# Patient Record
Sex: Female | Born: 1970 | Race: White | Hispanic: No | Marital: Married | Smoking: Former smoker
Health system: Southern US, Community
[De-identification: ages and names within clinical notes are randomized; demographics above are authoritative.]

## PROBLEM LIST (undated history)

## (undated) DIAGNOSIS — K219 Gastro-esophageal reflux disease without esophagitis: Secondary | ICD-10-CM

## (undated) DIAGNOSIS — I10 Essential (primary) hypertension: Secondary | ICD-10-CM

## (undated) DIAGNOSIS — M1990 Unspecified osteoarthritis, unspecified site: Secondary | ICD-10-CM

## (undated) DIAGNOSIS — G4700 Insomnia, unspecified: Secondary | ICD-10-CM

## (undated) DIAGNOSIS — I609 Nontraumatic subarachnoid hemorrhage, unspecified: Secondary | ICD-10-CM

## (undated) DIAGNOSIS — E785 Hyperlipidemia, unspecified: Secondary | ICD-10-CM

## (undated) DIAGNOSIS — G4730 Sleep apnea, unspecified: Secondary | ICD-10-CM

## (undated) DIAGNOSIS — N189 Chronic kidney disease, unspecified: Secondary | ICD-10-CM

## (undated) DIAGNOSIS — Z9889 Other specified postprocedural states: Secondary | ICD-10-CM

## (undated) DIAGNOSIS — F32A Depression, unspecified: Secondary | ICD-10-CM

## (undated) DIAGNOSIS — F329 Major depressive disorder, single episode, unspecified: Secondary | ICD-10-CM

## (undated) DIAGNOSIS — E538 Deficiency of other specified B group vitamins: Secondary | ICD-10-CM

## (undated) DIAGNOSIS — R600 Localized edema: Secondary | ICD-10-CM

## (undated) DIAGNOSIS — C801 Malignant (primary) neoplasm, unspecified: Secondary | ICD-10-CM

## (undated) DIAGNOSIS — F419 Anxiety disorder, unspecified: Secondary | ICD-10-CM

## (undated) DIAGNOSIS — Z973 Presence of spectacles and contact lenses: Secondary | ICD-10-CM

## (undated) DIAGNOSIS — R112 Nausea with vomiting, unspecified: Secondary | ICD-10-CM

## (undated) DIAGNOSIS — L989 Disorder of the skin and subcutaneous tissue, unspecified: Secondary | ICD-10-CM

## (undated) DIAGNOSIS — R51 Headache: Secondary | ICD-10-CM

## (undated) HISTORY — DX: Malignant (primary) neoplasm, unspecified: C80.1

## (undated) HISTORY — DX: Sleep apnea, unspecified: G47.30

---

## 1995-06-17 ENCOUNTER — Encounter: Payer: Self-pay | Admitting: Internal Medicine

## 1998-09-12 ENCOUNTER — Other Ambulatory Visit: Admission: RE | Admit: 1998-09-12 | Discharge: 1998-09-12 | Payer: Self-pay | Admitting: Obstetrics and Gynecology

## 1998-12-21 ENCOUNTER — Encounter: Admission: RE | Admit: 1998-12-21 | Discharge: 1999-03-21 | Payer: Self-pay | Admitting: Gastroenterology

## 1999-09-27 ENCOUNTER — Other Ambulatory Visit: Admission: RE | Admit: 1999-09-27 | Discharge: 1999-09-27 | Payer: Self-pay | Admitting: Obstetrics and Gynecology

## 2000-10-16 ENCOUNTER — Other Ambulatory Visit: Admission: RE | Admit: 2000-10-16 | Discharge: 2000-10-16 | Payer: Self-pay | Admitting: Obstetrics and Gynecology

## 2001-01-21 ENCOUNTER — Encounter: Payer: Self-pay | Admitting: Internal Medicine

## 2002-02-22 ENCOUNTER — Other Ambulatory Visit: Admission: RE | Admit: 2002-02-22 | Discharge: 2002-02-22 | Payer: Self-pay | Admitting: Internal Medicine

## 2002-09-02 HISTORY — PX: LAPAROTOMY: SHX154

## 2003-08-09 ENCOUNTER — Other Ambulatory Visit: Admission: RE | Admit: 2003-08-09 | Discharge: 2003-08-09 | Payer: Self-pay | Admitting: Internal Medicine

## 2004-10-02 ENCOUNTER — Other Ambulatory Visit: Admission: RE | Admit: 2004-10-02 | Discharge: 2004-10-02 | Payer: Self-pay | Admitting: Internal Medicine

## 2004-10-02 ENCOUNTER — Ambulatory Visit: Payer: Self-pay | Admitting: Internal Medicine

## 2005-07-29 ENCOUNTER — Ambulatory Visit: Payer: Self-pay | Admitting: Internal Medicine

## 2005-08-05 ENCOUNTER — Ambulatory Visit: Payer: Self-pay | Admitting: Internal Medicine

## 2005-11-11 ENCOUNTER — Ambulatory Visit: Payer: Self-pay | Admitting: Internal Medicine

## 2005-11-11 ENCOUNTER — Other Ambulatory Visit: Admission: RE | Admit: 2005-11-11 | Discharge: 2005-11-11 | Payer: Self-pay | Admitting: Internal Medicine

## 2005-11-11 ENCOUNTER — Encounter: Payer: Self-pay | Admitting: Internal Medicine

## 2006-04-21 ENCOUNTER — Ambulatory Visit: Payer: Self-pay | Admitting: Family Medicine

## 2007-01-21 ENCOUNTER — Ambulatory Visit: Payer: Self-pay | Admitting: Internal Medicine

## 2007-01-21 LAB — CONVERTED CEMR LAB
ALT: 26 units/L (ref 0–40)
AST: 28 units/L (ref 0–37)
Albumin: 3.7 g/dL (ref 3.5–5.2)
Alkaline Phosphatase: 70 units/L (ref 39–117)
BUN: 9 mg/dL (ref 6–23)
Basophils Absolute: 0 10*3/uL (ref 0.0–0.1)
Basophils Relative: 0.1 % (ref 0.0–1.0)
Bilirubin, Direct: 0.1 mg/dL (ref 0.0–0.3)
CO2: 31 meq/L (ref 19–32)
Calcium: 9.8 mg/dL (ref 8.4–10.5)
Chloride: 105 meq/L (ref 96–112)
Cholesterol: 271 mg/dL (ref 0–200)
Creatinine, Ser: 0.9 mg/dL (ref 0.4–1.2)
Direct LDL: 175.2 mg/dL
Eosinophils Absolute: 0.2 10*3/uL (ref 0.0–0.6)
Eosinophils Relative: 2.1 % (ref 0.0–5.0)
GFR calc Af Amer: 93 mL/min
GFR calc non Af Amer: 77 mL/min
Glucose, Bld: 95 mg/dL (ref 70–99)
HCT: 36.2 % (ref 36.0–46.0)
HDL: 58 mg/dL (ref >39.0)
Hemoglobin: 12.5 g/dL (ref 12.0–15.0)
Lymphocytes Relative: 26.8 % (ref 12.0–46.0)
MCHC: 34.5 g/dL (ref 30.0–36.0)
MCV: 89.7 fL (ref 78.0–100.0)
Monocytes Absolute: 0.6 10*3/uL (ref 0.2–0.7)
Monocytes Relative: 6.8 % (ref 3.0–11.0)
Neutro Abs: 5.3 10*3/uL (ref 1.4–7.7)
Neutrophils Relative %: 64.2 % (ref 43.0–77.0)
Platelets: 366 10*3/uL (ref 150–400)
Potassium: 3.9 meq/L (ref 3.5–5.1)
RBC: 4.04 M/uL (ref 3.87–5.11)
RDW: 11.2 % — ABNORMAL LOW (ref 11.5–14.6)
Sodium: 142 meq/L (ref 135–145)
TSH: 1.33 microintl units/mL (ref 0.35–5.50)
Total Bilirubin: 0.8 mg/dL (ref 0.3–1.2)
Total CHOL/HDL Ratio: 4.7
Total Protein: 7.1 g/dL (ref 6.0–8.3)
Triglycerides: 133 mg/dL (ref 0–149)
VLDL: 27 mg/dL (ref 0–40)
WBC: 8.4 10*3/uL (ref 4.5–10.5)

## 2007-02-03 ENCOUNTER — Ambulatory Visit: Payer: Self-pay | Admitting: Internal Medicine

## 2007-02-06 DIAGNOSIS — E785 Hyperlipidemia, unspecified: Secondary | ICD-10-CM | POA: Insufficient documentation

## 2007-02-06 DIAGNOSIS — I10 Essential (primary) hypertension: Secondary | ICD-10-CM | POA: Insufficient documentation

## 2007-04-01 ENCOUNTER — Ambulatory Visit: Payer: Self-pay | Admitting: Internal Medicine

## 2007-04-01 DIAGNOSIS — F32A Depression, unspecified: Secondary | ICD-10-CM | POA: Insufficient documentation

## 2007-04-01 DIAGNOSIS — F329 Major depressive disorder, single episode, unspecified: Secondary | ICD-10-CM | POA: Insufficient documentation

## 2007-04-21 ENCOUNTER — Telehealth (INDEPENDENT_AMBULATORY_CARE_PROVIDER_SITE_OTHER): Payer: Self-pay | Admitting: *Deleted

## 2007-04-24 ENCOUNTER — Ambulatory Visit: Payer: Self-pay | Admitting: Internal Medicine

## 2007-04-24 LAB — CONVERTED CEMR LAB
Bilirubin Urine: NEGATIVE
Blood in Urine, dipstick: NEGATIVE
Glucose, Urine, Semiquant: NEGATIVE
Ketones, urine, test strip: NEGATIVE
Nitrite: NEGATIVE
Protein, U semiquant: NEGATIVE
Specific Gravity, Urine: 1.01
Urobilinogen, UA: 0.2
WBC Urine, dipstick: NEGATIVE
pH: 7.5

## 2007-04-29 ENCOUNTER — Telehealth: Payer: Self-pay | Admitting: Internal Medicine

## 2007-05-06 ENCOUNTER — Ambulatory Visit: Payer: Self-pay | Admitting: Internal Medicine

## 2007-05-06 LAB — CONVERTED CEMR LAB
ALT: 28 units/L (ref 0–35)
AST: 24 units/L (ref 0–37)
Albumin: 3.4 g/dL — ABNORMAL LOW (ref 3.5–5.2)
Alkaline Phosphatase: 128 units/L — ABNORMAL HIGH (ref 39–117)
Bilirubin, Direct: 0.1 mg/dL (ref 0.0–0.3)
Cholesterol: 256 mg/dL (ref 0–200)
Direct LDL: 155.1 mg/dL
HDL: 57.6 mg/dL (ref >39.0)
Total Bilirubin: 0.5 mg/dL (ref 0.3–1.2)
Total CHOL/HDL Ratio: 4.4
Total Protein: 7.4 g/dL (ref 6.0–8.3)
Triglycerides: 306 mg/dL (ref 0–149)
VLDL: 61 mg/dL — ABNORMAL HIGH (ref 0–40)

## 2007-05-13 ENCOUNTER — Ambulatory Visit: Payer: Self-pay | Admitting: Internal Medicine

## 2007-06-11 ENCOUNTER — Ambulatory Visit: Payer: Self-pay | Admitting: Internal Medicine

## 2007-06-30 ENCOUNTER — Telehealth: Payer: Self-pay | Admitting: Internal Medicine

## 2007-07-01 ENCOUNTER — Telehealth: Payer: Self-pay | Admitting: Internal Medicine

## 2007-08-06 ENCOUNTER — Ambulatory Visit: Payer: Self-pay | Admitting: Internal Medicine

## 2007-08-06 LAB — CONVERTED CEMR LAB
ALT: 20 units/L (ref 0–35)
AST: 25 units/L (ref 0–37)
Albumin: 3.4 g/dL — ABNORMAL LOW (ref 3.5–5.2)
Alkaline Phosphatase: 94 units/L (ref 39–117)
Bilirubin, Direct: 0.1 mg/dL (ref 0.0–0.3)
Cholesterol: 240 mg/dL (ref 0–200)
Direct LDL: 156.1 mg/dL
HDL: 60.1 mg/dL (ref >39.0)
Total Bilirubin: 0.5 mg/dL (ref 0.3–1.2)
Total CHOL/HDL Ratio: 4
Total Protein: 6.9 g/dL (ref 6.0–8.3)
Triglycerides: 184 mg/dL — ABNORMAL HIGH (ref 0–149)
VLDL: 37 mg/dL (ref 0–40)

## 2007-08-18 ENCOUNTER — Ambulatory Visit: Payer: Self-pay | Admitting: Internal Medicine

## 2007-09-01 ENCOUNTER — Ambulatory Visit: Payer: Self-pay | Admitting: Internal Medicine

## 2007-09-03 HISTORY — PX: DILATION AND CURETTAGE OF UTERUS: SHX78

## 2007-10-19 ENCOUNTER — Ambulatory Visit: Payer: Self-pay | Admitting: Internal Medicine

## 2007-10-19 LAB — CONVERTED CEMR LAB
ALT: 35 units/L (ref 0–35)
AST: 28 units/L (ref 0–37)
Albumin: 4 g/dL (ref 3.5–5.2)
Alkaline Phosphatase: 77 units/L (ref 39–117)
Bilirubin, Direct: 0.1 mg/dL (ref 0.0–0.3)
Cholesterol: 175 mg/dL (ref 0–200)
HDL: 50.4 mg/dL (ref >39.0)
LDL Cholesterol: 102 mg/dL — ABNORMAL HIGH (ref 0–99)
Total Bilirubin: 0.6 mg/dL (ref 0.3–1.2)
Total CHOL/HDL Ratio: 3.5
Total Protein: 7.2 g/dL (ref 6.0–8.3)
Triglycerides: 111 mg/dL (ref 0–149)
VLDL: 22 mg/dL (ref 0–40)

## 2007-11-06 ENCOUNTER — Ambulatory Visit: Payer: Self-pay | Admitting: Internal Medicine

## 2008-03-07 ENCOUNTER — Ambulatory Visit: Payer: Self-pay | Admitting: Internal Medicine

## 2008-03-07 LAB — CONVERTED CEMR LAB
ALT: 53 units/L — ABNORMAL HIGH (ref 0–35)
AST: 39 units/L — ABNORMAL HIGH (ref 0–37)
Albumin: 3.8 g/dL (ref 3.5–5.2)
Alkaline Phosphatase: 99 units/L (ref 39–117)
BUN: 8 mg/dL (ref 6–23)
Basophils Absolute: 0 10*3/uL (ref 0.0–0.1)
Basophils Relative: 0.2 % (ref 0.0–1.0)
Bilirubin Urine: NEGATIVE
Bilirubin, Direct: 0.1 mg/dL (ref 0.0–0.3)
CO2: 29 meq/L (ref 19–32)
Calcium: 9.4 mg/dL (ref 8.4–10.5)
Chloride: 104 meq/L (ref 96–112)
Cholesterol: 147 mg/dL (ref 0–200)
Creatinine, Ser: 0.9 mg/dL (ref 0.4–1.2)
Eosinophils Absolute: 0.1 10*3/uL (ref 0.0–0.7)
Eosinophils Relative: 1.7 % (ref 0.0–5.0)
GFR calc Af Amer: 92 mL/min
GFR calc non Af Amer: 76 mL/min
Glucose, Bld: 97 mg/dL (ref 70–99)
Glucose, Urine, Semiquant: NEGATIVE
HCT: 37.9 % (ref 36.0–46.0)
HDL: 47.7 mg/dL (ref >39.0)
Hemoglobin: 13 g/dL (ref 12.0–15.0)
LDL Cholesterol: 81 mg/dL (ref 0–99)
Lymphocytes Relative: 21.6 % (ref 12.0–46.0)
MCHC: 34.4 g/dL (ref 30.0–36.0)
MCV: 89 fL (ref 78.0–100.0)
Monocytes Absolute: 0.4 10*3/uL (ref 0.1–1.0)
Monocytes Relative: 5.1 % (ref 3.0–12.0)
Neutro Abs: 5.2 10*3/uL (ref 1.4–7.7)
Neutrophils Relative %: 71.4 % (ref 43.0–77.0)
Nitrite: NEGATIVE
Platelets: 339 10*3/uL (ref 150–400)
Potassium: 4.1 meq/L (ref 3.5–5.1)
RBC: 4.26 M/uL (ref 3.87–5.11)
RDW: 12.5 % (ref 11.5–14.6)
Sodium: 141 meq/L (ref 135–145)
Specific Gravity, Urine: 1.025
TSH: 2.83 microintl units/mL (ref 0.35–5.50)
Total Bilirubin: 0.6 mg/dL (ref 0.3–1.2)
Total CHOL/HDL Ratio: 3.1
Total Protein: 6.9 g/dL (ref 6.0–8.3)
Triglycerides: 90 mg/dL (ref 0–149)
Urobilinogen, UA: 0.2
VLDL: 18 mg/dL (ref 0–40)
WBC Urine, dipstick: NEGATIVE
WBC: 7.3 10*3/uL (ref 4.5–10.5)
pH: 6

## 2008-03-14 ENCOUNTER — Encounter: Payer: Self-pay | Admitting: Internal Medicine

## 2008-03-14 ENCOUNTER — Other Ambulatory Visit: Admission: RE | Admit: 2008-03-14 | Discharge: 2008-03-14 | Payer: Self-pay | Admitting: Internal Medicine

## 2008-03-14 ENCOUNTER — Ambulatory Visit: Payer: Self-pay | Admitting: Internal Medicine

## 2008-04-13 ENCOUNTER — Telehealth: Payer: Self-pay | Admitting: Internal Medicine

## 2008-04-19 ENCOUNTER — Ambulatory Visit: Payer: Self-pay | Admitting: Internal Medicine

## 2008-04-22 ENCOUNTER — Ambulatory Visit (HOSPITAL_COMMUNITY): Admission: RE | Admit: 2008-04-22 | Discharge: 2008-04-22 | Payer: Self-pay | Admitting: Obstetrics and Gynecology

## 2008-04-22 ENCOUNTER — Encounter (INDEPENDENT_AMBULATORY_CARE_PROVIDER_SITE_OTHER): Payer: Self-pay | Admitting: Obstetrics and Gynecology

## 2008-04-22 ENCOUNTER — Encounter: Payer: Self-pay | Admitting: Internal Medicine

## 2008-05-03 ENCOUNTER — Telehealth: Payer: Self-pay | Admitting: Internal Medicine

## 2008-06-09 ENCOUNTER — Ambulatory Visit: Payer: Self-pay | Admitting: Internal Medicine

## 2008-09-20 ENCOUNTER — Ambulatory Visit: Payer: Self-pay | Admitting: Internal Medicine

## 2008-09-20 LAB — CONVERTED CEMR LAB
ALT: 29 units/L (ref 0–35)
AST: 29 units/L (ref 0–37)
Albumin: 3.7 g/dL (ref 3.5–5.2)
Alkaline Phosphatase: 92 units/L (ref 39–117)
Bilirubin, Direct: 0.1 mg/dL (ref 0.0–0.3)
Cholesterol: 207 mg/dL (ref 0–200)
Direct LDL: 128.6 mg/dL
HDL: 55.6 mg/dL (ref >39.0)
Total Bilirubin: 0.9 mg/dL (ref 0.3–1.2)
Total CHOL/HDL Ratio: 3.7
Total Protein: 7 g/dL (ref 6.0–8.3)
Triglycerides: 122 mg/dL (ref 0–149)
VLDL: 24 mg/dL (ref 0–40)

## 2008-09-28 ENCOUNTER — Ambulatory Visit: Payer: Self-pay | Admitting: Internal Medicine

## 2009-01-11 ENCOUNTER — Ambulatory Visit: Payer: Self-pay | Admitting: Family Medicine

## 2009-02-28 ENCOUNTER — Telehealth: Payer: Self-pay | Admitting: Internal Medicine

## 2009-04-17 ENCOUNTER — Ambulatory Visit: Payer: Self-pay | Admitting: Internal Medicine

## 2009-04-17 LAB — CONVERTED CEMR LAB
ALT: 25 units/L (ref 0–35)
AST: 34 units/L (ref 0–37)
Albumin: 3.6 g/dL (ref 3.5–5.2)
Alkaline Phosphatase: 82 units/L (ref 39–117)
BUN: 11 mg/dL (ref 6–23)
Basophils Absolute: 0 10*3/uL (ref 0.0–0.1)
Basophils Relative: 0.1 % (ref 0.0–3.0)
Bilirubin Urine: NEGATIVE
Bilirubin, Direct: 0.1 mg/dL (ref 0.0–0.3)
Blood in Urine, dipstick: NEGATIVE
CO2: 28 meq/L (ref 19–32)
Calcium: 9.6 mg/dL (ref 8.4–10.5)
Chloride: 106 meq/L (ref 96–112)
Cholesterol: 198 mg/dL (ref 0–200)
Creatinine, Ser: 0.9 mg/dL (ref 0.4–1.2)
Eosinophils Absolute: 0.1 10*3/uL (ref 0.0–0.7)
Eosinophils Relative: 0.8 % (ref 0.0–5.0)
GFR calc non Af Amer: 75.44 mL/min (ref >60)
Glucose, Bld: 109 mg/dL — ABNORMAL HIGH (ref 70–99)
Glucose, Urine, Semiquant: NEGATIVE
HCT: 39.1 % (ref 36.0–46.0)
HDL: 58.7 mg/dL (ref >39.00)
Hemoglobin: 13.3 g/dL (ref 12.0–15.0)
Ketones, urine, test strip: NEGATIVE
LDL Cholesterol: 116 mg/dL — ABNORMAL HIGH (ref 0–99)
Lymphocytes Relative: 21.8 % (ref 12.0–46.0)
Lymphs Abs: 1.8 10*3/uL (ref 0.7–4.0)
MCHC: 33.9 g/dL (ref 30.0–36.0)
MCV: 93.7 fL (ref 78.0–100.0)
Monocytes Absolute: 0.4 10*3/uL (ref 0.1–1.0)
Monocytes Relative: 5.2 % (ref 3.0–12.0)
Neutro Abs: 6.1 10*3/uL (ref 1.4–7.7)
Neutrophils Relative %: 72.1 % (ref 43.0–77.0)
Nitrite: NEGATIVE
Platelets: 292 10*3/uL (ref 150.0–400.0)
Potassium: 5.2 meq/L — ABNORMAL HIGH (ref 3.5–5.1)
Protein, U semiquant: NEGATIVE
RBC: 4.17 M/uL (ref 3.87–5.11)
RDW: 11.1 % — ABNORMAL LOW (ref 11.5–14.6)
Sodium: 140 meq/L (ref 135–145)
Specific Gravity, Urine: 1.015
TSH: 2.41 microintl units/mL (ref 0.35–5.50)
Total Bilirubin: 0.7 mg/dL (ref 0.3–1.2)
Total CHOL/HDL Ratio: 3
Total Protein: 7.2 g/dL (ref 6.0–8.3)
Triglycerides: 119 mg/dL (ref 0.0–149.0)
Urobilinogen, UA: 0.2
VLDL: 23.8 mg/dL (ref 0.0–40.0)
WBC Urine, dipstick: NEGATIVE
WBC: 8.4 10*3/uL (ref 4.5–10.5)
pH: 6.5

## 2009-04-21 ENCOUNTER — Ambulatory Visit: Payer: Self-pay | Admitting: Internal Medicine

## 2009-08-07 ENCOUNTER — Encounter (INDEPENDENT_AMBULATORY_CARE_PROVIDER_SITE_OTHER): Payer: Self-pay | Admitting: *Deleted

## 2009-08-07 ENCOUNTER — Encounter: Payer: Self-pay | Admitting: Internal Medicine

## 2009-08-24 ENCOUNTER — Encounter: Payer: Self-pay | Admitting: Internal Medicine

## 2010-01-16 ENCOUNTER — Ambulatory Visit: Payer: Self-pay | Admitting: Internal Medicine

## 2010-02-13 ENCOUNTER — Ambulatory Visit: Payer: Self-pay | Admitting: Internal Medicine

## 2010-03-02 ENCOUNTER — Telehealth: Payer: Self-pay | Admitting: Internal Medicine

## 2010-04-10 ENCOUNTER — Encounter: Payer: Self-pay | Admitting: Internal Medicine

## 2010-04-18 ENCOUNTER — Ambulatory Visit: Payer: Self-pay | Admitting: Internal Medicine

## 2010-04-18 LAB — CONVERTED CEMR LAB
ALT: 21 units/L (ref 0–35)
AST: 23 units/L (ref 0–37)
Albumin: 3.8 g/dL (ref 3.5–5.2)
Alkaline Phosphatase: 95 units/L (ref 39–117)
BUN: 14 mg/dL (ref 6–23)
Basophils Absolute: 0 10*3/uL (ref 0.0–0.1)
Basophils Relative: 0.4 % (ref 0.0–3.0)
Bilirubin Urine: NEGATIVE
Bilirubin, Direct: 0.1 mg/dL (ref 0.0–0.3)
CO2: 26 meq/L (ref 19–32)
Calcium: 9.2 mg/dL (ref 8.4–10.5)
Chloride: 103 meq/L (ref 96–112)
Cholesterol: 187 mg/dL (ref 0–200)
Creatinine, Ser: 0.9 mg/dL (ref 0.4–1.2)
Eosinophils Absolute: 0.1 10*3/uL (ref 0.0–0.7)
Eosinophils Relative: 0.8 % (ref 0.0–5.0)
GFR calc non Af Amer: 80.13 mL/min (ref >60)
Glucose, Bld: 88 mg/dL (ref 70–99)
Glucose, Urine, Semiquant: NEGATIVE
HCT: 37 % (ref 36.0–46.0)
HDL: 57.7 mg/dL (ref >39.00)
Hemoglobin: 12.9 g/dL (ref 12.0–15.0)
Ketones, urine, test strip: NEGATIVE
LDL Cholesterol: 102 mg/dL — ABNORMAL HIGH (ref 0–99)
Lymphocytes Relative: 20 % (ref 12.0–46.0)
Lymphs Abs: 2 10*3/uL (ref 0.7–4.0)
MCHC: 34.7 g/dL (ref 30.0–36.0)
MCV: 91.3 fL (ref 78.0–100.0)
Monocytes Absolute: 0.4 10*3/uL (ref 0.1–1.0)
Monocytes Relative: 3.7 % (ref 3.0–12.0)
Neutro Abs: 7.5 10*3/uL (ref 1.4–7.7)
Neutrophils Relative %: 75.1 % (ref 43.0–77.0)
Nitrite: NEGATIVE
Platelets: 325 10*3/uL (ref 150.0–400.0)
Potassium: 3.7 meq/L (ref 3.5–5.1)
Protein, U semiquant: NEGATIVE
RBC: 4.06 M/uL (ref 3.87–5.11)
RDW: 12.6 % (ref 11.5–14.6)
Sodium: 140 meq/L (ref 135–145)
Specific Gravity, Urine: 1.02
TSH: 2.57 microintl units/mL (ref 0.35–5.50)
Total Bilirubin: 0.5 mg/dL (ref 0.3–1.2)
Total CHOL/HDL Ratio: 3
Total Protein: 6.8 g/dL (ref 6.0–8.3)
Triglycerides: 136 mg/dL (ref 0.0–149.0)
Urobilinogen, UA: 0.2
VLDL: 27.2 mg/dL (ref 0.0–40.0)
WBC: 10 10*3/uL (ref 4.5–10.5)
pH: 6

## 2010-04-25 ENCOUNTER — Ambulatory Visit: Payer: Self-pay | Admitting: Internal Medicine

## 2010-10-04 NOTE — Assessment & Plan Note (Signed)
Summary: CPX/NO PAP/NJR   Vital Signs:  Patient profile:   40 year old female Menstrual status:  regular LMP:     04/25/2010 Height:      65.5 inches Weight:      214 pounds BMI:     35.20 Pulse rate:   60 / minute Pulse rhythm:   regular Resp:     12 per minute BP sitting:   118 / 80  (left arm) Cuff size:   regular  Vitals Entered By: Gladis Riffle, RN (April 25, 2010 10:13 AM) CC: cpx, no pap--has gyn, labs done Is Patient Diabetic? No LMP (date): 04/25/2010     Menstrual Status regular Enter LMP: 04/25/2010   CC:  cpx, no pap--has gyn, and labs done.  History of Present Illness: CPX  Preventive Screening-Counseling & Management  Alcohol-Tobacco     Smoking Status: never  Current Medications (verified): 1)  Sertraline Hcl 100 Mg Tabs (Sertraline Hcl) .... Take 1 Tablet By Mouth Once A Day 2)  Amlodipine Besy-Benazepril Hcl 5-10 Mg Caps (Amlodipine Besy-Benazepril Hcl) .... One By Mouth Daily 3)  Lipitor 80 Mg Tabs (Atorvastatin Calcium) .... Take 1 Tab By Mouth Daily or As Directed 4)  Apri 0.15-30 Mg-Mcg Tabs (Desogestrel-Ethinyl Estradiol) .... Take 1 Tablet By Mouth Once A Day 5)  Alprazolam 0.25 Mg Tabs (Alprazolam) .... Every Other Day Tab As Needed Anxiety  Allergies (verified): No Known Drug Allergies  Social History: Occupation: Single--engaged Regular exercise-yes  Physical Exam  General:  alert and well-developed.   Head:  normocephalic and atraumatic.   Eyes:  pupils equal and pupils round.   Ears:  R ear normal and L ear normal.   Neck:  supple no adenopathy Chest Wall:  No deformities, masses, or tenderness noted. Lungs:  Normal respiratory effort, chest expands symmetrically. Lungs are clear to auscultation, no crackles or wheezes. Heart:  normal rate and regular rhythm.   Abdomen:  Bowel sounds positive,abdomen soft and non-tender without masses, organomegaly or hernias noted. Msk:  No deformity or scoliosis noted of thoracic or lumbar  spine.   Pulses:  R and L carotid,radial,femoral,dorsalis pedis and posterior tibial pulses are full and equal bilaterally Extremities:  No clubbing, cyanosis, edema, or deformity noted  Neurologic:  alert & oriented X3, cranial nerves II-XII intact, and gait normal.   Skin:  turgor normal and color normal.   Cervical Nodes:  no anterior cervical adenopathy and no posterior cervical adenopathy.   Psych:  normally interactive and good eye contact.     Impression & Recommendations:  Problem # 1:  PREVENTIVE HEALTH CARE (ICD-V70.0) health maint UTD advised weight loss  Problem # 2:  DEPRESSION (ICD-311) she admits to being more anxious increas zoloft side effects discussed Her updated medication list for this problem includes:    Sertraline Hcl 100 Mg Tabs (Sertraline hcl) .Marland Kitchen... 1 and 1/2 by mouth once daily    Alprazolam 0.25 Mg Tabs (Alprazolam) .Marland Kitchen... Take one tablet by mouth every other day as needed anxiety  Complete Medication List: 1)  Sertraline Hcl 100 Mg Tabs (Sertraline hcl) .Marland Kitchen.. 1 and 1/2 by mouth once daily 2)  Amlodipine Besy-benazepril Hcl 5-10 Mg Caps (Amlodipine besy-benazepril hcl) .... One by mouth daily 3)  Lipitor 80 Mg Tabs (Atorvastatin calcium) .... Take 1 tab by mouth daily or as directed 4)  Apri 0.15-30 Mg-mcg Tabs (Desogestrel-ethinyl estradiol) .... Take 1 tablet by mouth once a day 5)  Alprazolam 0.25 Mg Tabs (Alprazolam) .... Take one tablet  by mouth every other day as needed anxiety Prescriptions: ALPRAZOLAM 0.25 MG TABS (ALPRAZOLAM) Take one tablet by mouth every other day as needed anxiety  #30 x 1   Entered and Authorized by:   Birdie Sons MD   Signed by:   Birdie Sons MD on 04/25/2010   Method used:   Print then Give to Patient   RxID:   [id number] SERTRALINE HCL 100 MG TABS (SERTRALINE HCL) 1 and 1/2 by mouth once daily  #135 x 3   Entered and Authorized by:   Birdie Sons MD   Signed by:   Birdie Sons MD on 04/25/2010   Method used:    [facility] to        [facility]  [address] [phone][facility] (retail[facility]       [address] [place], [place]  [address]       Ph: [phone] or [phone]       Fax: [phone]   RxID:   [id number]

## 2010-10-04 NOTE — Assessment & Plan Note (Signed)
Summary: Yeast Infection questions Intestinal Infection questions/nn   Vital Signs:  Patient Profile:   40 Years Old Female Height:     65.75 inches Weight:      205 pounds Temp:     98.4 degrees F Pulse rate:   56 / minute Resp:     14 per minute  Vitals Entered By: Gladis Riffle, RN (April 19, 2008 10:29 AM)                 Chief Complaint:  c/o increased brownish vaginal discharge with cramping left lower abdomen since 8/10--was using metrogel with no relief so stopped--LMP 3 weeks ago normal and has been having clots at night since--also BP 130/90 at home so increased atenolol to one qd .  History of Present Illness: 10 days ago developed vaginal discharge with scant amount of blood---used over the counter monistat meds---3 days--no relief---called and Rx for metrogel called in-- no relief. In addition had intercourse one day last week---mild discomfort followed by bleeding/clots---bleeding has persisted minimally at night. Has some Left sided lower quadrant discomfort. ---started BCPs about 3 weeks ago---no known side effects except for above  Past Medical History: GERD Hyperlipidemia Hypertension Depression  Past Surgical History: open laparotomy  Social History: Occupation: Single Regular exercise-yes  Family History: Family History of CAD Female 1st degree relative--mother Family History Diabetes 1st degree relative father, sister, mother Family History High cholesterol Family History Hypertension Father: alive, HTN, DMII, hyperlipidemia Mother: alive--HTN, MI, CAD, hyperlipidemia, ?anemia, lupus Siblings:0 brothers, 1 sister--DMII, hyperlipidemia   no other complaints in a complete ROS     Updated Prior Medication List: SERTRALINE HCL 100 MG TABS (SERTRALINE HCL) Take 1 tablet by mouth once a day ATENOLOL 25 MG  TABS (ATENOLOL) 1 by mouth once daily LIPITOR 80 MG TABS (ATORVASTATIN CALCIUM) Take 1 tab by mouth daily or as directed RECLIPSEN 0.15-30 MG-MCG  TABS  (DESOGESTREL-ETHINYL ESTRADIOL) Take 1 tablet by mouth once a day  Current Allergies (reviewed today): * NO KNOWN DRUG ALLERGIES GROUP      Physical Exam  General:     Well-developed,well-nourished,in no acute distress; alert,appropriate and cooperative throughout examination Abdomen:     Bowel sounds positive,abdomen soft and non-tender without masses, organomegaly or hernias noted. Genitalia:     clear vaginal discharge. normal introitus, no external lesions, and mucosa pink and moist.   the cervical os demonstrates a large irregular shaped mass. Measuring approximately 4 x 2 cm.    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 She will call for any increased symptoms. Discontinue birth control pills. Will refer to gynecology. Orders: Gynecologic Referral (Gyn)   Complete Medication List: 1)  Sertraline Hcl 100 Mg Tabs (Sertraline hcl) .... Take 1 tablet by mouth once a day 2)  Atenolol 25 Mg Tabs (Atenolol) .Marland Kitchen.. 1 by mouth once daily 3)  Lipitor 80 Mg Tabs (Atorvastatin calcium) .... Take 1 tab by mouth daily or as directed 4)  Reclipsen 0.15-30 Mg-mcg Tabs (Desogestrel-ethinyl estradiol) .... Take 1 tablet by mouth once a day    ]  Appended Document: Yeast Infection questions Intestinal Infection questions/nn

## 2010-10-04 NOTE — Progress Notes (Signed)
Summary: Vaginal discharge, OV?  Phone Note Call from Patient Call back at [phone] (cell)   Caller: Patient triage VM Call For: Swords/[name] Summary of Call: Pt saw Dr Cato Mulligan on 7/13, pap showed atypical cells, OV scheduled in 3 mo for repeat pap.  Pt C/O sx of heavy discharge.  Pt has tried OTC yeast infection meds with no relief.   LMTCB with additional information re: discharge  OV for evaluation?  Initial call taken by: Sid Falcon LPN,  April 13, 2008 10:32 AM  Follow-up for Phone Call        if pap did not show yeast would order metrogel vaginal cream two times a day for 7 d. Follow-up by: Stacie Glaze MD,  April 13, 2008 11:21 AM  Additional Follow-up for Phone Call Additional follow up Details #1::        No yeast present in pap Rx sent electronically, pt informed. Additional Follow-up by: Sid Falcon LPN,  April 13, 2008 11:41 AM    New/Updated Medications: METROGEL-VAGINAL 0.75 %  GEL (METRONIDAZOLE) use vaginal cream two times a day X 7 days   Prescriptions: METROGEL-VAGINAL 0.75 %  GEL (METRONIDAZOLE) use vaginal cream two times a day X 7 days  #70g x 0   Entered by:   Sid Falcon LPN   Authorized by:   Stacie Glaze MD   Signed by:   Sid Falcon LPN on 41/32/4401   Method used:   [facility] to ...       [facility]  [address] [id number]       [address]       [place], [place]  [address]       Ph: [phone] or [phone]       Fax: [phone]   RxID:   [id number]

## 2010-10-04 NOTE — Progress Notes (Signed)
Summary: change of BCP  Phone Note From Pharmacy   Call For: Meric Joye  Summary of Call: request to refill apri Initial call taken by: Gladis Riffle, RN,  February 28, 2009 9:10 AM  Follow-up for Phone Call        Our list states reclipsen for birth control.  She is due cpx, pap in July.  OK to refill apri one month in palce of reclipsen? Follow-up by: Gladis Riffle, RN,  February 28, 2009 9:11 AM  Additional Follow-up for Phone Call Additional follow up Details #1::        ok per dr Aloysius Heinle Additional Follow-up by: Gladis Riffle, RN,  February 28, 2009 11:32 AM

## 2010-10-04 NOTE — Progress Notes (Signed)
Summary: call back to confirm dose.   Phone Note From Pharmacy Call back at [phone]   [facility]  [address] [phone]* Call For: [name]  Summary of Call: simvustatin 40 mg  patient says she should be taking 2 a day and the sig says 1 a day  please clarify Initial call taken by: Roselle Locus,  June 30, 2007 8:15 AM  Follow-up for Phone Call        Dr. Cato Mulligan:  The medication list says Simvastatin 40 mg once daily.  Correct? Follow-up by: Rudy Jew, RN,  June 30, 2007 8:25 AM  Additional Follow-up for Phone Call Additional follow up Details #1::        2 by mouth once daily  Additional Follow-up by: Birdie Sons MD,  June 30, 2007 2:03 PM    Additional Follow-up for Phone Call Additional follow up Details #2::    Left message on pharmacy voice mail that pt is to take Simvastin 40 mg. 2 x daily v/o Dr. Cato Mulligan. Also left a message on pt's voice mail to call us and confirm this dose. Follow-up by: Lynann Beaver CMA,  June 30, 2007 4:16 PM

## 2010-10-04 NOTE — Assessment & Plan Note (Signed)
Summary: cpx and pap/nta   Vital Signs:  Patient Profile:   40 Years Old Female Height:     65.75 inches Weight:      207 pounds Pulse rate:   60 / minute BP sitting:   116 / 88  (left arm)  Vitals Entered By: Gladis Riffle, RN (March 14, 2008 8:54 AM)                 Chief Complaint:  cpx with pap and labs done--c/o irregular menses since off BCP in Jan--discuss right knee.  History of Present Illness: cpx     Prior Medication List:  SERTRALINE HCL 100 MG TABS (SERTRALINE HCL) Take 1 tablet by mouth once a day ATENOLOL 25 MG  TABS (ATENOLOL) 1/2 by mouth once daily LIPITOR 80 MG TABS (ATORVASTATIN CALCIUM) Take 1 tab by mouth daily or as directed   Current Allergies (reviewed today): * NO KNOWN DRUG ALLERGIES GROUP  Past Medical History:    Reviewed history from 04/01/2007 and no changes required:       GERD       Hyperlipidemia       Hypertension       Depression  Past Surgical History:    Reviewed history from 02/06/2007 and no changes required:       open laparotomy   Family History:    Reviewed history and no changes required:       Family History of CAD Female 1st degree relative--mother       Family History Diabetes 1st degree relative father, sister, mother       Family History High cholesterol       Family History Hypertension       Father: alive, HTN, DMII, hyperlipidemia       Mother: alive--HTN, MI, CAD, hyperlipidemia, ?anemia, lupus       Siblings:0 brothers, 1 sister--DMII, hyperlipidemia   Social History:    Reviewed history from 04/24/2007 and no changes required:       Occupation:       Single       Regular exercise-yes    Review of Systems       no other complaints in a complete ROS      Impression & Recommendations:  Problem # 1:  PREVENTIVE HEALTH CARE (ICD-V70.0) she has had irregular periods since stopping BCPs approx 6 months ago  Problem # 2:  HYPERTENSION (ICD-401.9) well controlled Her updated medication list for this  problem includes:    Atenolol 25 Mg Tabs (Atenolol) .Marland Kitchen... 1/2 by mouth once daily  BP today: 116/88 Prior BP: 138/84 (11/06/2007)  Prior 10 Yr Risk Heart Disease: Not enough information (09/01/2007)  Labs Reviewed: Creat: 0.9 (03/07/2008) Chol: 147 (03/07/2008)   HDL: 47.7 (03/07/2008)   LDL: 81 (03/07/2008)   TG: 90 (03/07/2008)   Problem # 3:  HYPERLIPIDEMIA (ICD-272.4) well controlled Her updated medication list for this problem includes:    Lipitor 80 Mg Tabs (Atorvastatin calcium) .Marland Kitchen... Take 1 tab by mouth daily or as directed--she take 1/2 daily  Labs Reviewed: Chol: 147 (03/07/2008)   HDL: 47.7 (03/07/2008)   LDL: 81 (03/07/2008)   TG: 90 (03/07/2008) SGOT: 39 (03/07/2008)   SGPT: 53 (03/07/2008)  Prior 10 Yr Risk Heart Disease: Not enough information (09/01/2007)  Her updated medication list for this problem includes:    Lipitor 80 Mg Tabs (Atorvastatin calcium) .Marland Kitchen... Take 1 tab by mouth daily or as directed   Problem # 4:  GERD (ICD-530.81)  currently doing well on no meds  Problem # 5:  KNEE PAIN, RIGHT (ICD-719.46)  Orders: T-Knee Comp Right 4 Views ([id number])   Complete Medication List: 1)  Sertraline Hcl 100 Mg Tabs (Sertraline hcl) .... Take 1 tablet by mouth once a day 2)  Atenolol 25 Mg Tabs (Atenolol) .... 1/2 by mouth once daily 3)  Lipitor 80 Mg Tabs (Atorvastatin calcium) .... Take 1 tab by mouth daily or as directed 4)  Reclipsen 0.15-30 Mg-mcg Tabs (Desogestrel-ethinyl estradiol) .... Take 1 tablet by mouth once a day   Patient Instructions: 1)  Please schedule a follow-up appointment in 6 months. 2)  lipids 272.4 3)  liver 995.2   Prescriptions: RECLIPSEN 0.15-30 MG-MCG  TABS (DESOGESTREL-ETHINYL ESTRADIOL) Take 1 tablet by mouth once a day  #3 packs x 3   Entered and Authorized by:   Birdie Sons MD   Signed by:   Birdie Sons MD on 03/14/2008   Method used:   Electronically [facility] to ...       [facility]  [address] [id number]       [address]       [place], [place]  [address]       Ph: [phone] or [phone]       Fax: [phone]   RxID:   [id number]  ]Physical Exam General Appearance: well developed, well nourished, no acute distress Eyes: conjunctiva and lids normal, PERRL, EOMI, Ears, Nose, Mouth, Throat: TM clear, nares clear, oral exam WNL Neck: supple, no lymphadenopathy, no thyromegaly, no JVD Respiratory: clear to auscultation and percussion, respiratory effort normal Cardiovascular: regular rate and rhythm, S1-S2, no murmur, rub or gallop, no bruits, peripheral pulses normal and symmetric, no cyanosis, clubbing, edema or varicosities Chest: no scars, masses, tenderness; no asymmetry, skin changes, nipple discharge   Gastrointestinal: soft, non-tender; no hepatosplenomegaly, masses; active bowel sounds all quadrants, no masses, tenderness, hemorrhoids  Genitourinary: no vaginal discharge, lesions; no masses or tenderness, pap done Lymphatic: no cervical, axillary or inguinal adenopathy Musculoskeletal: gait normal, muscle tone and strength WNL, no joint swelling, effusions, discoloration, crepitus  Skin: clear, good turgor, color WNL, no rashes, lesions, or ulcerations Neurologic: normal mental status, normal reflexes, normal strength, sensation, and motion Psychiatric: alert; oriented to person, place and time Other Exam:

## 2010-10-04 NOTE — Assessment & Plan Note (Signed)
Summary: STUFFY, WEAK, FEVER/CJR   Vital Signs:  Patient profile:   40 year old female Temp:     98.5 degrees F oral BP sitting:   130 / 90  (left arm) Cuff size:   regular  Vitals Entered By: Sid Falcon LPN (Jan 11, 2009 1:57 PM) CC: Congestion, feeling lousy, weak X 3 days, ? allergies or sinus infection Is Patient Diabetic? No   History of Present Illness: Patient seen as a work in with three-day history of nasal congestion, fatigue, mild sore throat, mostly nonproductive cough, and mild body aches. She denies any nausea or vomiting. No diarrhea. Patient is nonsmoker. She has hyperlipidemia and hypertension and is treated for these. She has taken NyQuil which has helped her sleep.  Allergies: 1)  * No Known Drug Allergies Group [id number] reviewed for relevance  Review of Systems  The patient denies fever, decreased hearing, hoarseness, prolonged cough, and headaches.    Physical Exam  General:  Well-developed,well-nourished,in no acute distress; alert,appropriate and cooperative throughout examination Head:  Normocephalic and atraumatic without obvious abnormalities. No apparent alopecia or balding. Ears:  External ear exam shows no significant lesions or deformities.  Otoscopic examination reveals clear canals, tympanic membranes are intact bilaterally without bulging, retraction, inflammation or discharge. Hearing is grossly normal bilaterally. Nose:  External nasal examination shows no deformity or inflammation. Nasal mucosa are pink and moist without lesions or exudates. Mouth:  no erythema or exudate noted Neck:  supple no adenopathy Lungs:  clear to auscultation Heart:  regular rhythm and rate   Impression & Recommendations:  Problem # 1:  VIRAL URI (ICD-465.9) Patient will try over-the-counter medication such as Mucinex or Mucinex D. and push plenty of fluids. Tylenol or Advil for symptom relief. Followup p.r.n.  Complete Medication List: 1)  Sertraline Hcl  100 Mg Tabs (Sertraline hcl) .... Take 1 tablet by mouth once a day 2)  Atenolol 25 Mg Tabs (Atenolol) .Marland Kitchen.. 1 by mouth once daily 3)  Lipitor 80 Mg Tabs (Atorvastatin calcium) .... Take 1 tab by mouth daily or as directed 4)  Reclipsen 0.15-30 Mg-mcg Tabs (Desogestrel-ethinyl estradiol) .... Take 1 tablet by mouth once a day

## 2010-10-04 NOTE — Progress Notes (Signed)
Summary: BP questions  Phone Note Call from Patient   Caller: Patient Call For: Birdie Sons MD Summary of Call: [facility] BPs are running 140s/90's, and thinks she needs med adjustment. [phone] Taking Amlodipine 5 mg. daily Initial call taken by: Lynann Beaver CMA,  March 02, 2010 10:01 AM  Follow-up for Phone Call        change to lotrel 10/5 Follow-up by: Stacie Glaze MD,  March 02, 2010 2:10 PM    New/Updated Medications: AMLODIPINE BESY-BENAZEPRIL HCL 5-10 MG CAPS (AMLODIPINE BESY-BENAZEPRIL HCL) one by mouth daily Prescriptions: AMLODIPINE BESY-BENAZEPRIL HCL 5-10 MG CAPS (AMLODIPINE BESY-BENAZEPRIL HCL) one by mouth daily  #30 x 2   Entered by:   Stacie Glaze MD   Authorized by:   Birdie Sons MD   Signed by:   Stacie Glaze MD on 03/02/2010   Method used:   Electronically to        [facility]  [phone]* (retail)       [address]       [place], [place]  [address]       Ph: [phone] or [phone]       Fax: [phone]   RxID:   [id number]  Left message on pt's voice mail.

## 2010-10-04 NOTE — Assessment & Plan Note (Signed)
Summary: 1 month rov/njr   Vital Signs:  Patient profile:   40 year old female Weight:      215 pounds BMI:     35.09 Pulse rate:   74 / minute Pulse rhythm:   regular Resp:     12 per minute BP sitting:   132 / 88  (left arm) Cuff size:   regular  Vitals Entered By: Gladis Riffle, RN (February 13, 2010 11:50 AM) CC: 1 month rov--BP 126/78-140/89 at home Is Patient Diabetic? No Comments forgets night time dose labetalol at times   CC:  1 month rov--BP 126/78-140/89 at home.  History of Present Illness: forgets night time labetolol BPs are good if she remembers meds (110s/60s)  BPs as high as 140/90  no side effects on labetolol  All other systems reviewed and were negative   Preventive Screening-Counseling & Management  Alcohol-Tobacco     Smoking Status: never  Allergies (verified): No Known Drug Allergies  Past History:  Past Medical History: Last updated: 04/01/2007 GERD Hyperlipidemia Hypertension Depression  Past Surgical History: Last updated: 02/06/2007 open laparotomy  Family History: Last updated: 03/14/2008 Family History of CAD Female 1st degree relative--mother Family History Diabetes 1st degree relative father, sister, mother Family History High cholesterol Family History Hypertension Father: alive, HTN, DMII, hyperlipidemia Mother: alive--HTN, MI, CAD, hyperlipidemia, ?anemia, lupus Siblings:0 brothers, 1 sister--DMII, hyperlipidemia   Social History: Last updated: 04/24/2007 Occupation: Single Regular exercise-yes  Risk Factors: Exercise: yes (04/24/2007)  Risk Factors: Smoking Status: never (02/13/2010)  Physical Exam  General:  alert and well-developed.   Neck:  supple no adenopathy Chest Wall:  No deformities, masses, or tenderness noted. Lungs:  Normal respiratory effort, chest expands symmetrically. Lungs are clear to auscultation, no crackles or wheezes. Heart:  Normal rate and regular rhythm. S1 and S2 normal without  gallop, murmur, click, rub or other extra sounds.   Impression & Recommendations:  Problem # 1:  HYPERTENSION (ICD-401.9)  change meds side effects discussed Her updated medication list for this problem includes:    Amlodipine Besylate 5 Mg Tabs (Amlodipine besylate) .Marland Kitchen... 1 by mouth every day  BP today: 132/88 Prior BP: 136/82 (01/16/2010)  Prior 10 Yr Risk Heart Disease: Not enough information (09/01/2007)  Labs Reviewed: K+: 5.2 (04/17/2009) Creat: : 0.9 (04/17/2009)   Chol: 198 (04/17/2009)   HDL: 58.70 (04/17/2009)   LDL: 116 (04/17/2009)   TG: 119.0 (04/17/2009)  Complete Medication List: 1)  Sertraline Hcl 100 Mg Tabs (Sertraline hcl) .... Take 1 tablet by mouth once a day 2)  Amlodipine Besylate 5 Mg Tabs (Amlodipine besylate) .Marland Kitchen.. 1 by mouth every day 3)  Lipitor 80 Mg Tabs (Atorvastatin calcium) .... Take 1 tab by mouth daily or as directed 4)  Apri 0.15-30 Mg-mcg Tabs (Desogestrel-ethinyl estradiol) .... Take 1 tablet by mouth once a day --needs ov for physical and pap in july after 7/13 5)  Alprazolam 0.25 Mg Tabs (Alprazolam) .... Every other day tab as needed anxiety  Patient Instructions: 1)  Please schedule a follow-up appointment in 1 month. see me 4-6 weeks CPX Prescriptions: AMLODIPINE BESYLATE 5 MG  TABS (AMLODIPINE BESYLATE) 1 by mouth every day  #90 x 3   Entered and Authorized by:   Birdie Sons MD   Signed by:   Birdie Sons MD on 02/13/2010   Method used:   [facility]        [facility]  [address] [phone][facility] ([facility]ail)       [address]  [place], [place]  [address]       Ph: [phone] or [phone]       Fax: [phone]   RxID:   [id number]

## 2010-10-04 NOTE — Assessment & Plan Note (Signed)
Summary: fu on medication/njr   Vital Signs:  Patient Profile:   40 Years Old Female Weight:      205 pounds Temp:     98.2 degrees F oral Pulse rate:   50 / minute Pulse rhythm:   regular Resp:     12 per minute BP sitting:   120 / 88  Pt. in pain?   no  Vitals Entered By: Lynann Beaver CMA (April 01, 2007 10:43 AM)               Medications Added EFFEXOR 37.5 MG  TABS (VENLAFAXINE HCL) as directed than 2 by mouth once daily YAZ 3-0.02 MG  TABS (DROSPIRENONE-ETHINYL ESTRADIOL) one by mouth daily ATENOLOL 25 MG  TABS (ATENOLOL) one by mouth daily SIMVASTATIN 20 MG  TABS (SIMVASTATIN) one by mouth daily        Chief Complaint:  to discuss Zoloft dosage and/or changing it.  History of Present Illness: She has a history of depression---she and counselor discussed the possibility of changing or increasing meds    Past Medical History:    GERD    Hyperlipidemia    Hypertension    Depression        Impression & Recommendations:  Problem # 1:  DEPRESSION (ICD-311)  Her updated medication list for this problem includes:    Effexor 37.5 Mg Tabs (Venlafaxine hcl) .Marland Kitchen... As directed than 2 by mouth once daily  see me 6 weeks call for any side effects total time 15 min   Complete Medication List: 1)  Effexor 37.5 Mg Tabs (Venlafaxine hcl) .... As directed than 2 by mouth once daily 2)  Yaz 3-0.02 Mg Tabs (Drospirenone-ethinyl estradiol) .... One by mouth daily 3)  Atenolol 25 Mg Tabs (Atenolol) .... One by mouth daily 4)  Simvastatin 20 Mg Tabs (Simvastatin) .... One by mouth daily   Patient Instructions: 1)  see me 6 weeks 2)  venlafaxine (effexor xr) 37.5 mg by mouth ad for one week than 2 by mouth once daily    Prescriptions: EFFEXOR 37.5 MG  TABS (VENLAFAXINE HCL) as directed than 2 by mouth once daily  #60 x 11   Entered and Authorized by:   Birdie Sons MD   Signed by:   Birdie Sons MD on 04/01/2007   Method used:   Electronically sent to ...    [facility]       [phone]       [place], [place]  [address]       Ph: [phone]       Fax: [phone]   RxID:   [id number]

## 2010-10-04 NOTE — Progress Notes (Signed)
Summary: LMTCB/vaginal itching  Medications Added DIFLUCAN 150 MG TABS (FLUCONAZOLE) once daily       Phone Note Call from Patient Call back at [phone]   Caller: Patient Call For: Dee Paden Summary of Call: PT IS ON  ANTIBIOTICS HAS YEAST INFECTION NEED A CALL IN FOR  RX PLS CALL THX.  Initial call taken by: Shan Levans,  April 29, 2007 11:07 AM  Follow-up for Phone Call        [facility] with name of pharmacy and additional sx information. ..................................................................Marland KitchenSid Falcon LPN  April 29, 2007 12:03 PM   Additional Follow-up for Phone Call Additional follow up Details #1::        [facility] [address] [facility] [place]  sx are itiching, odor, discharge, "always happens when I'm on an antibiotic" may leave a msg on cell phone [phone] Additional Follow-up by: Sid Falcon LPN,  April 29, 2007 1:19 PM    Additional Follow-up for Phone Call Additional follow up Details #2::    rx called in Follow-up by: Birdie Sons MD,  April 29, 2007 4:24 PM  New/Updated Medications: DIFLUCAN 150 MG TABS (FLUCONAZOLE) once daily   Prescriptions: DIFLUCAN 150 MG TABS (FLUCONAZOLE) once daily  #1 x 0   Entered by:   Birdie Sons MD   Authorized by:   Alita Chyle Triage Nurse   Signed by:   Birdie Sons MD on 04/29/2007   Method used:   Electronically [facility] to ...       [facility] [phone] [address]       [address]       [place], [place]  [address]       Ph: [phone] or [phone]       Fax: [phone]   RxID:   [id number]     Appended Document: LMTCB/vaginal itching     Phone Note Call from Patient   Summary of Call: Dr Cato Mulligan ordered Diflucan Initial call taken by: Sid Falcon LPN,  April 29, 2007 4:43 PM  Follow-up for Phone Call        Diflucan 150 mg once daily, #1, 0 [facility] [place]  Pt informed. Follow-up by: Sid Falcon LPN,  April 29, 2007 4:48 PM

## 2010-10-04 NOTE — Progress Notes (Signed)
Summary: refill  Medications Added EFFEXOR 37.5 MG  TABS (VENLAFAXINE HCL) as directed than 2 by mouth once daily EFFEXOR XR 75 MG CP24 (VENLAFAXINE HCL) once daily YAZ 3-0.02 MG  TABS (DROSPIRENONE-ETHINYL ESTRADIOL) one by mouth daily ATENOLOL 25 MG  TABS (ATENOLOL) one by mouth daily ATENOLOL 25 MG  TABS (ATENOLOL) one by mouth daily SIMVASTATIN 40 MG  TABS (SIMVASTATIN) one bid SIMVASTATIN 20 MG  TABS (SIMVASTATIN) one by mouth daily SIMVASTATIN 40 MG  TABS (SIMVASTATIN) once daily CIPRO 500 MG TABS (CIPROFLOXACIN HCL) two times a day CIPRO 500 MG TABS (CIPROFLOXACIN HCL) two times a day METRONIDAZOLE 250 MG TABS (METRONIDAZOLE) three times a day METRONIDAZOLE 250 MG TABS (METRONIDAZOLE) three times a day DIFLUCAN 150 MG TABS (FLUCONAZOLE) once daily DIFLUCAN 150 MG TABS (FLUCONAZOLE) once daily       Phone Note Call from Patient Call back at [phone]   Caller: patinet Call For: Arlee Santosuosso Summary of Call: simvastatin  dr Reichen Hutzler told her to take 2 instead of 1  got refill last time for the amount for 1 per day  [facility] Dowse [facility] out  please call in refill and call her to let her know so she can pick it up  Initial call taken by: Roselle Locus,  July 01, 2007 1:02 PM    New/Updated Medications: SIMVASTATIN 40 MG  TABS (SIMVASTATIN) one bid   Prescriptions: SIMVASTATIN 40 MG  TABS (SIMVASTATIN) one bid  #180 x 3   Entered by:   Lynann Beaver CMA   Authorized by:   Birdie Sons MD   Signed by:   Lynann Beaver CMA on 07/01/2007   Method used:   [facility] ...       [facility]  [id number]       [address]       [place], [place]  [address]       Ph: [phone] or [phone]       Fax: [phone]   RxID:   [id number] SIMVASTATIN 40 MG  TABS (SIMVASTATIN) one bid  #180 x 3   Entered by:   Lynann Beaver CMA   Authorized by:   Birdie Sons MD   Signed by:   Lynann Beaver CMA on 07/01/2007   Method used:   Print then Give  to Patient   RxID:   [id number]

## 2010-10-04 NOTE — Letter (Signed)
Summary: Breast Imaging Follow-up Information/[facility]  Breast Imaging Follow-up Information/[facility]   Imported By: Maryln Gottron 04/12/2010 15:17:52  _____________________________________________________________________  External Attachment:    Type:   Image     Comment:   External Document

## 2010-10-04 NOTE — Assessment & Plan Note (Signed)
Summary: 3 MONTH ROA/JLS  Medications Added EFFEXOR XR 75 MG CP24 (VENLAFAXINE HCL) once daily SIMVASTATIN 40 MG  TABS (SIMVASTATIN) once daily        Vital Signs:  Patient Profile:   40 Years Old Female Weight:      205 pounds Temp:     98.6 degrees F oral Pulse (ortho):   56 / minute Pulse rhythm:   regular Resp:     16 per minute BP sitting:   104 / 74  Vitals Entered By: Lynann Beaver CMA (May 13, 2007 10:13 AM)                 Chief Complaint:  rov.  History of Present Illness:  Follow-Up Visit:hypertension, hyperlipidemia, depression      This is a 40 year old woman who presents for Follow-up visit.  The patient denies chest pain, palpitations, dizziness, syncope, low blood sugar symptoms, high blood sugar symptoms, edema, SOB, DOE, PND, and orthopnea.  Since the last visit the patient notes no new problems or concerns.  The patient reports taking meds as prescribed.  When questioned about possible medication side effects, the patient notes none.      Past Medical History:    Reviewed history from 04/01/2007 and no changes required:       GERD       Hyperlipidemia       Hypertension       Depression  Past Surgical History:    Reviewed history from 02/06/2007 and no changes required:       open laparotomy   Social History:    Reviewed history from 04/24/2007 and no changes required:       Occupation:       Single       Regular exercise-yes    Review of Systems       no other complaints   Physical Exam  General:     Well-developed,well-nourished,in no acute distress; alert,appropriate and cooperative throughout examination Head:     normocephalic.   Ears:     no external deformities.   Nose:     no external deformity.   Neck:     No deformities, masses, or tenderness noted. Chest Wall:     No deformities, masses, or tenderness noted. Lungs:     Normal respiratory effort, chest expands symmetrically. Lungs are clear to auscultation,  no crackles or wheezes. Heart:     Normal rate and regular rhythm. S1 and S2 normal without gallop, murmur, click, rub or other extra sounds.    Impression & Recommendations:  Problem # 1:  HYPERLIPIDEMIA (ICD-272.4)  Her updated medication list for this problem includes:    Simvastatin 40 Mg Tabs (Simvastatin) ..... Once daily  Labs Reviewed: Chol: 256 (05/06/2007)   HDL: 57.6 (05/06/2007)   LDL: DEL (05/06/2007)   TG: 306 (05/06/2007) SGOT: 24 (05/06/2007)   SGPT: 28 (05/06/2007)   Problem # 2:  HYPERTENSION (ICD-401.9) stop atenolol---Bp has been great The following medications were removed from the medication list:    Atenolol 25 Mg Tabs (Atenolol) ..... One by mouth daily  BP today: 104/74 Prior BP: 102/60 (04/24/2007)  Labs Reviewed: Creat: 0.9 (01/21/2007) Chol: 256 (05/06/2007)   HDL: 57.6 (05/06/2007)   LDL: DEL (05/06/2007)   TG: 306 (05/06/2007)   Problem # 3:  ABDOMINAL PAIN, ACUTE (ICD-789.00) resolved  Problem # 4:  DEPRESSION (ICD-311) doing well on effexor Her updated medication list for this problem includes:    Effexor  Xr 75 Mg Cp24 (Venlafaxine hcl) ..... Once daily   Complete Medication List: 1)  Effexor Xr 75 Mg Cp24 (Venlafaxine hcl) .... Once daily 2)  Yaz 3-0.02 Mg Tabs (Drospirenone-ethinyl estradiol) .... One by mouth daily 3)  Simvastatin 40 Mg Tabs (Simvastatin) .... Once daily   Patient Instructions: 1)  Please schedule a follow-up appointment in 3 months. 2)  Hepatic Panel prior to visit, ICD-9: 3)  Lipid Panel prior to visit, ICD-9:    Prescriptions: EFFEXOR XR 75 MG CP24 (VENLAFAXINE HCL) once daily  #90 x 3   Entered and Authorized by:   Birdie Sons MD   Signed by:   Birdie Sons MD on 05/14/2007   Method used:   Historical   RxID:   [id number] SIMVASTATIN 40 MG  TABS (SIMVASTATIN) once daily  #100 x 3   Entered and Authorized by:   Birdie Sons MD   Signed by:   Birdie Sons MD on 05/13/2007   Method used:    [facility] ...       [id number] [address]       [address]       [place], [place]  [address]       Ph: [phone] or [phone]       Fax: [phone]   RxID:   [id number]  ]

## 2010-10-04 NOTE — Assessment & Plan Note (Signed)
Summary: HTN CONCERNS // RS   Vital Signs:  Patient profile:   40 year old female Weight:      218 pounds Temp:     98.5 degrees F oral Pulse rate:   62 / minute Pulse rhythm:   regular Resp:     12 per minute BP sitting:   136 / 82  (left arm) Cuff size:   regular  Vitals Entered By: Gladis Riffle, RN (Jan 16, 2010 11:22 AM) CC: HTN clinic at work 140/100 to 132/86; Nurse practitioner increased atenolol to 25mg  bid on 01/08/10--now feels tired so wants to discuss Is Patient Diabetic? No Comments panic attacks, nurse pract xanax 0.25mg   three times a day as needed  on 01/08/10   CC:  HTN clinic at work 140/100 to 132/86; Nurse practitioner increased atenolol to 25mg  bid on 01/08/10--now feels tired so wants to discuss.  History of Present Illness: pt occasionally takes BP last week took bp because of headache: 140/100 NP at work increased atenolol to two times a day  She admits to occasional panic attacks---NP gave alprazolam---some relief cu  Preventive Screening-Counseling & Management  Alcohol-Tobacco     Smoking Status: never reviewed meds---she has only taken a few alprazolam---has two prescriptions currently  Current Problems (verified): 1)  Preventive Health Care  (ICD-V70.0) 2)  Family History Diabetes 1st Degree Relative  (ICD-V18.0) 3)  Family History of Cad Female 1st Degree Relative <50  (ICD-V17.3) 4)  Depression  (ICD-311) 5)  Hypertension  (ICD-401.9) 6)  Hyperlipidemia  (ICD-272.4)  Current Medications (verified): 1)  Sertraline Hcl 100 Mg Tabs (Sertraline Hcl) .... Take 1 Tablet By Mouth Once A Day 2)  Atenolol 25 Mg  Tabs (Atenolol) .Marland Kitchen.. 1 By Mouth Twice Daily 3)  Lipitor 80 Mg Tabs (Atorvastatin Calcium) .... Take 1 Tab By Mouth Daily or As Directed 4)  Apri 0.15-30 Mg-Mcg Tabs (Desogestrel-Ethinyl Estradiol) .... Take 1 Tablet By Mouth Once A Day --Needs Ov For Physical and Pap in July After 7/13 5)  Alprazolam 0.25 Mg Tabs (Alprazolam) .... Take 1 Tablet By  Mouth Three Times A Day As Needed 6)  Alprazolam 0.25 Mg Tabs (Alprazolam) .... Take 1 Tablet By Mouth Two Times A Day  Allergies: 1)  * No Known Drug Allergies Group  Past History:  Past Medical History: Last updated: 04/01/2007 GERD Hyperlipidemia Hypertension Depression  Past Surgical History: Last updated: 02/06/2007 open laparotomy  Family History: Last updated: 03/14/2008 Family History of CAD Female 1st degree relative--mother Family History Diabetes 1st degree relative father, sister, mother Family History High cholesterol Family History Hypertension Father: alive, HTN, DMII, hyperlipidemia Mother: alive--HTN, MI, CAD, hyperlipidemia, ?anemia, lupus Siblings:0 brothers, 1 sister--DMII, hyperlipidemia   Social History: Last updated: 04/24/2007 Occupation: Single Regular exercise-yes  Risk Factors: Exercise: yes (04/24/2007)  Risk Factors: Smoking Status: never (01/16/2010)  Physical Exam  General:  alert and well-developed.   Head:  normocephalic and atraumatic.   Eyes:  pupils equal and pupils round.   Ears:  R ear normal and L ear normal.   Neck:  supple no adenopathy Chest Wall:  No deformities, masses, or tenderness noted. Lungs:  Normal respiratory effort, chest expands symmetrically. Lungs are clear to auscultation, no crackles or wheezes. Abdomen:  soft and non-tender.  overweight Msk:  No deformity or scoliosis noted of thoracic or lumbar spine.   Pulses:  R radial normal and L radial normal.   Neurologic:  alert & oriented X3 and gait normal.   Skin:  turgor normal and color normal.     Impression & Recommendations:  Problem # 1:  HYPERTENSION (ICD-401.9) fair control continue current medications  Her updated medication list for this problem includes:    Labetalol Hcl 200 Mg Tabs (Labetalol hcl) .Marland Kitchen... 1 tab twice daily  BP today: 136/82 Prior BP: 128/88 (04/21/2009)  Prior 10 Yr Risk Heart Disease: Not enough information  (09/01/2007)  Labs Reviewed: K+: 5.2 (04/17/2009) Creat: : 0.9 (04/17/2009)   Chol: 198 (04/17/2009)   HDL: 58.70 (04/17/2009)   LDL: 116 (04/17/2009)   TG: 119.0 (04/17/2009)  Problem # 2:  HYPERLIPIDEMIA (ICD-272.4) controlled continue current medications  Her updated medication list for this problem includes:    Lipitor 80 Mg Tabs (Atorvastatin calcium) .Marland Kitchen... Take 1 tab by mouth daily or as directed  Labs Reviewed: SGOT: 34 (04/17/2009)   SGPT: 25 (04/17/2009)  Prior 10 Yr Risk Heart Disease: Not enough information (09/01/2007)   HDL:58.70 (04/17/2009), 55.6 (09/20/2008)  LDL:116 (04/17/2009), DEL (09/20/2008)  Chol:198 (04/17/2009), 207 (09/20/2008)  Trig:119.0 (04/17/2009), 122 (09/20/2008)  Complete Medication List: 1)  Sertraline Hcl 100 Mg Tabs (Sertraline hcl) .... Take 1 tablet by mouth once a day 2)  Labetalol Hcl 200 Mg Tabs (Labetalol hcl) .Marland Kitchen.. 1 tab twice daily 3)  Lipitor 80 Mg Tabs (Atorvastatin calcium) .... Take 1 tab by mouth daily or as directed 4)  Apri 0.15-30 Mg-mcg Tabs (Desogestrel-ethinyl estradiol) .... Take 1 tablet by mouth once a day --needs ov for physical and pap in july after 7/13 5)  Alprazolam 0.25 Mg Tabs (Alprazolam) .... Every other day tab as needed anxiety  Patient Instructions: 1)  Please schedule a follow-up appointment in 1 month. Prescriptions: LABETALOL HCL 200 MG TABS (LABETALOL HCL) 1 tab twice daily  #180 x 3   Entered and Authorized by:   Birdie Sons MD   Signed by:   Birdie Sons MD on 01/16/2010   Method used:   [facility]  [address] [phone]       [address]       [place], [place]  [address]       Ph: [phone] or [phone]       Fax: [phone]   RxID:   [id number]

## 2010-10-04 NOTE — Assessment & Plan Note (Signed)
Summary: 6 month rov/njr/RESCH WITH PATIENT/JLS   Vital Signs:  Patient Profile:   40 Years Old Female Height:     65.75 inches Weight:      2087 pounds Temp:     98.3 degrees F Pulse rate:   64 / minute BP sitting:   124 / 88  (left arm)  Vitals Entered By: Gladis Riffle, RN (September 28, 2008 8:34 AM)                 Chief Complaint:  6 month rov and labs done.  History of Present Illness: URI sxs for 2-3 wqeeks---still has nonproductive cough  Mood---doing well on meds, some family stressors  htn---tolerating meds  lipids---doing well on lipitor  Past Medical History: GERD Hyperlipidemia Hypertension Depression  Past Surgical History: open laparotomy Social History: Occupation: Single Regular exercise-yes  Family History: Family History of CAD Female 1st degree relative--mother Family History Diabetes 1st degree relative father, sister, mother Family History High cholesterol Family History Hypertension Father: alive, HTN, DMII, hyperlipidemia Mother: alive--HTN, MI, CAD, hyperlipidemia, ?anemia, lupus Siblings:0 brothers, 1 sister--DMII, hyperlipidemia   no other complaints in a complete ROS     Updated Prior Medication List: SERTRALINE HCL 100 MG TABS (SERTRALINE HCL) Take 1 tablet by mouth once a day ATENOLOL 25 MG  TABS (ATENOLOL) 1 by mouth once daily LIPITOR 80 MG TABS (ATORVASTATIN CALCIUM) Take 1 tab by mouth daily or as directed RECLIPSEN 0.15-30 MG-MCG  TABS (DESOGESTREL-ETHINYL ESTRADIOL) Take 1 tablet by mouth once a day  Current Allergies (reviewed today): * NO KNOWN DRUG ALLERGIES GROUP      Physical Exam  General:     Well-developed,well-nourished,in no acute distress; alert,appropriate and cooperative throughout examination Head:     normocephalic and atraumatic.   Eyes:     pupils equal and pupils round.   Ears:     R ear normal and L ear normal.   Nose:     nasal mucosla erythema Neck:     No deformities, masses, or  tenderness noted. Chest Wall:     No deformities, masses, or tenderness noted. Lungs:     Normal respiratory effort, chest expands symmetrically. Lungs are clear to auscultation, no crackles or wheezes. Abdomen:     Bowel sounds positive,abdomen soft and non-tender without masses, organomegaly or hernias noted. Msk:     No deformity or scoliosis noted of thoracic or lumbar spine.   Pulses:     R radial normal and L radial normal.   Neurologic:     cranial nerves II-XII intact and gait normal.      Impression & Recommendations:  Problem # 1:  HYPERLIPIDEMIA (ICD-272.4) controlled pregnancy risks discussed Her updated medication list for this problem includes:    Lipitor 80 Mg Tabs (Atorvastatin calcium) .Marland Kitchen... Take 1 tab by mouth daily or as directed  Labs Reviewed: Chol: 207 (09/20/2008)   HDL: 55.6 (09/20/2008)   LDL: 128.6 (09/20/2008)   TG: 122 (09/20/2008) SGOT: 29 (09/20/2008)   SGPT: 29 (09/20/2008)  Prior 10 Yr Risk Heart Disease: Not enough information (09/01/2007)   Problem # 2:  HYPERTENSION (ICD-401.9) adequate control Her updated medication list for this problem includes:    Atenolol 25 Mg Tabs (Atenolol) .Marland Kitchen... 1 by mouth once daily  BP today: 124/88 Prior BP: 136/92 (06/09/2008)  Prior 10 Yr Risk Heart Disease: Not enough information (09/01/2007)  Labs Reviewed: Creat: 0.9 (03/07/2008) Chol: 207 (09/20/2008)   HDL: 55.6 (09/20/2008)   LDL: 128.6 (09/20/2008)  TG: 122 (09/20/2008)   Problem # 3:  DEPRESSION (ICD-311) doing well on meds Her updated medication list for this problem includes:    Sertraline Hcl 100 Mg Tabs (Sertraline hcl) .Marland Kitchen... Take 1 tablet by mouth once a day  uri---allfen dm two times a day side effects discussed samples given  Complete Medication List: 1)  Sertraline Hcl 100 Mg Tabs (Sertraline hcl) .... Take 1 tablet by mouth once a day 2)  Atenolol 25 Mg Tabs (Atenolol) .Marland Kitchen.. 1 by mouth once daily 3)  Lipitor 80 Mg Tabs  (Atorvastatin calcium) .... Take 1 tab by mouth daily or as directed 4)  Reclipsen 0.15-30 Mg-mcg Tabs (Desogestrel-ethinyl estradiol) .... Take 1 tablet by mouth once a day   Patient Instructions: 1)  Please schedule a follow-up appointment in 6 months. 2)  cpx

## 2010-10-04 NOTE — Progress Notes (Signed)
Summary: Atenolol question with BP concerns  Phone Note Call from Patient   Caller: Patient Call For: Alano Blasco Summary of Call: BP have been running 136/88, 134/98, feeling fine.  Pt was on Atenelol 25 mg one daily up until 12/08 when it was dropped to 1/2 daily.  Because her BP have been fluxuating, wondering if she should go back to one daily.  Pt does need a new Rx [facility] with new sig as her Rx has expired Initial call taken by: Sid Falcon LPN,  May 03, 2008 11:06 AM  Follow-up for Phone Call        go back to one daily Follow-up by: Birdie Sons MD,  May 03, 2008 5:16 PM  Additional Follow-up for Phone Call Additional follow up Details #1::        LMTCB Additional Follow-up by: Lynann Beaver CMA,  May 04, 2008 10:39 AM      Prescriptions: ATENOLOL 25 MG  TABS (ATENOLOL) 1 by mouth once daily  #30 x 6   Entered by:   Lynann Beaver CMA   Authorized by:   Birdie Sons MD   Signed by:   Lynann Beaver CMA on 05/04/2008   Method used:   [facility]        [facility]  [address] [phone]       [address] [place], [place]  [address]       Ph: [phone] or [phone]       Fax: [phone]   RxID:   [id number]   Appended Document: Atenolol question with BP concerns LMTCB  Appended Document: Atenolol question with BP concerns Called back.  Patient aware.

## 2010-10-04 NOTE — Assessment & Plan Note (Signed)
Summary: intestinal problems/njr 11.30a  Medications Added CIPRO 500 MG TABS (CIPROFLOXACIN HCL) two times a day METRONIDAZOLE 250 MG TABS (METRONIDAZOLE) three times a day        Vital Signs:  Patient Profile:   40 Years Old Female Weight:      206 pounds Temp:     98.4 degrees F oral Pulse rate:   82 / minute Pulse rhythm:   regular Resp:     12 per minute BP sitting:   102 / 60  Vitals Entered By: Lynann Beaver CMA (April 24, 2007 11:40 AM)               Chief Complaint:  lower abdominal pain .  History of Present Illness: She complains of bilateral lower quadrant discomfort for 6 days. in addition she has difficulty starting to urinate. no dysuria but she has a lot of pressure. In addition her last menstrual cycle was prolonged and painful ---2+ weeks (ended approx 5-6 days ago.    Past Medical History:    Reviewed history from 04/01/2007 and no changes required:       GERD       Hyperlipidemia       Hypertension       Depression  Past Surgical History:    Reviewed history from 02/06/2007 and no changes required:       open laparotomy   Social History:    Occupation:    Single    Regular exercise-yes   Risk Factors:  Exercise:  yes    Physical Exam  General:     Well-developed,well-nourished,in no acute distress; alert,appropriate and cooperative throughout examination Head:     Normocephalic and atraumatic without obvious abnormalities. No apparent alopecia or balding. Eyes:     vision grossly intact, pupils equal, and pupils round.   Ears:     R ear normal, L ear normal, and no external deformities.   Nose:     no external deformity and no external erythema.   Mouth:     good dentition.   Neck:     No deformities, masses, or tenderness noted. Chest Wall:     No deformities, masses, or tenderness noted. Lungs:     Normal respiratory effort, chest expands symmetrically. Lungs are clear to auscultation, no crackles or wheezes. Heart:    Normal rate and regular rhythm. S1 and S2 normal without gallop, murmur, click, rub or other extra sounds. Abdomen:     active bowel sounds, sof,nondistended, no masses. she is tender in bilateral lower quadrants.  No rebound or guarding tenderness. Msk:     No deformity or scoliosis noted of thoracic or lumbar spine.   Extremities:     No clubbing, cyanosis, edema, or deformity noted     Impression & Recommendations:  Problem # 1:  ABDOMINAL PAIN, ACUTE (ICD-789.00) unclear etiology. urinalysis is unremarkable.  I think she needs treatment for diverticulitis.  Will treat with Flagyl and Cipro.  All side effects discussed.  Method of taking medications as discussed.  She will call me for any side effects occur to call me if her symptoms do not resolve.  She will call me if her symptoms increase.  Complete Medication List: 1)  Effexor 37.5 Mg Tabs (Venlafaxine hcl) .... As directed than 2 by mouth once daily 2)  Yaz 3-0.02 Mg Tabs (Drospirenone-ethinyl estradiol) .... One by mouth daily 3)  Atenolol 25 Mg Tabs (Atenolol) .... One by mouth daily 4)  Simvastatin 20 Mg Tabs (  Simvastatin) .... One by mouth daily 5)  Cipro 500 Mg Tabs (Ciprofloxacin hcl) .... Two times a day 6)  Metronidazole 250 Mg Tabs (Metronidazole) .... Three times a day  Other Orders: UA Dipstick w/o Micro (81002)     Prescriptions: METRONIDAZOLE 250 MG TABS (METRONIDAZOLE) three times a day  #21 x 0   Entered and Authorized by:   Birdie Sons MD   Signed by:   Birdie Sons MD on 04/24/2007   Method used:   Electron[facility] to ...       [id number] [address]       [address]       [place], [place]  [address]       Ph: [phone] or [phone]       Fax: [phone]   RxID:   [id number] CIPRO 500 MG TABS (CIPROFLOXACIN HCL) two times a day  #14 x 0   Entered and Authorized by:   Birdie Sons MD   Signed by:   Birdie Sons MD on 04/24/2007   Method used:   Electronically sent  to ...       [id number] [address]       [address]       [place], [place]  [address]       Ph: [phone] or [phone]       Fax: [phone]   RxID:   [id number]   Laboratory Results   Urine Tests   Date/Time Reported: April 24, 2007 12:03 PM   Routine Urinalysis   Color: yellow Appearance: Clear Glucose: negative   (Normal Range: Negative) Bilirubin: negative   (Normal Range: Negative) Ketone: negative   (Normal Range: Negative) Spec. Gravity: 1.010   (Normal Range: 1.003-1.035) Blood: negative   (Normal Range: Negative) pH: 7.5   (Normal Range: 5.0-8.0) Protein: negative   (Normal Range: Negative) Urobilinogen: 0.2   (Normal Range: 0-1) Nitrite: negative   (Normal Range: Negative) Leukocyte Esterace: negative   (Normal Range: Negative)    Comments: ..................................................................Marland KitchenWynona Canes, CMA  April 24, 2007 12:03 PM

## 2010-10-04 NOTE — Assessment & Plan Note (Signed)
Summary: st/cough/chest congestion/njr   Vital Signs:  Patient Profile:   40 Years Old Female Weight:      213 pounds Temp:     98 degrees F BP sitting:   112 / 80  (left arm)  Vitals Entered By: Raechel Ache, RN (September 01, 2007 1:24 PM)                 Chief Complaint:  C/o sore throat, head congestion, cough, and achy x 3 days.Marland Kitchen  History of Present Illness: Started with ST and myalgias,  congestion and coughing ( hacking) Now with low grade fevers and chills   Hypertension History:      She denies headache, chest pain, palpitations, dyspnea with exertion, orthopnea, PND, peripheral edema, visual symptoms, neurologic problems, syncope, and side effects from treatment.  Further comments include: atenolol.        Positive major cardiovascular risk factors include hyperlipidemia and hypertension.  Negative major cardiovascular risk factors include female age less than 81 years old and non-tobacco-user status.     Current Allergies (reviewed today): * NO KNOWN DRUG ALLERGIES GROUP Updated/Current Medications (including changes made in today's visit):  YAZ 3-0.02 MG  TABS (DROSPIRENONE-ETHINYL ESTRADIOL) one by mouth daily SERTRALINE HCL 100 MG TABS (SERTRALINE HCL) Take 1 tablet by mouth once a day ATENOLOL 25 MG  TABS (ATENOLOL) 1/2 by mouth once daily LIPITOR 40 MG  TABS (ATORVASTATIN CALCIUM) 1 once daily ZITHROMAX Z-PAK 250 MG  TABS (AZITHROMYCIN) as directed ATUSS DS 30-4-30 MG/5ML  SUSP (PSEUDOEPHED HCL-CPM-DM HBR TAN) two tsp by mouth every 12 hours   Past Medical History:    Reviewed history from 04/01/2007 and no changes required:       GERD       Hyperlipidemia       Hypertension       Depression  Past Surgical History:    Reviewed history from 02/06/2007 and no changes required:       open laparotomy   Family History:    Reviewed history and no changes required:  Social History:    Reviewed history from 04/24/2007 and no changes required:  Occupation:       Single       Regular exercise-yes     Physical Exam  General:     Well-developed,well-nourished,in no acute distress; alert,appropriate and cooperative throughout examination Head:     normocephalic.   Eyes:     pupils equal and pupils round.   Ears:     R ear normal and L ear normal.   Nose:     nasal dischargemucosal pallor.   Mouth:     pharyngeal erythema and posterior lymphoid hypertrophy.   Neck:     No deformities, masses, or tenderness noted. Lungs:     Normal respiratory effort, chest expands symmetrically. Lungs are clear to auscultation, no crackles or wheezes.    Impression & Recommendations:  Problem # 1:  ACUTE PHARYNGITIS (ICD-462)  Her updated medication list for this problem includes:    Zithromax Z-pak 250 Mg Tabs (Azithromycin) .Marland Kitchen... As directed Instructed to complete antibiotics and call if not improved in 48 hours.   Complete Medication List: 1)  Yaz 3-0.02 Mg Tabs (Drospirenone-ethinyl estradiol) .... One by mouth daily 2)  Sertraline Hcl 100 Mg Tabs (Sertraline hcl) .... Take 1 tablet by mouth once a day 3)  Atenolol 25 Mg Tabs (Atenolol) .... 1/2 by mouth once daily 4)  Lipitor 40 Mg Tabs (Atorvastatin calcium) .Marland KitchenMarland KitchenMarland Kitchen 1  once daily 5)  Zithromax Z-pak 250 Mg Tabs (Azithromycin) .... As directed 6)  Atuss Ds 30-4-30 Mg/41ml Susp (Pseudoephed hcl-cpm-dm hbr tan) .... Two tsp by mouth every 12 hours  Hypertension Assessment/Plan:      The patient's hypertensive risk group is category B: At least one risk factor (excluding diabetes) with no target organ damage.  Today's blood pressure is 112/80.  Her blood pressure goal is < 140/90.     Prescriptions: ATUSS DS 30-4-30 MG/5ML  SUSP (PSEUDOEPHED HCL-CPM-DM HBR TAN) two tsp by mouth every 12 hours  #6oz x 0   Entered and Authorized by:   Stacie Glaze MD   Signed by:   Stacie Glaze MD on 09/01/2007   Method used:   Elect[facility] to ...       [id number]       [address]       [place], [place]  [address]       Ph: [phone] or [phone]       Fax: [phone]   RxID:   [id number] ZITHROMAX Z-PAK 250 MG  TABS (AZITHROMYCIN) as directed  #1 x 0   Entered and Authorized by:   Stacie Glaze MD   Signed by:   Stacie Glaze MD on 09/01/2007   Method used:   Electronically sent to ...       [id number]       [address]       [place], [place]  [address]       Ph: [phone] or [phone]       Fax: [phone]   RxID:   [id number]  ]  Appended Document: Orders Update    Clinical Lists Changes  Orders: Added new Service order of Est. Patient Level III ([address]) - Signed

## 2010-10-04 NOTE — Assessment & Plan Note (Signed)
Summary: 2 MONTH ROA/JLS reschedule/mhf rsc per pt/njr   Vital Signs:  Patient Profile:   40 Years Old Female Pulse rate:   78 / minute Resp:     16 per minute BP sitting:   138 / 84                 Chief Complaint:  f/u lipids.  History of Present Illness:  MENORRHAGIA (ICD-626.2)---periods are now normal off of BCPs DEPRESSION (ICD-311)-doing well on meds HYPERTENSION (ICD-401.9)---no sxs on meds HYPERLIPIDEMIA (ICD-272.4)--tolerating lipitor----previously on zocor  Past Medical History: GERD Hyperlipidemia Hypertension Depression  Social History: Occupation: Single Regular exercise-yes  Current Meds:  SERTRALINE HCL 100 MG TABS (SERTRALINE HCL) Take 1 tablet by mouth once a day ATENOLOL 25 MG  TABS (ATENOLOL) 1/2 by mouth once daily LIPITOR 80 MG TABS (ATORVASTATIN CALCIUM) Take 1 tab by mouth daily or as directed (1/2-daily)       Current Allergies: * NO KNOWN DRUG ALLERGIES GROUP     Review of Systems       no other complaints in a complete ROS      Impression & Recommendations:  Problem # 1:  HYPERTENSION (ICD-401.9) Bp today more elevated---continue meds.  Her updated medication list for this problem includes:    Atenolol 25 Mg Tabs (Atenolol) .Marland Kitchen... 1/2 by mouth once daily  Prior BP: 112/80 (09/01/2007)  Prior 10 Yr Risk Heart Disease: Not enough information (09/01/2007)  Labs Reviewed: Creat: 0.9 (01/21/2007) Chol: 175 (10/19/2007)   HDL: 50.4 (10/19/2007)   LDL: 102 (10/19/2007)   TG: 111 (10/19/2007)  BP today: 138/84 Prior BP: 112/80 (09/01/2007)  Prior 10 Yr Risk Heart Disease: Not enough information (09/01/2007)  Labs Reviewed: Creat: 0.9 (01/21/2007) Chol: 175 (10/19/2007)   HDL: 50.4 (10/19/2007)   LDL: 102 (10/19/2007)   TG: 111 (10/19/2007)   Problem # 2:  HYPERLIPIDEMIA (ICD-272.4)  Her updated medication list for this problem includes:    Lipitor 80 Mg Tabs (Atorvastatin calcium) .Marland Kitchen... Take 1 tab by mouth daily or  as directed  Labs Reviewed: Chol: 175 (10/19/2007)   HDL: 50.4 (10/19/2007)   LDL: 102 (10/19/2007)   TG: 111 (10/19/2007) SGOT: 28 (10/19/2007)   SGPT: 35 (10/19/2007)  Prior 10 Yr Risk Heart Disease: Not enough information (09/01/2007)   Complete Medication List: 1)  Sertraline Hcl 100 Mg Tabs (Sertraline hcl) .... Take 1 tablet by mouth once a day 2)  Atenolol 25 Mg Tabs (Atenolol) .... 1/2 by mouth once daily 3)  Lipitor 80 Mg Tabs (Atorvastatin calcium) .... Take 1 tab by mouth daily or as directed   Patient Instructions: 1)  Please schedule a follow-up appointment in 4 months. CPX    Prescriptions: LIPITOR 80 MG TABS (ATORVASTATIN CALCIUM) Take 1 tab by mouth daily or as directed  #90 x 3   Entered and Authorized by:   Birdie Sons MD   Signed by:   Birdie Sons MD on 11/06/2007   Method used:   Electronically sent to ...       [id number]       [address]       [place], [place]  [address]       Ph: [phone] or [phone]       Fax: [phone]   RxID:   [id number]  ]

## 2010-10-04 NOTE — Assessment & Plan Note (Signed)
Summary: cpx/no pap/njr rsc with pt from bump/mhf   Vital Signs:  Patient profile:   40 year old female Height:      65.75 inches Weight:      216 pounds BMI:     35.26 Temp:     98.4 degrees F oral Pulse rate:   70 / minute Pulse rhythm:   regular BP sitting:   128 / 88  (left arm) Cuff size:   regular  Vitals Entered By: Army Fossa CMA (April 21, 2009 9:02 AM) CC: cpx, no pap   CC:  cpx and no pap.  History of Present Illness: cpx  Current Problems (verified): 1)  Preventive Health Care  (ICD-V70.0) 2)  Family History Diabetes 1st Degree Relative  (ICD-V18.0) 3)  Family History of Cad Female 1st Degree Relative <50  (ICD-V17.3) 4)  Depression  (ICD-311) 5)  Hypertension  (ICD-401.9) 6)  Hyperlipidemia  (ICD-272.4)  Current Medications (verified): 1)  Sertraline Hcl 100 Mg Tabs (Sertraline Hcl) .... Take 1 Tablet By Mouth Once A Day 2)  Atenolol 25 Mg  Tabs (Atenolol) .Marland Kitchen.. 1 By Mouth Once Daily 3)  Lipitor 80 Mg Tabs (Atorvastatin Calcium) .... Take 1 Tab By Mouth Daily or As Directed 4)  Apri 0.15-30 Mg-Mcg Tabs (Desogestrel-Ethinyl Estradiol) .... Take 1 Tablet By Mouth Once A Day --Needs Ov For Physical and Pap in July After 7/13  Allergies (verified): 1)  * No Known Drug Allergies Group  Past History:  Past Medical History: Last updated: 04/01/2007 GERD Hyperlipidemia Hypertension Depression  Past Surgical History: Last updated: 02/06/2007 open laparotomy  Family History: Last updated: 03/14/2008 Family History of CAD Female 1st degree relative--mother Family History Diabetes 1st degree relative father, sister, mother Family History High cholesterol Family History Hypertension Father: alive, HTN, DMII, hyperlipidemia Mother: alive--HTN, MI, CAD, hyperlipidemia, ?anemia, lupus Siblings:0 brothers, 1 sister--DMII, hyperlipidemia   Social History: Last updated: 04/24/2007 Occupation: Single Regular exercise-yes  Risk Factors: Exercise: yes  (04/24/2007)  Risk Factors: Smoking Status: never (02/06/2007)  Review of Systems       All other systems reviewed and were negative   Physical Exam  General:  Well-developed,well-nourished,in no acute distress; alert,appropriate and cooperative throughout examination Head:  atraumatic and no abnormalities observed.   Eyes:  pupils equal and pupils round.   Ears:  R ear normal and L ear normal.   Neck:  supple no adenopathy Chest Wall:  No deformities, masses, or tenderness noted. Lungs:  Normal respiratory effort, chest expands symmetrically. Lungs are clear to auscultation, no crackles or wheezes. Heart:  Normal rate and regular rhythm. S1 and S2 normal without gallop, murmur, click, rub or other extra sounds. Abdomen:  Bowel sounds positive,abdomen soft and non-tender without masses, organomegaly or hernias noted.  overweight Genitalia:  done at [facility] Msk:  No deformity or scoliosis noted of thoracic or lumbar spine.   Pulses:  R radial normal and L radial normal.   Neurologic:  cranial nerves II-XII intact and gait normal.   Skin:  Intact without suspicious lesions or rashes Cervical Nodes:  No lymphadenopathy noted Psych:  Cognition and judgment appear intact. Alert and cooperative with normal attention span and concentration. No apparent delusions, illusions, hallucinations   Impression & Recommendations:  Problem # 1:  PREVENTIVE HEALTH CARE (ICD-V70.0) health maint UTD  She understands need for weight loss diet, exercise discussed---she understands  Problem # 2:  HYPERTENSION (ICD-401.9)  Her updated medication list for this problem includes:    Atenolol 25 Mg  Tabs (Atenolol) .Marland Kitchen... 1 by mouth once daily  BP today: 128/88 Prior BP: 130/90 (01/11/2009)  Prior 10 Yr Risk Heart Disease: Not enough information (09/01/2007)  Labs Reviewed: K+: 5.2 (04/17/2009) Creat: : 0.9 (04/17/2009)   Chol: 198 (04/17/2009)   HDL: 58.70 (04/17/2009)   LDL: 116 (04/17/2009)   TG:  119.0 (04/17/2009)  Problem # 3:  HYPERLIPIDEMIA (ICD-272.4) she will continue meds pregnancy in context of meds discussed she has no plans for preganncy in the next year Her updated medication list for this problem includes:    Lipitor 80 Mg Tabs (Atorvastatin calcium) .Marland Kitchen... Take 1 tab by mouth daily or as directed  Labs Reviewed: SGOT: 34 (04/17/2009)   SGPT: 25 (04/17/2009)  Prior 10 Yr Risk Heart Disease: Not enough information (09/01/2007)   HDL:58.70 (04/17/2009), 55.6 (09/20/2008)  LDL:116 (04/17/2009), DEL (09/20/2008)  Chol:198 (04/17/2009), 207 (09/20/2008)  Trig:119.0 (04/17/2009), 122 (09/20/2008)  Complete Medication List: 1)  Sertraline Hcl 100 Mg Tabs (Sertraline hcl) .... Take 1 tablet by mouth once a day 2)  Atenolol 25 Mg Tabs (Atenolol) .Marland Kitchen.. 1 by mouth once daily 3)  Lipitor 80 Mg Tabs (Atorvastatin calcium) .... Take 1 tab by mouth daily or as directed 4)  Apri 0.15-30 Mg-mcg Tabs (Desogestrel-ethinyl estradiol) .... Take 1 tablet by mouth once a day --needs ov for physical and pap in july after 7/13 Prescriptions: LIPITOR 80 MG TABS (ATORVASTATIN CALCIUM) Take 1 tab by mouth daily or as directed  #90 x 3   Entered and Authorized by:   Birdie Sons MD   Signed by:   Birdie Sons MD on 04/21/2009   Method used:   [facility] to        [phone][facility] ([facility]ail[facility]       [address] [place], [place]  [address]       Ph: [phone] or [phone]       Fax: [phone]   RxID:   [id number] ATENOLOL 25 MG  TABS (ATENOLOL) 1 by mouth once daily  #90 x 3   Entered and Authorized by:   Birdie Sons MD   Signed by:   Birdie Sons MD on 04/21/2009   Method used:   [facility] to        [facility]  [address] [phone][facility] ([facility]ail)       [address] [place], [place]  [address]       Ph: [phone] or [phone]       Fax: [phone]   RxID:   [id number] SERTRALINE HCL 100 MG TABS (SERTRALINE HCL) Take 1 tablet by mouth  once a day  #90 x 3   Entered and Authorized by:   Birdie Sons MD   Signed by:   Birdie Sons MD on 04/21/2009   Method used:   [facility]        [phone][facility]       [address] [place], [place]  [address]       Ph: [phone] or [phone]       Fax: [phone]   RxID:   [id number]

## 2010-10-04 NOTE — Assessment & Plan Note (Signed)
Summary: congestion in chest and coughing/mhf   Vital Signs:  Patient Profile:   40 Years Old Female Height:     65.75 inches Temp:     98.3 degrees F Pulse rate:   56 / minute BP sitting:   136 / 92  (left arm)  Vitals Entered By: Gladis Riffle, RN (June 09, 2008 10:49 AM)                 Chief Complaint:  c/o chest congestion, cough, sinus congestion x 2 weeks, and URI symptoms.  History of Present Illness:  URI Symptoms      This is a 40 year old woman who presents with URI symptoms.  The patient reports nasal congestion, purulent nasal discharge, and dry cough, but denies clear nasal discharge, sore throat, productive cough, earache, and sick contacts.  Associated symptoms include fever.  The patient denies stiff neck, dyspnea, and wheezing.  The patient also reports headache.  The patient denies itchy watery eyes, itchy throat, sneezing, seasonal symptoms, response to antihistamine, muscle aches, and severe fatigue.  Risk factors for Strep sinusitis include unilateral facial pain.  The patient denies the following risk factors for Strep sinusitis: unilateral nasal discharge, poor response to decongestant, double sickening, tooth pain, Strep exposure, tender adenopathy, and absence of cough.   Past Medical History: GERD Hyperlipidemia Hypertension Depression  Past Surgical History: open laparotomy Social History: Occupation: Single Regular exercise-yes  Family History: Family History of CAD Female 1st degree relative--mother Family History Diabetes 1st degree relative father, sister, mother Family History High cholesterol Family History Hypertension Father: alive, HTN, DMII, hyperlipidemia Mother: alive--HTN, MI, CAD, hyperlipidemia, ?anemia, lupus Siblings:0 brothers, 1 sister--DMII, hyperlipidemia   no other complaints in a complete ROS     Prior Medication List:  SERTRALINE HCL 100 MG TABS (SERTRALINE HCL) Take 1 tablet by mouth once a day ATENOLOL 25 MG  TABS  (ATENOLOL) 1 by mouth once daily LIPITOR 80 MG TABS (ATORVASTATIN CALCIUM) Take 1 tab by mouth daily or as directed RECLIPSEN 0.15-30 MG-MCG  TABS (DESOGESTREL-ETHINYL ESTRADIOL) Take 1 tablet by mouth once a day   Current Allergies (reviewed today): * NO KNOWN DRUG ALLERGIES GROUP      Physical Exam  General:     Well-developed,well-nourished,in no acute distress; alert,appropriate and cooperative throughout examination Head:     normocephalic and atraumatic.   Eyes:     pupils equal and pupils round.   Nose:     nasal mucosla erythema Neck:     No deformities, masses, or tenderness noted. Lungs:     Normal respiratory effort, chest expands symmetrically. Lungs are clear to auscultation, no crackles or wheezes. Heart:     Normal rate and regular rhythm. S1 and S2 normal without gallop, murmur, click, rub or other extra sounds.    Impression & Recommendations:  Problem # 1:  SINUSITIS- ACUTE-NOS (ICD-461.9) needs treatment call if sxs persist side effects discussed Her updated medication list for this problem includes:    Doxycycline Hyclate 100 Mg Caps (Doxycycline hyclate) .Marland Kitchen... Take 1 tab twice a day   Complete Medication List: 1)  Sertraline Hcl 100 Mg Tabs (Sertraline hcl) .... Take 1 tablet by mouth once a day 2)  Atenolol 25 Mg Tabs (Atenolol) .Marland Kitchen.. 1 by mouth once daily 3)  Lipitor 80 Mg Tabs (Atorvastatin calcium) .... Take 1 tab by mouth daily or as directed 4)  Reclipsen 0.15-30 Mg-mcg Tabs (Desogestrel-ethinyl estradiol) .... Take 1 tablet by mouth once a  day 5)  Doxycycline Hyclate 100 Mg Caps (Doxycycline hyclate) .... Take 1 tab twice a day 6)  Diflucan 150 Mg Tabs (Fluconazole) .... Once daily    Prescriptions: DIFLUCAN 150 MG TABS (FLUCONAZOLE) once daily  #1 x 0   Entered and Authorized by:   Birdie Sons MD   Signed by:   Birdie Sons MD on 06/09/2008   Method used:   Electronically to        [facility]  [phone]* (retail)        [address]       [place], [place]  [address]       Ph: [phone] or [phone]       Fax: [phone]   RxID:   [id number] DOXYCYCLINE HYCLATE 100 MG CAPS (DOXYCYCLINE HYCLATE) Take 1 tab twice a day  #20 x 0   Entered and Authorized by:   Birdie Sons MD   Signed by:   Birdie Sons MD on 06/09/2008   Method used:   Electronically to        [facility]  [phone]* (retail)       [address]       [place], [place]  [address]       Ph: [phone] or [phone]       Fax: [phone]   RxID:   [id number]  ]

## 2010-10-04 NOTE — Procedures (Signed)
Summary: Gastroenterology EGD  Gastroenterology EGD   Imported By: Christie Nottingham 11/11/2007 09:36:32  _____________________________________________________________________  External Attachment:    Type:   Image     Comment:   External Document

## 2010-10-04 NOTE — Progress Notes (Signed)
Summary: enema for effexor constipation? unidentified line - LMTCB   Phone Note Call from Patient Call back at [facility] Phone [phone]   Caller: Patient Call For: SWORDS Summary of Call: SIDE EFFECT FROM EFFEXOR: CONSTIPATION AND ABD. PAIN CAN SHE DO A ENEMA OR WHAT SHOULD SHE DO? [facility] Initial call taken by: Barnie Mort,  April 21, 2007 11:12 AM  Follow-up for Phone Call        Abd cramping since Sat.  Yesterday took OTC [employer] laxative.  Has increased veg & fruit 1 day & will + increased fluids & exercise.  Takes a daily fiber pill.  But for abd cramping and pressure, thinks an enema would help this time.  Hemorrhoids are also flared.  Will use Prep H.Discussed all.  Enema? Follow-up by: Rudy Jew, RN,  April 21, 2007 11:28 AM  Additional Follow-up for Phone Call Additional follow up Details #1::        ok to use enema Additional Follow-up by: Birdie Sons MD,  April 21, 2007 3:18 PM    Additional Follow-up for Phone Call Additional follow up Details #2::    Advice called to patient - left message- okay.  Line not identified. Follow-up by: Rudy Jew, RN,  April 21, 2007 4:07 PM  Additional Follow-up for Phone Call Additional follow up Details #3:: Details for Additional Follow-up Action Taken: PATIENT RETURNING CALL FROM NURSE   *****PLEASE CALL [phone]******  CALLED PATIENT AT 4:55P.M. TO MAKE SURE SHE RCVD MESS. AND TOLD HER IF SHE DOES NOT HAVE ANY RELIEF TO CALL EARLY THURS. MORNING/NTA  Additional Follow-up by: Barnie Mort,  April 22, 2007 9:00 AM

## 2010-10-04 NOTE — Assessment & Plan Note (Signed)
Summary: 3 MNTH ROA-SMM/PT RESCD/CCM  Medications Added SIMVASTATIN 40 MG  TABS (SIMVASTATIN) two once daily ATENOLOL-CHLORTHALIDONE 50-25 MG TABS (ATENOLOL-CHLORTHALIDONE) 1/2 once daily SERTRALINE HCL 100 MG TABS (SERTRALINE HCL) Take 1 tablet by mouth once a day ATENOLOL 25 MG  TABS (ATENOLOL)  ATENOLOL 25 MG  TABS (ATENOLOL) 1/2 by mouth once daily LIPITOR 40 MG  TABS (ATORVASTATIN CALCIUM)       Allergies Added: * NO KNOWN DRUG ALLERGIES GROUP  Vital Signs:  Patient Profile:   40 Years Old Female Weight:      206 pounds Temp:     97.9 degrees F Pulse rate:   62 / minute BP sitting:   106 / 70  (left arm)  Vitals Entered By: Gladis Riffle, RN (August 18, 2007 10:38 AM)                 Chief Complaint:  3 month rov--states zoloft is working--put self back on BP med due to systolic running in 90's--needs refill.  History of Present Illness: Pt. here for follow up of recent changes in medications. 1. lipids  simvastatin dose was doubled to 80mg  at last visit.  LDL still 150's  no muscle pains or other side effects. 2. HTN patient had consistent home diastolic pressures over 90 so started back on 1/2 of her previous atenolol dose. 3. Patient switched from effexor back to zoloft 100 qday due to weight gain on effexor.  No mood, sleep, appetite, concentration, or other depression symptoms. 4. Patient would like to change OCPs.  Several months ago, she changed from orthonova to yaz due to cramping.  Since then, she has been having menorrhagia with periods lasting 2 weeks out of the month.  Current Allergies: * NO KNOWN DRUG ALLERGIES GROUP  Past Medical History:    Reviewed history from 04/01/2007 and no changes required:       GERD       Hyperlipidemia       Hypertension       Depression  Past Surgical History:    Reviewed history from 02/06/2007 and no changes required:       open laparotomy   Social History:    Reviewed history from 04/24/2007 and no changes  required:       Occupation:       Single       Regular exercise-yes    Review of Systems       no other complaints in a complete ROS    Physical Exam  General:     alert, well-developed, and well-nourished.   Head:     normocephalic and atraumatic.   Neck:     No deformities, masses, or tenderness noted. Lungs:     Normal respiratory effort, chest expands symmetrically. Lungs are clear to auscultation, no crackles or wheezes. Heart:     Normal rate and regular rhythm. S1 and S2 normal without gallop, murmur, click, rub or other extra sounds. Pulses:     R radial normal and L radial normal.   Extremities:     No clubbing, cyanosis, edema, or deformity noted with normal full range of motion of all joints.      Impression & Recommendations:  Problem # 1:  DEPRESSION (ICD-311) Patient is doing well on her sertraline 100qday.  Will continue.  The following medications were removed from the medication list:    Effexor Xr 75 Mg Cp24 (Venlafaxine hcl) ..... Once daily  Her updated medication list for this problem  includes:    Sertraline Hcl 100 Mg Tabs (Sertraline hcl) .Marland Kitchen... Take 1 tablet by mouth once a day   Problem # 2:  HYPERTENSION (ICD-401.9) Patient's BP was excellent today on 1/2 of a 25mg  atenolol qday.  Will continue this.  Her updated medication list for this problem includes:    Atenolol 25 Mg Tabs (Atenolol)  The following medications were removed from the medication list:    Atenolol-chlorthalidone 50-25 Mg Tabs (Atenolol-chlorthalidone) .Marland Kitchen... 1/2 once daily  Her updated medication list for this problem includes:    Atenolol 25 Mg Tabs (Atenolol) .Marland Kitchen... 1/2 by mouth once daily   Problem # 3:  HYPERLIPIDEMIA (ICD-272.4) Because patient's lipids failed to improve on maximal dose of simvastatin, and her insurrance now covers lipitor we will start her back on lipitor 40 and see her in 2 months for labs.  The following medications were removed from the  medication list:    Simvastatin 40 Mg Tabs (Simvastatin) .Marland Kitchen..Marland Kitchen Two once daily  Her updated medication list for this problem includes:    Lipitor 40 Mg Tabs (Atorvastatin calcium)   Problem # 4:  MENORRHAGIA (ICD-626.2) Because the patient has not been off OCPs in 14 years and is not currently sexually active, we will take her off her OCPs for a cycle and see her back to reassess her symptoms.  Her updated medication list for this problem includes:    Yaz 3-0.02 Mg Tabs (Drospirenone-ethinyl estradiol) ..... One by mouth daily   Complete Medication List: 1)  Yaz 3-0.02 Mg Tabs (Drospirenone-ethinyl estradiol) .... One by mouth daily 2)  Sertraline Hcl 100 Mg Tabs (Sertraline hcl) .... Take 1 tablet by mouth once a day 3)  Atenolol 25 Mg Tabs (Atenolol) .... 1/2 by mouth once daily 4)  Lipitor 40 Mg Tabs (Atorvastatin calcium)   Patient Instructions: 1)  Please schedule a follow-up appointment in 2 months. 2)  Hepatic Panel prior to visit, ICD-9: 3)  Lipid Panel prior to visit, ICD-9: 272.4    Prescriptions: ATENOLOL 25 MG  TABS (ATENOLOL) 1/2 by mouth once daily  #30 x 3   Entered by:   Gladis Riffle, RN   Authorized by:   Birdie Sons MD   Signed by:   Gladis Riffle, RN on 08/18/2007   Method used:   Electron[facility] sent to ...       [id number]       [address]       [place], [place]  [address]       Ph: [phone] or [phone]       Fax: [phone]   RxID:   [id number] SERTRALINE HCL 100 MG TABS (SERTRALINE HCL) Take 1 tablet by mouth once a day  #30 x 3   Entered by:   Glyn Ade MD   Authorized by:   Birdie Sons MD   Signed by:   Glyn Ade MD on 08/18/2007   Method used:   Electronically sent to ...       [id number]       [address]       [place], [place]  [address]       Ph: [phone] or [phone]       Fax: [phone]   RxID:   [id number] ATENOLOL 25 MG  TABS (ATENOLOL)    #30 x 3   Entered by:  Glyn Ade MD   Authorized by:   Birdie Sons MD   Signed by:   Glyn Ade MD on 08/18/2007   Method used:   Electronically sent to ...       [id number]       [address]       [place], [place]  [address]       Ph: [phone] or [phone]       Fax: [phone]   RxID:   [id number]  ]

## 2010-10-25 ENCOUNTER — Other Ambulatory Visit: Payer: Self-pay | Admitting: Internal Medicine

## 2010-12-12 ENCOUNTER — Ambulatory Visit (INDEPENDENT_AMBULATORY_CARE_PROVIDER_SITE_OTHER): Payer: BLUE CROSS/BLUE SHIELD | Admitting: Family Medicine

## 2010-12-12 ENCOUNTER — Encounter: Payer: Self-pay | Admitting: Family Medicine

## 2010-12-12 VITALS — BP 120/90 | Temp 98.9°F | Ht 65.25 in | Wt 224.0 lb

## 2010-12-12 DIAGNOSIS — B349 Viral infection, unspecified: Secondary | ICD-10-CM

## 2010-12-12 DIAGNOSIS — B9789 Other viral agents as the cause of diseases classified elsewhere: Secondary | ICD-10-CM

## 2010-12-12 MED ORDER — HYDROCODONE-HOMATROPINE 5-1.5 MG/5ML PO SYRP: 5.0000 mL | ORAL_SOLUTION | Freq: Four times a day (QID) | ORAL | Status: DC | PRN

## 2010-12-12 NOTE — Progress Notes (Signed)
  Subjective:    Patient ID: [name], female    DOB: 11-May-1971, 40 y.o.   MRN: [id number]  HPI Patient seen with viral type illness. Started last Friday. This past Monday went to her employers work clinic and nasal swab reportedly positive for influenza. Still some body aches and malaise. Cough occasionally productive of yellow sputum. No documented fever past couple days. No chills. Denies any nausea, vomiting, or diarrhea. She was not started on Tamiflu as she was over 48 hours into illness when seen.   Review of Systems  Constitutional: Positive for fatigue. Negative for fever and chills.  HENT: Negative for sore throat.   Respiratory: Positive for cough. Negative for shortness of breath and wheezing.   Cardiovascular: Negative for chest pain, palpitations and leg swelling.  Gastrointestinal: Negative for vomiting, abdominal pain and diarrhea.  Skin: Negative for rash.       Objective:   Physical Exam  Constitutional: She appears well-developed and well-nourished.  HENT:  Head: Normocephalic.  Right Ear: External ear normal.  Left Ear: External ear normal.  Nose: Nose normal.  Mouth/Throat: Oropharynx is clear and moist. No oropharyngeal exudate.  Neck: No thyromegaly present.  Cardiovascular: Normal rate, regular rhythm and normal heart sounds.   No murmur heard. Pulmonary/Chest: Effort normal and breath sounds normal. No respiratory distress. She has no wheezes. She has no rales.  Lymphadenopathy:    She has no cervical adenopathy.          Assessment & Plan:  Viral syndrome. Reportedly tested positive for influenza. No signs or symptoms of bacterial illness at this time. Hycodan for cough suppression. Follow up promptly for any fever worsening symptoms

## 2010-12-12 NOTE — Patient Instructions (Signed)
Viral Syndrome   You or your child has Viral Syndrome. It is the most common infection causing “colds” and infections in the nose, throat, sinuses, and breathing tubes. Sometimes the infection causes nausea, vomiting, or diarrhea. The germ that causes the infection is a virus. No antibiotic or other medicine will kill it. There are medicines that you can take to make you or your child more comfortable.   HOME CARE INSTRUCTIONS   Rest in bed until you start to feel better.   If you have diarrhea or vomiting, eat small amounts of crackers and toast. Soup is helpful.   For children, DO NOT give aspirin or medicine that contains aspirin.   Only take over-the-counter or prescription medicines for pain, discomfort, or fever as directed by your caregiver.   SEEK MEDICAL CARE IF:   You or your child has not improved within one week.   You or your child has pain that is not at least partially relieved by over-the-counter medicine.   Thick, colored mucus or blood is coughed up.   Discharge from the nose becomes thick yellow or green.   Diarrhea or vomiting gets worse.   There is any major change in your or your child’s condition.   You or your child develops a skin rash, stiff neck, severe headache, or are unable to hold down food or fluid.   You or your child has an oral temperature above 101, not controlled by medicine.   Your baby is older than 3 months with a rectal temperature of 102º F (38.9º C) or higher.   Your baby is 3 months old or younger with a rectal temperature of 100.4º F (38º C) or higher.   Document Released: 08/04/2006 Document Re-Released: 02/06/2010   ExitCare® Patient Information ©2011 [employer].

## 2011-01-15 NOTE — Assessment & Plan Note (Signed)
[facility]                         GASTROENTEROLOGY OFFICE NOTE   NAME:Stillson, JAYLEI FUERTE                 MRN:          [id number]  DATE:06/11/2007                            DOB:          01-01-1971    Ms. Shepherd is a 40 year old white female whom we saw in the past for  aspirin-induced gastropathy, anal fissure in November 2002.  She has a  history of irritable bowel syndrome, and most recently has had irregular  bowel habits.  She saw Dr. Cato Mulligan about 2 months ago with left lower  quadrant abdominal pain, and was treated for diverticulitis with Flagyl  and Cipro.  Her symptoms have mostly subsided, but she has some residual  left-sided tenderness.  She also is complaining of rectal soreness and  hemorrhoids.  We have treated her for anal fissure in 2002, and again in  2004.  She had constipation, for which she took laxatives, most recently  MiraLax.   MEDICATIONS:  1. Zoloft 100 mg daily.  2. Atenolol 25 mg p.o. daily.  3. Simvastatin 50 mg p.o. daily.   PHYSICAL EXAMINATION:  Blood pressure 110/72.  Pulse 72.  Weight 210  pounds.  She was alert, oriented, and in no distress.  LUNGS:  Clear to auscultation.  COR:  Normal S1 and S2.  NECK:  Supple.  Thyroid not enlarged.  ABDOMEN:  Soft, obese, mildly tender in left lower and left middle  quadrants.  No palpable mass.  No fullness.  No rebound.  Her right  lower and right upper quadrants were unremarkable.  RECTAL AND ANOSCOPIC EXAM:  Reveals large external hemorrhoidal tags  with firm papilloma-like protrusions from the rectum.  Nontender.  No  evidence of bleeding.  Anal canal itself appears normal.  There were  small, less than 1st grade, internal hemorrhoids circumferentially.  These do not show any stigmata of bleeding.  Stool is Hemoccult  negative.   IMPRESSION:  88. A 40 year old white female with irritable bowel syndrome, varying      bowel habits with predominant  constipation.  2. History of anal fissure, now symptomatic hemorrhoids, mostly      external.  3. Left lower quadrant abdominal discomfort, most likely related to      irritable bowel syndrome, although I cannot absolutely rule out      possible diverticulitis.  She is too young to have significant      diverticular disease.   PLAN:  1. High-fiber diet.  2. Benefiber 1 pack day, which will give her an extra 3.5 gm of fiber      a day.  3. Anusol HC suppository nightly.  4. Analpram cream 2.5% to use twice a day p.r.n. rectal irritation.      If her symptoms continue, I would consider colonoscopy.  Also,      would consider using antispasmodics.     Hedwig Morton. Juanda Chance, MD  Electronically Signed    DMB/MedQ  DD: 06/11/2007  DT: 06/11/2007  Job #: [id number]   cc:   Valetta Mole. Swords, MD

## 2011-01-15 NOTE — Op Note (Signed)
[name], [name]          ACCOUNT NO.:  [id number]   MEDICAL RECORD NO.:  [id number]          PATIENT TYPE:  AMB   LOCATION:  SDC                           FACILITY:  [facility]   PHYSICIAN:  Sherron Monday, MD        DATE OF BIRTH:  [birth year]   DATE OF PROCEDURE:  04/22/2008  DATE OF DISCHARGE:                               OPERATIVE REPORT   PREOPERATIVE DIAGNOSIS:  Prolapsed uterine fibroid.   POSTOPERATIVE DIAGNOSIS:  Prolapsed uterine fibroid.   PROCEDURES:  Resection of fibroid, dilatation and curettage.   SURGEON:  Sherron Monday, MD   ANESTHESIA:  MAC with 10 mL 0.25% marcaine for paracervical block.   FINDINGS:  A 2 cm x 3.5 cm prolapsed fibroid, cervix dilated to  approximately 3 cm, normal mid position uterus.   SPECIMEN:  Fibroid to pathology.   ESTIMATED BLOOD LOSS:  Minimal.   IV FLUIDS:  1200 mL.   URINE OUTPUT:  I&O cath at the start of the procedure.   COMPLICATIONS:  None.   DISPOSITION:  Stable to PACU.   DESCRIPTION OF PROCEDURE:  After informed consent was reviewed with the  patient including the risks, benefits, and alternatives of the surgical  procedure, she was transported in stable condition to the OR, placed on  the table in supine position.  MAC anesthesia was induced and found to  be adequate.  She was then prepped and draped in the normal sterile  fashion and placed in the [place] stirrups.  Her bladder was sterilely  drained.  Using an open-sided speculum.  The fibroid was inspected,  grasped with a ring forceps and resected.  A curettage of the uterus was  then performed revealing gritty texture in all 4 quadrants and then  curettaged necrotic tissue and dark blood were  noted.  Instruments were removed from the vagina.  Bleeding was found to  be within normal limits.  She was awakened in stable condition and  transferred to the PACU.  Sponge, lap, and needle counts were correct x2  at the end of the procedure.      Sherron Monday, MD  Electronically Signed     JB/MEDQ  D:  04/22/2008  T:  04/23/2008  Job:  [id number]

## 2011-01-15 NOTE — H&P (Signed)
[name], [name]          ACCOUNT NO.:  [id number]   MEDICAL RECORD NO.:  [id number]          PATIENT TYPE:  AMB   LOCATION:  [facility]                           [facility]   PHYSICIAN:  Sherron Monday, MD        DATE OF BIRTH:  [birth year]   DATE OF ADMISSION:  DATE OF DISCHARGE:                              HISTORY & PHYSICAL   PROCEDURE PLANNED:  1. Resection of prolapsed uterine fibroid.  2. Possible hysteroscopy.  3. Dilitation and curretage   HISTORY OF PRESENT ILLNESS:  This is a 40 year old female with intense  cramping several days ago as well as increased bleeding and passing  clots and discomfort with intercourse and inability to wear a tampon,  who recently saw her general doctor and he sent her to a gynecologist  because on exam, she had a cervical cyst.  On exam, she has what  appears to be a prolapsed uterine fibroid.  A biopsy was taken of this  and it was consistent with a leiomyoma.  It was benign smooth muscle  with some surface denervation and acute inflammation.  Malignant  features were not identified.   PAST MEDICAL HISTORY:  Significant for hypertension and  hypercholesterolemia as well as depression.   PAST SURGICAL HISTORY:  Significant for a laparoscope and laparotomy for  an ovarian cystectomy.   PAST OB/GYN HISTORY:  She is a G0 with no history of any abnormal Pap  smears.  No sexually transmitted diseases.  She is sexually active with  a single partner, has regular menstrual cycles with Yaz, and has some  cramping.  Aside from this incident, no pain or problems with  intercourse.  She has had intermenstrual bleeding only when missed her  pills.  No postcoital bleeding.   MEDICATIONS:  Include Tenoretic, lovastatin, Zoloft, and contraceptive  pills.   ALLERGIES:  No known drug allergies.   SOCIAL HISTORY:  Negative for tobacco or drug use.  She is an occasional  alcohol user, is single, and works as an Airline pilot.   FAMILY HISTORY:   Significant for mother with bipolar disorder, coronary  artery disease, lupus, fibromyalgia, hypercholesterolemia, and diabetes  as well as rheumatoid arthritis.  Father with hypercholesterolemia,  diabetes, and hypertension.  Paternal grandfather with colon cancer.  Maternal grandfather with cirrhosis secondary to alcohol.  Paternal  grandmother with skin cancer.  Maternal grandmother with heart disease.   PHYSICAL EXAMINATION:  GENERAL:  She is afebrile.  VITAL SIGNS:  Stable with no apparent distress.  CARDIOVASCULAR:  Regular rate and rhythm.  LUNGS:  Clear to auscultation bilaterally.  ABDOMEN:  Soft, nontender, and nondistended with good bowel sounds.  EXTREMITIES:  Symmetric and nontender.  HEENT:  Mucous membranes are moist.  NECK:  No lymphadenopathy.  Thyroid gland within normal limits.  BACK:  No costovertebral angle tenderness.  PELVIC:  Normal external female genitalia.  Normal Bartholin, urethra,  and Skene glands.  Cervix with a 2 x 4-cm mass with irregularity  protruding through.  It is firm and consistent with a fibroid, biopsy  was taken and was proven to be a fibroid.  Otherwise, no cervical or  vaginal lesions.  Uterus is thought to be of upper limits of normal size  and mass is thought to be in her uterus as well.  Adnexa is difficult to  palpate given the patient's discomfort.   ASSESSMENT AND PLAN:  A 40 year old Caucasian female with prolapsed  uterine fibroid for a resection, D&C and possible hysteroscopy on April 22, 2008.  We discussed with the patient risks, benefits, and  alternatives of the surgical procedure.  She voices understanding to all  this and wishes to proceed.       Sherron Monday, MD  Electronically Signed     JB/MEDQ  D:  04/21/2008  T:  04/22/2008  Job:  [id number]

## 2011-01-17 ENCOUNTER — Other Ambulatory Visit: Payer: Self-pay | Admitting: Internal Medicine

## 2011-01-18 ENCOUNTER — Other Ambulatory Visit: Payer: Self-pay | Admitting: Internal Medicine

## 2011-01-21 ENCOUNTER — Telehealth: Payer: Self-pay | Admitting: *Deleted

## 2011-01-21 NOTE — Telephone Encounter (Signed)
refilled 

## 2011-05-09 ENCOUNTER — Encounter (HOSPITAL_COMMUNITY)
Admission: RE | Admit: 2011-05-09 | Discharge: 2011-05-09 | Disposition: A | Discharge status: Home / Self Care | Payer: BC Managed Care – PPO | Source: Ambulatory Visit | Attending: Obstetrics and Gynecology | Admitting: Obstetrics and Gynecology

## 2011-05-09 ENCOUNTER — Encounter (HOSPITAL_COMMUNITY): Payer: Self-pay

## 2011-05-09 HISTORY — DX: Depression, unspecified: F32.A

## 2011-05-09 HISTORY — DX: Other specified postprocedural states: Z98.890

## 2011-05-09 HISTORY — DX: Essential (primary) hypertension: I10

## 2011-05-09 HISTORY — DX: Other specified postprocedural states: R11.2

## 2011-05-09 HISTORY — DX: Major depressive disorder, single episode, unspecified: F32.9

## 2011-05-09 HISTORY — DX: Anxiety disorder, unspecified: F41.9

## 2011-05-09 LAB — CBC
HCT: 38.8 % (ref 36.0–46.0)
Hemoglobin: 13 g/dL (ref 12.0–15.0)
MCH: 29.8 pg (ref 26.0–34.0)
MCHC: 33.5 g/dL (ref 30.0–36.0)
MCV: 89 fL (ref 78.0–100.0)
Platelets: 352 10*3/uL (ref 150–400)
RBC: 4.36 MIL/uL (ref 3.87–5.11)
RDW: 12.6 % (ref 11.5–15.5)
WBC: 10.6 10*3/uL — ABNORMAL HIGH (ref 4.0–10.5)

## 2011-05-09 LAB — SURGICAL PCR SCREEN
MRSA, PCR: NEGATIVE
Staphylococcus aureus: NEGATIVE

## 2011-05-09 NOTE — H&P (Signed)
NAMEKRISTINIA, Dougherty NO.:  [id number]  MEDICAL RECORD NO.:  [id number]  LOCATION:  [facility]                           FACILITY:  [facility]  PHYSICIAN:  Duke Salvia. Marcelle Overlie, M.D.DATE OF BIRTH:  12-02-1970  DATE OF ADMISSION:  05/09/2011 DATE OF DISCHARGE:                             HISTORY & PHYSICAL   DATE OF SCHEDULED SURGERY:  May 16, 2011.  CHIEF COMPLAINT:  Menorrhagia, anemia with symptomatic leiomyoma.  HISTORY OF PRESENT ILLNESS:  A 40 year old married G0, P0, currently on Sprintec for contraception and was seen by her nurse practitioner, Julio Sicks, in July 2012, complaining of menorrhagia and anemia.  Hemoglobin at that time was 10.8.  Ultrasound was ordered that showed leiomyoma. She was started on iron at that point.  FSG was performed to clarify the number, location of the fibroids, that was done on April 18, 2011, showing multiple fibroids, the largest 5 cm several of that were pedunculated and two that were intracavitary 2.70 and 2.1 cm.  After discussion of the option, she presents now for myomectomy.  This procedure including risks related to bleeding, infection, transfusion, adjacent organ injury, the need for delivery by cesarean section along with her recovery time all discussed.  PAST MEDICAL HISTORY:  None.  ALLERGIES:  None.  CURRENT MEDICATIONS:  Trazodone, Xanax p.r.n., amlodipine benazepril, Sprintec, Lipitor, and Zoloft.  PAST SURGERIES:  She has had a prior Pfannenstiel incision for right ovarian cystectomy.  FAMILY HISTORY:  Significant for migraine, heart disease, asthma, IBS, anemia, UTI, osteoporosis, arthritis, diabetes, hypertension, and colon cancer.  SOCIAL HISTORY:  Denies drug or tobacco use on to two drinks per week. She is married.  Her medical doctor is Dr. Birdie Sons.  PHYSICAL EXAMINATION:  VITAL SIGNS:  Temperature 98.2, blood pressure 128/88. HEENT: Unremarkable. NECK:  Supple without masses. LUNGS:   Clear. CARDIOVASCULAR:  Regular rate and rhythm without murmurs, rubs, or gallops. BREASTS:  Without masses. ABDOMEN:  Soft, flat, nontender. PELVIS:  Normal external genitalia.  High vaginal swab was clear. Uterus 8-week size, mid position.  Adnexa negative. EXTREMITIES:  Unremarkable. NEUROLOGIC:  Unremarkable.  IMPRESSION:  Symptomatic leiomyoma with anemia.  PLAN:  Myomectomy procedure and risks reviewed as above.     Princessa Lesmeister M. Marcelle Overlie, M.D.     RMH/MEDQ  D:  05/09/2011  T:  05/09/2011  Job:  [id number]

## 2011-05-09 NOTE — Patient Instructions (Addendum)
20 [name]  05/09/2011   Your procedure is scheduled on:  05/16/11  Enter through the Main Entrance of [facility] at 6 AM.  Pick up the phone at the desk and dial [phone].   Call this number if you have problems the morning of surgery: [phone]   Remember:   Do not eat food:After Midnight.  Do not drink clear liquids: After Midnight.  Take these medicines the morning of surgery with A SIP OF WATER: Blood pressure medication, Zoloft, Lipitor, BCP   AS EARLY AS POSSIBLE WITH THE LEAST AMOUNT OF WATER.   Do not wear jewelry, make-up or nail polish.  Do not wear lotions, powders, or perfumes. You may wear deodorant.  Do not shave 48 hours prior to surgery.  Do not bring valuables to the hospital.  Contacts, dentures or bridgework may not be worn into surgery.  Leave suitcase in the car. After surgery it may be brought to your room.  For patients admitted to the hospital, checkout time is 11:00 AM the day of discharge.   Patients discharged the day of surgery will not be allowed to drive home.  Name and phone number of your driver: NA  Special Instructions: CHG Shower Use Special Wash: 1/2 bottle night before surgery and 1/2 bottle morning of surgery.   Please read over the following fact sheets that you were given: Care and Recovery After Surgery

## 2011-05-09 NOTE — H&P (Signed)
Lorrayne Ismael Glahn  DICTATION # [id number] CSN# [id number]   Meriel Pica, MD 05/09/2011 2:49 PM

## 2011-05-16 ENCOUNTER — Inpatient Hospital Stay (HOSPITAL_COMMUNITY): Payer: BC Managed Care – PPO | Admitting: Anesthesiology

## 2011-05-16 ENCOUNTER — Encounter (HOSPITAL_COMMUNITY): Payer: Self-pay | Admitting: Anesthesiology

## 2011-05-16 ENCOUNTER — Encounter (HOSPITAL_COMMUNITY)
Admission: RE | Disposition: A | Discharge status: Home / Self Care | Payer: Self-pay | Source: Ambulatory Visit | Attending: Obstetrics and Gynecology

## 2011-05-16 ENCOUNTER — Inpatient Hospital Stay (HOSPITAL_COMMUNITY)
Admission: RE | Admit: 2011-05-16 | Discharge: 2011-05-18 | DRG: 359 | Disposition: A | Discharge status: Home / Self Care | Payer: BC Managed Care – PPO | Source: Ambulatory Visit | Attending: Obstetrics and Gynecology | Admitting: Obstetrics and Gynecology

## 2011-05-16 ENCOUNTER — Other Ambulatory Visit: Payer: Self-pay | Admitting: Obstetrics and Gynecology

## 2011-05-16 DIAGNOSIS — Z01812 Encounter for preprocedural laboratory examination: Secondary | ICD-10-CM

## 2011-05-16 DIAGNOSIS — D250 Submucous leiomyoma of uterus: Secondary | ICD-10-CM | POA: Diagnosis present

## 2011-05-16 DIAGNOSIS — Z01818 Encounter for other preprocedural examination: Secondary | ICD-10-CM

## 2011-05-16 DIAGNOSIS — D252 Subserosal leiomyoma of uterus: Principal | ICD-10-CM | POA: Diagnosis present

## 2011-05-16 DIAGNOSIS — D649 Anemia, unspecified: Secondary | ICD-10-CM | POA: Diagnosis present

## 2011-05-16 HISTORY — PX: MYOMECTOMY: SHX85

## 2011-05-16 LAB — PREGNANCY, URINE: Preg Test, Ur: NEGATIVE

## 2011-05-16 SURGERY — MYOMECTOMY, ABDOMINAL APPROACH
Anesthesia: General

## 2011-05-16 MED ORDER — METOCLOPRAMIDE HCL 5 MG/ML IJ SOLN: 10.0000 mg | Freq: Once | INTRAMUSCULAR | Status: DC | PRN

## 2011-05-16 MED ORDER — MIDAZOLAM HCL 2 MG/2ML IJ SOLN
INTRAMUSCULAR | Status: AC
  Filled 2011-05-16: qty 2

## 2011-05-16 MED ORDER — HYDROMORPHONE HCL 1 MG/ML IJ SOLN
INTRAMUSCULAR | Status: AC
  Administered 2011-05-16: 0.5 mg via INTRAVENOUS
  Filled 2011-05-16: qty 1

## 2011-05-16 MED ORDER — ROCURONIUM BROMIDE 50 MG/5ML IV SOLN
INTRAVENOUS | Status: AC
  Filled 2011-05-16: qty 1

## 2011-05-16 MED ORDER — FENTANYL CITRATE 0.05 MG/ML IJ SOLN
INTRAMUSCULAR | Status: AC
  Filled 2011-05-16: qty 5

## 2011-05-16 MED ORDER — MORPHINE SULFATE (PF) 1 MG/ML IV SOLN
INTRAVENOUS | Status: DC
  Administered 2011-05-16: 7.5 mL via INTRAVENOUS
  Administered 2011-05-16: 25 mg via INTRAVENOUS
  Administered 2011-05-16: 15.92 mg via INTRAVENOUS
  Administered 2011-05-16: 17 mL via INTRAVENOUS
  Administered 2011-05-16: 25 mg via INTRAVENOUS
  Administered 2011-05-17: 8 mg via INTRAVENOUS
  Administered 2011-05-17: 9 mg via INTRAVENOUS
  Filled 2011-05-16 (×2): qty 25

## 2011-05-16 MED ORDER — KETOROLAC TROMETHAMINE 30 MG/ML IJ SOLN: 30.0000 mg | Freq: Once | INTRAMUSCULAR | Status: DC

## 2011-05-16 MED ORDER — VASOPRESSIN 20 UNIT/ML IJ SOLN
INTRAVENOUS | Status: DC | PRN
  Administered 2011-05-16: 08:00:00 via INTRAMUSCULAR

## 2011-05-16 MED ORDER — LIDOCAINE HCL (CARDIAC) 20 MG/ML IV SOLN
INTRAVENOUS | Status: AC
  Filled 2011-05-16: qty 5

## 2011-05-16 MED ORDER — SERTRALINE HCL 100 MG PO TABS
100.0000 mg | ORAL_TABLET | Freq: Every day | ORAL | Status: DC
  Administered 2011-05-17 – 2011-05-18 (×2): 100 mg via ORAL
  Filled 2011-05-16 (×3): qty 1

## 2011-05-16 MED ORDER — NORGESTIMATE-ETH ESTRADIOL 0.25-35 MG-MCG PO TABS
1.0000 | ORAL_TABLET | Freq: Every day | ORAL | Status: DC
  Administered 2011-05-17 – 2011-05-18 (×2): 1 via ORAL

## 2011-05-16 MED ORDER — GLYCOPYRROLATE 0.2 MG/ML IJ SOLN
INTRAMUSCULAR | Status: DC | PRN
  Administered 2011-05-16: .6 mg via INTRAVENOUS
  Administered 2011-05-16: 0.2 mg via INTRAVENOUS

## 2011-05-16 MED ORDER — AMLODIPINE BESY-BENAZEPRIL HCL 5-10 MG PO CAPS: 1.0000 | ORAL_CAPSULE | Freq: Every day | ORAL | Status: DC

## 2011-05-16 MED ORDER — MIDAZOLAM HCL 5 MG/5ML IJ SOLN
INTRAMUSCULAR | Status: DC | PRN
  Administered 2011-05-16 (×2): 1 mg via INTRAVENOUS

## 2011-05-16 MED ORDER — ROCURONIUM BROMIDE 100 MG/10ML IV SOLN
INTRAVENOUS | Status: DC | PRN
  Administered 2011-05-16: 50 mg via INTRAVENOUS

## 2011-05-16 MED ORDER — SCOPOLAMINE 1 MG/3DAYS TD PT72
MEDICATED_PATCH | TRANSDERMAL | Status: AC
  Administered 2011-05-16: 1.5 mg
  Filled 2011-05-16: qty 1

## 2011-05-16 MED ORDER — ONDANSETRON HCL 4 MG/2ML IJ SOLN: 4.0000 mg | Freq: Four times a day (QID) | INTRAMUSCULAR | Status: DC | PRN

## 2011-05-16 MED ORDER — ONDANSETRON HCL 4 MG/2ML IJ SOLN
INTRAMUSCULAR | Status: AC
  Filled 2011-05-16: qty 2

## 2011-05-16 MED ORDER — CEFAZOLIN SODIUM 1-5 GM-% IV SOLN
1.0000 g | INTRAVENOUS | Status: AC
  Administered 2011-05-16: 1 g via INTRAVENOUS

## 2011-05-16 MED ORDER — BUPIVACAINE HCL (PF) 0.5 % IJ SOLN
INTRAMUSCULAR | Status: DC | PRN
  Administered 2011-05-16: 270 mL

## 2011-05-16 MED ORDER — LIDOCAINE HCL 1 % IJ SOLN
INTRAMUSCULAR | Status: DC | PRN
  Administered 2011-05-16: 10 mL

## 2011-05-16 MED ORDER — FENTANYL CITRATE 0.05 MG/ML IJ SOLN
INTRAMUSCULAR | Status: AC
  Filled 2011-05-16: qty 2

## 2011-05-16 MED ORDER — DEXAMETHASONE SODIUM PHOSPHATE 10 MG/ML IJ SOLN
INTRAMUSCULAR | Status: AC
  Filled 2011-05-16: qty 1

## 2011-05-16 MED ORDER — OXYCODONE-ACETAMINOPHEN 5-325 MG PO TABS
1.0000 | ORAL_TABLET | ORAL | Status: DC | PRN
  Administered 2011-05-17 (×3): 2 via ORAL
  Administered 2011-05-18: 1 via ORAL
  Administered 2011-05-18: 2 via ORAL
  Filled 2011-05-16 (×4): qty 2
  Filled 2011-05-16: qty 1

## 2011-05-16 MED ORDER — MEPERIDINE HCL 25 MG/ML IJ SOLN: 6.2500 mg | INTRAMUSCULAR | Status: DC | PRN

## 2011-05-16 MED ORDER — AMLODIPINE BESYLATE 5 MG PO TABS
5.0000 mg | ORAL_TABLET | Freq: Every day | ORAL | Status: DC
  Administered 2011-05-17 – 2011-05-18 (×2): 5 mg via ORAL
  Filled 2011-05-16 (×3): qty 1

## 2011-05-16 MED ORDER — KETOROLAC TROMETHAMINE 30 MG/ML IJ SOLN
30.0000 mg | Freq: Four times a day (QID) | INTRAMUSCULAR | Status: DC
  Administered 2011-05-16 – 2011-05-17 (×3): 30 mg via INTRAVENOUS
  Filled 2011-05-16 (×3): qty 1

## 2011-05-16 MED ORDER — NALOXONE HCL 0.4 MG/ML IJ SOLN: 0.4000 mg | INTRAMUSCULAR | Status: DC | PRN

## 2011-05-16 MED ORDER — CEFAZOLIN SODIUM 1-5 GM-% IV SOLN
INTRAVENOUS | Status: AC
  Filled 2011-05-16: qty 50

## 2011-05-16 MED ORDER — SIMETHICONE 80 MG PO CHEW: 80.0000 mg | CHEWABLE_TABLET | Freq: Four times a day (QID) | ORAL | Status: DC | PRN

## 2011-05-16 MED ORDER — PROPOFOL 10 MG/ML IV EMUL
INTRAVENOUS | Status: AC
  Filled 2011-05-16: qty 20

## 2011-05-16 MED ORDER — PROPOFOL 10 MG/ML IV EMUL
INTRAVENOUS | Status: DC | PRN
  Administered 2011-05-16: 150 mg via INTRAVENOUS
  Administered 2011-05-16: 30 mg via INTRAVENOUS

## 2011-05-16 MED ORDER — DEXTROSE IN LACTATED RINGERS 5 % IV SOLN
INTRAVENOUS | Status: DC
  Administered 2011-05-16 – 2011-05-17 (×2): via INTRAVENOUS

## 2011-05-16 MED ORDER — SCOPOLAMINE 1 MG/3DAYS TD PT72: 1.0000 | MEDICATED_PATCH | Freq: Once | TRANSDERMAL | Status: DC

## 2011-05-16 MED ORDER — LIDOCAINE HCL (CARDIAC) 20 MG/ML IV SOLN
INTRAVENOUS | Status: DC | PRN
  Administered 2011-05-16: 80 mg via INTRAVENOUS

## 2011-05-16 MED ORDER — BUTORPHANOL TARTRATE 2 MG/ML IJ SOLN: 1.0000 mg | INTRAMUSCULAR | Status: DC | PRN

## 2011-05-16 MED ORDER — DEXAMETHASONE SODIUM PHOSPHATE 4 MG/ML IJ SOLN
INTRAMUSCULAR | Status: DC | PRN
  Administered 2011-05-16: 8 mg via INTRAVENOUS

## 2011-05-16 MED ORDER — SODIUM CHLORIDE 0.9 % IJ SOLN: 9.0000 mL | INTRAMUSCULAR | Status: DC | PRN

## 2011-05-16 MED ORDER — LACTATED RINGERS IV SOLN
INTRAVENOUS | Status: DC
  Administered 2011-05-16 (×4): via INTRAVENOUS

## 2011-05-16 MED ORDER — BENAZEPRIL HCL 10 MG PO TABS
10.0000 mg | ORAL_TABLET | Freq: Every day | ORAL | Status: DC
  Administered 2011-05-17 – 2011-05-18 (×2): 10 mg via ORAL
  Filled 2011-05-16 (×3): qty 1

## 2011-05-16 MED ORDER — BISACODYL 10 MG RE SUPP: 10.0000 mg | Freq: Every day | RECTAL | Status: DC | PRN

## 2011-05-16 MED ORDER — IBUPROFEN 800 MG PO TABS
800.0000 mg | ORAL_TABLET | Freq: Three times a day (TID) | ORAL | Status: DC | PRN
  Administered 2011-05-18: 800 mg via ORAL
  Filled 2011-05-16: qty 1

## 2011-05-16 MED ORDER — ZOLPIDEM TARTRATE 5 MG PO TABS: 5.0000 mg | ORAL_TABLET | Freq: Every evening | ORAL | Status: DC | PRN

## 2011-05-16 MED ORDER — ONDANSETRON HCL 4 MG/2ML IJ SOLN
INTRAMUSCULAR | Status: DC | PRN
  Administered 2011-05-16: 4 mg via INTRAVENOUS

## 2011-05-16 MED ORDER — INFLUENZA VIRUS VACC SPLIT PF IM SUSP
0.5000 mL | Freq: Once | INTRAMUSCULAR | Status: AC
  Administered 2011-05-18: 0.5 mL via INTRAMUSCULAR
  Filled 2011-05-16: qty 0.5

## 2011-05-16 MED ORDER — DEXTROSE IN LACTATED RINGERS 5 % IV SOLN: INTRAVENOUS | Status: DC

## 2011-05-16 MED ORDER — MENTHOL 3 MG MT LOZG
1.0000 | LOZENGE | OROMUCOSAL | Status: DC | PRN
  Filled 2011-05-16: qty 9

## 2011-05-16 MED ORDER — INFLUENZA VAC TYP A&B SURF ANT IM INJ
0.5000 mL | INJECTION | Freq: Once | INTRAMUSCULAR | Status: DC
  Filled 2011-05-16: qty 0.5

## 2011-05-16 MED ORDER — NEOSTIGMINE METHYLSULFATE 1 MG/ML IJ SOLN
INTRAMUSCULAR | Status: DC | PRN
  Administered 2011-05-16: 1.5 mg via INTRAMUSCULAR

## 2011-05-16 MED ORDER — FENTANYL CITRATE 0.05 MG/ML IJ SOLN
INTRAMUSCULAR | Status: DC | PRN
  Administered 2011-05-16: 50 ug via INTRAVENOUS
  Administered 2011-05-16: 25 ug via INTRAVENOUS
  Administered 2011-05-16: 100 ug via INTRAVENOUS
  Administered 2011-05-16: 50 ug via INTRAVENOUS
  Administered 2011-05-16: 25 ug via INTRAVENOUS
  Administered 2011-05-16: 100 ug via INTRAVENOUS

## 2011-05-16 MED ORDER — KETOROLAC TROMETHAMINE 30 MG/ML IJ SOLN
INTRAMUSCULAR | Status: DC | PRN
  Administered 2011-05-16: 30 mg via INTRAVENOUS

## 2011-05-16 MED ORDER — DIPHENHYDRAMINE HCL 12.5 MG/5ML PO ELIX
12.5000 mg | ORAL_SOLUTION | Freq: Four times a day (QID) | ORAL | Status: DC | PRN
  Administered 2011-05-17: 12.5 mg via ORAL
  Filled 2011-05-16: qty 5

## 2011-05-16 MED ORDER — KETOROLAC TROMETHAMINE 30 MG/ML IJ SOLN: 30.0000 mg | Freq: Four times a day (QID) | INTRAMUSCULAR | Status: DC

## 2011-05-16 MED ORDER — HYDROMORPHONE HCL 1 MG/ML IJ SOLN
0.2500 mg | INTRAMUSCULAR | Status: DC | PRN
  Administered 2011-05-16 (×4): 0.5 mg via INTRAVENOUS

## 2011-05-16 MED ORDER — DIPHENHYDRAMINE HCL 50 MG/ML IJ SOLN
12.5000 mg | Freq: Four times a day (QID) | INTRAMUSCULAR | Status: DC | PRN
  Administered 2011-05-17: 12.5 mg via INTRAVENOUS
  Filled 2011-05-16: qty 1

## 2011-05-16 SURGICAL SUPPLY — 46 items
BARRIER ADHS 3X4 INTERCEED (GAUZE/BANDAGES/DRESSINGS) ×1 IMPLANT
BRR ADH 4X3 ABS CNTRL BYND (GAUZE/BANDAGES/DRESSINGS) ×1
CANISTER SUCTION 2500CC (MISCELLANEOUS) ×2 IMPLANT
CATH FOLEY 2WAY  3CC  8FR (CATHETERS)
CATH FOLEY 2WAY 3CC 8FR (CATHETERS) IMPLANT
CLOTH BEACON ORANGE TIMEOUT ST (SAFETY) ×2 IMPLANT
CONT PATH 16OZ SNAP LID 3702 (MISCELLANEOUS) ×2 IMPLANT
DECANTER SPIKE VIAL GLASS SM (MISCELLANEOUS) ×2 IMPLANT
DRESSING TELFA 8X3 (GAUZE/BANDAGES/DRESSINGS) ×1 IMPLANT
DRSG PAD ABDOMINAL 8X10 ST (GAUZE/BANDAGES/DRESSINGS) ×2 IMPLANT
DRSG TEGADERM 2.38X2.75 (GAUZE/BANDAGES/DRESSINGS) IMPLANT
FILTER STRAW FLUID ASPIR (MISCELLANEOUS) IMPLANT
GAUZE SPONGE 4X4 12PLY STRL LF (GAUZE/BANDAGES/DRESSINGS) ×4 IMPLANT
GAUZE SPONGE 4X4 16PLY XRAY LF (GAUZE/BANDAGES/DRESSINGS) ×4 IMPLANT
GLOVE BIO SURGEON STRL SZ7 (GLOVE) ×4 IMPLANT
GOWN PREVENTION PLUS LG XLONG (DISPOSABLE) ×6 IMPLANT
IV STOPCOCK 4 WAY 40  W/Y SET (IV SOLUTION)
IV STOPCOCK 4 WAY 40 W/Y SET (IV SOLUTION) IMPLANT
NDL HYPO 25X1 1.5 SAFETY (NEEDLE) ×1 IMPLANT
NEEDLE HYPO 25X1 1.5 SAFETY (NEEDLE) ×2 IMPLANT
PACK ABDOMINAL GYN (CUSTOM PROCEDURE TRAY) ×2 IMPLANT
PAD OB MATERNITY 4.3X12.25 (PERSONAL CARE ITEMS) ×2 IMPLANT
PAIN PUMP ON-Q 270MLX4ML 5IN (PAIN MANAGEMENT) ×1 IMPLANT
RETRACTOR WND ALEXIS 25 LRG (MISCELLANEOUS) IMPLANT
RINGERS IRRIG 1000ML POUR BTL (IV SOLUTION) ×2 IMPLANT
RTRCTR WOUND ALEXIS 25CM LRG (MISCELLANEOUS) ×2
SPONGE GAUZE 4X4 12PLY (GAUZE/BANDAGES/DRESSINGS) ×1 IMPLANT
STRIP CLOSURE SKIN 1/4X3 (GAUZE/BANDAGES/DRESSINGS) ×2 IMPLANT
SUT MNCRL AB 4-0 PS2 18 (SUTURE) ×3 IMPLANT
SUT MNCRL+ AB 3-0 CT1 36 (SUTURE) IMPLANT
SUT MON AB 2-0 CT1 36 (SUTURE) ×6 IMPLANT
SUT MON AB 3-0 SH 27 (SUTURE)
SUT MON AB 3-0 SH27 (SUTURE) IMPLANT
SUT MON AB 4-0 PS1 27 (SUTURE) ×6 IMPLANT
SUT MONOCRYL AB 3-0 CT1 36IN (SUTURE)
SUT PDS AB 0 CT1 27 (SUTURE) ×4 IMPLANT
SUT PDS AB 0 CTX 36 PDP370T (SUTURE) ×4 IMPLANT
SUT VIC AB 2-0 CT1 27 (SUTURE) ×4
SUT VIC AB 2-0 CT1 TAPERPNT 27 (SUTURE) ×2 IMPLANT
SYR 3ML 22GX1 1/2 SAFETY (SYRINGE) IMPLANT
SYR 50ML LL SCALE MARK (SYRINGE) IMPLANT
SYR 5ML LL (SYRINGE) ×2 IMPLANT
SYR CONTROL 10ML LL (SYRINGE) ×2 IMPLANT
TOWEL OR 17X24 6PK STRL BLUE (TOWEL DISPOSABLE) ×4 IMPLANT
TRAY FOLEY CATH 14FR (SET/KITS/TRAYS/PACK) ×2 IMPLANT
WATER STERILE IRR 1000ML POUR (IV SOLUTION) ×2 IMPLANT

## 2011-05-16 NOTE — Op Note (Signed)
Preoperative diagnosis: Symptomatic leiomyoma with anemia  Postoperative diagnosis: Same  Procedure: Abdominal myomectomy  Surgeon: Marcelle Overlie  Assistant:iTomblin  EBL: 150 cc  Specimens removed: Multiple leiomyoma to pathology  Complications:none  Procedure and findings:  Patient taken to the operating room after an adequate level of general check anesthesia was obtained with the patient in supine position, the abdomen was prepped and draped in usual manner for sterile abdominal procedures. The vagina was prepped separately and Foley catheter position. Transverse Pfannenstiel incision was made tibial scar carried down to the subcutaneous tissue, fascia, rectus muscle divided in the midline peritoneum entered superiorly without incident and extended in a vertical fashion. She did have some omental adhesions into the left part of the incisions part which were lysed in an avascular plane but these extended well above the operative site and the remainder of the omentum was not freed higher up. Elecsys retractor was then used bowel packed superiorly out of the field findings as follows  Exteriorly there were 3-4 serosal fibroids the largest was 4-5 cm several that were in the 2 cm range. Using dilute Pitressin solution these areas were injected at the base and excised the procedures with 2-0 Vicryl and 4-0 Monocryl on the serosal layers with excellent hemostasis. The 2 submucosal fibroids were then improved perched anteriorly by palpation incision was made in the endometrial cavity was actually entered at that point and to submucous fibroids one 2 cm the other 2-1/2-3 cm were removed the endometrium was then closed in a separate layer with 2-0 Vicryl suture, 2-0 Vicryl interrupted sutures on the myometrium and a 4-0 Monocryl interrupted sutures on the serosal with good hemostasis.   Of note the right tube and ovary were completely normal also note the left ovary was densely adherent to the left pelvic  sidewall and the fimbriated end of that left tube was absent. She has had prior ovarian cystectomy presumably on the left judging from the scarring that was noted. The appendix was normal no other abnormalities were noted pelvis was then irrigated no further fibroids remained the suture lines were noted to be hemostatic Interceed was placed across the suture lines. Prior to closure sponge denies precast reported as correct x2 the peritoneum closed the running 2-0 Vicryl suture. 2-0 Vicryl interrupted sutures used to approximate the rectus muscles in the midline a 0 PDS was then used to close the fascia from laterally to midline on either side  Prior to closure the fascia six-inch needle introducer was then used to positioning On-Q catheter the subfascial space a septal wall was then used to introduce a subcutaneous On-Q catheter. The remainder the fascia was closed subcutaneous tissue was irrigated and noted be hemostatic 3-0 Vicryl running stitch to reapproximate the subcutaneous dead space was hemostatic oral Monocryl subcuticular suture was placed along with sterile pressure dressing. The On-Q catheters were then loaded with 4-5 cc 1% Xylocaine plain at each site clear urine noted in the case she went to recovery room in good condition.   Dictated with [employer], not proofread Arben Packman M. Milana Obey.D.

## 2011-05-16 NOTE — Progress Notes (Signed)
Alert + conversant , Inc C/D, adeq UOP>>clear.

## 2011-05-16 NOTE — Transfer of Care (Signed)
Immediate Anesthesia Transfer of Care Note  Patient: [name]  Procedure(s) Performed:  MYOMECTOMY - abdominal  Patient Location: PACU  Anesthesia Type: General  Level of Consciousness: awake and alert   Airway & Oxygen Therapy: Patient Spontanous Breathing and Patient connected to nasal cannula oxygen  Post-op Assessment: Report given to PACU RN and Post -op Vital signs reviewed and stable  Post vital signs: Reviewed and stable  Complications: No apparent anesthesia complications

## 2011-05-16 NOTE — Anesthesia Preprocedure Evaluation (Addendum)
Anesthesia Evaluation  Name, MR# and DOB Patient awake  General Assessment Comment  Reviewed: Allergy & Precautions, H&P , Patient's Chart, lab work & pertinent test results  History of Anesthesia Complications (+) PONV  Airway Mallampati: III TM Distance: >3 FB Neck ROM: full    Dental No notable dental hx. (+) Teeth Intact   Pulmonary  clear to auscultation  pulmonary exam normalPulmonary Exam Normal breath sounds clear to auscultation none    Cardiovascular regular Normal    Neuro/Psych Negative Neurological ROS  Negative Psych ROS  GI/Hepatic/Renal negative GI ROS  negative Liver ROS  negative Renal ROS        Endo/Other  Negative Endocrine ROS (+)      Abdominal   Musculoskeletal negative musculoskeletal ROS (+)   Hematology negative hematology ROS (+)   Peds  Reproductive/Obstetrics negative OB ROS    Anesthesia Other Findings            Anesthesia Physical Anesthesia Plan  ASA: II  Anesthesia Plan: General   Post-op Pain Management:    Induction:   Airway Management Planned:   Additional Equipment:   Intra-op Plan:   Post-operative Plan:   Informed Consent: I have reviewed the patients History and Physical, chart, labs and discussed the procedure including the risks, benefits and alternatives for the proposed anesthesia with the patient or authorized representative who has indicated his/her understanding and acceptance.     Plan Discussed with: Anesthesiologist  Anesthesia Plan Comments:         Anesthesia Quick Evaluation

## 2011-05-16 NOTE — Anesthesia Postprocedure Evaluation (Signed)
Anesthesia Post Note  Patient: [name]  Procedure(s) Performed:  MYOMECTOMY - abdominal  Anesthesia type: GA  Patient location: PACU  Post pain: Pain level controlled  Post assessment: Post-op Vital signs reviewed  Last Vitals:  Filed Vitals:   05/16/11 1000  BP: 131/70  Pulse: 95  Temp: 98 F (36.7 C)  Resp: 16    Post vital signs: Reviewed  Level of consciousness: sedated  Complications: No apparent anesthesia complications

## 2011-05-16 NOTE — Anesthesia Procedure Notes (Signed)
Procedure Name: Intubation Performed by: Carlyle Lipa Pre-anesthesia Checklist: Patient identified, Emergency Drugs available, Suction available, Patient being monitored and Timeout performed Patient Re-evaluated:Patient Re-evaluated prior to inductionOxygen Delivery Method: Circle System Utilized Preoxygenation: Pre-oxygenation with 100% oxygen Intubation Type: IV induction Ventilation: Mask ventilation without difficulty Laryngoscope Size: Miller Tube size: 7.0 mm Number of attempts: 1 Airway Equipment and Method: stylet Placement Confirmation: positive ETCO2 and breath sounds checked- equal and bilateral Secured at: 21 cm Tube secured with: Tape Dental Injury: Teeth and Oropharynx as per pre-operative assessment

## 2011-05-17 LAB — CBC
HCT: 32.2 % — ABNORMAL LOW (ref 36.0–46.0)
Hemoglobin: 10.5 g/dL — ABNORMAL LOW (ref 12.0–15.0)
MCH: 29.4 pg (ref 26.0–34.0)
MCHC: 32.6 g/dL (ref 30.0–36.0)
MCV: 90.2 fL (ref 78.0–100.0)
Platelets: 295 10*3/uL (ref 150–400)
RBC: 3.57 MIL/uL — ABNORMAL LOW (ref 3.87–5.11)
RDW: 12.8 % (ref 11.5–15.5)
WBC: 13.2 10*3/uL — ABNORMAL HIGH (ref 4.0–10.5)

## 2011-05-17 NOTE — Progress Notes (Signed)
1 Day Post-Op Procedure(s): MYOMECTOMY  Subjective: Patient reports tolerating PO.    Objective: I have reviewed patient's vital signs, intake and output, medications and labs.  iNC c/d, ABD SOFT + bs  hGB 10.5       Assessment: s/p Procedure(s): MYOMECTOMY: stable  Plan: Advance diet Encourage ambulation  LOS: 1 day    [name] M 05/17/2011, 9:11 AM     hGB 10.5

## 2011-05-17 NOTE — Progress Notes (Signed)
UR Chart review completed.  

## 2011-05-18 MED ORDER — OXYCODONE-ACETAMINOPHEN 5-325 MG PO TABS: 1.0000 | ORAL_TABLET | ORAL | Status: AC | PRN

## 2011-05-18 MED ORDER — IBUPROFEN 800 MG PO TABS: 800.0000 mg | ORAL_TABLET | Freq: Three times a day (TID) | ORAL | Status: AC | PRN

## 2011-05-18 NOTE — Progress Notes (Signed)
2 Days Post-Op Procedure(s): MYOMECTOMY  Subjective: Patient reports tolerating PO.    Objective: I have reviewed patient's vital signs, medications and labs.  Inc CD, ON Q cath removed intact X 2  Assessment: s/p Procedure(s): MYOMECTOMY: stable  Plan: Discharge home  LOS: 2 days    Kaleah Hagemeister M 05/18/2011, 8:49 AM

## 2011-05-18 NOTE — Discharge Summary (Signed)
Physician Discharge Summary  Patient ID: [name] MRN: [id number] DOB/AGE: April 11, 1971 39 y.o.  Admit date: 05/16/2011 Discharge date: 05/18/2011  Admission Diagnoses:  Discharge Diagnoses:  Active Problems:  * No active hospital problems. *    Discharged Condition: good  Hospital Course: EL with mult myomectomy.  POD 1, diet aedvanced, afeb.  On POD 2, afeb., inc CD, tol PO and ready for DC  Consults: none  Significant Diagnostic Studies: labs:    Treatments: surgery: myomectomy  Discharge Exam: Blood pressure 139/86, pulse 73, temperature 98.5 F (36.9 C), temperature source Oral, resp. rate 18, height 5' 5.51" (1.664 m), weight 100.245 kg (221 lb), SpO2 95.00%.   Disposition: Final discharge disposition not confirmed   Current Discharge Medication List    START taking these medications   Details  ibuprofen (ADVIL,MOTRIN) 800 MG tablet Take 1 tablet (800 mg total) by mouth every 8 (eight) hours as needed for pain (mild pain). Qty: 30 tablet, Refills: 2    oxyCODONE-acetaminophen (PERCOCET) 5-325 MG per tablet Take 1-2 tablets by mouth every 3 (three) hours as needed (moderate to severe pain (when tolerating fluids)). Qty: 30 tablet, Refills: 0      CONTINUE these medications which have NOT CHANGED   Details  ALPRAZolam (XANAX) 0.25 MG tablet Take 0.25 mg by mouth. Take one tab every other day as needed for anxiety     amLODipine-benazepril (LOTREL) 5-10 MG per capsule TAKE ONE CAPSULE BY MOUTH EVERY DAY Qty: 30 capsule, Refills: 4    atorvastatin (LIPITOR) 80 MG tablet Take 40 mg by mouth daily. Take a half-tablet daily    norgestimate-ethinyl estradiol (SPRINTEC 28) 0.25-35 MG-MCG tablet Take 1 tablet by mouth daily.        STOP taking these medications     sertraline (ZOLOFT) 100 MG tablet      traZODone (DESYREL) 50 MG tablet      desogestrel-ethinyl estradiol (APRI) 0.15-30 MG-MCG per tablet        Follow-up Information    Follow up  with Meriel Pica. Call in 6 days.   Contact information:   [address] [place] [address] [phone]          Signed: Meriel Pica 05/18/2011, 8:53 AM

## 2011-05-18 NOTE — Progress Notes (Signed)
D/c instructions discussed with pt., pt states understanding of same, no home equipment needed, wheelchair to car with staff without incident, d/c'd home with family.

## 2011-05-20 ENCOUNTER — Encounter (HOSPITAL_COMMUNITY): Payer: Self-pay | Admitting: Obstetrics and Gynecology

## 2011-06-03 ENCOUNTER — Other Ambulatory Visit: Payer: Self-pay | Admitting: *Deleted

## 2011-06-03 MED ORDER — SERTRALINE HCL 100 MG PO TABS: 100.0000 mg | ORAL_TABLET | Freq: Every day | ORAL | Status: DC

## 2011-06-28 ENCOUNTER — Other Ambulatory Visit: Payer: Self-pay | Admitting: Internal Medicine

## 2011-07-18 ENCOUNTER — Other Ambulatory Visit: Payer: BC Managed Care – PPO

## 2011-07-24 ENCOUNTER — Other Ambulatory Visit: Payer: Self-pay | Admitting: *Deleted

## 2011-07-26 ENCOUNTER — Encounter: Payer: BC Managed Care – PPO | Admitting: Internal Medicine

## 2011-08-12 ENCOUNTER — Other Ambulatory Visit: Payer: Self-pay | Admitting: *Deleted

## 2011-09-09 ENCOUNTER — Other Ambulatory Visit: Payer: BC Managed Care – PPO

## 2011-09-10 ENCOUNTER — Other Ambulatory Visit (INDEPENDENT_AMBULATORY_CARE_PROVIDER_SITE_OTHER): Payer: BC Managed Care – PPO

## 2011-09-10 DIAGNOSIS — Z0000 Encounter for general adult medical examination without abnormal findings: Secondary | ICD-10-CM

## 2011-09-10 LAB — CBC WITH DIFFERENTIAL/PLATELET
Basophils Absolute: 0 10*3/uL (ref 0.0–0.1)
Basophils Relative: 0.3 % (ref 0.0–3.0)
Eosinophils Absolute: 0.1 10*3/uL (ref 0.0–0.7)
Eosinophils Relative: 1.5 % (ref 0.0–5.0)
HCT: 41.3 % (ref 36.0–46.0)
Hemoglobin: 14 g/dL (ref 12.0–15.0)
Lymphocytes Relative: 21.3 % (ref 12.0–46.0)
Lymphs Abs: 1.8 10*3/uL (ref 0.7–4.0)
MCHC: 33.9 g/dL (ref 30.0–36.0)
MCV: 90.7 fl (ref 78.0–100.0)
Monocytes Absolute: 0.5 10*3/uL (ref 0.1–1.0)
Monocytes Relative: 5.4 % (ref 3.0–12.0)
Neutro Abs: 6 10*3/uL (ref 1.4–7.7)
Neutrophils Relative %: 71.5 % (ref 43.0–77.0)
Platelets: 348 10*3/uL (ref 150.0–400.0)
RBC: 4.55 Mil/uL (ref 3.87–5.11)
RDW: 12.7 % (ref 11.5–14.6)
WBC: 8.4 10*3/uL (ref 4.5–10.5)

## 2011-09-10 LAB — HEPATIC FUNCTION PANEL
ALT: 24 U/L (ref 0–35)
AST: 27 U/L (ref 0–37)
Albumin: 4 g/dL (ref 3.5–5.2)
Alkaline Phosphatase: 118 U/L — ABNORMAL HIGH (ref 39–117)
Bilirubin, Direct: 0 mg/dL (ref 0.0–0.3)
Total Bilirubin: 0.6 mg/dL (ref 0.3–1.2)
Total Protein: 7.7 g/dL (ref 6.0–8.3)

## 2011-09-10 LAB — BASIC METABOLIC PANEL
BUN: 14 mg/dL (ref 6–23)
CO2: 29 mEq/L (ref 19–32)
Calcium: 9.2 mg/dL (ref 8.4–10.5)
Chloride: 101 mEq/L (ref 96–112)
Creatinine, Ser: 0.9 mg/dL (ref 0.4–1.2)
GFR: 72.58 mL/min (ref >60.00)
Glucose, Bld: 100 mg/dL — ABNORMAL HIGH (ref 70–99)
Potassium: 3.8 mEq/L (ref 3.5–5.1)
Sodium: 138 mEq/L (ref 135–145)

## 2011-09-10 LAB — POCT URINALYSIS DIPSTICK
Bilirubin, UA: NEGATIVE
Glucose, UA: NEGATIVE
Ketones, UA: NEGATIVE
Leukocytes, UA: NEGATIVE
Nitrite, UA: NEGATIVE
Protein, UA: NEGATIVE
Spec Grav, UA: <=1.005
Urobilinogen, UA: 0.2
pH, UA: 6

## 2011-09-10 LAB — TSH: TSH: 1.98 u[IU]/mL (ref 0.35–5.50)

## 2011-09-10 LAB — LDL CHOLESTEROL, DIRECT: Direct LDL: 123.7 mg/dL

## 2011-09-10 LAB — LIPID PANEL
Cholesterol: 227 mg/dL — ABNORMAL HIGH (ref 0–200)
HDL: 72.4 mg/dL (ref >39.00)
Total CHOL/HDL Ratio: 3
Triglycerides: 143 mg/dL (ref 0.0–149.0)
VLDL: 28.6 mg/dL (ref 0.0–40.0)

## 2011-09-16 ENCOUNTER — Encounter: Payer: Self-pay | Admitting: Internal Medicine

## 2011-09-16 ENCOUNTER — Ambulatory Visit (INDEPENDENT_AMBULATORY_CARE_PROVIDER_SITE_OTHER): Payer: BC Managed Care – PPO | Admitting: Internal Medicine

## 2011-09-16 VITALS — BP 126/92 | HR 80 | Temp 98.4°F | Ht 65.5 in | Wt 240.0 lb

## 2011-09-16 DIAGNOSIS — Z0000 Encounter for general adult medical examination without abnormal findings: Secondary | ICD-10-CM

## 2011-09-16 MED ORDER — SERTRALINE HCL 100 MG PO TABS: 100.0000 mg | ORAL_TABLET | Freq: Every day | ORAL | Status: DC

## 2011-09-16 MED ORDER — AMLODIPINE BESY-BENAZEPRIL HCL 5-10 MG PO CAPS: 1.0000 | ORAL_CAPSULE | Freq: Every day | ORAL | Status: DC

## 2011-09-16 MED ORDER — ATORVASTATIN CALCIUM 80 MG PO TABS: 40.0000 mg | ORAL_TABLET | Freq: Every day | ORAL | Status: DC

## 2011-09-16 NOTE — Progress Notes (Signed)
cpx  Past Medical History  Diagnosis Date  . PONV (postoperative nausea and vomiting)   . Hypertension   . Anxiety   . Depression     recent death of mother    History   Social History  . Marital Status: Single    Spouse Name: N/A    Number of Children: N/A  . Years of Education: N/A   Occupational History  . Not on file.   Social History Main Topics  . Smoking status: Never Smoker   . Smokeless tobacco: Never Used  . Alcohol Use: Yes  . Drug Use: No  . Sexually Active: Not on file   Other Topics Concern  . Not on file   Social History Narrative  . No narrative on file    Past Surgical History  Procedure Date  . Myomectomy 2009  . Laparotomy   . Myomectomy 05/16/2011    Procedure: MYOMECTOMY;  Surgeon: Meriel Pica;  Location: [facility];  Service: Gynecology;  Laterality: N/A;  abdominal    Family History  Problem Relation Age of Onset  . Heart disease Mother 1    cad  . Lupus Mother   . COPD Mother   . Diabetes Father     No Known Allergies  Current Outpatient Prescriptions on File Prior to Visit  Medication Sig Dispense Refill  . amLODipine-benazepril (LOTREL) 5-10 MG per capsule TAKE ONE CAPSULE BY MOUTH EVERY DAY  30 capsule  0  . atorvastatin (LIPITOR) 80 MG tablet Take 40 mg by mouth daily. Take a half-tablet daily      . norgestimate-ethinyl estradiol (SPRINTEC 28) 0.25-35 MG-MCG tablet Take 1 tablet by mouth daily.        . sertraline (ZOLOFT) 100 MG tablet Take 1 tablet (100 mg total) by mouth daily.  30 tablet  0     patient denies chest pain, shortness of breath, orthopnea. Denies lower extremity edema, abdominal pain, change in appetite, change in bowel movements. Patient denies rashes, musculoskeletal complaints. No other specific complaints in a complete review of systems.   BP 126/92  Pulse 80  Temp(Src) 98.4 F (36.9 C) (Oral)  Ht 5' 5.5" (1.664 m)  Wt 240 lb (108.863 kg)  BMI 39.33 kg/m2  Well-developed well-nourished female  in no acute distress. HEENT exam atraumatic, normocephalic, extraocular muscles are intact. Neck is supple. No jugular venous distention no thyromegaly. Chest clear to auscultation without increased work of breathing. Cardiac exam S1 and S2 are regular. Abdominal exam active bowel sounds, soft, nontender. Extremities no edema. Neurologic exam she is alert without any motor sensory deficits. Gait is normal.  A/P: Well visit: health Maintenance UTD

## 2012-03-22 ENCOUNTER — Other Ambulatory Visit: Payer: Self-pay | Admitting: Internal Medicine

## 2012-03-26 ENCOUNTER — Emergency Department (HOSPITAL_COMMUNITY)
Admission: EM | Admit: 2012-03-26 | Discharge: 2012-03-26 | Disposition: A | Discharge status: Home / Self Care | Payer: BC Managed Care – PPO | Attending: Emergency Medicine | Admitting: Emergency Medicine

## 2012-03-26 ENCOUNTER — Emergency Department (HOSPITAL_COMMUNITY): Payer: BC Managed Care – PPO

## 2012-03-26 ENCOUNTER — Encounter (HOSPITAL_COMMUNITY): Payer: Self-pay | Admitting: *Deleted

## 2012-03-26 DIAGNOSIS — R079 Chest pain, unspecified: Secondary | ICD-10-CM | POA: Insufficient documentation

## 2012-03-26 DIAGNOSIS — F419 Anxiety disorder, unspecified: Secondary | ICD-10-CM

## 2012-03-26 DIAGNOSIS — I10 Essential (primary) hypertension: Secondary | ICD-10-CM | POA: Insufficient documentation

## 2012-03-26 DIAGNOSIS — Z833 Family history of diabetes mellitus: Secondary | ICD-10-CM | POA: Insufficient documentation

## 2012-03-26 DIAGNOSIS — Z8249 Family history of ischemic heart disease and other diseases of the circulatory system: Secondary | ICD-10-CM | POA: Insufficient documentation

## 2012-03-26 DIAGNOSIS — Z836 Family history of other diseases of the respiratory system: Secondary | ICD-10-CM | POA: Insufficient documentation

## 2012-03-26 DIAGNOSIS — F411 Generalized anxiety disorder: Secondary | ICD-10-CM | POA: Insufficient documentation

## 2012-03-26 LAB — URINALYSIS, ROUTINE W REFLEX MICROSCOPIC
Bilirubin Urine: NEGATIVE
Glucose, UA: NEGATIVE mg/dL
Ketones, ur: NEGATIVE mg/dL
Leukocytes, UA: NEGATIVE
Nitrite: NEGATIVE
Protein, ur: NEGATIVE mg/dL
Specific Gravity, Urine: 1.007 (ref 1.005–1.030)
Urobilinogen, UA: 0.2 mg/dL (ref 0.0–1.0)
pH: 7 (ref 5.0–8.0)

## 2012-03-26 LAB — CBC WITH DIFFERENTIAL/PLATELET
Basophils Absolute: 0 K/uL (ref 0.0–0.1)
Basophils Relative: 0 % (ref 0–1)
Eosinophils Absolute: 0.1 K/uL (ref 0.0–0.7)
Eosinophils Relative: 1 % (ref 0–5)
HCT: 41.1 % (ref 36.0–46.0)
Hemoglobin: 14.2 g/dL (ref 12.0–15.0)
Lymphocytes Relative: 23 % (ref 12–46)
Lymphs Abs: 2.3 K/uL (ref 0.7–4.0)
MCH: 30.7 pg (ref 26.0–34.0)
MCHC: 34.5 g/dL (ref 30.0–36.0)
MCV: 89 fL (ref 78.0–100.0)
Monocytes Absolute: 0.4 K/uL (ref 0.1–1.0)
Monocytes Relative: 4 % (ref 3–12)
Neutro Abs: 7.2 K/uL (ref 1.7–7.7)
Neutrophils Relative %: 71 % (ref 43–77)
Platelets: 343 K/uL (ref 150–400)
RBC: 4.62 MIL/uL (ref 3.87–5.11)
RDW: 12 % (ref 11.5–15.5)
WBC: 10.1 K/uL (ref 4.0–10.5)

## 2012-03-26 LAB — COMPREHENSIVE METABOLIC PANEL
ALT: 38 U/L — ABNORMAL HIGH (ref 0–35)
AST: 33 U/L (ref 0–37)
Albumin: 3.9 g/dL (ref 3.5–5.2)
Alkaline Phosphatase: 115 U/L (ref 39–117)
BUN: 10 mg/dL (ref 6–23)
CO2: 27 mEq/L (ref 19–32)
Calcium: 9.5 mg/dL (ref 8.4–10.5)
Chloride: 100 mEq/L (ref 96–112)
Creatinine, Ser: 0.82 mg/dL (ref 0.50–1.10)
GFR calc Af Amer: >90 mL/min (ref >90)
GFR calc non Af Amer: 90 mL/min — ABNORMAL LOW (ref >90)
Glucose, Bld: 101 mg/dL — ABNORMAL HIGH (ref 70–99)
Potassium: 3.5 mEq/L (ref 3.5–5.1)
Sodium: 137 mEq/L (ref 135–145)
Total Bilirubin: 0.3 mg/dL (ref 0.3–1.2)
Total Protein: 7.9 g/dL (ref 6.0–8.3)

## 2012-03-26 LAB — URINE MICROSCOPIC-ADD ON

## 2012-03-26 LAB — PREGNANCY, URINE: Preg Test, Ur: NEGATIVE

## 2012-03-26 LAB — D-DIMER, QUANTITATIVE: D-Dimer, Quant: 0.33 ug{FEU}/mL (ref 0.00–0.48)

## 2012-03-26 LAB — TROPONIN I: Troponin I: <0.3 ng/mL (ref <0.30)

## 2012-03-26 MED ORDER — LORAZEPAM 0.5 MG PO TABS: 0.5000 mg | ORAL_TABLET | Freq: Four times a day (QID) | ORAL | Status: DC | PRN

## 2012-03-26 MED ORDER — LORAZEPAM 1 MG PO TABS
1.0000 mg | ORAL_TABLET | Freq: Once | ORAL | Status: AC
  Administered 2012-03-26: 1 mg via ORAL
  Filled 2012-03-26: qty 1

## 2012-03-26 NOTE — ED Notes (Signed)
Patient transported to X-ray 

## 2012-03-26 NOTE — ED Notes (Signed)
40/f brought to er by husband for c/o intermittent chest pain radiating to left arm that began earlier today while pt was at work. Pt ambulatory to ER bed A4, balanced and steady gait and NAD. Pt has minimal chest pain at this time, rates pain 2 out of 10. Pt's husband at bedside. Pt on cardiac monitor and spo2 monitor. Awaiting new orders. Will continue to monitor pt.

## 2012-03-26 NOTE — ED Provider Notes (Signed)
History     CSN: [id number]  Arrival date & time 03/26/12  1550   First MD Initiated Contact with Patient 03/26/12 2011      Chief Complaint  Patient presents with  . Chest Pain    (Consider location/radiation/quality/duration/timing/severity/associated sxs/prior treatment) Patient is a 41 y.o. female presenting with chest pain. The history is provided by the patient.  Chest Pain The chest pain began more than 2 weeks ago. Chest pain occurs intermittently. The chest pain is unchanged. The pain is associated with stress. The quality of the pain is described as pressure-like. The pain radiates to the left jaw, left shoulder and left arm. Chest pain is worsened by stress. Primary symptoms include shortness of breath and palpitations. Pertinent negatives for primary symptoms include no fever, no cough, no wheezing, no abdominal pain, no nausea, no vomiting and no dizziness.  The palpitations also occurred with shortness of breath. The palpitations did not occur with dizziness.   Pertinent negatives for associated symptoms include no claudication, no diaphoresis, no near-syncope, no numbness, no orthopnea and no weakness.   Pt states pain intermittent with SOB for several weeks. PT admits she is under a lot of stress. Tingling to left hand associated with pain. Pt states she feels very anxious, and thinks that that is why she has pain but not sure because she has never had similar symptoms before. Pt did traven back and forth from [place] several times in last few months. Denies swelling or pain in her legs. She is on birth control. Not a smoker.   Past Medical History  Diagnosis Date  . PONV (postoperative nausea and vomiting)   . Hypertension   . Anxiety   . Depression     recent death of mother    Past Surgical History  Procedure Date  . Myomectomy 2009  . Laparotomy   . Myomectomy 05/16/2011    Procedure: MYOMECTOMY;  Surgeon: Meriel Pica;  Location: [facility];  Service:  Gynecology;  Laterality: N/A;  abdominal    Family History  Problem Relation Age of Onset  . Heart disease Mother 4    cad  . Lupus Mother   . COPD Mother   . Diabetes Father     History  Substance Use Topics  . Smoking status: Never Smoker   . Smokeless tobacco: Never Used  . Alcohol Use: Yes    OB History    Grav Para Term Preterm Abortions TAB SAB Ect Mult Living                  Review of Systems  Constitutional: Negative for fever and diaphoresis.  HENT: Negative for neck pain and neck stiffness.   Respiratory: Positive for chest tightness and shortness of breath. Negative for cough and wheezing.   Cardiovascular: Positive for chest pain and palpitations. Negative for orthopnea, claudication, leg swelling and near-syncope.  Gastrointestinal: Negative for nausea, vomiting and abdominal pain.  Genitourinary: Negative.   Musculoskeletal: Negative.   Skin: Negative.   Neurological: Negative for dizziness, weakness, numbness and headaches.    Allergies  Review of patient's allergies indicates no known allergies.  Home Medications   Current Outpatient Rx  Name Route Sig Dispense Refill  . AMLODIPINE BESY-BENAZEPRIL HCL 5-10 MG PO CAPS Oral Take 1 capsule by mouth daily.    . SERTRALINE HCL 100 MG PO TABS Oral Take 100 mg by mouth daily.    . ATORVASTATIN CALCIUM 80 MG PO TABS Oral Take 0.5 tablets (40  mg total) by mouth daily. 30 tablet 5  . NORGESTIMATE-ETH ESTRADIOL 0.25-35 MG-MCG PO TABS Oral Take 1 tablet by mouth daily.      . TRAZODONE HCL 100 MG PO TABS Oral Take 1 tablet by mouth daily.      BP 179/108  Pulse 63  Temp 98.2 F (36.8 C) (Oral)  Resp 18  SpO2 97%  LMP 03/26/2012  Physical Exam  Nursing note and vitals reviewed. Constitutional: She is oriented to person, place, and time. She appears well-developed and well-nourished. No distress.  HENT:  Head: Normocephalic.  Eyes: Conjunctivae are normal.  Neck: Neck supple.  Cardiovascular:  Normal rate, regular rhythm and normal heart sounds.   Pulmonary/Chest: Effort normal and breath sounds normal. No respiratory distress. She has no wheezes. She has no rales.  Abdominal: Soft. Bowel sounds are normal. She exhibits no distension. There is no tenderness. There is no rebound.  Musculoskeletal: Normal range of motion. She exhibits no edema and no tenderness.  Neurological: She is alert and oriented to person, place, and time.  Skin: Skin is warm and dry.  Psychiatric:       Appears anxious    ED Course  Procedures (including critical care time)  Pt is low risk for ACS, TIMI 0, pt is however at risk for a PE, given recent travel and OCPs. Pt's BS show normal oxygen sat, she is not tachypnec or tachycardic. BP is elevated. Will get labs, d dimer, CXR. She appears very anxious, will give ativan.    Date: 03/26/2012  Rate: 80  Rhythm: normal sinus rhythm  QRS Axis: normal  Intervals: normal  ST/T Wave abnormalities: normal  Conduction Disutrbances:Incomplete RBBB  Narrative Interpretation:   Old EKG Reviewed: none available   Results for orders placed during the hospital encounter of 03/26/12  URINALYSIS, ROUTINE W REFLEX MICROSCOPIC      Component Value Range   Color, Urine YELLOW  YELLOW   APPearance CLEAR  CLEAR   Specific Gravity, Urine 1.007  1.005 - 1.030   pH 7.0  5.0 - 8.0   Glucose, UA NEGATIVE  NEGATIVE mg/dL   Hgb urine dipstick MODERATE (*) NEGATIVE   Bilirubin Urine NEGATIVE  NEGATIVE   Ketones, ur NEGATIVE  NEGATIVE mg/dL   Protein, ur NEGATIVE  NEGATIVE mg/dL   Urobilinogen, UA 0.2  0.0 - 1.0 mg/dL   Nitrite NEGATIVE  NEGATIVE   Leukocytes, UA NEGATIVE  NEGATIVE  PREGNANCY, URINE      Component Value Range   Preg Test, Ur NEGATIVE  NEGATIVE  CBC WITH DIFFERENTIAL      Component Value Range   WBC 10.1  4.0 - 10.5 K/uL   RBC 4.62  3.87 - 5.11 MIL/uL   Hemoglobin 14.2  12.0 - 15.0 g/dL   HCT 16.1  09.6 - 04.5 %   MCV 89.0  78.0 - 100.0 fL   MCH  30.7  26.0 - 34.0 pg   MCHC 34.5  30.0 - 36.0 g/dL   RDW 40.9  81.1 - 91.4 %   Platelets 343  150 - 400 K/uL   Neutrophils Relative 71  43 - 77 %   Neutro Abs 7.2  1.7 - 7.7 K/uL   Lymphocytes Relative 23  12 - 46 %   Lymphs Abs 2.3  0.7 - 4.0 K/uL   Monocytes Relative 4  3 - 12 %   Monocytes Absolute 0.4  0.1 - 1.0 K/uL   Eosinophils Relative 1  0 - 5 %  Eosinophils Absolute 0.1  0.0 - 0.7 K/uL   Basophils Relative 0  0 - 1 %   Basophils Absolute 0.0  0.0 - 0.1 K/uL  COMPREHENSIVE METABOLIC PANEL      Component Value Range   Sodium 137  135 - 145 mEq/L   Potassium 3.5  3.5 - 5.1 mEq/L   Chloride 100  96 - 112 mEq/L   CO2 27  19 - 32 mEq/L   Glucose, Bld 101 (*) 70 - 99 mg/dL   BUN 10  6 - 23 mg/dL   Creatinine, Ser 4.09  0.50 - 1.10 mg/dL   Calcium 9.5  8.4 - 81.1 mg/dL   Total Protein 7.9  6.0 - 8.3 g/dL   Albumin 3.9  3.5 - 5.2 g/dL   AST 33  0 - 37 U/L   ALT 38 (*) 0 - 35 U/L   Alkaline Phosphatase 115  39 - 117 U/L   Total Bilirubin 0.3  0.3 - 1.2 mg/dL   GFR calc non Af Amer 90 (*) >90 mL/min   GFR calc Af Amer >90  >90 mL/min  TROPONIN I      Component Value Range   Troponin I <0.30  <0.30 ng/mL  URINE MICROSCOPIC-ADD ON      Component Value Range   Squamous Epithelial / LPF RARE  RARE   RBC / HPF 3-6  <3 RBC/hpf  D-DIMER, QUANTITATIVE      Component Value Range   D-Dimer, Quant 0.33  0.00 - 0.48 ug/mL-FEU   Dg Chest 2 View  03/26/2012  *RADIOLOGY REPORT*  Clinical Data: Left-sided arm and neck numbness.  Chest tightness.  CHEST - 2 VIEW  Comparison: None.  Findings: The heart size is normal.  The lungs are clear.  The visualized soft tissues and bony thorax are unremarkable.  IMPRESSION: Negative chest.  Original Report Authenticated By: Jamesetta Orleans. MATTERN, M.D.   Labs unremarkable. Negative D dimer, doubt PE. TIMI 0, with unremarkable ECG and negative troponin, doubt ACS. PT is hypertensive, very anxious. Given ativan, PO, which helped her symptoms. Pt  instructed to follow up with her PCP to recheck BP and for further evaluation of her chest pain.   Filed Vitals:   03/26/12 2200  BP: 142/99  Pulse: 71  Temp:   Resp: 17    1. Chest pain   2. Anxiety       MDM  Pt with CP, SOB. CXR negative, d dimer negative, TIMI 0, low risk for acs. Pt very anxious and attributes her symptoms to stress. Will d/c home with PCP follow up.         Lottie Mussel, [place] 03/26/12 2218

## 2012-03-26 NOTE — ED Provider Notes (Signed)
Medical screening examination/treatment/procedure(s) were performed by non-physician practitioner and as supervising physician I was immediately available for consultation/collaboration.   [name]. Oletta Lamas, MD 03/26/12 2226

## 2012-03-26 NOTE — ED Notes (Signed)
The pt has had lt upper chest pain with lt arm radaition and tingling  For 3-4 hours through the day.  She has some anxiety a;so

## 2012-03-30 ENCOUNTER — Encounter: Payer: Self-pay | Admitting: Family

## 2012-03-30 ENCOUNTER — Other Ambulatory Visit: Payer: Self-pay | Admitting: Family

## 2012-03-30 ENCOUNTER — Ambulatory Visit (INDEPENDENT_AMBULATORY_CARE_PROVIDER_SITE_OTHER): Payer: BC Managed Care – PPO | Admitting: Family

## 2012-03-30 VITALS — BP 138/90 | HR 104 | Temp 98.9°F | Wt 235.0 lb

## 2012-03-30 DIAGNOSIS — F329 Major depressive disorder, single episode, unspecified: Secondary | ICD-10-CM

## 2012-03-30 DIAGNOSIS — F419 Anxiety disorder, unspecified: Secondary | ICD-10-CM

## 2012-03-30 DIAGNOSIS — F411 Generalized anxiety disorder: Secondary | ICD-10-CM

## 2012-03-30 DIAGNOSIS — F32A Depression, unspecified: Secondary | ICD-10-CM

## 2012-03-30 MED ORDER — ESCITALOPRAM OXALATE 10 MG PO TABS: 10.0000 mg | ORAL_TABLET | Freq: Every day | ORAL | Status: DC

## 2012-03-30 MED ORDER — LORAZEPAM 0.5 MG PO TABS: 0.5000 mg | ORAL_TABLET | Freq: Two times a day (BID) | ORAL | Status: AC | PRN

## 2012-03-30 NOTE — Patient Instructions (Addendum)

## 2012-03-30 NOTE — Progress Notes (Signed)
Subjective:    Patient ID: [name], female    DOB: July 08, 1971, 41 y.o.   MRN: [id number]  HPI 41 year old white female, nonsmoker, patient of Dr. Cato Mulligan is in today as a hospital followup. She was seen in the emergency department with chest pain and numbness down her left arm. She had cardiac enzymes drawn that were negative. After cardiac workup, it was determined that her chest pain was related to anxiety. Patient reports an increase in stress at work load in her job. She currently taking Zoloft 100 mg for which she's been taken for over 10 years. She has panic attacks nearly daily. Denies any feelings of helplessness, hopelessness, thoughts of death or dying.   Review of Systems  Constitutional: Negative.   Respiratory: Negative.   Cardiovascular: Negative.   Gastrointestinal: Negative.   Neurological: Negative.   Psychiatric/Behavioral: Negative for suicidal ideas, disturbed wake/sleep cycle and self-injury. The patient is nervous/anxious.    Past Medical History  Diagnosis Date  . PONV (postoperative nausea and vomiting)   . Hypertension   . Anxiety   . Depression     recent death of mother    History   Social History  . Marital Status: Single    Spouse Name: N/A    Number of Children: N/A  . Years of Education: N/A   Occupational History  . Not on file.   Social History Main Topics  . Smoking status: Never Smoker   . Smokeless tobacco: Never Used  . Alcohol Use: Yes  . Drug Use: No  . Sexually Active: Not on file   Other Topics Concern  . Not on file   Social History Narrative  . No narrative on file    Past Surgical History  Procedure Date  . Myomectomy 2009  . Laparotomy   . Myomectomy 05/16/2011    Procedure: MYOMECTOMY;  Surgeon: Meriel Pica;  Location: [facility];  Service: Gynecology;  Laterality: N/A;  abdominal    Family History  Problem Relation Age of Onset  . Heart disease Mother 77    cad  . Lupus Mother   . COPD Mother     . Diabetes Father     No Known Allergies  Current Outpatient Prescriptions on File Prior to Visit  Medication Sig Dispense Refill  . amLODipine-benazepril (LOTREL) 5-10 MG per capsule Take 1 capsule by mouth daily.      Marland Kitchen atorvastatin (LIPITOR) 80 MG tablet Take 40 mg by mouth daily.      . norgestimate-ethinyl estradiol (ORTHO-CYCLEN,SPRINTEC,PREVIFEM) 0.25-35 MG-MCG tablet Take 1 tablet by mouth daily.      . traZODone (DESYREL) 100 MG tablet Take 1 tablet by mouth at bedtime as needed. For sleep      . escitalopram (LEXAPRO) 10 MG tablet Take 1 tablet (10 mg total) by mouth daily.  30 tablet  3    BP 138/90  Pulse 104  Temp 98.9 F (37.2 C) (Oral)  Wt 235 lb (106.595 kg)  SpO2 95%  LMP 07/25/2013chart    Objective:   Physical Exam  Constitutional: She is oriented to person, place, and time. She appears well-developed and well-nourished.  Neck: Normal range of motion. Neck supple.  Cardiovascular: Normal rate and normal heart sounds.   Pulmonary/Chest: Effort normal and breath sounds normal.  Abdominal: Soft. Bowel sounds are normal.  Neurological: She is alert and oriented to person, place, and time.  Skin: Skin is warm and dry.  Psychiatric: She has a normal mood and  affect.          Assessment & Plan:  Assessment: Anxiety, depression  Plan: Continue Ativan. DC Zoloft and start Lexapro 10 mg once daily. Exercise to increase serotonin levels. Gynecology office if symptoms worsen or persist. Recheck in 2-3 weeks with her primary care provider or myself and sooner when necessary.

## 2012-04-24 ENCOUNTER — Ambulatory Visit: Payer: BC Managed Care – PPO | Admitting: Internal Medicine

## 2012-05-27 ENCOUNTER — Other Ambulatory Visit: Payer: Self-pay | Admitting: Family

## 2012-05-28 NOTE — Telephone Encounter (Signed)
Padonda prescribed this

## 2012-05-29 NOTE — Telephone Encounter (Signed)
Ok to refill 

## 2012-06-01 NOTE — Telephone Encounter (Signed)
rx called in

## 2012-06-02 ENCOUNTER — Ambulatory Visit: Payer: BC Managed Care – PPO | Admitting: Internal Medicine

## 2012-07-01 ENCOUNTER — Ambulatory Visit (INDEPENDENT_AMBULATORY_CARE_PROVIDER_SITE_OTHER): Payer: BC Managed Care – PPO | Admitting: Internal Medicine

## 2012-07-01 ENCOUNTER — Encounter: Payer: Self-pay | Admitting: Internal Medicine

## 2012-07-01 VITALS — BP 136/102 | HR 76 | Temp 98.7°F | Wt 236.0 lb

## 2012-07-01 DIAGNOSIS — I10 Essential (primary) hypertension: Secondary | ICD-10-CM

## 2012-07-01 DIAGNOSIS — F329 Major depressive disorder, single episode, unspecified: Secondary | ICD-10-CM

## 2012-07-01 DIAGNOSIS — E785 Hyperlipidemia, unspecified: Secondary | ICD-10-CM

## 2012-07-01 LAB — LIPID PANEL
Cholesterol: 206 mg/dL — ABNORMAL HIGH (ref 0–200)
HDL: 68.9 mg/dL (ref >39.00)
Total CHOL/HDL Ratio: 3
Triglycerides: 185 mg/dL — ABNORMAL HIGH (ref 0.0–149.0)
VLDL: 37 mg/dL (ref 0.0–40.0)

## 2012-07-01 LAB — HEPATIC FUNCTION PANEL
ALT: 23 U/L (ref 0–35)
AST: 21 U/L (ref 0–37)
Albumin: 3.7 g/dL (ref 3.5–5.2)
Alkaline Phosphatase: 96 U/L (ref 39–117)
Bilirubin, Direct: 0 mg/dL (ref 0.0–0.3)
Total Bilirubin: 0.4 mg/dL (ref 0.3–1.2)
Total Protein: 7.7 g/dL (ref 6.0–8.3)

## 2012-07-01 LAB — LDL CHOLESTEROL, DIRECT: Direct LDL: 114.3 mg/dL

## 2012-07-01 NOTE — Assessment & Plan Note (Signed)
Refer to psych 

## 2012-07-01 NOTE — Assessment & Plan Note (Signed)
She will monitor at home Goal bp <140/90

## 2012-07-01 NOTE — Assessment & Plan Note (Signed)
Check labs today.

## 2012-07-01 NOTE — Progress Notes (Signed)
Patient ID: [name], female   DOB: 09/21/1970, 41 y.o.   MRN: [id number]  lipids-- tolerating meds  htn-- controlled at home   Anxiety-- she admits to increase anxiety - requests referral to psych  Past Medical History  Diagnosis Date  . PONV (postoperative nausea and vomiting)   . Hypertension   . Anxiety   . Depression     recent death of mother    History   Social History  . Marital Status: Single    Spouse Name: N/A    Number of Children: N/A  . Years of Education: N/A   Occupational History  . Not on file.   Social History Main Topics  . Smoking status: Never Smoker   . Smokeless tobacco: Never Used  . Alcohol Use: Yes  . Drug Use: No  . Sexually Active: Not on file   Other Topics Concern  . Not on file   Social History Narrative  . No narrative on file    Past Surgical History  Procedure Date  . Myomectomy 2009  . Laparotomy   . Myomectomy 05/16/2011    Procedure: MYOMECTOMY;  Surgeon: Meriel Pica;  Location: [facility];  Service: Gynecology;  Laterality: N/A;  abdominal    Family History  Problem Relation Age of Onset  . Heart disease Mother 6    cad  . Lupus Mother   . COPD Mother   . Diabetes Father     No Known Allergies  Current Outpatient Prescriptions on File Prior to Visit  Medication Sig Dispense Refill  . amLODipine-benazepril (LOTREL) 5-10 MG per capsule Take 1 capsule by mouth daily.      Marland Kitchen atorvastatin (LIPITOR) 80 MG tablet Take 40 mg by mouth daily.      Marland Kitchen LORazepam (ATIVAN) 0.5 MG tablet TAKE 1 TABLET BY MOUTH TWICE A DAY AS NEEDED  60 tablet  0  . norgestimate-ethinyl estradiol (ORTHO-CYCLEN,SPRINTEC,PREVIFEM) 0.25-35 MG-MCG tablet Take 1 tablet by mouth daily.      . traZODone (DESYREL) 100 MG tablet Take 1 tablet by mouth at bedtime as needed. For sleep         patient denies chest pain, shortness of breath, orthopnea. Denies lower extremity edema, abdominal pain, change in appetite, change in bowel  movements. Patient denies rashes, musculoskeletal complaints. No other specific complaints in a complete review of systems.   BP 136/102  Pulse 76  Temp 98.7 F (37.1 C) (Oral)  Wt 236 lb (107.049 kg)  Well-developed well-nourished female in no acute distress. HEENT exam atraumatic, normocephalic, extraocular muscles are intact. Neck is supple. No jugular venous distention no thyromegaly. Chest clear to auscultation without increased work of breathing. Cardiac exam S1 and S2 are regular. Abdominal exam active bowel sounds, soft, nontender.

## 2012-07-07 ENCOUNTER — Ambulatory Visit (INDEPENDENT_AMBULATORY_CARE_PROVIDER_SITE_OTHER): Payer: BC Managed Care – PPO | Admitting: Licensed Clinical Social Worker

## 2012-07-07 DIAGNOSIS — F39 Unspecified mood [affective] disorder: Secondary | ICD-10-CM

## 2012-07-15 ENCOUNTER — Ambulatory Visit (INDEPENDENT_AMBULATORY_CARE_PROVIDER_SITE_OTHER): Payer: BC Managed Care – PPO | Admitting: Licensed Clinical Social Worker

## 2012-07-15 DIAGNOSIS — F39 Unspecified mood [affective] disorder: Secondary | ICD-10-CM

## 2012-07-15 DIAGNOSIS — F341 Dysthymic disorder: Secondary | ICD-10-CM

## 2012-07-21 ENCOUNTER — Ambulatory Visit: Payer: BC Managed Care – PPO | Admitting: Licensed Clinical Social Worker

## 2012-08-04 ENCOUNTER — Ambulatory Visit (INDEPENDENT_AMBULATORY_CARE_PROVIDER_SITE_OTHER): Payer: BC Managed Care – PPO | Admitting: Licensed Clinical Social Worker

## 2012-08-04 DIAGNOSIS — F39 Unspecified mood [affective] disorder: Secondary | ICD-10-CM

## 2012-09-02 DIAGNOSIS — I609 Nontraumatic subarachnoid hemorrhage, unspecified: Secondary | ICD-10-CM

## 2012-09-02 HISTORY — DX: Nontraumatic subarachnoid hemorrhage, unspecified: I60.9

## 2012-09-10 ENCOUNTER — Telehealth: Payer: Self-pay | Admitting: Internal Medicine

## 2012-09-10 MED ORDER — SERTRALINE HCL 100 MG PO TABS: 150.0000 mg | ORAL_TABLET | Freq: Every day | ORAL | Status: DC

## 2012-09-10 NOTE — Telephone Encounter (Signed)
Pt was here in October. She states at that time Dr. Cato Mulligan increased her Zoloft to 150mg  a day, 1 & 1/2 tab daily. He rx was for 30 pills, but she'll need 45 pills for 30 days. Requesting we send that in a new rx to [facility]. Please advise.

## 2012-09-10 NOTE — Telephone Encounter (Signed)
Documented in chart that she takes Zoloft 150mg  1.5 tablets daily.  Rx sent in electronically to pharmacy

## 2012-09-24 ENCOUNTER — Other Ambulatory Visit: Payer: Self-pay | Admitting: Internal Medicine

## 2012-10-08 ENCOUNTER — Other Ambulatory Visit: Payer: Self-pay | Admitting: Family

## 2012-10-09 ENCOUNTER — Other Ambulatory Visit: Payer: Self-pay | Admitting: *Deleted

## 2012-10-09 MED ORDER — ATORVASTATIN CALCIUM 80 MG PO TABS: 40.0000 mg | ORAL_TABLET | Freq: Every day | ORAL | Status: DC

## 2012-10-23 ENCOUNTER — Encounter: Payer: Self-pay | Admitting: Family Medicine

## 2012-10-23 ENCOUNTER — Ambulatory Visit (INDEPENDENT_AMBULATORY_CARE_PROVIDER_SITE_OTHER): Payer: BC Managed Care – PPO | Admitting: Family Medicine

## 2012-10-23 VITALS — BP 138/86 | HR 83 | Temp 98.8°F | Wt 245.0 lb

## 2012-10-23 DIAGNOSIS — J329 Chronic sinusitis, unspecified: Secondary | ICD-10-CM

## 2012-10-23 MED ORDER — BENZONATATE 100 MG PO CAPS: 100.0000 mg | ORAL_CAPSULE | Freq: Two times a day (BID) | ORAL | Status: DC | PRN

## 2012-10-23 MED ORDER — AMOXICILLIN 875 MG PO TABS: 875.0000 mg | ORAL_TABLET | Freq: Two times a day (BID) | ORAL | Status: DC

## 2012-10-23 NOTE — Progress Notes (Signed)
Chief Complaint  Patient presents with  . Cough    congestion, low grade fever, chills, body aches     HPI:  Cold, Cough and Congestion: -started: about 1 week ago, not getting better -symptoms:nasal congestion, sore throat, cough, sinus pain and max tooth pain, low grade temp -denies:fever, SOB, NVD -has tried: delsum -sick contacts: husband was sick -Hx of: sinus infections No allergies to abx, not pregnant   ROS: See pertinent positives and negatives per HPI.  Past Medical History  Diagnosis Date  . PONV (postoperative nausea and vomiting)   . Hypertension   . Anxiety   . Depression     recent death of mother    Family History  Problem Relation Age of Onset  . Heart disease Mother 75    cad  . Lupus Mother   . COPD Mother   . Diabetes Father     History   Social History  . Marital Status: Single    Spouse Name: N/A    Number of Children: N/A  . Years of Education: N/A   Social History Main Topics  . Smoking status: Never Smoker   . Smokeless tobacco: Never Used  . Alcohol Use: Yes  . Drug Use: No  . Sexually Active: None   Other Topics Concern  . None   Social History Narrative  . None    Current outpatient prescriptions:amLODipine-benazepril (LOTREL) 5-10 MG per capsule, Take 1 capsule by mouth daily., Disp: , Rfl: ;  amLODipine-benazepril (LOTREL) 5-10 MG per capsule, TAKE 1 CAPSULE BY MOUTH DAILY., Disp: 30 capsule, Rfl: 5;  atorvastatin (LIPITOR) 80 MG tablet, Take 0.5 tablets (40 mg total) by mouth daily., Disp: 30 tablet, Rfl: 5 LORazepam (ATIVAN) 0.5 MG tablet, TAKE 1 TABLET BY MOUTH TWICE A DAY AS NEEDED, Disp: 60 tablet, Rfl: 1;  norgestimate-ethinyl estradiol (ORTHO-CYCLEN,SPRINTEC,PREVIFEM) 0.25-35 MG-MCG tablet, Take 1 tablet by mouth daily., Disp: , Rfl: ;  sertraline (ZOLOFT) 100 MG tablet, Take 1.5 tablets (150 mg total) by mouth daily., Disp: 45 tablet, Rfl: 5 traZODone (DESYREL) 100 MG tablet, Take 1 tablet by mouth at bedtime as  needed. For sleep, Disp: , Rfl: ;  amoxicillin (AMOXIL) 875 MG tablet, Take 1 tablet (875 mg total) by mouth 2 (two) times daily., Disp: 20 tablet, Rfl: 0;  benzonatate (TESSALON) 100 MG capsule, Take 1 capsule (100 mg total) by mouth 2 (two) times daily as needed for cough., Disp: 20 capsule, Rfl: 0  EXAM:  Filed Vitals:   10/23/12 1415  BP: 138/86  Pulse: 83  Temp: 98.8 F (37.1 C)    Body mass index is 40.14 kg/(m^2).  GENERAL: vitals reviewed and listed above, alert, oriented, appears well hydrated and in no acute distress  HEENT: atraumatic, conjunttiva clear, no obvious abnormalities on inspection of external nose and ears, normal appearance of ear canals and TMs, white thick nasal congestionR> L, mild post oropharyngeal erythema with PND, no tonsillar edema or exudate, R max sinus TTP   NECK: no obvious masses on inspection  LUNGS: clear to auscultation bilaterally, no wheezes, rales or rhonchi, good air movement  CV: HRRR, no peripheral edema  MS: moves all extremities without noticeable abnormality  PSYCH: pleasant and cooperative, no obvious depression or anxiety  ASSESSMENT AND PLAN:  Discussed the following assessment and plan:  Sinusitis - Plan: amoxicillin (AMOXIL) 875 MG tablet, benzonatate (TESSALON) 100 MG capsule  -Patient advised to return or notify a doctor immediately if symptoms worsen or persist or new concerns arise.  Patient Instructions  -As we discussed, we have prescribed a new medication for you at this appointment. We discussed the common and serious potential adverse effects of this medication and you can review these and more with the pharmacist when you pick up your medication.  Please follow the instructions for use carefully and notify us immediately if you have any problems taking this medication.  INSTRUCTIONS FOR UPPER RESPIRATORY INFECTION:  -plenty of rest and fluids  -nasal saline wash 2-3 times daily (use prepackaged nasal saline  or bottled/distilled water if making your own)    -can use sinex nasal spray for drainage and nasal congestion - but do NOT use longer then 3-4 days  -can use tylenol or ibuprofen as directed for aches and sorethroat  -in the winter time, using a humidifier at night is helpful (please follow cleaning instructions)  -if you are taking a cough medication - use only as directed, may also try a teaspoon of honey to coat the throat and throat lozenges  -for sore throat, salt water gargles can help  -follow up if you have fevers, facial pain, tooth pain, difficulty breathing or are worsening or not getting better in 5-7 days       Guy Seese R.

## 2012-10-23 NOTE — Patient Instructions (Signed)
-  As we discussed, we have prescribed a new medication for you at this appointment. We discussed the common and serious potential adverse effects of this medication and you can review these and more with the pharmacist when you pick up your medication.  Please follow the instructions for use carefully and notify us immediately if you have any problems taking this medication.  INSTRUCTIONS FOR UPPER RESPIRATORY INFECTION:  -plenty of rest and fluids  -nasal saline wash 2-3 times daily (use prepackaged nasal saline or bottled/distilled water if making your own)    -can use sinex nasal spray for drainage and nasal congestion - but do NOT use longer then 3-4 days  -can use tylenol or ibuprofen as directed for aches and sorethroat  -in the winter time, using a humidifier at night is helpful (please follow cleaning instructions)  -if you are taking a cough medication - use only as directed, may also try a teaspoon of honey to coat the throat and throat lozenges  -for sore throat, salt water gargles can help  -follow up if you have fevers, facial pain, tooth pain, difficulty breathing or are worsening or not getting better in 5-7 days

## 2013-01-31 HISTORY — PX: BRAIN SURGERY: SHX531

## 2013-02-15 ENCOUNTER — Ambulatory Visit (INDEPENDENT_AMBULATORY_CARE_PROVIDER_SITE_OTHER): Payer: BC Managed Care – PPO | Admitting: Family Medicine

## 2013-02-15 ENCOUNTER — Encounter: Payer: Self-pay | Admitting: Family Medicine

## 2013-02-15 VITALS — BP 120/90 | Temp 98.2°F | Wt 243.0 lb

## 2013-02-15 DIAGNOSIS — J310 Chronic rhinitis: Secondary | ICD-10-CM

## 2013-02-15 DIAGNOSIS — J329 Chronic sinusitis, unspecified: Secondary | ICD-10-CM

## 2013-02-15 MED ORDER — AMOXICILLIN 875 MG PO TABS: 875.0000 mg | ORAL_TABLET | Freq: Two times a day (BID) | ORAL | Status: DC

## 2013-02-15 MED ORDER — FLUTICASONE PROPIONATE 50 MCG/ACT NA SUSP: 2.0000 | Freq: Every day | NASAL | Status: DC

## 2013-02-15 NOTE — Patient Instructions (Signed)
-  afrin for the next 3-4 days, then STOP  -flonase 2 sprays each nostril daily   -use antibiotic if not improving in next 5-7 days or if worsening  -follow up as needed

## 2013-02-15 NOTE — Progress Notes (Signed)
Chief Complaint  Patient presents with  . Headache  . Sinusitis    HPI:  Acute visit for:  1)Sinus congestion: -started about 1 week ago -symptoms: nasal congestion, yellow mucus, drainage, L ear pain, sinus pain and pressure on R maxillary and frontal sinus pain, sneezing -denies: fevers, chills, NVD, vision changes, tooth pain -sick contacts: yes, work Acupuncturist -has tried flonase (few days), sinus rinse, Claritin daily -hx of seasonal allergies, hx of sinus infection one year ago  ROS: See pertinent positives and negatives per HPI.  Past Medical History  Diagnosis Date  . PONV (postoperative nausea and vomiting)   . Hypertension   . Anxiety   . Depression     recent death of mother    Family History  Problem Relation Age of Onset  . Heart disease Mother 27    cad  . Lupus Mother   . COPD Mother   . Diabetes Father     History   Social History  . Marital Status: Single    Spouse Name: N/A    Number of Children: N/A  . Years of Education: N/A   Social History Main Topics  . Smoking status: Never Smoker   . Smokeless tobacco: Never Used  . Alcohol Use: Yes  . Drug Use: No  . Sexually Active: None   Other Topics Concern  . None   Social History Narrative  . None    Current outpatient prescriptions:amLODipine-benazepril (LOTREL) 5-10 MG per capsule, Take 1 capsule by mouth daily., Disp: , Rfl: ;  amLODipine-benazepril (LOTREL) 5-10 MG per capsule, TAKE 1 CAPSULE BY MOUTH DAILY., Disp: 30 capsule, Rfl: 5;  atorvastatin (LIPITOR) 80 MG tablet, Take 0.5 tablets (40 mg total) by mouth daily., Disp: 30 tablet, Rfl: 5 LORazepam (ATIVAN) 0.5 MG tablet, TAKE 1 TABLET BY MOUTH TWICE A DAY AS NEEDED, Disp: 60 tablet, Rfl: 1;  norgestimate-ethinyl estradiol (ORTHO-CYCLEN,SPRINTEC,PREVIFEM) 0.25-35 MG-MCG tablet, Take 1 tablet by mouth daily., Disp: , Rfl: ;  sertraline (ZOLOFT) 100 MG tablet, Take 1.5 tablets (150 mg total) by mouth daily., Disp: 45 tablet, Rfl:  5 amoxicillin (AMOXIL) 875 MG tablet, Take 1 tablet (875 mg total) by mouth 2 (two) times daily., Disp: 20 tablet, Rfl: 0;  amoxicillin (AMOXIL) 875 MG tablet, Take 1 tablet (875 mg total) by mouth 2 (two) times daily., Disp: 20 tablet, Rfl: 0;  benzonatate (TESSALON) 100 MG capsule, Take 1 capsule (100 mg total) by mouth 2 (two) times daily as needed for cough., Disp: 20 capsule, Rfl: 0 fluticasone (FLONASE) 50 MCG/ACT nasal spray, Place 2 sprays into the nose daily., Disp: 16 g, Rfl: 1;  traZODone (DESYREL) 100 MG tablet, Take 1 tablet by mouth at bedtime as needed. For sleep, Disp: , Rfl:   EXAM:  Filed Vitals:   02/15/13 1107  BP: 120/90  Temp: 98.2 F (36.8 C)    Body mass index is 39.81 kg/(m^2).  GENERAL: vitals reviewed and listed above, alert, oriented, appears well hydrated and in no acute distress  HEENT: atraumatic, PERRLA, conjunttiva clear, no obvious abnormalities on inspection of external nose and ears, normal appearance of ear canals and TMs, clear nasal congestion with pale boggy turbinates, mild post oropharyngeal erythema with PND, no tonsillar edema or exudate, no sinus TTP  NEURO: CNII-XII grossly intact  NECK: no obvious masses on inspection  LUNGS: clear to auscultation bilaterally, no wheezes, rales or rhonchi, good air movement  CV: HRRR, no peripheral edema  MS: moves all extremities without noticeable abnormality  PSYCH: pleasant and cooperative, no obvious depression or anxiety  ASSESSMENT AND PLAN:  Discussed the following assessment and plan:  Rhinosinusitis - Plan: amoxicillin (AMOXIL) 875 MG tablet, fluticasone (FLONASE) 50 MCG/ACT nasal spray  -likely more allergic or viral per hx and exam, will tx per below, abx to hav eon hand if continued sinus pain or not improving -Recommendations per orders an instructions, risks and use of medications and return precautions discussed. -Patient advised to return or notify a doctor immediately if symptoms  worsen or persist or new concerns arise.  Patient Instructions  -afrin for the next 3-4 days, then STOP  -flonase 2 sprays each nostril daily   -use antibiotic if not improving in next 5-7 days or if worsening  -follow up as needed     Maddux First R.

## 2013-02-17 ENCOUNTER — Inpatient Hospital Stay (HOSPITAL_BASED_OUTPATIENT_CLINIC_OR_DEPARTMENT_OTHER)
Admission: EM | Admit: 2013-02-17 | Discharge: 2013-03-03 | DRG: 810 | Disposition: A | Discharge status: Home / Self Care | Payer: BC Managed Care – PPO | Attending: Neurosurgery | Admitting: Neurosurgery

## 2013-02-17 ENCOUNTER — Encounter (HOSPITAL_BASED_OUTPATIENT_CLINIC_OR_DEPARTMENT_OTHER): Payer: Self-pay | Admitting: *Deleted

## 2013-02-17 ENCOUNTER — Emergency Department (HOSPITAL_BASED_OUTPATIENT_CLINIC_OR_DEPARTMENT_OTHER): Payer: BC Managed Care – PPO

## 2013-02-17 DIAGNOSIS — Z8249 Family history of ischemic heart disease and other diseases of the circulatory system: Secondary | ICD-10-CM

## 2013-02-17 DIAGNOSIS — G919 Hydrocephalus, unspecified: Secondary | ICD-10-CM

## 2013-02-17 DIAGNOSIS — F3289 Other specified depressive episodes: Secondary | ICD-10-CM | POA: Diagnosis present

## 2013-02-17 DIAGNOSIS — E785 Hyperlipidemia, unspecified: Secondary | ICD-10-CM | POA: Diagnosis present

## 2013-02-17 DIAGNOSIS — I10 Essential (primary) hypertension: Secondary | ICD-10-CM | POA: Diagnosis present

## 2013-02-17 DIAGNOSIS — I639 Cerebral infarction, unspecified: Secondary | ICD-10-CM

## 2013-02-17 DIAGNOSIS — F329 Major depressive disorder, single episode, unspecified: Secondary | ICD-10-CM | POA: Diagnosis present

## 2013-02-17 DIAGNOSIS — F411 Generalized anxiety disorder: Secondary | ICD-10-CM | POA: Diagnosis present

## 2013-02-17 DIAGNOSIS — I609 Nontraumatic subarachnoid hemorrhage, unspecified: Principal | ICD-10-CM | POA: Diagnosis present

## 2013-02-17 HISTORY — DX: Hyperlipidemia, unspecified: E78.5

## 2013-02-17 LAB — PREGNANCY, URINE: Preg Test, Ur: NEGATIVE

## 2013-02-17 MED ORDER — KETOROLAC TROMETHAMINE 60 MG/2ML IM SOLN
60.0000 mg | Freq: Once | INTRAMUSCULAR | Status: DC
  Filled 2013-02-17: qty 2

## 2013-02-17 MED ORDER — METOCLOPRAMIDE HCL 5 MG/ML IJ SOLN
10.0000 mg | Freq: Once | INTRAMUSCULAR | Status: DC
  Filled 2013-02-17: qty 2

## 2013-02-17 MED ORDER — PROMETHAZINE HCL 25 MG/ML IJ SOLN
12.5000 mg | Freq: Once | INTRAMUSCULAR | Status: DC
  Filled 2013-02-17: qty 1

## 2013-02-17 MED ORDER — DIPHENHYDRAMINE HCL 50 MG/ML IJ SOLN
12.5000 mg | Freq: Once | INTRAMUSCULAR | Status: DC
  Filled 2013-02-17: qty 1

## 2013-02-17 NOTE — ED Notes (Signed)
Pt has had a sinus infection this week and was seen by her PMD. Pt took Afrin this evening and her head began hurting. Pt also vomited.

## 2013-02-18 ENCOUNTER — Inpatient Hospital Stay (HOSPITAL_COMMUNITY): Payer: BC Managed Care – PPO

## 2013-02-18 ENCOUNTER — Encounter (HOSPITAL_COMMUNITY): Payer: Self-pay | Admitting: *Deleted

## 2013-02-18 ENCOUNTER — Encounter (HOSPITAL_COMMUNITY): Payer: Self-pay | Admitting: Radiology

## 2013-02-18 ENCOUNTER — Inpatient Hospital Stay (HOSPITAL_COMMUNITY): Payer: BC Managed Care – PPO | Admitting: *Deleted

## 2013-02-18 ENCOUNTER — Encounter (HOSPITAL_COMMUNITY)
Admission: EM | Disposition: A | Discharge status: Home / Self Care | Payer: Self-pay | Source: Home / Self Care | Attending: Neurosurgery

## 2013-02-18 DIAGNOSIS — I609 Nontraumatic subarachnoid hemorrhage, unspecified: Principal | ICD-10-CM

## 2013-02-18 HISTORY — PX: RADIOLOGY WITH ANESTHESIA: SHX6223

## 2013-02-18 LAB — CBC WITH DIFFERENTIAL/PLATELET
Basophils Absolute: 0 10*3/uL (ref 0.0–0.1)
Basophils Relative: 0 % (ref 0–1)
Eosinophils Absolute: 0 10*3/uL (ref 0.0–0.7)
Eosinophils Relative: 0 % (ref 0–5)
HCT: 40.3 % (ref 36.0–46.0)
Hemoglobin: 13.9 g/dL (ref 12.0–15.0)
Lymphocytes Relative: 6 % — ABNORMAL LOW (ref 12–46)
Lymphs Abs: 1.1 10*3/uL (ref 0.7–4.0)
MCH: 31 pg (ref 26.0–34.0)
MCHC: 34.5 g/dL (ref 30.0–36.0)
MCV: 89.8 fL (ref 78.0–100.0)
Monocytes Absolute: 0.4 10*3/uL (ref 0.1–1.0)
Monocytes Relative: 3 % (ref 3–12)
Neutro Abs: 15.5 10*3/uL — ABNORMAL HIGH (ref 1.7–7.7)
Neutrophils Relative %: 91 % — ABNORMAL HIGH (ref 43–77)
Platelets: 308 10*3/uL (ref 150–400)
RBC: 4.49 MIL/uL (ref 3.87–5.11)
RDW: 11.8 % (ref 11.5–15.5)
WBC: 17 10*3/uL — ABNORMAL HIGH (ref 4.0–10.5)

## 2013-02-18 LAB — MRSA PCR SCREENING: MRSA by PCR: NEGATIVE

## 2013-02-18 LAB — BASIC METABOLIC PANEL
BUN: 9 mg/dL (ref 6–23)
CO2: 24 mEq/L (ref 19–32)
Calcium: 8.9 mg/dL (ref 8.4–10.5)
Chloride: 93 mEq/L — ABNORMAL LOW (ref 96–112)
Creatinine, Ser: 0.8 mg/dL (ref 0.50–1.10)
GFR calc Af Amer: >90 mL/min (ref >90)
GFR calc non Af Amer: >90 mL/min (ref >90)
Glucose, Bld: 191 mg/dL — ABNORMAL HIGH (ref 70–99)
Potassium: 3.4 mEq/L — ABNORMAL LOW (ref 3.5–5.1)
Sodium: 133 mEq/L — ABNORMAL LOW (ref 135–145)

## 2013-02-18 LAB — ABO/RH: ABO/RH(D): O POS

## 2013-02-18 LAB — POCT ACTIVATED CLOTTING TIME: Activated Clotting Time: 176 seconds

## 2013-02-18 LAB — PROTIME-INR
INR: 0.95 (ref 0.00–1.49)
Prothrombin Time: 12.6 seconds (ref 11.6–15.2)

## 2013-02-18 SURGERY — RADIOLOGY WITH ANESTHESIA
Anesthesia: General

## 2013-02-18 MED ORDER — HEPARIN SODIUM (PORCINE) 1000 UNIT/ML IJ SOLN
INTRAMUSCULAR | Status: DC | PRN
  Administered 2013-02-18 (×3): 1000 [IU] via INTRAVENOUS

## 2013-02-18 MED ORDER — FENTANYL CITRATE 0.05 MG/ML IJ SOLN
25.0000 ug | INTRAMUSCULAR | Status: DC | PRN
  Administered 2013-02-19 – 2013-02-20 (×9): 25 ug via INTRAVENOUS
  Filled 2013-02-18 (×10): qty 2

## 2013-02-18 MED ORDER — AMLODIPINE BESYLATE 5 MG PO TABS
5.0000 mg | ORAL_TABLET | Freq: Every day | ORAL | Status: DC
  Administered 2013-02-18 – 2013-03-03 (×14): 5 mg via ORAL
  Filled 2013-02-18 (×14): qty 1

## 2013-02-18 MED ORDER — PROPOFOL 10 MG/ML IV BOLUS
INTRAVENOUS | Status: DC | PRN
  Administered 2013-02-18: 150 mg via INTRAVENOUS

## 2013-02-18 MED ORDER — ONDANSETRON HCL 4 MG/2ML IJ SOLN
4.0000 mg | Freq: Four times a day (QID) | INTRAMUSCULAR | Status: DC | PRN
  Administered 2013-02-18 – 2013-02-19 (×2): 4 mg via INTRAVENOUS
  Filled 2013-02-18 (×3): qty 2

## 2013-02-18 MED ORDER — FENTANYL CITRATE 0.05 MG/ML IJ SOLN
INTRAMUSCULAR | Status: AC
  Filled 2013-02-18: qty 2

## 2013-02-18 MED ORDER — OXYCODONE HCL 5 MG PO TABS
5.0000 mg | ORAL_TABLET | ORAL | Status: DC | PRN
  Administered 2013-02-18: 5 mg via ORAL
  Administered 2013-02-18: 10 mg via ORAL
  Administered 2013-02-18: 5 mg via ORAL
  Administered 2013-02-18: 10 mg via ORAL
  Filled 2013-02-18: qty 1
  Filled 2013-02-18 (×2): qty 2
  Filled 2013-02-18: qty 1

## 2013-02-18 MED ORDER — CEFAZOLIN SODIUM-DEXTROSE 2-3 GM-% IV SOLR
INTRAVENOUS | Status: DC | PRN
  Administered 2013-02-18: 2 g via INTRAVENOUS

## 2013-02-18 MED ORDER — FENTANYL CITRATE 0.05 MG/ML IJ SOLN
INTRAMUSCULAR | Status: AC
  Filled 2013-02-18: qty 4

## 2013-02-18 MED ORDER — DIAZEPAM 5 MG PO TABS
5.0000 mg | ORAL_TABLET | Freq: Four times a day (QID) | ORAL | Status: DC | PRN
  Administered 2013-02-19 – 2013-03-03 (×32): 5 mg via ORAL
  Filled 2013-02-18 (×33): qty 1

## 2013-02-18 MED ORDER — MIDAZOLAM HCL 2 MG/2ML IJ SOLN
INTRAMUSCULAR | Status: DC | PRN
  Administered 2013-02-18: 1 mg via INTRAVENOUS

## 2013-02-18 MED ORDER — NIMODIPINE 60 MG/20ML PO SOLN
60.0000 mg | ORAL | Status: DC
  Filled 2013-02-18 (×14): qty 20

## 2013-02-18 MED ORDER — MORPHINE SULFATE 2 MG/ML IJ SOLN
INTRAMUSCULAR | Status: AC
  Administered 2013-02-18: 2 mg via INTRAVENOUS
  Filled 2013-02-18: qty 1

## 2013-02-18 MED ORDER — ONDANSETRON HCL 4 MG/2ML IJ SOLN
INTRAMUSCULAR | Status: AC
  Administered 2013-02-18: 4 mg via INTRAVENOUS
  Filled 2013-02-18: qty 2

## 2013-02-18 MED ORDER — BENZONATATE 100 MG PO CAPS
100.0000 mg | ORAL_CAPSULE | Freq: Two times a day (BID) | ORAL | Status: DC | PRN
  Filled 2013-02-18: qty 1

## 2013-02-18 MED ORDER — LABETALOL HCL 5 MG/ML IV SOLN
10.0000 mg | INTRAVENOUS | Status: DC | PRN
  Filled 2013-02-18: qty 4

## 2013-02-18 MED ORDER — MORPHINE SULFATE 2 MG/ML IJ SOLN
2.0000 mg | INTRAMUSCULAR | Status: DC | PRN
  Administered 2013-02-18 (×4): 2 mg via INTRAVENOUS
  Filled 2013-02-18 (×4): qty 1

## 2013-02-18 MED ORDER — AMLODIPINE BESY-BENAZEPRIL HCL 5-10 MG PO CAPS
1.0000 | ORAL_CAPSULE | Freq: Every day | ORAL | Status: DC
Start: 2013-02-18 — End: 2013-02-18

## 2013-02-18 MED ORDER — MIDAZOLAM HCL 5 MG/5ML IJ SOLN
INTRAMUSCULAR | Status: DC | PRN
  Administered 2013-02-18: 2 mg via INTRAVENOUS

## 2013-02-18 MED ORDER — ONDANSETRON HCL 4 MG/2ML IJ SOLN: 2.0000 mg | Freq: Four times a day (QID) | INTRAMUSCULAR | Status: DC | PRN

## 2013-02-18 MED ORDER — ONDANSETRON 4 MG PO TBDP: 4.0000 mg | ORAL_TABLET | Freq: Once | ORAL | Status: DC

## 2013-02-18 MED ORDER — ACETAMINOPHEN 500 MG PO TABS
1000.0000 mg | ORAL_TABLET | Freq: Four times a day (QID) | ORAL | Status: DC | PRN
  Filled 2013-02-18: qty 2

## 2013-02-18 MED ORDER — FENTANYL CITRATE 0.05 MG/ML IJ SOLN
100.0000 ug | Freq: Once | INTRAMUSCULAR | Status: AC
  Administered 2013-02-18: 100 ug via INTRAVENOUS
  Filled 2013-02-18: qty 2

## 2013-02-18 MED ORDER — FENTANYL CITRATE 0.05 MG/ML IJ SOLN
INTRAMUSCULAR | Status: DC | PRN
  Administered 2013-02-18: 100 ug via INTRAVENOUS
  Administered 2013-02-18: 200 ug via INTRAVENOUS
  Administered 2013-02-18: 50 ug via INTRAVENOUS

## 2013-02-18 MED ORDER — PANTOPRAZOLE SODIUM 40 MG IV SOLR
40.0000 mg | Freq: Every day | INTRAVENOUS | Status: DC
  Administered 2013-02-18: 40 mg via INTRAVENOUS
  Filled 2013-02-18 (×2): qty 40

## 2013-02-18 MED ORDER — SODIUM CHLORIDE 0.9 % IJ SOLN
1.5000 mg | INTRAVENOUS | Status: AC
  Filled 2013-02-18: qty 0.3

## 2013-02-18 MED ORDER — NORGESTIMATE-ETH ESTRADIOL 0.25-35 MG-MCG PO TABS
1.0000 | ORAL_TABLET | Freq: Every day | ORAL | Status: DC
  Administered 2013-02-18 – 2013-03-03 (×4): 1 via ORAL

## 2013-02-18 MED ORDER — ROCURONIUM BROMIDE 100 MG/10ML IV SOLN
INTRAVENOUS | Status: DC | PRN
  Administered 2013-02-18: 50 mg via INTRAVENOUS

## 2013-02-18 MED ORDER — LIDOCAINE HCL (CARDIAC) 20 MG/ML IV SOLN
INTRAVENOUS | Status: DC | PRN
  Administered 2013-02-18: 20 mg via INTRAVENOUS
  Administered 2013-02-18: 80 mg via INTRAVENOUS

## 2013-02-18 MED ORDER — ACETAMINOPHEN 325 MG PO TABS: 650.0000 mg | ORAL_TABLET | ORAL | Status: DC | PRN

## 2013-02-18 MED ORDER — BENAZEPRIL HCL 10 MG PO TABS
10.0000 mg | ORAL_TABLET | Freq: Every day | ORAL | Status: DC
  Administered 2013-02-18 – 2013-03-03 (×14): 10 mg via ORAL
  Filled 2013-02-18 (×14): qty 1

## 2013-02-18 MED ORDER — NIMODIPINE 30 MG PO CAPS
60.0000 mg | ORAL_CAPSULE | ORAL | Status: DC
  Administered 2013-02-18 – 2013-03-03 (×82): 60 mg via ORAL
  Filled 2013-02-18 (×87): qty 2

## 2013-02-18 MED ORDER — LACTATED RINGERS IV SOLN: INTRAVENOUS | Status: DC | PRN

## 2013-02-18 MED ORDER — IOHEXOL 300 MG/ML  SOLN
150.0000 mL | Freq: Once | INTRAMUSCULAR | Status: AC | PRN
  Administered 2013-02-18: 210 mL via INTRAVENOUS

## 2013-02-18 MED ORDER — ONDANSETRON HCL 4 MG/2ML IJ SOLN: 4.0000 mg | Freq: Once | INTRAMUSCULAR | Status: AC

## 2013-02-18 MED ORDER — SENNOSIDES-DOCUSATE SODIUM 8.6-50 MG PO TABS
1.0000 | ORAL_TABLET | Freq: Two times a day (BID) | ORAL | Status: DC
  Administered 2013-02-18 – 2013-03-03 (×26): 1 via ORAL
  Filled 2013-02-18 (×27): qty 1

## 2013-02-18 MED ORDER — IOHEXOL 350 MG/ML SOLN
50.0000 mL | Freq: Once | INTRAVENOUS | Status: AC | PRN
  Administered 2013-02-18: 50 mL via INTRAVENOUS

## 2013-02-18 MED ORDER — SUCCINYLCHOLINE CHLORIDE 20 MG/ML IJ SOLN
INTRAMUSCULAR | Status: DC | PRN
  Administered 2013-02-18: 100 mg via INTRAVENOUS

## 2013-02-18 MED ORDER — MIDAZOLAM HCL 2 MG/2ML IJ SOLN
INTRAMUSCULAR | Status: AC
Start: 2013-02-18 — End: 2013-02-18
  Filled 2013-02-18: qty 4

## 2013-02-18 MED ORDER — MANNITOL 25 % IV SOLN
50.0000 g | INTRAVENOUS | Status: AC
  Filled 2013-02-18: qty 200

## 2013-02-18 MED ORDER — SERTRALINE HCL 50 MG PO TABS
150.0000 mg | ORAL_TABLET | Freq: Every day | ORAL | Status: DC
  Administered 2013-02-18 – 2013-03-03 (×14): 150 mg via ORAL
  Filled 2013-02-18 (×14): qty 1

## 2013-02-18 MED ORDER — ATORVASTATIN CALCIUM 40 MG PO TABS
40.0000 mg | ORAL_TABLET | Freq: Every day | ORAL | Status: DC
  Administered 2013-02-19 – 2013-03-03 (×13): 40 mg via ORAL
  Filled 2013-02-18 (×14): qty 1

## 2013-02-18 MED ORDER — FENTANYL CITRATE 0.05 MG/ML IJ SOLN
100.0000 ug | Freq: Once | INTRAMUSCULAR | Status: AC
  Administered 2013-02-18: 100 ug via INTRAVENOUS

## 2013-02-18 MED ORDER — ACETAMINOPHEN 650 MG RE SUPP: 650.0000 mg | RECTAL | Status: DC | PRN

## 2013-02-18 MED ORDER — NICARDIPINE HCL IN NACL 20-0.86 MG/200ML-% IV SOLN: 5.0000 mg/h | INTRAVENOUS | Status: DC

## 2013-02-18 MED ORDER — FENTANYL CITRATE 0.05 MG/ML IJ SOLN
INTRAMUSCULAR | Status: DC | PRN
  Administered 2013-02-18: 25 ug via INTRAVENOUS

## 2013-02-18 MED ORDER — SODIUM CHLORIDE 0.9 % IV SOLN
INTRAVENOUS | Status: DC
  Administered 2013-02-18 – 2013-02-27 (×8): via INTRAVENOUS

## 2013-02-18 MED ORDER — ACETAMINOPHEN 650 MG RE SUPP: 650.0000 mg | Freq: Four times a day (QID) | RECTAL | Status: DC | PRN

## 2013-02-18 MED ORDER — CEFAZOLIN SODIUM-DEXTROSE 2-3 GM-% IV SOLR
2.0000 g | Freq: Once | INTRAVENOUS | Status: AC
  Administered 2013-02-18: 2 g via INTRAVENOUS
  Filled 2013-02-18: qty 50

## 2013-02-18 MED ORDER — CHLORHEXIDINE GLUCONATE 0.12 % MT SOLN
OROMUCOSAL | Status: AC
  Filled 2013-02-18: qty 15

## 2013-02-18 MED ORDER — SODIUM CHLORIDE 0.9 % IV SOLN: INTRAVENOUS | Status: DC

## 2013-02-18 NOTE — ED Notes (Signed)
NIH not completed due to pt staying strict NPO per MD.

## 2013-02-18 NOTE — H&P (Signed)
[name] is an 42 y.o. female.   Chief Complaint: Severe headache 4 days ago Treated for "sinusitis" Headache continued and worsened To ER--CT reveals SAH Scheduled now for cerebral arteriogram and possible embolization HPI: Mother had Cerebral aneurysm clipping yrs ago HTN; HLD  Past Medical History  Diagnosis Date  . PONV (postoperative nausea and vomiting)   . Hypertension   . Anxiety   . Depression     recent death of mother  . Hyperlipidemia     Past Surgical History  Procedure Laterality Date  . Myomectomy  2009  . Laparotomy    . Myomectomy  05/16/2011    Procedure: MYOMECTOMY;  Surgeon: Meriel Pica;  Location: [facility];  Service: Gynecology;  Laterality: N/A;  abdominal    Family History  Problem Relation Age of Onset  . Heart disease Mother 42    cad  . Lupus Mother   . COPD Mother   . Diabetes Father    Social History:  reports that she has never smoked. She has never used smokeless tobacco. She reports that  drinks alcohol. She reports that she does not use illicit drugs.  Allergies: No Known Allergies  Medications Prior to Admission  Medication Sig Dispense Refill  . amLODipine-benazepril (LOTREL) 5-10 MG per capsule Take 1 capsule by mouth daily.      Marland Kitchen amLODipine-benazepril (LOTREL) 5-10 MG per capsule TAKE 1 CAPSULE BY MOUTH DAILY.  30 capsule  5  . amoxicillin (AMOXIL) 875 MG tablet Take 1 tablet (875 mg total) by mouth 2 (two) times daily.  20 tablet  0  . amoxicillin (AMOXIL) 875 MG tablet Take 1 tablet (875 mg total) by mouth 2 (two) times daily.  20 tablet  0  . atorvastatin (LIPITOR) 80 MG tablet Take 0.5 tablets (40 mg total) by mouth daily.  30 tablet  5  . benzonatate (TESSALON) 100 MG capsule Take 1 capsule (100 mg total) by mouth 2 (two) times daily as needed for cough.  20 capsule  0  . fluticasone (FLONASE) 50 MCG/ACT nasal spray Place 2 sprays into the nose daily.  16 g  1  . LORazepam (ATIVAN) 0.5 MG tablet TAKE 1 TABLET BY  MOUTH TWICE A DAY AS NEEDED  60 tablet  1  . norgestimate-ethinyl estradiol (ORTHO-CYCLEN,SPRINTEC,PREVIFEM) 0.25-35 MG-MCG tablet Take 1 tablet by mouth daily.      . sertraline (ZOLOFT) 100 MG tablet Take 1.5 tablets (150 mg total) by mouth daily.  45 tablet  5  . traZODone (DESYREL) 100 MG tablet Take 1 tablet by mouth at bedtime as needed. For sleep        Results for orders placed during the hospital encounter of 02/17/13 (from the past 48 hour(s))  PREGNANCY, URINE     Status: None   Collection Time    02/17/13 11:43 PM      Result Value Range   Preg Test, Ur NEGATIVE  NEGATIVE   Comment:            THE SENSITIVITY OF THIS     METHODOLOGY IS >20 mIU/mL.  CBC WITH DIFFERENTIAL     Status: Abnormal   Collection Time    02/18/13 12:26 AM      Result Value Range   WBC 17.0 (*) 4.0 - 10.5 K/uL   RBC 4.49  3.87 - 5.11 MIL/uL   Hemoglobin 13.9  12.0 - 15.0 g/dL   HCT 62.1  30.8 - 65.7 %   MCV 89.8  78.0 - 100.0 fL   MCH 31.0  26.0 - 34.0 pg   MCHC 34.5  30.0 - 36.0 g/dL   RDW 16.1  09.6 - 04.5 %   Platelets 308  150 - 400 K/uL   Neutrophils Relative % 91 (*) 43 - 77 %   Neutro Abs 15.5 (*) 1.7 - 7.7 K/uL   Lymphocytes Relative 6 (*) 12 - 46 %   Lymphs Abs 1.1  0.7 - 4.0 K/uL   Monocytes Relative 3  3 - 12 %   Monocytes Absolute 0.4  0.1 - 1.0 K/uL   Eosinophils Relative 0  0 - 5 %   Eosinophils Absolute 0.0  0.0 - 0.7 K/uL   Basophils Relative 0  0 - 1 %   Basophils Absolute 0.0  0.0 - 0.1 K/uL  BASIC METABOLIC PANEL     Status: Abnormal   Collection Time    02/18/13 12:26 AM      Result Value Range   Sodium 133 (*) 135 - 145 mEq/L   Potassium 3.4 (*) 3.5 - 5.1 mEq/L   Chloride 93 (*) 96 - 112 mEq/L   CO2 24  19 - 32 mEq/L   Glucose, Bld 191 (*) 70 - 99 mg/dL   BUN 9  6 - 23 mg/dL   Creatinine, Ser 4.09  0.50 - 1.10 mg/dL   Calcium 8.9  8.4 - 81.1 mg/dL   GFR calc non Af Amer >90  >90 mL/min   GFR calc Af Amer >90  >90 mL/min   Comment:            The eGFR has been  calculated     using the CKD EPI equation.     This calculation has not been     validated in all clinical     situations.     eGFR's persistently     <90 mL/min signify     possible Chronic Kidney Disease.  PROTIME-INR     Status: None   Collection Time    02/18/13 12:26 AM      Result Value Range   Prothrombin Time 12.6  11.6 - 15.2 seconds   INR 0.95  0.00 - 1.49  MRSA PCR SCREENING     Status: None   Collection Time    02/18/13  2:23 AM      Result Value Range   MRSA by PCR NEGATIVE  NEGATIVE   Comment:            The GeneXpert MRSA Assay (FDA     approved for NASAL specimens     only), is one component of a     comprehensive MRSA colonization     surveillance program. It is not     intended to diagnose MRSA     infection nor to guide or     monitor treatment for     MRSA infections.   Ct Head Wo Contrast  02/18/2013   *RADIOLOGY REPORT*  Clinical Data: Severe headache.  Nausea and vomiting.  Bilateral leg weakness.  CT HEAD WITHOUT CONTRAST  Technique:  Contiguous axial images were obtained from the base of the skull through the vertex without contrast.  Comparison: No priors.  Findings: There is a moderate volume of extra-axial blood overlying the cerebral hemispheres bilaterally.  This appears to be subarachnoid in position, and is adjacent to the medial aspects of the frontal lobes bilaterally, along the undersurface of the frontal lobes bilaterally (left greater than right,  anterior surface of the left temporal lobe, and tracking along the right side of the tentorium cerebelli.  There is also small amount of intra ventricular blood best seen within the cerebral aqueduct and fourth ventricle.  Mild rounding of the right ventricle and temporal horns of the lateral ventricles bilaterally suggests some mild hydrocephalus.  No definite intraparenchymal hemorrhage is identified within the cerebrum or cerebellum.  No definite mass. No definite signs of acute/subacute cerebral  ischemia.  No acute displaced skull fractures are identified.  Visualized paranasal sinuses and mastoids are well pneumatized.  IMPRESSION: Moderate volume of subarachnoid hemorrhage overlying the cerebral hemispheres bilaterally, as well as small volume of intraventricular blood within the cerebral aqueduct and fourth ventricle, with evidence of early obstructive hydrocephalus.  This is presumably related to rupture of an aneurysm, although the epicenter of this bleeding is unclear, it is favored to be in the region of the left ACA/MCA territories.  Consultation with neurosurgery is recommended.  Correlation with a head CTA or MRA may be warranted.  Critical Value/emergent results were called by telephone at the time of interpretation on 02/18/2013 at 12:15 a.m. to Dr. Nicanor Alcon- Rasch, who verbally acknowledged these results.   Original Report Authenticated By: Trudie Reed, M.D.    Review of Systems  Constitutional: Negative for fever.  HENT: Negative for neck pain and tinnitus.   Eyes: Negative for blurred vision.  Respiratory: Negative for cough.   Cardiovascular: Negative for chest pain.  Gastrointestinal: Negative for nausea, vomiting and abdominal pain.  Neurological: Positive for headaches. Negative for dizziness.    Blood pressure 98/84, pulse 79, temperature 98.2 F (36.8 C), temperature source Oral, resp. rate 20, height 5\' 6"  (1.676 m), weight 193 lb 2 oz (87.6 kg), last menstrual period 01/28/2013, SpO2 98.00%. Physical Exam  Constitutional: She is oriented to person, place, and time. She appears well-developed and well-nourished.  Cardiovascular: Normal rate and normal heart sounds.   No murmur heard. Respiratory: Effort normal and breath sounds normal. She has no wheezes.  GI: Soft. Bowel sounds are normal. There is no tenderness.  Musculoskeletal: Normal range of motion.  Neurological: She is alert and oriented to person, place, and time. No cranial nerve deficit.  Skin: Skin  is warm and dry.  Psychiatric: She has a normal mood and affect. Her behavior is normal. Judgment and thought content normal.     Assessment/Plan Headache x days- worsening CT shows SAH Scheduled now for cerebral arteriogram and possible embolization Pt aware of procedure benefits and risks and agreeable to proceed Consent signed and in chart  Jasyn Mey A 02/18/2013, 7:07 AM

## 2013-02-18 NOTE — Procedures (Signed)
S/P 4 vessel cerebral arteriogram. RT CFA approach Findings. 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

## 2013-02-18 NOTE — Progress Notes (Signed)
Patient ID: [name], female   DOB: 1971-07-13, 42 y.o.   MRN: [id number] BP 143/90  Pulse 83  Temp(Src) 98.2 F (36.8 C) (Oral)  Resp 18  Ht 5\' 6"  (1.676 m)  Wt 87.6 kg (193 lb 2 oz)  BMI 31.19 kg/m2  SpO2 100%  LMP 01/28/2013 Alert and oriented x 4, speech is clear and fluent.  She was coiled today without difficulty. Continue fluid hydration.

## 2013-02-18 NOTE — Progress Notes (Signed)
Patient ID: [name], female   DOB: September 23, 1970, 42 y.o.   MRN: [id number]  Post procedure. Extubated. C/O bifrontal 7/10 H/A. Relieved with morphine.. Nausea+. No vomiting. Denies any motor ,sensory speech or visual issues. VS  BP 120s/80s. HR 80s 90s SR  PaO2 > 95 % RA. Neuor.  AA Ox3. Speech and comprehension N. PERLA 3 mm R= L  EOMS full  VF full to confrontation. Facies symmetrical . Tongue midline. Motor Sensory Coordination WNLs. RT Groin soft .Marland Kitchen No hematoma.  Plan . 1.Close neuroobs. 2.Advance to liquid diet when patient upright at 30 degrees,as tolerated. D/W patient and family

## 2013-02-18 NOTE — Preoperative (Signed)
Beta Blockers   Reason not to administer Beta Blockers:Not Applicable 

## 2013-02-18 NOTE — H&P (Signed)
[name] is an 42 y.o. female.   Chief Complaint: headache, emesis HPI: [name] was in Tara usual state of health until this weekend when she had a severe headache. She did see Tara primary care physician who felt this was a sinus infection. She was given antibiotics, and a nasal spray. [name] did not improve and eventually the headache became much worse and led to Tara vomiting today. She went to the [facility] and was found on CT to have a SAH. She was transferred here for further evaluation. 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   Past Medical History  Diagnosis Date  . PONV (postoperative nausea and vomiting)   . Hypertension   . Anxiety   . Depression     recent death of Dougherty  . Hyperlipidemia     Past Surgical History  Procedure Laterality Date  . Myomectomy  2009  . Laparotomy    . Myomectomy  05/16/2011    Procedure: MYOMECTOMY;  Surgeon: Meriel Pica;  Location: [facility];  Service: Gynecology;  Laterality: N/A;  abdominal    Family History  Problem Relation Age of Onset  . Heart disease Dougherty 68    cad  . Lupus Dougherty   . COPD Dougherty   . Diabetes Father    Social History:  reports that she has never smoked. She has never used smokeless tobacco. She reports that  drinks alcohol. She reports that she does not use illicit drugs.  Allergies: No Known Allergies  Medications Prior to Admission  Medication Sig Dispense Refill  . amLODipine-benazepril (LOTREL) 5-10 MG per capsule Take 1 capsule by mouth daily.      Marland Kitchen amLODipine-benazepril (LOTREL) 5-10 MG per capsule TAKE 1 CAPSULE BY MOUTH DAILY.  30 capsule  5  . amoxicillin (AMOXIL) 875 MG tablet Take 1 tablet (875 mg total) by mouth 2 (two) times daily.  20 tablet  0  . amoxicillin (AMOXIL) 875 MG tablet Take 1 tablet (875 mg total) by mouth 2 (two) times daily.  20 tablet  0  . atorvastatin (LIPITOR) 80 MG tablet Take 0.5 tablets (40 mg total) by mouth daily.  30 tablet   5  . benzonatate (TESSALON) 100 MG capsule Take 1 capsule (100 mg total) by mouth 2 (two) times daily as needed for cough.  20 capsule  0  . fluticasone (FLONASE) 50 MCG/ACT nasal spray Place 2 sprays into the nose daily.  16 g  1  . LORazepam (ATIVAN) 0.5 MG tablet TAKE 1 TABLET BY MOUTH TWICE A DAY AS NEEDED  60 tablet  1  . norgestimate-ethinyl estradiol (ORTHO-CYCLEN,SPRINTEC,PREVIFEM) 0.25-35 MG-MCG tablet Take 1 tablet by mouth daily.      . sertraline (ZOLOFT) 100 MG tablet Take 1.5 tablets (150 mg total) by mouth daily.  45 tablet  5  . traZODone (DESYREL) 100 MG tablet Take 1 tablet by mouth at bedtime as needed. For sleep        Results for orders placed during the hospital encounter of 02/17/13 (from the past 48 hour(s))  PREGNANCY, URINE     Status: None   Collection Time    02/17/13 11:43 PM      Result Value Range   Preg Test, Ur NEGATIVE  NEGATIVE   Comment:            THE SENSITIVITY OF THIS     METHODOLOGY IS >20 mIU/mL.  CBC WITH DIFFERENTIAL     Status: Abnormal  Collection Time    02/18/13 12:26 AM      Result Value Range   WBC 17.0 (*) 4.0 - 10.5 K/uL   RBC 4.49  3.87 - 5.11 MIL/uL   Hemoglobin 13.9  12.0 - 15.0 g/dL   HCT 16.1  09.6 - 04.5 %   MCV 89.8  78.0 - 100.0 fL   MCH 31.0  26.0 - 34.0 pg   MCHC 34.5  30.0 - 36.0 g/dL   RDW 40.9  81.1 - 91.4 %   Platelets 308  150 - 400 K/uL   Neutrophils Relative % 91 (*) 43 - 77 %   Neutro Abs 15.5 (*) 1.7 - 7.7 K/uL   Lymphocytes Relative 6 (*) 12 - 46 %   Lymphs Abs 1.1  0.7 - 4.0 K/uL   Monocytes Relative 3  3 - 12 %   Monocytes Absolute 0.4  0.1 - 1.0 K/uL   Eosinophils Relative 0  0 - 5 %   Eosinophils Absolute 0.0  0.0 - 0.7 K/uL   Basophils Relative 0  0 - 1 %   Basophils Absolute 0.0  0.0 - 0.1 K/uL  BASIC METABOLIC PANEL     Status: Abnormal   Collection Time    02/18/13 12:26 AM      Result Value Range   Sodium 133 (*) 135 - 145 mEq/L   Potassium 3.4 (*) 3.5 - 5.1 mEq/L   Chloride 93 (*) 96 -  112 mEq/L   CO2 24  19 - 32 mEq/L   Glucose, Bld 191 (*) 70 - 99 mg/dL   BUN 9  6 - 23 mg/dL   Creatinine, Ser 7.82  0.50 - 1.10 mg/dL   Calcium 8.9  8.4 - 95.6 mg/dL   GFR calc non Af Amer >90  >90 mL/min   GFR calc Af Amer >90  >90 mL/min   Comment:            The eGFR has been calculated     using the CKD EPI equation.     This calculation has not been     validated in all clinical     situations.     eGFR's persistently     <90 mL/min signify     possible Chronic Kidney Disease.  PROTIME-INR     Status: None   Collection Time    02/18/13 12:26 AM      Result Value Range   Prothrombin Time 12.6  11.6 - 15.2 seconds   INR 0.95  0.00 - 1.49   Ct Head Wo Contrast  02/18/2013   *RADIOLOGY REPORT*  Clinical Data: Severe headache.  Nausea and vomiting.  Bilateral leg weakness.  CT HEAD WITHOUT CONTRAST  Technique:  Contiguous axial images were obtained from the base of the skull through the vertex without contrast.  Comparison: No priors.  Findings: There is a moderate volume of extra-axial blood overlying the cerebral hemispheres bilaterally.  This appears to be subarachnoid in position, and is adjacent to the medial aspects of the frontal lobes bilaterally, along the undersurface of the frontal lobes bilaterally (left greater than right, anterior surface of the left temporal lobe, and tracking along the right side of the tentorium cerebelli.  There is also small amount of intra ventricular blood best seen within the cerebral aqueduct and fourth ventricle.  Mild rounding of the right ventricle and temporal horns of the lateral ventricles bilaterally suggests some mild hydrocephalus.  No definite intraparenchymal hemorrhage is identified within  the cerebrum or cerebellum.  No definite mass. No definite signs of acute/subacute cerebral ischemia.  No acute displaced skull fractures are identified.  Visualized paranasal sinuses and mastoids are well pneumatized.  IMPRESSION: Moderate volume of  subarachnoid hemorrhage overlying the cerebral hemispheres bilaterally, as well as small volume of intraventricular blood within the cerebral aqueduct and fourth ventricle, with evidence of early obstructive hydrocephalus.  This is presumably related to rupture of an aneurysm, although the epicenter of this bleeding is unclear, it is favored to be in the region of the left ACA/MCA territories.  Consultation with neurosurgery is recommended.  Correlation with a head CTA or MRA may be warranted.  Critical Value/emergent results were called by telephone at the time of interpretation on 02/18/2013 at 12:15 a.m. to Dr. Nicanor Alcon- Rasch, who verbally acknowledged these results.   Original Report Authenticated By: Trudie Reed, M.D.    Review of Systems  Constitutional: Negative.   HENT: Positive for neck pain.   Eyes: Negative.   Respiratory: Positive for cough.   Cardiovascular: Negative.   Gastrointestinal: Negative.   Genitourinary: Negative.   Skin: Negative.   Neurological: Positive for headaches. Negative for tingling, tremors, sensory change, speech change, focal weakness, seizures and loss of consciousness.  Endo/Heme/Allergies: Negative.   Psychiatric/Behavioral: Negative.     Blood pressure 138/85, pulse 79, temperature 97.8 F (36.6 C), temperature source Oral, resp. rate 22, height 5\' 6"  (1.676 m), weight 87.6 kg (193 lb 2 oz), last menstrual period 01/28/2013, SpO2 98.00%. Physical Exam  Constitutional: She is oriented to person, place, and time. She appears well-developed and well-nourished. No distress.  HENT:  Head: Normocephalic and atraumatic.  Right Ear: External ear normal.  Left Ear: External ear normal.  Nose: Nose normal.  Mouth/Throat: Oropharynx is clear and moist.  Eyes: Conjunctivae and EOM are normal. Pupils are equal, round, and reactive to light.  Cardiovascular: Normal rate, regular rhythm, normal heart sounds and intact distal pulses.   Respiratory: Effort  normal and breath sounds normal.  GI: Soft. Bowel sounds are normal.  Neurological: She is alert and oriented to person, place, and time. She has normal strength and normal reflexes. She is not disoriented. No cranial nerve deficit or sensory deficit. Coordination normal. She displays no Babinski's sign on the right side. She displays no Babinski's sign on the left side.     Assessment/Plan Mrs. Jaimes has a SAH, hunt-hess grade 1. Tara neurologic exam is normal. I will order a CTA and proceed from there. I have explained the situation to Mrs. Pala and answered Tara questions.   Madine Sarr L 02/18/2013, 2:41 AM

## 2013-02-18 NOTE — Progress Notes (Addendum)
VASCULAR LAB PRELIMINARY  PRELIMINARY  PRELIMINARY  PRELIMINARY  Transcranial Doppler  Date POD PCO2 HCT BP  MCA ACA PCA OPHT SIPH VERT Basilar  6-19- 14 SB     Right  Left   55  50   -29  -42   25  30/12   14  13    32  44   --  -20     -34    6-20- 14 SB     Right  Left   78  54   -42  -44   26  -23   13  14    50  52   -20  -38     -35    6-23- 14 SB     Right  Left   58  64   -75  -57   36  33   15  17   41  47   -23  -25     -47    6-25- 14 SB      Right  Left   46  40   -50  -27   17  -16   12  14    37/61  --   -31  -25     -33          Right  Left                                            Right  Left                                            Right  Left                                        MCA = Middle Cerebral Artery      OPHT = Opthalmic Artery     BASILAR = Basilar Artery   ACA = Anterior Cerebral Artery     SIPH = Carotid Siphon PCA = Posterior Cerebral Artery   VERT = Verterbral Artery                   Normal MCA = 62+\-12 ACA = 50+\-12 PCA = 42+\-23         Sem Mccaughey, RVT 02/18/2013, 2:56 PM

## 2013-02-18 NOTE — Progress Notes (Signed)
02/18/13 0230  Pt passed stroke swallow screen. Pt given po meds with sips of water. No diet ordered at this time.   Elisha Headland RN

## 2013-02-18 NOTE — ED Notes (Signed)
Patient transported to CT 

## 2013-02-18 NOTE — Procedures (Signed)
Extubation Procedure Note  Patient Details:   Name: ALLICIA CULLEY DOB: 1971/02/18 MRN: [id number]   Airway Documentation:     Evaluation  O2 sats: stable throughout Complications: No apparent complications Patient did tolerate procedure well. Bilateral Breath Sounds: Clear   Yes Patient tolerated wean, ordered by MD, was positive for a cuff leak. Patient extubated to a 4 Lpm nasal cannula. Patient resting comfortably. RT will continue to monitor.   Ancil Boozer 02/18/2013, 1:42 PM

## 2013-02-18 NOTE — ED Provider Notes (Signed)
History     CSN: [id number]  Arrival date & time 02/17/13  2318   First MD Initiated Contact with Patient 02/17/13 2343      Chief Complaint  Patient presents with  . Headache    (Consider location/radiation/quality/duration/timing/severity/associated sxs/prior treatment) Patient is a 42 y.o. female presenting with headaches. The history is provided by the patient.  Headache Pain location:  Frontal Radiates to: neck. Severity currently:  10/10 (prior to meds) Severity at highest:  10/10 Onset quality:  Gradual Duration:  5 days Timing:  Constant Progression:  Worsening Chronicity:  New Similar to prior headaches: no   Context: bright light   Relieved by:  Nothing Worsened by:  Nothing tried Ineffective treatments:  NSAIDs (flonase and ibuprofen) Associated symptoms: vomiting   Associated symptoms: no fever, no neck stiffness, no numbness and no seizures   Risk factors: lifestyle not sedentary     Past Medical History  Diagnosis Date  . PONV (postoperative nausea and vomiting)   . Hypertension   . Anxiety   . Depression     recent death of mother  . Hyperlipidemia     Past Surgical History  Procedure Laterality Date  . Myomectomy  2009  . Laparotomy    . Myomectomy  05/16/2011    Procedure: MYOMECTOMY;  Surgeon: Meriel Pica;  Location: [facility];  Service: Gynecology;  Laterality: N/A;  abdominal    Family History  Problem Relation Age of Onset  . Heart disease Mother 23    cad  . Lupus Mother   . COPD Mother   . Diabetes Father     History  Substance Use Topics  . Smoking status: Never Smoker   . Smokeless tobacco: Never Used  . Alcohol Use: Yes    OB History   Grav Para Term Preterm Abortions TAB SAB Ect Mult Living                  Review of Systems  Constitutional: Negative for fever.  HENT: Negative for neck stiffness.   Gastrointestinal: Positive for vomiting.  Neurological: Positive for headaches. Negative for seizures, facial  asymmetry, speech difficulty, weakness and numbness.  All other systems reviewed and are negative.    Allergies  Review of patient's allergies indicates no known allergies.  Home Medications   Current Outpatient Rx  Name  Route  Sig  Dispense  Refill  . amLODipine-benazepril (LOTREL) 5-10 MG per capsule   Oral   Take 1 capsule by mouth daily.         Marland Kitchen amLODipine-benazepril (LOTREL) 5-10 MG per capsule      TAKE 1 CAPSULE BY MOUTH DAILY.   30 capsule   5   . amoxicillin (AMOXIL) 875 MG tablet   Oral   Take 1 tablet (875 mg total) by mouth 2 (two) times daily.   20 tablet   0   . amoxicillin (AMOXIL) 875 MG tablet   Oral   Take 1 tablet (875 mg total) by mouth 2 (two) times daily.   20 tablet   0   . atorvastatin (LIPITOR) 80 MG tablet   Oral   Take 0.5 tablets (40 mg total) by mouth daily.   30 tablet   5   . benzonatate (TESSALON) 100 MG capsule   Oral   Take 1 capsule (100 mg total) by mouth 2 (two) times daily as needed for cough.   20 capsule   0   . fluticasone (FLONASE) 50 MCG/ACT nasal  spray   Nasal   Place 2 sprays into the nose daily.   16 g   1   . LORazepam (ATIVAN) 0.5 MG tablet      TAKE 1 TABLET BY MOUTH TWICE A DAY AS NEEDED   60 tablet   1   . norgestimate-ethinyl estradiol (ORTHO-CYCLEN,SPRINTEC,PREVIFEM) 0.25-35 MG-MCG tablet   Oral   Take 1 tablet by mouth daily.         . sertraline (ZOLOFT) 100 MG tablet   Oral   Take 1.5 tablets (150 mg total) by mouth daily.   45 tablet   5   . traZODone (DESYREL) 100 MG tablet   Oral   Take 1 tablet by mouth at bedtime as needed. For sleep           BP 151/102  Pulse 73  Temp(Src) 97.5 F (36.4 C) (Oral)  Resp 18  Ht 5\' 6"  (1.676 m)  Wt 240 lb (108.863 kg)  BMI 38.76 kg/m2  SpO2 100%  LMP 01/28/2013  Physical Exam  Constitutional: She is oriented to person, place, and time. She appears well-developed and well-nourished. No distress.  HENT:  Head: Normocephalic and  atraumatic.  Mouth/Throat: Oropharynx is clear and moist.  Eyes: Conjunctivae and EOM are normal. Pupils are equal, round, and reactive to light.  Neck: Normal range of motion.  Cardiovascular: Normal rate, regular rhythm and intact distal pulses.   Pulmonary/Chest: Effort normal and breath sounds normal. She has no wheezes. She has no rales.  Abdominal: Soft. Bowel sounds are normal. There is no tenderness. There is no rebound and no guarding.  Musculoskeletal: Normal range of motion.  Lymphadenopathy:    She has no cervical adenopathy.  Neurological: She is alert and oriented to person, place, and time. She has normal reflexes. No cranial nerve deficit.  Skin: Skin is warm and dry.  Psychiatric: She has a normal mood and affect.    ED Course  Procedures (including critical care time)  Labs Reviewed  PREGNANCY, URINE   No results found.   No diagnosis found.    MDM  MDM Reviewed: vitals and nursing note Reviewed previous: labs, x-ray and ECG Interpretation: CT scan and labs Total time providing critical care: 30-74 minutes. This excludes time spent performing separately reportable procedures and services. Consults: neurosurgery  glucose elevated at 191 on review of labs WBC elevated at 17, likely due to demarginization     Case d/w Dr. Mikal Plane, admit to 3100, no indication for steroids at this time   Has had 2 doses of 100 mcg of fentanyl-- prior to transfer  Medications  fentaNYL (SUBLIMAZE) injection 100 mcg (not administered)  fentaNYL (SUBLIMAZE) 0.05 MG/ML injection (not administered)  fentaNYL (SUBLIMAZE) injection 100 mcg (100 mcg Intravenous Given 02/18/13 0031)  ondansetron (ZOFRAN) injection 4 mg (4 mg Intravenous Given 02/18/13 0031)    Results for orders placed during the hospital encounter of 02/17/13  PREGNANCY, URINE      Result Value Range   Preg Test, Ur NEGATIVE  NEGATIVE  CBC WITH DIFFERENTIAL      Result Value Range   WBC 17.0 (*) 4.0 -  10.5 K/uL   RBC 4.49  3.87 - 5.11 MIL/uL   Hemoglobin 13.9  12.0 - 15.0 g/dL   HCT 44.0  34.7 - 42.5 %   MCV 89.8  78.0 - 100.0 fL   MCH 31.0  26.0 - 34.0 pg   MCHC 34.5  30.0 - 36.0 g/dL   RDW 11.8  11.5 - 15.5 %   Platelets 308  150 - 400 K/uL   Neutrophils Relative % 91 (*) 43 - 77 %   Neutro Abs 15.5 (*) 1.7 - 7.7 K/uL   Lymphocytes Relative 6 (*) 12 - 46 %   Lymphs Abs 1.1  0.7 - 4.0 K/uL   Monocytes Relative 3  3 - 12 %   Monocytes Absolute 0.4  0.1 - 1.0 K/uL   Eosinophils Relative 0  0 - 5 %   Eosinophils Absolute 0.0  0.0 - 0.7 K/uL   Basophils Relative 0  0 - 1 %   Basophils Absolute 0.0  0.0 - 0.1 K/uL  BASIC METABOLIC PANEL      Result Value Range   Sodium 133 (*) 135 - 145 mEq/L   Potassium 3.4 (*) 3.5 - 5.1 mEq/L   Chloride 93 (*) 96 - 112 mEq/L   CO2 24  19 - 32 mEq/L   Glucose, Bld 191 (*) 70 - 99 mg/dL   BUN 9  6 - 23 mg/dL   Creatinine, Ser 1.47  0.50 - 1.10 mg/dL   Calcium 8.9  8.4 - 82.9 mg/dL   GFR calc non Af Amer >90  >90 mL/min   GFR calc Af Amer >90  >90 mL/min  PROTIME-INR      Result Value Range   Prothrombin Time 12.6  11.6 - 15.2 seconds   INR 0.95  0.00 - 1.49   Ct Head Wo Contrast  02/18/2013   *RADIOLOGY REPORT*  Clinical Data: Severe headache.  Nausea and vomiting.  Bilateral leg weakness.  CT HEAD WITHOUT CONTRAST  Technique:  Contiguous axial images were obtained from the base of the skull through the vertex without contrast.  Comparison: No priors.  Findings: There is a moderate volume of extra-axial blood overlying the cerebral hemispheres bilaterally.  This appears to be subarachnoid in position, and is adjacent to the medial aspects of the frontal lobes bilaterally, along the undersurface of the frontal lobes bilaterally (left greater than right, anterior surface of the left temporal lobe, and tracking along the right side of the tentorium cerebelli.  There is also small amount of intra ventricular blood best seen within the cerebral  aqueduct and fourth ventricle.  Mild rounding of the right ventricle and temporal horns of the lateral ventricles bilaterally suggests some mild hydrocephalus.  No definite intraparenchymal hemorrhage is identified within the cerebrum or cerebellum.  No definite mass. No definite signs of acute/subacute cerebral ischemia.  No acute displaced skull fractures are identified.  Visualized paranasal sinuses and mastoids are well pneumatized.  IMPRESSION: Moderate volume of subarachnoid hemorrhage overlying the cerebral hemispheres bilaterally, as well as small volume of intraventricular blood within the cerebral aqueduct and fourth ventricle, with evidence of early obstructive hydrocephalus.  This is presumably related to rupture of an aneurysm, although the epicenter of this bleeding is unclear, it is favored to be in the region of the left ACA/MCA territories.  Consultation with neurosurgery is recommended.  Correlation with a head CTA or MRA may be warranted.  Critical Value/emergent results were called by telephone at the time of interpretation on 02/18/2013 at 12:15 a.m. to Dr. Nicanor Alcon- Rasch, who verbally acknowledged these results.   Original Report Authenticated By: Trudie Reed, M.D.      CRITICAL CARE Performed by: Jasmine Awe Total critical care time: 60 minutes Critical care time was exclusive of separately billable procedures and treating other patients. Critical care was necessary to treat or prevent  imminent or life-threatening deterioration. Critical care was time spent personally by me on the following activities: development of treatment plan with patient and/or surrogate as well as nursing, discussions with consultants, evaluation of patient's response to treatment, examination of patient, obtaining history from patient or surrogate, ordering and performing treatments and interventions, ordering and review of laboratory studies, ordering and review of radiographic studies, pulse  oximetry and re-evaluation of patient's condition.   Eyoel Throgmorton Smitty Cords, MD 02/18/13 0110

## 2013-02-18 NOTE — Transfer of Care (Signed)
Immediate Anesthesia Transfer of Care Note  Patient: [name]  Procedure(s) Performed: Procedure(s): RADIOLOGY WITH ANESTHESIA (N/A)  Patient Location: NICU  Anesthesia Type:General  Level of Consciousness: sedated and unresponsive  Airway & Oxygen Therapy: Patient remains intubated per anesthesia plan and Patient placed on Ventilator (see vital sign flow sheet for setting)  Post-op Assessment: Post -op Vital signs reviewed and stable  Post vital signs: Reviewed and stable  Complications: No apparent anesthesia complications

## 2013-02-18 NOTE — Anesthesia Preprocedure Evaluation (Addendum)
Anesthesia Evaluation  Patient identified by MRN, date of birth, ID band Patient awake    Reviewed: Allergy & Precautions, H&P , NPO status , Patient's Chart, lab work & pertinent test results  History of Anesthesia Complications (+) PONV and DIFFICULT AIRWAY  Airway Mallampati: II TM Distance: >3 FB Neck ROM: Full    Dental  (+) Teeth Intact and Dental Advisory Given   Pulmonary neg pulmonary ROS,  breath sounds clear to auscultation  Pulmonary exam normal       Cardiovascular hypertension, Pt. on medications + Peripheral Vascular Disease (cerebral aneurysm) Rhythm:Regular Rate:Normal     Neuro/Psych Anxiety SAH with cerebral aneurysm, for coiling today    GI/Hepatic Neg liver ROS, GERD-  Controlled,  Endo/Other  Morbid obesity  Renal/GU negative Renal ROS     Musculoskeletal   Abdominal (+) + obese,   Peds  Hematology   Anesthesia Other Findings   Reproductive/Obstetrics preg test neg 6/18                          Anesthesia Physical Anesthesia Plan  ASA: III  Anesthesia Plan: General   Post-op Pain Management:    Induction: Intravenous  Airway Management Planned: Oral ETT  Additional Equipment: Arterial line  Intra-op Plan:   Post-operative Plan: Post-operative intubation/ventilation  Informed Consent: I have reviewed the patients History and Physical, chart, labs and discussed the procedure including the risks, benefits and alternatives for the proposed anesthesia with the patient or authorized representative who has indicated his/her understanding and acceptance.   Dental advisory given  Plan Discussed with: CRNA and Surgeon  Anesthesia Plan Comments: (Plan routine monitors, A line, GETA with post op ventilation )       Anesthesia Quick Evaluation

## 2013-02-18 NOTE — Progress Notes (Signed)
Pt.'s a-line is not correlating with cuff pressures.  Paged Dr. Corliss Skains to notify him he said OK to go by cuff pressures.  SBP remaining b/t 120-140.  Also asked about po status, he stated only sips with meds until she can sit up to 30 degrees @ 1830. Will continue to monitor.

## 2013-02-19 ENCOUNTER — Encounter (HOSPITAL_COMMUNITY): Payer: Self-pay | Admitting: Interventional Radiology

## 2013-02-19 LAB — CBC WITH DIFFERENTIAL/PLATELET
Basophils Absolute: 0 10*3/uL (ref 0.0–0.1)
Basophils Relative: 0 % (ref 0–1)
Eosinophils Absolute: 0 10*3/uL (ref 0.0–0.7)
Eosinophils Relative: 0 % (ref 0–5)
HCT: 33.1 % — ABNORMAL LOW (ref 36.0–46.0)
Hemoglobin: 10.9 g/dL — ABNORMAL LOW (ref 12.0–15.0)
Lymphocytes Relative: 12 % (ref 12–46)
Lymphs Abs: 1.5 10*3/uL (ref 0.7–4.0)
MCH: 30 pg (ref 26.0–34.0)
MCHC: 32.9 g/dL (ref 30.0–36.0)
MCV: 91.2 fL (ref 78.0–100.0)
Monocytes Absolute: 0.7 10*3/uL (ref 0.1–1.0)
Monocytes Relative: 6 % (ref 3–12)
Neutro Abs: 10.1 10*3/uL — ABNORMAL HIGH (ref 1.7–7.7)
Neutrophils Relative %: 82 % — ABNORMAL HIGH (ref 43–77)
Platelets: 257 10*3/uL (ref 150–400)
RBC: 3.63 MIL/uL — ABNORMAL LOW (ref 3.87–5.11)
RDW: 12.5 % (ref 11.5–15.5)
WBC: 12.3 10*3/uL — ABNORMAL HIGH (ref 4.0–10.5)

## 2013-02-19 LAB — BASIC METABOLIC PANEL
BUN: 12 mg/dL (ref 6–23)
CO2: 22 mEq/L (ref 19–32)
Calcium: 7.7 mg/dL — ABNORMAL LOW (ref 8.4–10.5)
Chloride: 103 mEq/L (ref 96–112)
Creatinine, Ser: 0.9 mg/dL (ref 0.50–1.10)
GFR calc Af Amer: >90 mL/min (ref >90)
GFR calc non Af Amer: 80 mL/min — ABNORMAL LOW (ref >90)
Glucose, Bld: 122 mg/dL — ABNORMAL HIGH (ref 70–99)
Potassium: 3.4 mEq/L — ABNORMAL LOW (ref 3.5–5.1)
Sodium: 135 mEq/L (ref 135–145)

## 2013-02-19 MED ORDER — SODIUM CHLORIDE 0.9 % IV SOLN
INTRAVENOUS | Status: DC
  Administered 2013-02-19 – 2013-02-24 (×11): via INTRAVENOUS

## 2013-02-19 MED ORDER — ZOLPIDEM TARTRATE 5 MG PO TABS
5.0000 mg | ORAL_TABLET | Freq: Once | ORAL | Status: AC
  Administered 2013-02-19: 5 mg via ORAL
  Filled 2013-02-19: qty 1

## 2013-02-19 MED ORDER — PANTOPRAZOLE SODIUM 40 MG PO TBEC
40.0000 mg | DELAYED_RELEASE_TABLET | Freq: Every day | ORAL | Status: DC
  Administered 2013-02-19 – 2013-03-02 (×12): 40 mg via ORAL
  Filled 2013-02-19 (×13): qty 1

## 2013-02-19 NOTE — Progress Notes (Signed)
1 Day Post-Op  Subjective: SAH/ ACOM aneurysm coiling 6/19 Stable resting Minimal headache- front middle head and travels back to neck Minimal nausea at times- no vomit  Objective: Vital signs in last 24 hours: Temp:  [98.1 F (36.7 C)-98.4 F (36.9 C)] 98.2 F (36.8 C) (06/20 0400) Pulse Rate:  [43-83] 48 (06/20 0700) Resp:  [10-23] 19 (06/20 0700) BP: (100-149)/(42-96) 126/78 mmHg (06/20 0700) SpO2:  [93 %-100 %] 96 % (06/20 0700) Arterial Line BP: (143-169)/(67-88) 161/79 mmHg (06/20 0700) FiO2 (%):  [40 %] 40 % (06/19 1309) Last BM Date:  (pta)  Intake/Output from previous day: 06/19 0701 - 06/20 0700 In: 2356.3 [P.O.:150; I.V.:2156.3; IV Piggyback:50] Out: 2275 [Urine:2275] Intake/Output this shift:    PE:  Afeb; vss A/O Appropriate Smile = Tongue midline; puffs cheeks = Good strength and movement all extr Moves all 4s Follows all commands Rt groin NT; no bleeding; no hematoma Rt foot 2+ pulses H/H sl down: 10.9/33 (13.9/40) Bun/Cr stable   Lab Results:   Recent Labs  02/18/13 0026 02/19/13 0500  WBC 17.0* 12.3*  HGB 13.9 10.9*  HCT 40.3 33.1*  PLT 308 257   BMET  Recent Labs  02/18/13 0026 02/19/13 0500  NA 133* 135  K 3.4* 3.4*  CL 93* 103  CO2 24 22  GLUCOSE 191* 122*  BUN 9 12  CREATININE 0.80 0.90  CALCIUM 8.9 7.7*   PT/INR  Recent Labs  02/18/13 0026  LABPROT 12.6  INR 0.95   ABG No results found for this basename: PHART, PCO2, PO2, HCO3,  in the last 72 hours  Studies/Results: Ct Angio Head W/cm &/or Wo Cm  02/18/2013   *RADIOLOGY REPORT*  Clinical Data:  Subarachnoid hemorrhage.  CT ANGIOGRAPHY HEAD  Technique:  Multidetector CT imaging of the head was performed using the standard protocol during bolus administration of intravenous contrast.  Multiplanar CT image reconstructions including MIPs were obtained to evaluate the vascular anatomy.  Contrast: 50mL OMNIPAQUE IOHEXOL 350 MG/ML SOLN  Comparison:  02/18/2013 head CT.   Findings:  5 x 4 x 3 mm aneurysm left aspect of the anterior communicating artery region.  Tiny bulge at the right aspect of the basilar tip.  1 mm aneurysm at this level not excluded.  It is possible this appearance is related to the fetal type origin of the right posterior cerebral artery.  No other aneurysm is noted.  Subarachnoid blood  asymmetric greater on the left and most notable surrounding the left anterior communicating artery region.  Increase in amount of intraventricular blood.  Hydrocephalus similar to the prior exam.   Review of the MIP images confirms the above findings.  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  Tiny bulge at the right aspect of the basilar tip.  1 mm aneurysm at this level not excluded.  It is possible this appearance is related to the fetal type origin of the right posterior cerebral artery.  No other aneurysm is noted.  Subarachnoid blood  asymmetric greater on the left and most notable surrounding the left anterior communicating artery region.  Increase in amount of intraventricular blood.  Hydrocephalus similar to the prior exam.  Preliminary report at the time imaging by Dr. Llana Aliment.  Report made available to Dr. Corliss Skains prior to catheter angiogram.   Original Report Authenticated By: Lacy Duverney, M.D.   Ct Head Wo Contrast  02/18/2013   *RADIOLOGY REPORT*  Clinical Data: Follow-up aneurysm coiling.  Recent subarachnoid hemorrhage.  CT HEAD WITHOUT CONTRAST  Technique:  Contiguous axial images were obtained from the base of the skull through the vertex without contrast.  Comparison: CT 04/07/1913.  CT head 02/18/2013.  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  The coil mass appears to be well located within the aneurysm without extension into the surrounding vasculature.  No new subarachnoid hemorrhage is present.  There is no hydrocephalus.  No  visible acute cerebral infarction.  Slight intraventricular blood.  IMPRESSION: Satisfactory appearance status post anterior communicating artery aneurysm coiling.   Original Report Authenticated By: Davonna Belling, M.D.   Ct Head Wo Contrast  02/18/2013   *RADIOLOGY REPORT*  Clinical Data: Severe headache.  Nausea and vomiting.  Bilateral leg weakness.  CT HEAD WITHOUT CONTRAST  Technique:  Contiguous axial images were obtained from the base of the skull through the vertex without contrast.  Comparison: No priors.  Findings: There is a moderate volume of extra-axial blood overlying the cerebral hemispheres bilaterally.  This appears to be subarachnoid in position, and is adjacent to the medial aspects of the frontal lobes bilaterally, along the undersurface of the frontal lobes bilaterally (left greater than right, anterior surface of the left temporal lobe, and tracking along the right side of the tentorium cerebelli.  There is also small amount of intra ventricular blood best seen within the cerebral aqueduct and fourth ventricle.  Mild rounding of the right ventricle and temporal horns of the lateral ventricles bilaterally suggests some mild hydrocephalus.  No definite intraparenchymal hemorrhage is identified within the cerebrum or cerebellum.  No definite mass. No definite signs of acute/subacute cerebral ischemia.  No acute displaced skull fractures are identified.  Visualized paranasal sinuses and mastoids are well pneumatized.  IMPRESSION: Moderate volume of subarachnoid hemorrhage overlying the cerebral hemispheres bilaterally, as well as small volume of intraventricular blood within the cerebral aqueduct and fourth ventricle, with evidence of early obstructive hydrocephalus.  This is presumably related to rupture of an aneurysm, although the epicenter of this bleeding is unclear, it is favored to be in the region of the left ACA/MCA territories.  Consultation with neurosurgery is recommended.  Correlation  with a head CTA or MRA may be warranted.  Critical Value/emergent results were called by telephone at the time of interpretation on 02/18/2013 at 12:15 a.m. to Dr. Nicanor Alcon- Rasch, who verbally acknowledged these results.   Original Report Authenticated By: Trudie Reed, M.D.    Anti-infectives: Anti-infectives   Start     Dose/Rate Route Frequency Ordered Stop   02/18/13 1100  ceFAZolin (ANCEF) IVPB 2 g/50 mL premix     2 g 100 mL/hr over 30 Minutes Intravenous  Once 02/18/13 1057 02/18/13 1517      Assessment/Plan: s/p Procedure(s): RADIOLOGY WITH ANESTHESIA (N/A)  ACOM aneurysm rupture Coiling 6/19 with Dr Corliss Skains Doing well Sl headache Can advance diet if ok with Dr Franky Macho Dc foley   LOS: 2 days    Dyron Kawano A 02/19/2013

## 2013-02-19 NOTE — Progress Notes (Signed)
Patient ID: [name], female   DOB: [birth year], 42 y.o.   MRN: [id number] Patient doing well with stable headache and minimal nausea.  Awake alert oriented  Strength 5/5 no PD  Cont observation PCD1 Cont hydration.

## 2013-02-19 NOTE — Anesthesia Postprocedure Evaluation (Signed)
  Anesthesia Post-op Note  Patient: [name]  Procedure(s) Performed: Procedure(s): RADIOLOGY WITH ANESTHESIA (N/A)  Patient Location: ICU  Anesthesia Type:General  Level of Consciousness: Patient remains intubated per anesthesia plan  Airway and Oxygen Therapy: Patient remains intubated per anesthesia plan and Patient placed on Ventilator (see vital sign flow sheet for setting)  Post-op Pain: none  Post-op Assessment: Post-op Vital signs reviewed, Patient's Cardiovascular Status Stable, Respiratory Function Stable, Patent Airway, No signs of Nausea or vomiting and Pain level controlled  Post-op Vital Signs: Reviewed and stable  Complications: No apparent anesthesia complications

## 2013-02-19 NOTE — Progress Notes (Signed)
Physical Therapy Evaluation Patient Details Name: [name] MRN: [id number] DOB: 1971-01-19 Today's Date: 02/19/2013 Time: 1135-1203 PT Time Calculation (min): 28 min  PT Assessment / Plan / Recommendation Clinical Impression  PT s/p aneurysm coiling with overall good mobility.  Will benefit from PT to address improvement in activity tolerance and ensure mobility as well as work on balance.  Should progress to home without therapy but at worst case scenario, will need Outpt PT.      PT Assessment  Patient needs continued PT services    Follow Up Recommendations  Outpatient PT;Supervision - Intermittent                Equipment Recommendations  None recommended by PT         Frequency Min 3X/week    Precautions / Restrictions Precautions Precautions: Fall Restrictions Weight Bearing Restrictions: No   Pertinent Vitals/Pain VSS, HA pain of which she got pain meds for, BP after transfer was 165/90 and nursing aware.       Mobility  Bed Mobility Bed Mobility: Rolling Left;Left Sidelying to Sit Rolling Left: 5: Supervision Left Sidelying to Sit: 5: Supervision Details for Bed Mobility Assistance: Incr time but pt able to get to EOB by herself Transfers Transfers: Sit to Stand;Stand to Sit Sit to Stand: 4: Min guard;With upper extremity assist;From bed Stand to Sit: 4: Min guard;With upper extremity assist;To chair/3-in-1;With armrests Details for Transfer Assistance: Pt needed cues for hand placement and for anterior lean.   Ambulation/Gait Ambulation/Gait Assistance: 4: Min assist Ambulation Distance (Feet): 4 Feet Assistive device: Rolling walker Ambulation/Gait Assistance Details: Pt able to ambulate a few feet with RW with fair balance.  No LOB but very slow moving.   Gait Pattern: Step-to pattern;Decreased stride length;Decreased step length - right;Decreased step length - left Gait velocity: decreased Stairs: No Wheelchair Mobility Wheelchair  Mobility: No         PT Diagnosis: Generalized weakness;Acute pain  PT Problem List: Decreased activity tolerance;Decreased balance;Decreased mobility;Decreased knowledge of use of DME;Decreased safety awareness;Decreased knowledge of precautions;Pain PT Treatment Interventions: DME instruction;Gait training;Stair training;Functional mobility training;Therapeutic activities;Therapeutic exercise;Balance training;Patient/family education   PT Goals Acute Rehab PT Goals PT Goal Formulation: With patient Time For Goal Achievement: 02/26/13 Potential to Achieve Goals: Good Pt will go Supine/Side to Sit: Independently PT Goal: Supine/Side to Sit - Progress: Goal set today Pt will go Sit to Stand: Independently PT Goal: Sit to Stand - Progress: Goal set today Pt will Transfer Bed to Chair/Chair to Bed: Independently PT Transfer Goal: Bed to Chair/Chair to Bed - Progress: Goal set today Pt will Ambulate: >150 feet;with modified independence;with least restrictive assistive device PT Goal: Ambulate - Progress: Goal set today Pt will Go Up / Down Stairs: 3-5 stairs;with modified independence;with least restrictive assistive device PT Goal: Up/Down Stairs - Progress: Goal set today Pt will Perform Home Exercise Program: Independently PT Goal: Perform Home Exercise Program - Progress: Goal set today  Visit Information  Last PT Received On: 02/19/13 Assistance Needed: +1    Subjective Data  Subjective: "I just can't stand the light and I need my anxiety medication." Patient Stated Goal: to go home   Prior Functioning  Home Living Lives With: Spouse Available Help at Discharge: Family;Available 24 hours/day (sister can stay with her, husband works) Type of Home: House Home Access: Stairs to enter Secretary/administrator of Steps: 3 Entrance Stairs-Rails: None Home Layout: One level Bathroom Shower/Tub: Naval architect Equipment: None Prior Function Level  of Independence:  Independent Able to Take Stairs?: Yes Driving: Yes Vocation: Full time employment Acupuncturist) Communication Communication: No difficulties    Cognition  Cognition Arousal/Alertness: Awake/alert Behavior During Therapy: WFL for tasks assessed/performed Overall Cognitive Status: Within Functional Limits for tasks assessed    Extremity/Trunk Assessment Right Lower Extremity Assessment RLE ROM/Strength/Tone: [facility] for tasks assessed Left Lower Extremity Assessment LLE ROM/Strength/Tone: WFL for tasks assessed   Balance Static Standing Balance Static Standing - Balance Support: Bilateral upper extremity supported;During functional activity Static Standing - Level of Assistance: 4: Min assist Static Standing - Comment/# of Minutes: 3  End of Session PT - End of Session Equipment Utilized During Treatment: Gait belt Activity Tolerance: Patient limited by fatigue Patient left: in chair;with call bell/phone within reach Nurse Communication: Mobility status       INGOLD,Ocie Stanzione 02/19/2013, 3:30 PM [facility] [phone] (pager)

## 2013-02-20 LAB — URINALYSIS, ROUTINE W REFLEX MICROSCOPIC
Bilirubin Urine: NEGATIVE
Glucose, UA: NEGATIVE mg/dL
Hgb urine dipstick: NEGATIVE
Ketones, ur: NEGATIVE mg/dL
Leukocytes, UA: NEGATIVE
Nitrite: NEGATIVE
Protein, ur: NEGATIVE mg/dL
Specific Gravity, Urine: 1.007 (ref 1.005–1.030)
Urobilinogen, UA: 0.2 mg/dL (ref 0.0–1.0)
pH: 7.5 (ref 5.0–8.0)

## 2013-02-20 MED ORDER — HYDROMORPHONE HCL 2 MG PO TABS: 4.0000 mg | ORAL_TABLET | ORAL | Status: DC | PRN

## 2013-02-20 MED ORDER — BISACODYL 5 MG PO TBEC
5.0000 mg | DELAYED_RELEASE_TABLET | Freq: Every day | ORAL | Status: DC | PRN
  Administered 2013-02-24 (×2): 5 mg via ORAL
  Filled 2013-02-20 (×4): qty 1

## 2013-02-20 MED ORDER — PHENAZOPYRIDINE HCL 100 MG PO TABS
100.0000 mg | ORAL_TABLET | Freq: Three times a day (TID) | ORAL | Status: AC
  Administered 2013-02-20 – 2013-02-22 (×6): 100 mg via ORAL
  Filled 2013-02-20 (×6): qty 1

## 2013-02-20 MED ORDER — ACETAMINOPHEN 10 MG/ML IV SOLN
1000.0000 mg | Freq: Four times a day (QID) | INTRAVENOUS | Status: AC
  Administered 2013-02-20 (×4): 1000 mg via INTRAVENOUS
  Filled 2013-02-20 (×4): qty 100

## 2013-02-20 MED ORDER — DEXAMETHASONE SODIUM PHOSPHATE 4 MG/ML IJ SOLN
4.0000 mg | Freq: Four times a day (QID) | INTRAMUSCULAR | Status: AC
  Administered 2013-02-20 (×4): 4 mg via INTRAVENOUS
  Filled 2013-02-20 (×4): qty 1

## 2013-02-20 MED ORDER — PHENAZOPYRIDINE HCL 100 MG PO TABS: 100.0000 mg | ORAL_TABLET | Freq: Three times a day (TID) | ORAL | Status: DC

## 2013-02-20 MED ORDER — HYDROMORPHONE HCL 2 MG PO TABS
2.0000 mg | ORAL_TABLET | ORAL | Status: DC | PRN
  Administered 2013-02-20 – 2013-02-21 (×8): 2 mg via ORAL
  Administered 2013-02-22 (×3): 4 mg via ORAL
  Administered 2013-02-22: 2 mg via ORAL
  Administered 2013-02-22: 4 mg via ORAL
  Administered 2013-02-23: 2 mg via ORAL
  Administered 2013-02-23: 4 mg via ORAL
  Administered 2013-02-23 (×4): 2 mg via ORAL
  Administered 2013-02-24 (×2): 4 mg via ORAL
  Administered 2013-02-24 (×2): 2 mg via ORAL
  Administered 2013-02-24: 4 mg via ORAL
  Administered 2013-02-24: 2 mg via ORAL
  Administered 2013-02-25 – 2013-02-26 (×7): 4 mg via ORAL
  Administered 2013-02-26: 2 mg via ORAL
  Administered 2013-02-26 – 2013-03-01 (×17): 4 mg via ORAL
  Administered 2013-03-02 (×2): 2 mg via ORAL
  Administered 2013-03-02: 4 mg via ORAL
  Administered 2013-03-02 – 2013-03-03 (×6): 2 mg via ORAL
  Filled 2013-02-20: qty 1
  Filled 2013-02-20: qty 2
  Filled 2013-02-20 (×2): qty 1
  Filled 2013-02-20: qty 2
  Filled 2013-02-20 (×2): qty 1
  Filled 2013-02-20: qty 2
  Filled 2013-02-20 (×2): qty 1
  Filled 2013-02-20: qty 4
  Filled 2013-02-20 (×5): qty 2
  Filled 2013-02-20 (×3): qty 1
  Filled 2013-02-20 (×3): qty 2
  Filled 2013-02-20: qty 1
  Filled 2013-02-20 (×2): qty 2
  Filled 2013-02-20: qty 1
  Filled 2013-02-20: qty 2
  Filled 2013-02-20 (×3): qty 1
  Filled 2013-02-20 (×7): qty 2
  Filled 2013-02-20: qty 1
  Filled 2013-02-20 (×6): qty 2
  Filled 2013-02-20: qty 1
  Filled 2013-02-20 (×2): qty 2
  Filled 2013-02-20 (×4): qty 1
  Filled 2013-02-20: qty 2
  Filled 2013-02-20: qty 1
  Filled 2013-02-20: qty 2
  Filled 2013-02-20: qty 1
  Filled 2013-02-20 (×4): qty 2

## 2013-02-20 MED ORDER — ALUM & MAG HYDROXIDE-SIMETH 200-200-20 MG/5ML PO SUSP
30.0000 mL | ORAL | Status: DC | PRN
  Administered 2013-02-20: 30 mL via ORAL
  Filled 2013-02-20: qty 30

## 2013-02-20 NOTE — Progress Notes (Signed)
Patient ID: [name], female   DOB: 05/09/1971, 42 y.o.   MRN: [id number] Subjective: Patient reports continued  moderate headache that waxes and wanes. Aching in character. Not progressive. No numbness tingling or weakness or nausea and vomiting or visual changes. Medications seem to help.  Objective: Vital signs in last 24 hours: Temp:  [97.9 F (36.6 C)-99.2 F (37.3 C)] 97.9 F (36.6 C) (06/21 0400) Pulse Rate:  [42-58] 55 (06/21 0600) Resp:  [16-23] 22 (06/21 0200) BP: (120-164)/(70-98) 140/91 mmHg (06/21 0600) SpO2:  [92 %-98 %] 95 % (06/21 0600) Arterial Line BP: (120-169)/(79-106) 120/106 mmHg (06/20 1230)  Intake/Output from previous day: 06/20 0701 - 06/21 0700 In: 2483.3 [P.O.:410; I.V.:2073.3] Out: 330 [Urine:330] Intake/Output this shift:    Neurologic: Grossly normal, awake and alert, conversant, no pronator drift, as all extremities equally and has good sensation throughout, no aphasia  Lab Results: Lab Results  Component Value Date   WBC 12.3* 02/19/2013   HGB 10.9* 02/19/2013   HCT 33.1* 02/19/2013   MCV 91.2 02/19/2013   PLT 257 02/19/2013   Lab Results  Component Value Date   INR 0.95 02/18/2013   BMET Lab Results  Component Value Date   NA 135 02/19/2013   K 3.4* 02/19/2013   CL 103 02/19/2013   CO2 22 02/19/2013   GLUCOSE 122* 02/19/2013   BUN 12 02/19/2013   CREATININE 0.90 02/19/2013   CALCIUM 7.7* 02/19/2013    Studies/Results: Ct Head Wo Contrast  02/18/2013   *RADIOLOGY REPORT*  Clinical Data: Follow-up aneurysm coiling.  Recent subarachnoid hemorrhage.  CT HEAD WITHOUT CONTRAST  Technique:  Contiguous axial images were obtained from the base of the skull through the vertex without contrast.  Comparison: CT 04/07/1913.  CT head 02/18/2013.  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  The coil mass appears to be well located within the aneurysm without extension into the  surrounding vasculature.  No new subarachnoid hemorrhage is present.  There is no hydrocephalus.  No visible acute cerebral infarction.  Slight intraventricular blood.  IMPRESSION: Satisfactory appearance status post anterior communicating artery aneurysm coiling.   Original Report Authenticated By: Davonna Belling, M.D.    Assessment/Plan: Doing well other than pain control because of headaches. Neurologically stable. Sodium was good yesterday.   LOS: 3 days    Vamsi Apfel S 02/20/2013, 7:10 AM

## 2013-02-20 NOTE — Progress Notes (Signed)
2 Days Post-Op  Subjective: SAH; ACOM aneurysm rupture Coiling 6/19 Up in chair Ambulating in room to bathroom Still headache; frontal head to back of neck Not sleeping well  Objective: Vital signs in last 24 hours: Temp:  [97.9 F (36.6 C)-99.2 F (37.3 C)] 97.9 F (36.6 C) (06/21 0400) Pulse Rate:  [42-58] 50 (06/21 0822) Resp:  [16-23] 19 (06/21 0822) BP: (120-169)/(70-98) 169/92 mmHg (06/21 0822) SpO2:  [92 %-98 %] 96 % (06/21 0822) Arterial Line BP: (120-169)/(82-106) 120/106 mmHg (06/20 1230) Last BM Date:  (pta)  Intake/Output from previous day: 06/20 0701 - 06/21 0700 In: 2583.3 [P.O.:410; I.V.:2173.3] Out: 330 [Urine:330] Intake/Output this shift: Total I/O In: 100 [I.V.:100] Out: -   PE:  Afeb; vss A/O; appropriate Seems bothered with lack of sleep and headache Moves all 4s Neuro intact  Lab Results:   Recent Labs  02/18/13 0026 02/19/13 0500  WBC 17.0* 12.3*  HGB 13.9 10.9*  HCT 40.3 33.1*  PLT 308 257   BMET  Recent Labs  02/18/13 0026 02/19/13 0500  NA 133* 135  K 3.4* 3.4*  CL 93* 103  CO2 24 22  GLUCOSE 191* 122*  BUN 9 12  CREATININE 0.80 0.90  CALCIUM 8.9 7.7*   PT/INR  Recent Labs  02/18/13 0026  LABPROT 12.6  INR 0.95   ABG No results found for this basename: PHART, PCO2, PO2, HCO3,  in the last 72 hours  Studies/Results: Ct Head Wo Contrast  02/18/2013   *RADIOLOGY REPORT*  Clinical Data: Follow-up aneurysm coiling.  Recent subarachnoid hemorrhage.  CT HEAD WITHOUT CONTRAST  Technique:  Contiguous axial images were obtained from the base of the skull through the vertex without contrast.  Comparison: CT 04/07/1913.  CT head 02/18/2013.  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  The coil mass appears to be well located within the aneurysm without extension into the surrounding vasculature.  No new subarachnoid hemorrhage is present.  There is no  hydrocephalus.  No visible acute cerebral infarction.  Slight intraventricular blood.  IMPRESSION: Satisfactory appearance status post anterior communicating artery aneurysm coiling.   Original Report Authenticated By: Davonna Belling, M.D.    Anti-infectives: Anti-infectives   Start     Dose/Rate Route Frequency Ordered Stop   02/18/13 1100  ceFAZolin (ANCEF) IVPB 2 g/50 mL premix     2 g 100 mL/hr over 30 Minutes Intravenous  Once 02/18/13 1057 02/18/13 1517      Assessment/Plan: s/p Procedure(s): RADIOLOGY WITH ANESTHESIA (N/A)  ACOM aneurysm rupture Coiling 6/19 Headache biggest complaint  And can't sleep Will report to Dr Corliss Skains   LOS: 3 days    Etoy Mcdonnell A 02/20/2013

## 2013-02-21 NOTE — Progress Notes (Signed)
Patient ID: [name], female   DOB: [birth year], 42 y.o.   MRN: [id number] Subjective: Patient reports improvement compared to yesterday. She does feel some anxiety but states she gets this whenever she has her menstrual cycle which she started today. Denies numbness tingling or weakness or visual changes. Some achy mild headache. No nausea and vomiting.  Objective: Vital signs in last 24 hours: Temp:  [97.7 F (36.5 C)-99 F (37.2 C)] 99 F (37.2 C) (06/22 0000) Pulse Rate:  [42-68] 44 (06/22 0600) Resp:  [16-27] 21 (06/22 0600) BP: (103-169)/(58-94) 148/88 mmHg (06/22 0600) SpO2:  [90 %-100 %] 97 % (06/22 0600)  Intake/Output from previous day: 06/21 0701 - 06/22 0700 In: 3390 [P.O.:1090; I.V.:2100; IV Piggyback:200] Out: -  Intake/Output this shift:    Neurologic: Grossly normal, awake and alert and conversant without aphasia, EOMI, no facial asymmetry, no pronator drift, moves all extremities equally, sensation okay Resp unlabored Heart regular rate and rhythm  Lab Results: Lab Results  Component Value Date   WBC 12.3* 02/19/2013   HGB 10.9* 02/19/2013   HCT 33.1* 02/19/2013   MCV 91.2 02/19/2013   PLT 257 02/19/2013   Lab Results  Component Value Date   INR 0.95 02/18/2013   BMET Lab Results  Component Value Date   NA 135 02/19/2013   K 3.4* 02/19/2013   CL 103 02/19/2013   CO2 22 02/19/2013   GLUCOSE 122* 02/19/2013   BUN 12 02/19/2013   CREATININE 0.90 02/19/2013   CALCIUM 7.7* 02/19/2013    Studies/Results: No results found.  Assessment/Plan: Patient stable to slightly improved in her pain control. Neurologic exam normal. Continue current management.   LOS: 4 days    Aleyza Salmi S 02/21/2013, 7:01 AM

## 2013-02-21 NOTE — Progress Notes (Signed)
3 Days Post-Op  Subjective: SAH/ ACOM rupture Coiling 6/19 Better today Slept better last night Still with frontal h/a- to back of neck No N/V  Objective: Vital signs in last 24 hours: Temp:  [97.7 F (36.5 C)-99 F (37.2 C)] 98.4 F (36.9 C) (06/22 0800) Pulse Rate:  [42-68] 68 (06/22 0700) Resp:  [16-27] 23 (06/22 0700) BP: (103-158)/(58-94) 126/77 mmHg (06/22 0800) SpO2:  [90 %-100 %] 98 % (06/22 0700) Last BM Date:  (pta)  Intake/Output from previous day: 06/21 0701 - 06/22 0700 In: 3590 [P.O.:1090; I.V.:2300; IV Piggyback:200] Out: -  Intake/Output this shift:    PE: afeb; vss Neuro intact Resting with washcloth over eyes   Lab Results:   Recent Labs  02/19/13 0500  WBC 12.3*  HGB 10.9*  HCT 33.1*  PLT 257   BMET  Recent Labs  02/19/13 0500  NA 135  K 3.4*  CL 103  CO2 22  GLUCOSE 122*  BUN 12  CREATININE 0.90  CALCIUM 7.7*   PT/INR No results found for this basename: LABPROT, INR,  in the last 72 hours ABG No results found for this basename: PHART, PCO2, PO2, HCO3,  in the last 72 hours  Studies/Results: No results found.  Anti-infectives: Anti-infectives   Start     Dose/Rate Route Frequency Ordered Stop   02/18/13 1100  ceFAZolin (ANCEF) IVPB 2 g/50 mL premix     2 g 100 mL/hr over 30 Minutes Intravenous  Once 02/18/13 1057 02/18/13 1517      Assessment/Plan: s/p Procedure(s): RADIOLOGY WITH ANESTHESIA (N/A)  ACOM coiling 6/19 Will report to [facility]   LOS: 4 days    Cedricka Sackrider A 02/21/2013

## 2013-02-22 LAB — TYPE AND SCREEN
ABO/RH(D): O POS
Antibody Screen: NEGATIVE
Unit division: 0
Unit division: 0

## 2013-02-22 NOTE — Progress Notes (Signed)
Patient ID: [name], female   DOB: 1971/08/28, 42 y.o.   MRN: [id number] BP 112/59  Pulse 55  Temp(Src) 98.8 F (37.1 C) (Oral)  Resp 19  Ht 5\' 6"  (1.676 m)  Wt 87.6 kg (193 lb 2 oz)  BMI 31.19 kg/m2  SpO2 97%  LMP 01/28/2013 Alert and oriented x 4 Speech is clear and fluent.  Moving all extremities well,  Perrl, full eom.  Has A persistent headache. Will check tomorrow. May need an LP if headache does not improve.

## 2013-02-22 NOTE — Progress Notes (Signed)
Physical Therapy Treatment Patient Details Name: [name] MRN: [id number] DOB: April 23, 1971 Today's Date: 02/22/2013 Time: 1478-2956 PT Time Calculation (min): 25 min  PT Assessment / Plan / Recommendation Comments on Treatment Session  Patient progressing with mobility.  States walked unit last evening.  Encouraged at least 2 walks a day and informed PT would continue to work on high level balance.  Feel she will benefit from outpatient PT upon d/c as pt normally very active and hopes to return to normal lifestyle.     Follow Up Recommendations  Outpatient PT;Supervision - Intermittent     Does the patient have the potential to tolerate intense rehabilitation   N/A  Barriers to Discharge  None      Equipment Recommendations  None recommended by PT    Recommendations for Other Services  None  Frequency Min 3X/week   Plan Discharge plan remains appropriate    Precautions / Restrictions Precautions Precautions: Fall   Pertinent Vitals/Pain 4-6/10 headache progressing with mobility    Mobility  Bed Mobility Bed Mobility: Supine to Sit;Sit to Supine Supine to Sit: 6: Modified independent (Device/Increase time) Sit to Supine: 6: Modified independent (Device/Increase time) Transfers Sit to Stand: 5: Supervision;From bed Stand to Sit: 5: Supervision;To bed Details for Transfer Assistance: slowed and stiff due to hip stiffness Ambulation/Gait Ambulation/Gait Assistance: 5: Supervision Ambulation Distance (Feet): 150 Feet Assistive device: None Ambulation/Gait Assistance Details: widened base with shorter step length, loss of balance x 1 with min assist recovery Gait Pattern: Wide base of support;Decreased stride length      PT Goals Acute Rehab PT Goals Pt will go Supine/Side to Sit: Independently PT Goal: Supine/Side to Sit - Progress: Met Pt will go Sit to Stand: Independently PT Goal: Sit to Stand - Progress: Met Pt will Transfer Bed to Chair/Chair to Bed:  Independently PT Transfer Goal: Bed to Chair/Chair to Bed - Progress: Progressing toward goal Pt will Ambulate: >150 feet;with modified independence;with least restrictive assistive device PT Goal: Ambulate - Progress: Progressing toward goal Pt will Go Up / Down Stairs: 3-5 stairs;with modified independence;with least restrictive assistive device PT Goal: Up/Down Stairs - Progress: Progressing toward goal  Visit Information  Last PT Received On: 02/22/13    Subjective Data  Subjective: I feel I could do a whole lot better if I could get some sleep.   Cognition  Cognition Arousal/Alertness: Awake/alert Behavior During Therapy: WFL for tasks assessed/performed Overall Cognitive Status: Within Functional Limits for tasks assessed    Balance  Standardized Balance Assessment Standardized Balance Assessment: Berg Balance Test Berg Balance Test Sit to Stand: Able to stand without using hands and stabilize independently Standing Unsupported: Able to stand safely 2 minutes Sitting with Back Unsupported but Feet Supported on Floor or Stool: Able to sit safely and securely 2 minutes Stand to Sit: Sits safely with minimal use of hands Transfers: Able to transfer safely, minor use of hands Standing Unsupported with Eyes Closed: Able to stand 10 seconds safely Standing Ubsupported with Feet Together: Able to place feet together independently and stand 1 minute safely From Standing, Reach Forward with Outstretched Arm: Can reach confidently >25 cm (10") From Standing Position, Pick up Object from Floor: Able to pick up shoe, needs supervision From Standing Position, Turn to Look Behind Over each Shoulder: Looks behind from both sides and weight shifts well Turn 360 Degrees: Able to turn 360 degrees safely but slowly Standing Unsupported, Alternately Place Feet on Step/Stool: Able to complete >2 steps/needs minimal assist  Standing Unsupported, One Foot in Front: Able to place foot tandem  independently and hold 30 seconds Standing on One Leg: Able to lift leg independently and hold 5-10 seconds Total Score: 49  End of Session PT - End of Session Equipment Utilized During Treatment: Gait belt Activity Tolerance: Patient limited by fatigue Patient left: in bed;with call bell/phone within reach   GP     [facility] 02/22/2013, 1:26 PM [place], [place] [phone] 02/22/2013

## 2013-02-23 NOTE — Progress Notes (Signed)
Patient ID: [name], female   DOB: 05-28-1971, 42 y.o.   MRN: [id number] BP 154/94  Pulse 59  Temp(Src) 98.4 F (36.9 C) (Oral)  Resp 15  Ht 5\' 6"  (1.676 m)  Wt 87.6 kg (193 lb 2 oz)  BMI 31.19 kg/m2  SpO2 98%  LMP 01/28/2013 Alert and oriented x 4 Speech is clear and fluent. Perrl, full eom Symmetric facies, tongue and uvula midline No drift 5/5 strength Headache is improved since yesterday. No need for lp at this time.

## 2013-02-23 NOTE — Progress Notes (Signed)
5 Days Post-Op  Subjective: SAH/ ACOM aneurysm rupture Coiling 6/19 Up in chair now- tearful Hips and back painful Headache- frontal mostly Minimal N- No V   Objective: Vital signs in last 24 hours: Temp:  [98.1 F (36.7 C)-99.1 F (37.3 C)] 98.7 F (37.1 C) (06/24 0400) Pulse Rate:  [50-83] 63 (06/24 0600) Resp:  [15-24] 16 (06/24 0600) BP: (111-164)/(41-140) 138/90 mmHg (06/24 0600) SpO2:  [93 %-100 %] 98 % (06/24 0600) Last BM Date:  (pta)  Intake/Output from previous day: 06/23 0701 - 06/24 0700 In: 3140 [P.O.:840; I.V.:2300] Out: 1400 [Urine:1400] Intake/Output this shift:    PE: afeb vss- one spike in BP at 400 am  Moves all 4s Neuro intact    Lab Results:  No results found for this basename: WBC, HGB, HCT, PLT,  in the last 72 hours BMET No results found for this basename: NA, K, CL, CO2, GLUCOSE, BUN, CREATININE, CALCIUM,  in the last 72 hours PT/INR No results found for this basename: LABPROT, INR,  in the last 72 hours ABG No results found for this basename: PHART, PCO2, PO2, HCO3,  in the last 72 hours  Studies/Results: No results found.  Anti-infectives: Anti-infectives   Start     Dose/Rate Route Frequency Ordered Stop   02/18/13 1100  ceFAZolin (ANCEF) IVPB 2 g/50 mL premix     2 g 100 mL/hr over 30 Minutes Intravenous  Once 02/18/13 1057 02/18/13 1517      Assessment/Plan: s/p Procedure(s): RADIOLOGY WITH ANESTHESIA (N/A)  ACOM aneurysm coiling 6/19 Still with frontal headache- sl better Back and hip pain Will report to Dr Corliss Skains   LOS: 6 days    [name] A 02/23/2013

## 2013-02-23 NOTE — Progress Notes (Signed)
Physical Therapy Treatment Patient Details Name: [name] MRN: [id number] DOB: [birth year] Today's Date: 02/23/2013 Time: 4098-1191 PT Time Calculation (min): 29 min  PT Assessment / Plan / Recommendation Comments on Treatment Session  Patient improving daily with balance, independence and even pain with improved pain after stretching,  Still recommend balance training outpatient PT due to pt very active and could benefit from continued skilled PT for high level balance.    Follow Up Recommendations  Outpatient PT;Supervision - Intermittent     Does the patient have the potential to tolerate intense rehabilitation   N/A  Barriers to Discharge  None      Equipment Recommendations  None recommended by PT    Recommendations for Other Services  None  Frequency Min 3X/week   Plan Discharge plan remains appropriate    Precautions / Restrictions Precautions Precautions: None   Pertinent Vitals/Pain Min c/o pain in hips    Mobility  Bed Mobility Supine to Sit: 6: Modified independent (Device/Increase time) Sit to Supine: 6: Modified independent (Device/Increase time) Transfers Sit to Stand: 6: Modified independent (Device/Increase time);From bed Stand to Sit: 6: Modified independent (Device/Increase time);To bed Ambulation/Gait Ambulation/Gait Assistance: 4: Min guard;5: Supervision Ambulation Distance (Feet): 150 Feet Assistive device: None Ambulation/Gait Assistance Details: broad based with one loss of balance left due to momentary pain in head per pt. min assist recovery Gait Pattern: Step-through pattern;Wide base of support;Decreased stride length    Exercises Other Exercises Other Exercises: standing heel cord stretch at wall x 1 x 20 sec hold; post pelvic tilts at wall 5 sec hold x 7 reps; seated piriformis stretch x 1 x 20-30 sec  hold; standing IT band stretch at wall 1 x 20 sec hold each leg.    PT Goals    Visit Information  Last PT Received On:  02/23/13    Subjective Data  Subjective: Feeling little better.  Pain better controlled.   Cognition  Cognition DO NOT USE:  Arousal/Alertness: Awake/alert Behavior During Therapy: WFL for tasks assessed/performed Overall Cognitive Status: Within Functional Limits for tasks assessed    Balance  High Level Balance High Level Balance Activites: Side stepping;Braiding;Other (comment) High Level Balance Comments: forward and backward tandem, crossovers with assist for trunk rotation, marching in place, heel and toe walking, ant/post rocking for limits of stability  End of Session PT - End of Session Equipment Utilized During Treatment: Gait belt Activity Tolerance: Patient tolerated treatment well Patient left: in bed;with call bell/phone within reach   GP     [facility] 02/23/2013, 4:11 PM [place], [place] [phone] 02/23/2013

## 2013-02-24 MED ORDER — MAGNESIUM CITRATE PO SOLN
1.0000 | Freq: Once | ORAL | Status: AC
  Administered 2013-02-25: 1 via ORAL
  Filled 2013-02-24 (×2): qty 296

## 2013-02-24 MED ORDER — LORAZEPAM 0.5 MG PO TABS
0.5000 mg | ORAL_TABLET | Freq: Two times a day (BID) | ORAL | Status: DC | PRN
  Administered 2013-02-26: 0.5 mg via ORAL
  Filled 2013-02-24: qty 1

## 2013-02-24 NOTE — Progress Notes (Signed)
Patient ID: [name], female   DOB: 04-22-1971, 42 y.o.   MRN: [id number]  BP 144/96  Pulse 95  Temp(Src) 98.6 F (37 C) (Oral)  Resp 17  Ht 5\' 6"  (1.676 m)  Wt 87.6 kg (193 lb 2 oz)  BMI 31.19 kg/m2  SpO2 92%  LMP 01/28/2013 Alert and oriented x 4, speech is clear and fluent Perrl, full eom Symmetric facies, tongue and uvula midline Moving all extremities well Will transfer to floor today.

## 2013-02-25 NOTE — Progress Notes (Signed)
Patient ID: [name], female   DOB: 11/23/1970, 42 y.o.   MRN: [id number] BP 140/93  Pulse 64  Temp(Src) 98.1 F (36.7 C) (Oral)  Resp 18  Ht 5\' 6"  (1.676 m)  Wt 87.6 kg (193 lb 2 oz)  BMI 31.19 kg/m2  SpO2 96%  LMP 01/28/2013 Alert and oriented x 4 Complaining of a mild headache.  Perrl, full eom No drift Symmetric smile Doing very well. Possible discharge next week.

## 2013-02-25 NOTE — Progress Notes (Signed)
Physical Therapy Treatment Patient Details Name: [name] MRN: [id number] DOB: 1971-01-30 Today's Date: 02/25/2013 Time: 4098-1191 PT Time Calculation (min): 21 min  PT Assessment / Plan / Recommendation  PT Comments   Mostly limited by pain today (headache) wanting to end session early. Patient also preoccupied with family being in her room. Patient able to explain her entire HEP. Encouraged her to continue ambulating. Continue to rec. OPPT.   Follow Up Recommendations  Outpatient PT;Supervision - Intermittent     Does the patient have the potential to tolerate intense rehabilitation     Barriers to Discharge        Equipment Recommendations       Recommendations for Other Services    Frequency Min 3X/week   Progress towards PT Goals Progress towards PT goals: Progressing toward goals  Plan      Precautions / Restrictions Precautions Precautions: Fall Restrictions Weight Bearing Restrictions: No   Pertinent Vitals/Pain Reports headache, not rated but enough to cease activity, reports RN had just provided meds   Mobility  Bed Mobility Bed Mobility: Not assessed Transfers Transfers: Sit to Stand;Stand to Sit Sit to Stand: 6: Modified independent (Device/Increase time);From bed Stand to Sit: 6: Modified independent (Device/Increase time);To bed Ambulation/Gait Ambulation/Gait Assistance: 5: Supervision Ambulation Distance (Feet): 300 Feet Ambulation/Gait Assistance Details: broad boase with short steps Gait Pattern: Decreased trunk rotation;Decreased stride length;Step-through pattern Gait velocity: decreased      PT Goals (current goals can now be found in the care plan section)    Visit Information  Last PT Received On: 02/25/13 Assistance Needed: +1    Subjective Data      Cognition  Cognition Arousal/Alertness: Awake/alert Behavior During Therapy: WFL for tasks assessed/performed Overall Cognitive Status: Within Functional Limits for tasks  assessed    Balance  High Level Balance High Level Balance Activites: Braiding High Level Balance Comments: mingaurdA for braiding with assist for trunk rotation  End of Session PT - End of Session Equipment Utilized During Treatment: Gait belt Activity Tolerance: Patient tolerated treatment well Patient left: in chair;with call bell/phone within reach;with family/visitor present   GP     [facility] HELEN 02/25/2013, 2:35 PM

## 2013-02-26 NOTE — Progress Notes (Signed)
VASCULAR LAB PRELIMINARY  PRELIMINARY  PRELIMINARY  PRELIMINARY  Transcranial Doppler  Date POD PCO2 HCT BP  MCA ACA PCA OPHT SIPH VERT Basilar  6-19- 14 SB     Right  Left   55  50   -29  -42   25  30/12   14  13    32  44   --  -20     -34    6-20- 14 SB     Right  Left   78  54   -42  -44   26  -23   13  14    50  52   -20  -38     -35    6-23- 14 SB     Right  Left   58  64   -75  -57   36  33   15  17   41  47   -23  -25     -47    6-25- 14 SB      Right  Left   46  40   -50  -27   17  -16   12  14    37/61  --   -31  -25     -33    02-23-13 VS      Right  Left   55  59   -40  -28   50  40   16  15   -  -   -49  -35   -47           Right  Left                                            Right  Left                                        MCA = Middle Cerebral Artery      OPHT = Opthalmic Artery     BASILAR = Basilar Artery   ACA = Anterior Cerebral Artery     SIPH = Carotid Siphon PCA = Posterior Cerebral Artery   VERT = Verterbral Artery                   Normal MCA = 62+\-12 ACA = 50+\-12 PCA = 42+\-23         Alexx Giambra, RVS 02/26/2013, 5:25 PM

## 2013-02-26 NOTE — Progress Notes (Signed)
Physical Therapy Treatment Patient Details Name: [name] MRN: [id number] DOB: Sep 11, 1970 Today's Date: 02/26/2013 Time: 4098-1191 PT Time Calculation (min): 30 min  PT Assessment / Plan / Recommendation  PT Comments   Pt scored 17/24 on DGI continues to have slow gait and decrease change of speed with activity.  Reviewed HEP and pt continues to c/o hips being tight.  Will continue to follow for higher balance activities.  Recommend pt to continue to ambulate and perform HEP 3-5x/day  Follow Up Recommendations  Outpatient PT;Supervision - Intermittent     Equipment Recommendations  None recommended by PT    Frequency Min 3X/week   Progress towards PT Goals Progress towards PT goals: Progressing toward goals  Plan Current plan remains appropriate    Precautions / Restrictions Precautions Precautions: Fall Restrictions Weight Bearing Restrictions: No   Pertinent Vitals/Pain C/o mild headache but does not rate    Mobility  Bed Mobility Bed Mobility: Not assessed Transfers Transfers: Sit to Stand;Stand to Sit Sit to Stand: 6: Modified independent (Device/Increase time);From bed Stand to Sit: 6: Modified independent (Device/Increase time);To bed Ambulation/Gait Ambulation/Gait Assistance: 6: Modified independent (Device/Increase time) Ambulation Distance (Feet): 300 Feet Assistive device: None Ambulation/Gait Assistance Details: slow cadence with wide BOS and decrease stride length Gait Pattern: Decreased trunk rotation;Decreased stride length;Step-through pattern Gait velocity: decreased Stairs: Yes Stairs Assistance: 6: Modified independent (Device/Increase time) Stair Management Technique: One rail Right;Forwards Number of Stairs: 3    Exercises General Exercises - Lower Extremity Hip Flexion/Marching: Strengthening;Both;10 reps Mini-Sqauts: Strengthening;5 reps Other Exercises Other Exercises: standing heel cord stretch at wall x 1 x 20 sec hold; post  pelvic tilts at wall 5 sec hold x 7 reps; seated piriformis stretch x 1 x 20-30 sec  hold; standing IT band stretch at wall 1 x 20 sec hold each leg.   PT Diagnosis:    PT Problem List:   PT Treatment Interventions:     PT Goals (current goals can now be found in the care plan section) Acute Rehab PT Goals Patient Stated Goal: to go home  Visit Information  Last PT Received On: 02/26/13 Assistance Needed: +1 History of Present Illness: s/p aneurysm coiling    Subjective Data  Subjective: "I'm doing better." Patient Stated Goal: to go home   Cognition  Cognition Arousal/Alertness: Awake/alert Behavior During Therapy: WFL for tasks assessed/performed Overall Cognitive Status: Within Functional Limits for tasks assessed    Balance  Dynamic Gait Index Level Surface: Mild Impairment Change in Gait Speed: Mild Impairment Gait with Horizontal Head Turns: Mild Impairment Gait with Vertical Head Turns: Mild Impairment Gait and Pivot Turn: Mild Impairment Step Over Obstacle: Mild Impairment Step Around Obstacles: Normal Steps: Mild Impairment Total Score: 17  End of Session PT - End of Session Equipment Utilized During Treatment: Gait belt Activity Tolerance: Patient tolerated treatment well Patient left: in chair;with call bell/phone within reach;with family/visitor present Nurse Communication: Mobility status   GP     Saranda Legrande 02/26/2013, 10:33 AM Jake Shark, PT DPT [phone]

## 2013-02-27 NOTE — Progress Notes (Signed)
Doing well.no c/o  Temp:  [97.9 F (36.6 C)-98.2 F (36.8 C)] 98.2 F (36.8 C) (06/28 0940) Pulse Rate:  [61-75] 66 (06/28 0940) Resp:  [18] 18 (06/28 0940) BP: (120-150)/(70-94) 136/94 mmHg (06/28 0940) SpO2:  [0 %-100 %] 97 % (06/28 0940)  Alert and oriented x 4   Perrl, full eom ,No drift   Plan: Pt doing well  - CMP -  ? Home early next week

## 2013-02-28 MED ORDER — MAGNESIUM CITRATE PO SOLN
1.0000 | Freq: Once | ORAL | Status: AC
  Administered 2013-02-28: 1 via ORAL
  Filled 2013-02-28: qty 296

## 2013-02-28 MED ORDER — FUROSEMIDE 20 MG PO TABS
20.0000 mg | ORAL_TABLET | Freq: Once | ORAL | Status: AC
  Administered 2013-02-28: 20 mg via ORAL
  Filled 2013-02-28 (×2): qty 1

## 2013-02-28 NOTE — Progress Notes (Signed)
Patient c/o unable to empty bladder and edema bil LEs.  Pt voided 150cc. Notified Dr. Gerlene Fee, received orders to I&O cath and to decrease IVF to [facility]. I/O cath pt without difficulty and drained 750cc of clear light ylellow urine.

## 2013-02-28 NOTE — Progress Notes (Signed)
Patient up threw out the day; encouraged to put her feet up; patient has been OOB since early am; continues with c/o lower extremity edema; denies SOB; IVF have been off since the am; patient drinking threw out the day; good appetite; Wants the Mag.Citrate this evening; will repeat the bladder scan for a residual urine; Dr. Phoebe Perch has ordered a BMET for the am.

## 2013-02-28 NOTE — Progress Notes (Signed)
Patient ID: [name], female   DOB: 09-30-1970, 42 y.o.   MRN: [id number] Subjective: Patient reports constipation  Objective: Vital signs in last 24 hours: Temp:  [97.3 F (36.3 C)-98.3 F (36.8 C)] 98.3 F (36.8 C) (06/29 0600) Pulse Rate:  [71-90] 76 (06/29 0600) Resp:  [18-20] 20 (06/29 0600) BP: (112-157)/(79-95) 140/95 mmHg (06/29 0600) SpO2:  [97 %-100 %] 98 % (06/29 0600)  Intake/Output from previous day: 06/28 0701 - 06/29 0700 In: 1320 [P.O.:1320] Out: 2450 [Urine:2450] Intake/Output this shift:    awake, alert, conversant  Lab Results: No results found for this basename: WBC, HGB, HCT, PLT,  in the last 72 hours BMET No results found for this basename: NA, K, CL, CO2, GLUCOSE, BUN, CREATININE, CALCIUM,  in the last 72 hours  Studies/Results: No results found.  Assessment/Plan: Doing well. Some leg edema. Will give one dose of diuretic. Had one episode of urinary retention. But seems better after I/O cath   LOS: 11 days  as above   Reinaldo Meeker, MD 02/28/2013, 10:17 AM

## 2013-02-28 NOTE — Progress Notes (Signed)
Bladder scan after voiding was less then 50ml; denies discomfort.  Encouraged elevation of her feet; she has been standing all day or sitting in the recliner with her feet on the floor.

## 2013-03-01 LAB — BASIC METABOLIC PANEL
BUN: 11 mg/dL (ref 6–23)
CO2: 34 mEq/L — ABNORMAL HIGH (ref 19–32)
Calcium: 8.9 mg/dL (ref 8.4–10.5)
Chloride: 97 mEq/L (ref 96–112)
Creatinine, Ser: 0.81 mg/dL (ref 0.50–1.10)
GFR calc Af Amer: >90 mL/min (ref >90)
GFR calc non Af Amer: >90 mL/min (ref >90)
Glucose, Bld: 102 mg/dL — ABNORMAL HIGH (ref 70–99)
Potassium: 3.3 mEq/L — ABNORMAL LOW (ref 3.5–5.1)
Sodium: 137 mEq/L (ref 135–145)

## 2013-03-01 MED ORDER — FUROSEMIDE 20 MG PO TABS
20.0000 mg | ORAL_TABLET | Freq: Every day | ORAL | Status: AC
  Administered 2013-03-01 – 2013-03-02 (×2): 20 mg via ORAL
  Filled 2013-03-01 (×2): qty 1

## 2013-03-01 MED ORDER — POTASSIUM CHLORIDE 20 MEQ/15ML (10%) PO LIQD
40.0000 meq | Freq: Two times a day (BID) | ORAL | Status: AC
  Administered 2013-03-01 – 2013-03-03 (×4): 40 meq via ORAL
  Filled 2013-03-01 (×4): qty 30

## 2013-03-01 NOTE — Progress Notes (Signed)
Physical Therapy Treatment Patient Details Name: [name] MRN: [id number] DOB: 06-14-1971 Today's Date: 03/01/2013 Time: 5366-4403 PT Time Calculation (min): 18 min  PT Assessment / Plan / Recommendation  PT Comments   Pt continues to progress well with therapy with biggest deficits in higher level dynamic balance and gait activities.  She continues to have significant LE edema.  She continues to be appropriate for OP PT for balance and gait training at discharge.  She reports compliance with already given HEP.   Follow Up Recommendations  Outpatient PT;Supervision - Intermittent     Does the patient have the potential to tolerate intense rehabilitation    Yes  Barriers to Discharge   none      Equipment Recommendations  None recommended by PT    Recommendations for Other Services   none  Frequency Min 3X/week   Progress towards PT Goals Progress towards PT goals: Progressing toward goals  Plan Current plan remains appropriate    Precautions / Restrictions   none  Pertinent Vitals/Pain See vitals flow sheet.     Mobility  Bed Mobility Bed Mobility: Not assessed Transfers Sit to Stand: 6: Modified independent (Device/Increase time) Stand to Sit: 6: Modified independent (Device/Increase time) Details for Transfer Assistance: reliance on hands and extra time needed Ambulation/Gait Ambulation/Gait Assistance: 6: Modified independent (Device/Increase time) Ambulation Distance (Feet): 300 Feet Assistive device: None Ambulation/Gait Assistance Details: pt with self selected slower speed due to leg edema and tightness.  She can increase gait speed when asked.   Gait Pattern: Step-through pattern Stairs: Yes Stairs Assistance: 6: Modified independent (Device/Increase time) Stair Management Technique: One rail Right;Forwards Number of Stairs: 10      PT Goals (current goals can now be found in the care plan section)    Visit Information  Last PT Received On:  03/01/13 Assistance Needed: +1 History of Present Illness: 42 y.o. female admitted to [facility] for worsening intense HA.  CT of head revealed SAH.  She is s/p coiling.  Now with bil leg edema and bladder retention issues.     Subjective Data  Subjective: Pt reports she is feeling better, continued leg edema, continued leg tightness throughout.     Cognition  Cognition Arousal/Alertness: Awake/alert Behavior During Therapy: WFL for tasks assessed/performed Overall Cognitive Status: Within Functional Limits for tasks assessed    Balance  Berg Balance Test Sit to Stand: Able to stand without using hands and stabilize independently Standing Unsupported: Able to stand safely 2 minutes Sitting with Back Unsupported but Feet Supported on Floor or Stool: Able to sit safely and securely 2 minutes Stand to Sit: Sits safely with minimal use of hands Transfers: Able to transfer safely, minor use of hands Standing Unsupported with Eyes Closed: Able to stand 10 seconds safely Standing Ubsupported with Feet Together: Able to place feet together independently and stand 1 minute safely From Standing, Reach Forward with Outstretched Arm: Can reach confidently >25 cm (10") From Standing Position, Pick up Object from Floor: Able to pick up shoe, needs supervision From Standing Position, Turn to Look Behind Over each Shoulder: Looks behind from both sides and weight shifts well Turn 360 Degrees: Able to turn 360 degrees safely in 4 seconds or less Standing Unsupported, Alternately Place Feet on Step/Stool: Able to complete 4 steps without aid or supervision Standing Unsupported, One Foot in Front: Able to plae foot ahead of the other independently and hold 30 seconds Standing on One Leg: Able to lift leg independently and hold  5-10 seconds Total Score: 51  End of Session PT - End of Session Activity Tolerance: Patient tolerated treatment well Patient left: in chair;with call bell/phone within reach;with  family/visitor present     Lurena Joiner B. Acel Natzke, PT, DPT [phone]   03/01/2013, 12:57 PM

## 2013-03-01 NOTE — Progress Notes (Addendum)
Upon shift assessment, pt reports that she is still having difficulty urinating, both with initiating stream and emptying bladder completely.  Pt educated to avoid straining to empty bladder, will continue to monitor I&O.  200 cc clear yellow urine out, pt feels as if bladder is empty, no bladder scan at this time.

## 2013-03-01 NOTE — Progress Notes (Signed)
Patient ID: [name], female   DOB: 12-09-1970, 42 y.o.   MRN: [id number] BP 129/78  Pulse 92  Temp(Src) 97.3 F (36.3 C) (Oral)  Resp 18  Ht 5\' 6"  (1.676 m)  Wt 87.6 kg (193 lb 2 oz)  BMI 31.19 kg/m2  SpO2 94%  LMP 01/28/2013 Alert, oriented x 4 Speech is clear and fluent, normal cranial nerve exam Moving all extremities Lower extremity edema, will give lasix, and potassium, level was low at 3.3

## 2013-03-01 NOTE — Progress Notes (Signed)
   CARE MANAGEMENT NOTE 03/01/2013  Patient:  [name], [name]   Account Number:  [id number]  Date Initiated:  02/23/2013  Documentation initiated by:  Elmer Bales  Subjective/Objective Assessment:   coiling of aneurysm     Action/Plan:   Will follow for discharge needs.   Anticipated DC Date:  03/05/2013   Anticipated DC Plan:  HOME/SELF CARE      DC Planning Services  CM consult      Choice offered to / List presented to:             Status of service:  In process, will continue to follow Medicare Important Message given?   (If response is "NO", the following Medicare IM given date fields will be blank) Date Medicare IM given:   Date Additional Medicare IM given:    Discharge Disposition:    Per UR Regulation:    If discussed at Long Length of Stay Meetings, dates discussed:    Comments:  03/01/13 1115  Elmer Bales RN, MSN CM-  Met with patient to discuss Nimotop after discharge.  Pt was informed that not all pharmacies carry the medication, so CM is preparing in advance of discharge.  CM called patient's pharmacy in [place], [place] which does not have the ability to order the medication.  [facility] on [address] in [place], does not have it, but is able to order it overnight.  Pt is agreeable to using this pharmacy.  Charge nurse Tami was informed of need for physician to send script to pharmacy when he rounds today. Patient aware of status.

## 2013-03-02 LAB — SODIUM: Sodium: 138 mEq/L (ref 135–145)

## 2013-03-02 LAB — POTASSIUM: Potassium: 3.8 mEq/L (ref 3.5–5.1)

## 2013-03-02 NOTE — Progress Notes (Signed)
Patient ID: [name], female   DOB: Nov 27, 1970, 42 y.o.   MRN: [id number] BP 131/66  Pulse 80  Temp(Src) 98 F (36.7 C) (Oral)  Resp 18  Ht 5\' 6"  (1.676 m)  Wt 87.6 kg (193 lb 2 oz)  BMI 31.19 kg/m2  SpO2 100%  LMP 01/28/2013 Alert and oriented x 4 Moving lower and upper extremities well Will discharge tomorrow Doing well

## 2013-03-02 NOTE — Progress Notes (Signed)
VASCULAR LAB PRELIMINARY  PRELIMINARY  PRELIMINARY  PRELIMINARY  Transcranial Doppler  Date POD PCO2 HCT BP  MCA ACA PCA OPHT SIPH VERT Basilar  6-19- 14 SB     Right  Left   55  50   -29  -42   25  30/12   14  13    32  44   --  -20     -34    6-20- 14 SB     Right  Left   78  54   -42  -44   26  -23   13  14    50  52   -20  -38     -35    6-23- 14 SB     Right  Left   58  64   -75  -57   36  33   15  17   41  47   -23  -25     -47    6-25- 14 SB      Right  Left   46  40   -50  -27   17  -16   12  14    37/61  --   -31  -25     -33    02-26-13 VS      Right  Left   55  59   -40  -28   50  40   16  15   -  -   -49  -35   -47      03-02-13 VS     Right  Left   53  41   -35  -62   34  33   14  18   42  25   -27  -28   -31           Right  Left                                        MCA = Middle Cerebral Artery      OPHT = Opthalmic Artery     BASILAR = Basilar Artery   ACA = Anterior Cerebral Artery     SIPH = Carotid Siphon PCA = Posterior Cerebral Artery   VERT = Verterbral Artery                   Normal MCA = 62+\-12 ACA = 50+\-12 PCA = 42+\-23         Rosanne Wohlfarth, RVS 03/02/2013, 2:13 PM

## 2013-03-03 MED ORDER — HYDROMORPHONE HCL 2 MG PO TABS: 2.0000 mg | ORAL_TABLET | ORAL | Status: DC | PRN

## 2013-03-03 MED ORDER — ALPRAZOLAM 0.25 MG PO TABS: 0.2500 mg | ORAL_TABLET | Freq: Every evening | ORAL | Status: DC | PRN

## 2013-03-03 NOTE — Progress Notes (Signed)
Physical Therapy Treatment Patient Details Name: [name] MRN: [id number] DOB: 1971/06/06 Today's Date: 03/03/2013 Time: 6440-3474 PT Time Calculation (min): 19 min  PT Assessment / Plan / Recommendation  PT Comments   Pt has met acute PT goals and is rady for d/c to OP PT, likely to be d/c'ed today  Follow Up Recommendations  Outpatient PT;Supervision - Intermittent     Does the patient have the potential to tolerate intense rehabilitation     Barriers to Discharge        Equipment Recommendations  None recommended by PT    Recommendations for Other Services    Frequency     Progress towards PT Goals Progress towards PT goals: Goals met/education completed, patient discharged from PT  Plan Other (comment) (d.c PT)    Precautions / Restrictions Precautions Precautions: None Restrictions Weight Bearing Restrictions: No   Pertinent Vitals/Pain VSS, swelling not significant in LE's today    Mobility  Bed Mobility Bed Mobility: Not assessed (pt reports independence) Transfers Transfers: Sit to Stand;Stand to Sit Sit to Stand: 7: Independent Stand to Sit: 7: Independent Ambulation/Gait Ambulation/Gait Assistance: 7: Independent Ambulation Distance (Feet): 400 Feet Assistive device: None Ambulation/Gait Assistance Details: no LOB, normal gait speed and pattern Gait Pattern: Within Functional Limits Gait velocity: WFL, >4.4 ft/sec Stairs: Yes Stairs Assistance: 6: Modified independent (Device/Increase time) Stair Management Technique: No rails;Alternating pattern;Forwards Number of Stairs: 10 Wheelchair Mobility Wheelchair Mobility: No    Exercises General Exercises - Lower Extremity Ankle Circles/Pumps: AROM;Both;10 reps   PT Diagnosis:    PT Problem List:   PT Treatment Interventions:     PT Goals (current goals can now be found in the care plan section) Acute Rehab PT Goals Patient Stated Goal: to go home PT Goal Formulation: With patient Time  For Goal Achievement: 02/26/13 (goals met) Potential to Achieve Goals: Good  Visit Information  Last PT Received On: 03/03/13 Assistance Needed: +1 History of Present Illness: 42 y.o. female admitted to [facility] for worsening intense HA.  CT of head revealed SAH.  She is s/p coiling.  Now with bil leg edema and bladder retention issues.     Subjective Data  Subjective: pt reports feeling ready to d/c home Patient Stated Goal: to go home   Cognition  Cognition Arousal/Alertness: Awake/alert Behavior During Therapy: WFL for tasks assessed/performed Overall Cognitive Status: Within Functional Limits for tasks assessed    Balance  Balance Balance Assessed: Yes Standardized Balance Assessment Standardized Balance Assessment: Berg Balance Test;Dynamic Gait Index Berg Balance Test Sit to Stand: Able to stand without using hands and stabilize independently Standing Unsupported: Able to stand safely 2 minutes Sitting with Back Unsupported but Feet Supported on Floor or Stool: Able to sit safely and securely 2 minutes Stand to Sit: Sits safely with minimal use of hands Transfers: Able to transfer safely, minor use of hands Standing Unsupported with Eyes Closed: Able to stand 10 seconds safely Standing Ubsupported with Feet Together: Able to place feet together independently and stand 1 minute safely From Standing, Reach Forward with Outstretched Arm: Can reach confidently >25 cm (10") From Standing Position, Pick up Object from Floor: Able to pick up shoe safely and easily From Standing Position, Turn to Look Behind Over each Shoulder: Looks behind from both sides and weight shifts well Turn 360 Degrees: Able to turn 360 degrees safely in 4 seconds or less Standing Unsupported, Alternately Place Feet on Step/Stool: Able to stand independently and safely and complete 8 steps in  20 seconds Standing Unsupported, One Foot in Front: Able to place foot tandem independently and hold 30  seconds Standing on One Leg: Able to lift leg independently and hold 5-10 seconds Total Score: 55 Dynamic Gait Index Level Surface: Normal Change in Gait Speed: Normal Gait with Horizontal Head Turns: Normal Gait with Vertical Head Turns: Normal Gait and Pivot Turn: Normal Step Over Obstacle: Normal Step Around Obstacles: Normal Steps: Normal Total Score: 24  End of Session PT - End of Session Activity Tolerance: Patient tolerated treatment well Patient left: Other (comment) (bathroom for shower) Nurse Communication: Mobility status   GP   [name], PT  Acute Rehab Services  [phone]   [name] 03/03/2013, 10:07 AM

## 2013-03-07 ENCOUNTER — Other Ambulatory Visit: Payer: Self-pay | Admitting: Internal Medicine

## 2013-03-08 ENCOUNTER — Encounter: Payer: Self-pay | Admitting: Internal Medicine

## 2013-03-08 ENCOUNTER — Ambulatory Visit: Payer: BC Managed Care – PPO | Admitting: Licensed Clinical Social Worker

## 2013-03-08 ENCOUNTER — Ambulatory Visit (INDEPENDENT_AMBULATORY_CARE_PROVIDER_SITE_OTHER): Payer: BC Managed Care – PPO | Admitting: Internal Medicine

## 2013-03-08 VITALS — BP 150/100 | HR 78 | Temp 98.4°F | Resp 20 | Wt 233.0 lb

## 2013-03-08 DIAGNOSIS — Z9889 Other specified postprocedural states: Secondary | ICD-10-CM

## 2013-03-08 DIAGNOSIS — Z8679 Personal history of other diseases of the circulatory system: Secondary | ICD-10-CM

## 2013-03-08 MED ORDER — LORAZEPAM 0.5 MG PO TABS: 0.5000 mg | ORAL_TABLET | Freq: Two times a day (BID) | ORAL | Status: DC | PRN

## 2013-03-08 NOTE — Progress Notes (Signed)
Subjective:    Patient ID: [name], female    DOB: 1971-01-23, 42 y.o.   MRN: [id number]  HPI   42 year old patient who is seen following a recent hospital discharge. She was admitted to the hospital on June 18 after presenting to the urgent care with a severe headache associated with nausea. A head CT scan revealed a subarachnoid hemorrhage and she was last admitted to the neurosurgical service. A subsequent CTA confirmed a cerebral aneurysm which was treated by interventional neuroradiology.  The patient's mother required surgery for a cerebral aneurysm. Post hospital she has done quite well. Chronic medical problems include hypertension and dyslipidemia. She does have a history of allergic rhinitis. She is receiving counseling for anxiety disorder and is requesting a refill on lorazepam.  Past Medical History  Diagnosis Date  . PONV (postoperative nausea and vomiting)   . Hypertension   . Anxiety   . Depression     recent death of mother  . Hyperlipidemia     History   Social History  . Marital Status: Single    Spouse Name: N/A    Number of Children: N/A  . Years of Education: N/A   Occupational History  . Not on file.   Social History Main Topics  . Smoking status: Never Smoker   . Smokeless tobacco: Never Used  . Alcohol Use: Yes  . Drug Use: No  . Sexually Active: Not on file   Other Topics Concern  . Not on file   Social History Narrative  . No narrative on file    Past Surgical History  Procedure Laterality Date  . Myomectomy  2009  . Laparotomy    . Myomectomy  05/16/2011    Procedure: MYOMECTOMY;  Surgeon: Meriel Pica;  Location: [facility];  Service: Gynecology;  Laterality: N/A;  abdominal  . Radiology with anesthesia N/A 02/18/2013    Procedure: RADIOLOGY WITH ANESTHESIA;  Surgeon: Oneal Grout, MD;  Location: [facility];  Service: Radiology;  Laterality: N/A;    Family History  Problem Relation Age of Onset  . Heart disease Mother  37    cad  . Lupus Mother   . COPD Mother   . Diabetes Father     No Known Allergies  Current Outpatient Prescriptions on File Prior to Visit  Medication Sig Dispense Refill  . amLODipine-benazepril (LOTREL) 5-10 MG per capsule Take 1 capsule by mouth daily.      Marland Kitchen atorvastatin (LIPITOR) 80 MG tablet Take 0.5 tablets (40 mg total) by mouth daily.  30 tablet  5  . HYDROmorphone (DILAUDID) 2 MG tablet Take 1 tablet (2 mg total) by mouth every 4 (four) hours as needed for pain.  60 tablet  0  . norgestimate-ethinyl estradiol (ORTHO-CYCLEN,SPRINTEC,PREVIFEM) 0.25-35 MG-MCG tablet Take 1 tablet by mouth daily.      . sertraline (ZOLOFT) 100 MG tablet TAKE 1.5 TABLETS (150 MG TOTAL) BY MOUTH DAILY.  45 tablet  5  . benzonatate (TESSALON) 100 MG capsule Take 1 capsule (100 mg total) by mouth 2 (two) times daily as needed for cough.  20 capsule  0  . fluticasone (FLONASE) 50 MCG/ACT nasal spray Place 2 sprays into the nose daily.  16 g  1   No current facility-administered medications on file prior to visit.    BP 150/100  Pulse 78  Temp(Src) 98.4 F (36.9 C) (Oral)  Resp 20  Wt 233 lb (105.688 kg)  BMI 37.63 kg/m2  SpO2 98%  LMP 02/21/2013       Review of Systems  Constitutional: Negative.   HENT: Negative for hearing loss, congestion, sore throat, rhinorrhea, dental problem, sinus pressure and tinnitus.   Eyes: Negative for pain, discharge and visual disturbance.  Respiratory: Negative for cough and shortness of breath.   Cardiovascular: Negative for chest pain, palpitations and leg swelling.  Gastrointestinal: Negative for nausea, vomiting, abdominal pain, diarrhea, constipation, blood in stool and abdominal distention.  Genitourinary: Negative for dysuria, urgency, frequency, hematuria, flank pain, vaginal bleeding, vaginal discharge, difficulty urinating, vaginal pain and pelvic pain.  Musculoskeletal: Negative for joint swelling, arthralgias and gait problem.  Skin:  Negative for rash.  Neurological: Positive for headaches. Negative for dizziness, syncope, speech difficulty, weakness and numbness.  Hematological: Negative for adenopathy.  Psychiatric/Behavioral: Negative for behavioral problems, dysphoric mood and agitation. The patient is nervous/anxious.        Objective:   Physical Exam  Constitutional: She is oriented to person, place, and time. She appears well-developed and well-nourished. No distress.  Blood pressure 140/95  Eyes: EOM are normal. Pupils are equal, round, and reactive to light.  Neck: Normal range of motion.  Cardiovascular: Normal rate and regular rhythm.   Pulmonary/Chest: Effort normal and breath sounds normal. No respiratory distress. She has no wheezes. She has no rales.  Neurological: She is oriented to person, place, and time. She has normal reflexes. No cranial nerve deficit. Coordination normal.          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 Status post subarachnoid bleed Hypertension. Closer home blood pressure monitoring encouraged. Recheck 1 month Anxiety disorder. Lorazepam refilled continue counseling Dyslipidemia. Continue statin therapy

## 2013-03-08 NOTE — Patient Instructions (Signed)
Limit your sodium (Salt) intake  Please check your blood pressure on a regular basis.  If it is consistently greater than 150/90, please make an office appointment.  Followup neurosurgery  Return in 3 months for follow-up

## 2013-03-09 ENCOUNTER — Ambulatory Visit (INDEPENDENT_AMBULATORY_CARE_PROVIDER_SITE_OTHER): Payer: BC Managed Care – PPO | Admitting: Licensed Clinical Social Worker

## 2013-03-09 DIAGNOSIS — F39 Unspecified mood [affective] disorder: Secondary | ICD-10-CM

## 2013-03-16 ENCOUNTER — Ambulatory Visit (INDEPENDENT_AMBULATORY_CARE_PROVIDER_SITE_OTHER): Payer: BC Managed Care – PPO | Admitting: Licensed Clinical Social Worker

## 2013-03-16 DIAGNOSIS — F4322 Adjustment disorder with anxiety: Secondary | ICD-10-CM

## 2013-03-16 DIAGNOSIS — F331 Major depressive disorder, recurrent, moderate: Secondary | ICD-10-CM

## 2013-03-23 ENCOUNTER — Other Ambulatory Visit: Payer: Self-pay | Admitting: Internal Medicine

## 2013-03-24 NOTE — Discharge Summary (Addendum)
Admission diagnosis:Subarachnoid hemorrhage, ruptured Anterior communicating artery aneurysm Discharge diagnosis: Same Discharge destination: Home Status: Alive and well Procedures: Endovascular obliteration Anterior communicating artery aneurysm Comp: none Meds: Dilaudid Mrs. Caddell at discharge is alert, oriented x 4, speech is clear, fluent. Fund of knowledge is normal. Perrl, full eom. Tongue and uvula midline, symmetric facial sensation and movement, hearing intact to voice. 5/5 strength in all extremities, gait normal.She is tolerating a regular diet, voiding and ambulating at discharge.  Date of admission:02/17/2013 Discharge date: 03/03/2013

## 2013-03-25 ENCOUNTER — Ambulatory Visit (INDEPENDENT_AMBULATORY_CARE_PROVIDER_SITE_OTHER): Payer: BC Managed Care – PPO | Admitting: Licensed Clinical Social Worker

## 2013-03-25 DIAGNOSIS — F39 Unspecified mood [affective] disorder: Secondary | ICD-10-CM

## 2013-04-01 ENCOUNTER — Ambulatory Visit: Payer: BC Managed Care – PPO | Admitting: Licensed Clinical Social Worker

## 2013-04-07 ENCOUNTER — Ambulatory Visit (INDEPENDENT_AMBULATORY_CARE_PROVIDER_SITE_OTHER): Payer: BC Managed Care – PPO | Admitting: Licensed Clinical Social Worker

## 2013-04-07 DIAGNOSIS — F4322 Adjustment disorder with anxiety: Secondary | ICD-10-CM

## 2013-04-07 DIAGNOSIS — F331 Major depressive disorder, recurrent, moderate: Secondary | ICD-10-CM

## 2013-04-21 ENCOUNTER — Ambulatory Visit (INDEPENDENT_AMBULATORY_CARE_PROVIDER_SITE_OTHER): Payer: BC Managed Care – PPO | Admitting: Licensed Clinical Social Worker

## 2013-04-21 DIAGNOSIS — F4322 Adjustment disorder with anxiety: Secondary | ICD-10-CM

## 2013-04-21 DIAGNOSIS — F331 Major depressive disorder, recurrent, moderate: Secondary | ICD-10-CM

## 2013-05-06 ENCOUNTER — Ambulatory Visit (INDEPENDENT_AMBULATORY_CARE_PROVIDER_SITE_OTHER): Payer: BC Managed Care – PPO | Admitting: Licensed Clinical Social Worker

## 2013-05-06 DIAGNOSIS — F331 Major depressive disorder, recurrent, moderate: Secondary | ICD-10-CM

## 2013-05-06 DIAGNOSIS — F4322 Adjustment disorder with anxiety: Secondary | ICD-10-CM

## 2013-05-19 ENCOUNTER — Ambulatory Visit (INDEPENDENT_AMBULATORY_CARE_PROVIDER_SITE_OTHER): Payer: BC Managed Care – PPO | Admitting: Licensed Clinical Social Worker

## 2013-05-19 DIAGNOSIS — F331 Major depressive disorder, recurrent, moderate: Secondary | ICD-10-CM

## 2013-05-19 DIAGNOSIS — F4322 Adjustment disorder with anxiety: Secondary | ICD-10-CM

## 2013-05-31 ENCOUNTER — Other Ambulatory Visit (HOSPITAL_COMMUNITY): Payer: Self-pay | Admitting: Neurosurgery

## 2013-05-31 DIAGNOSIS — I729 Aneurysm of unspecified site: Secondary | ICD-10-CM

## 2013-06-02 ENCOUNTER — Ambulatory Visit: Payer: Commercial Managed Care - PPO | Admitting: Licensed Clinical Social Worker

## 2013-06-03 ENCOUNTER — Other Ambulatory Visit: Payer: Self-pay | Admitting: Radiology

## 2013-06-08 ENCOUNTER — Encounter (HOSPITAL_COMMUNITY): Payer: Self-pay | Admitting: Pharmacy Technician

## 2013-06-08 ENCOUNTER — Ambulatory Visit (HOSPITAL_COMMUNITY): Payer: BC Managed Care – PPO

## 2013-06-09 ENCOUNTER — Ambulatory Visit (INDEPENDENT_AMBULATORY_CARE_PROVIDER_SITE_OTHER): Payer: BC Managed Care – PPO | Admitting: Licensed Clinical Social Worker

## 2013-06-09 DIAGNOSIS — F4322 Adjustment disorder with anxiety: Secondary | ICD-10-CM

## 2013-06-09 DIAGNOSIS — F331 Major depressive disorder, recurrent, moderate: Secondary | ICD-10-CM

## 2013-06-11 ENCOUNTER — Ambulatory Visit (HOSPITAL_COMMUNITY)
Admission: RE | Admit: 2013-06-11 | Discharge: 2013-06-11 | Disposition: A | Discharge status: Home / Self Care | Payer: BC Managed Care – PPO | Source: Ambulatory Visit | Attending: Neurosurgery | Admitting: Neurosurgery

## 2013-06-11 ENCOUNTER — Encounter (HOSPITAL_COMMUNITY): Payer: Self-pay

## 2013-06-11 ENCOUNTER — Other Ambulatory Visit (HOSPITAL_COMMUNITY): Payer: Self-pay | Admitting: Neurosurgery

## 2013-06-11 DIAGNOSIS — I10 Essential (primary) hypertension: Secondary | ICD-10-CM | POA: Insufficient documentation

## 2013-06-11 DIAGNOSIS — R51 Headache: Secondary | ICD-10-CM | POA: Insufficient documentation

## 2013-06-11 DIAGNOSIS — E785 Hyperlipidemia, unspecified: Secondary | ICD-10-CM | POA: Insufficient documentation

## 2013-06-11 DIAGNOSIS — I729 Aneurysm of unspecified site: Secondary | ICD-10-CM

## 2013-06-11 DIAGNOSIS — I671 Cerebral aneurysm, nonruptured: Secondary | ICD-10-CM | POA: Insufficient documentation

## 2013-06-11 DIAGNOSIS — Z9889 Other specified postprocedural states: Secondary | ICD-10-CM | POA: Insufficient documentation

## 2013-06-11 LAB — CBC WITH DIFFERENTIAL/PLATELET
Basophils Absolute: 0 10*3/uL (ref 0.0–0.1)
Basophils Relative: 0 % (ref 0–1)
Eosinophils Absolute: 0.1 10*3/uL (ref 0.0–0.7)
Eosinophils Relative: 1 % (ref 0–5)
HCT: 38.3 % (ref 36.0–46.0)
Hemoglobin: 13.5 g/dL (ref 12.0–15.0)
Lymphocytes Relative: 19 % (ref 12–46)
Lymphs Abs: 1.9 10*3/uL (ref 0.7–4.0)
MCH: 31.3 pg (ref 26.0–34.0)
MCHC: 35.2 g/dL (ref 30.0–36.0)
MCV: 88.9 fL (ref 78.0–100.0)
Monocytes Absolute: 0.5 10*3/uL (ref 0.1–1.0)
Monocytes Relative: 6 % (ref 3–12)
Neutro Abs: 7.2 10*3/uL (ref 1.7–7.7)
Neutrophils Relative %: 74 % (ref 43–77)
Platelets: 291 10*3/uL (ref 150–400)
RBC: 4.31 MIL/uL (ref 3.87–5.11)
RDW: 11.8 % (ref 11.5–15.5)
WBC: 9.8 10*3/uL (ref 4.0–10.5)

## 2013-06-11 LAB — BASIC METABOLIC PANEL
BUN: 13 mg/dL (ref 6–23)
CO2: 27 mEq/L (ref 19–32)
Calcium: 9 mg/dL (ref 8.4–10.5)
Chloride: 99 mEq/L (ref 96–112)
Creatinine, Ser: 0.84 mg/dL (ref 0.50–1.10)
GFR calc Af Amer: >90 mL/min (ref >90)
GFR calc non Af Amer: 86 mL/min — ABNORMAL LOW (ref >90)
Glucose, Bld: 113 mg/dL — ABNORMAL HIGH (ref 70–99)
Potassium: 3.5 mEq/L (ref 3.5–5.1)
Sodium: 137 mEq/L (ref 135–145)

## 2013-06-11 LAB — PROTIME-INR
INR: 0.91 (ref 0.00–1.49)
Prothrombin Time: 12.1 seconds (ref 11.6–15.2)

## 2013-06-11 LAB — APTT: aPTT: 28 seconds (ref 24–37)

## 2013-06-11 MED ORDER — SODIUM CHLORIDE 0.9 % IV SOLN: INTRAVENOUS | Status: AC

## 2013-06-11 MED ORDER — FENTANYL CITRATE 0.05 MG/ML IJ SOLN
INTRAMUSCULAR | Status: AC
  Filled 2013-06-11: qty 2

## 2013-06-11 MED ORDER — MIDAZOLAM HCL 2 MG/2ML IJ SOLN
INTRAMUSCULAR | Status: AC | PRN
  Administered 2013-06-11: 1 mg via INTRAVENOUS
  Administered 2013-06-11 (×2): 0.5 mg via INTRAVENOUS

## 2013-06-11 MED ORDER — HEPARIN SOD (PORK) LOCK FLUSH 100 UNIT/ML IV SOLN
INTRAVENOUS | Status: AC | PRN
  Administered 2013-06-11: 1000 [IU] via INTRAVENOUS

## 2013-06-11 MED ORDER — SODIUM CHLORIDE 0.9 % IV SOLN
Freq: Once | INTRAVENOUS | Status: AC
  Administered 2013-06-11: 08:00:00 via INTRAVENOUS

## 2013-06-11 MED ORDER — IOHEXOL 300 MG/ML  SOLN
150.0000 mL | Freq: Once | INTRAMUSCULAR | Status: AC | PRN
  Administered 2013-06-11: 80 mL via INTRA_ARTERIAL

## 2013-06-11 MED ORDER — FENTANYL CITRATE 0.05 MG/ML IJ SOLN
INTRAMUSCULAR | Status: AC | PRN
  Administered 2013-06-11: 25 ug via INTRAVENOUS
  Administered 2013-06-11 (×2): 12.5 ug via INTRAVENOUS

## 2013-06-11 MED ORDER — HYDROCODONE-ACETAMINOPHEN 5-325 MG PO TABS
1.0000 | ORAL_TABLET | ORAL | Status: DC | PRN
  Administered 2013-06-11: 1 via ORAL

## 2013-06-11 MED ORDER — MIDAZOLAM HCL 2 MG/2ML IJ SOLN
INTRAMUSCULAR | Status: AC
  Filled 2013-06-11: qty 2

## 2013-06-11 NOTE — ED Notes (Signed)
Bilateral pedal pulses and tibial pulses +2 present

## 2013-06-11 NOTE — Procedures (Signed)
S/P 4 vessel cerebral arteriogram Rt CFa approach. Findings. 1No cloi compaction of previously coiled ACOM aneurysm. Stable approx 1.64mm x 2mm neck remnant

## 2013-06-11 NOTE — H&P (Signed)
[name] is an 42 y.o. female.   Chief Complaint: pt scheduled today for follow up cerebral arteriogram Previous anterior communicating artery aneurysm coiling 02/18/13 Pt has done well since Still has frontal headache maybe 2x/week Mild to moderate pain- resolves with Excedrin HPI: HTN; HLD  Past Medical History  Diagnosis Date  . PONV (postoperative nausea and vomiting)   . Hypertension   . Anxiety   . Depression     recent death of mother  . Hyperlipidemia     Past Surgical History  Procedure Laterality Date  . Myomectomy  2009  . Laparotomy    . Myomectomy  05/16/2011    Procedure: MYOMECTOMY;  Surgeon: Meriel Pica;  Location: [facility];  Service: Gynecology;  Laterality: N/A;  abdominal  . Radiology with anesthesia N/A 02/18/2013    Procedure: RADIOLOGY WITH ANESTHESIA;  Surgeon: Oneal Grout, MD;  Location: [facility];  Service: Radiology;  Laterality: N/A;    Family History  Problem Relation Age of Onset  . Heart disease Mother 28    cad  . Lupus Mother   . COPD Mother   . Diabetes Father    Social History:  reports that she has never smoked. She has never used smokeless tobacco. She reports that she drinks alcohol. She reports that she does not use illicit drugs.  Allergies: No Known Allergies   (Not in a hospital admission)  No results found for this or any previous visit (from the past 48 hour(s)). No results found.  Review of Systems  Constitutional: Negative for fever, chills and weight loss.  HENT:       Maybe 2x/week Frontal location- mild to moderate pain Resolves with Excedrin  Eyes: Negative for blurred vision and double vision.  Respiratory: Negative for cough and shortness of breath.   Cardiovascular: Negative for chest pain.  Gastrointestinal: Negative for nausea, vomiting and abdominal pain.  Musculoskeletal: Negative for neck pain.  Neurological: Positive for headaches. Negative for dizziness, tingling and weakness.     Blood pressure 133/79, pulse 76, temperature 98.3 F (36.8 C), temperature source Oral, resp. rate 20, height 5\' 6"  (1.676 m), weight 235 lb (106.595 kg), SpO2 97.00%. Physical Exam  Constitutional: She is oriented to person, place, and time. She appears well-developed and well-nourished.  Cardiovascular: Normal rate, regular rhythm and normal heart sounds.   No murmur heard. Respiratory: Effort normal and breath sounds normal. She has no wheezes.  GI: Soft. Bowel sounds are normal. There is no tenderness.  Musculoskeletal: Normal range of motion. She exhibits no tenderness.  Neurological: She is alert and oriented to person, place, and time. No cranial nerve deficit. Coordination normal.  Skin: Skin is warm and dry.  Psychiatric: She has a normal mood and affect. Her behavior is normal. Judgment and thought content normal.     Assessment/Plan ACOM artery aneurysm coiling 01/2013 Doing well; only occasional mild frontal headache Scheduled now for follow up cerebral arteriogram Pt aware of procedure benefits and risks and agreeable to proceed Consent signed and in chart  Yasin Ducat A 06/11/2013, 7:56 AM

## 2013-06-21 ENCOUNTER — Encounter: Payer: BC Managed Care – PPO | Admitting: Internal Medicine

## 2013-06-21 NOTE — Progress Notes (Signed)
Lipids- tolerating meds  htn-- no home bps, tolerating meds  Mood- doing well on sertraline  Reviewed pmh, psh, sochx  Reviewed meds   Well-developed well-nourished female in no acute distress. HEENT exam atraumatic, normocephalic, extraocular muscles are intact. Neck is supple. No jugular venous distention no thyromegaly. Chest clear to auscultation without increased work of breathing. Cardiac exam S1 and S2 are regular. Abdominal exam active bowel sounds, soft, nontender. Extremities no edema. Neurologic exam she is alert without any motor sensory deficits. Gait is normal.  This encounter was created in error - please disregard.

## 2013-06-23 ENCOUNTER — Ambulatory Visit: Payer: Commercial Managed Care - PPO | Admitting: Licensed Clinical Social Worker

## 2013-07-07 ENCOUNTER — Ambulatory Visit: Payer: Commercial Managed Care - PPO | Admitting: Licensed Clinical Social Worker

## 2013-07-08 ENCOUNTER — Other Ambulatory Visit: Payer: Self-pay

## 2013-07-09 ENCOUNTER — Encounter: Payer: Self-pay | Admitting: Internal Medicine

## 2013-07-09 ENCOUNTER — Ambulatory Visit (INDEPENDENT_AMBULATORY_CARE_PROVIDER_SITE_OTHER): Payer: BC Managed Care – PPO | Admitting: Internal Medicine

## 2013-07-09 VITALS — BP 126/96 | HR 88 | Temp 98.4°F | Wt 242.0 lb

## 2013-07-09 DIAGNOSIS — R10819 Abdominal tenderness, unspecified site: Secondary | ICD-10-CM

## 2013-07-09 DIAGNOSIS — R197 Diarrhea, unspecified: Secondary | ICD-10-CM | POA: Insufficient documentation

## 2013-07-09 DIAGNOSIS — Z23 Encounter for immunization: Secondary | ICD-10-CM

## 2013-07-09 NOTE — Progress Notes (Signed)
Chief Complaint  Patient presents with  . Loose Stool    Said she was diagnosed with diverticulitis in 2009.  Ongoing for about a week.  Tried taking Mylanta, Pepto Bismol, Tums and Prilosec OTC.  Has started eating Activia Yogurt.  . Abdominal Pain  . Gas  . Bloated    HPI: Patient comes in today for SDA for  new problem evaluation. No apapt with PCP available  Patient states she's had one week of diarrhea initially more frequent watery that has improved over the past week however still has 2 or 3 a day. No associated fever vomiting or blood has now turned to light tan and grey brown. However she has had some soreness in her lower abdomen and some gas. Has a remote history of what she was told was diverticulitis.  Wonders if she could have IBS as her mom has this she has been a bit worried and stressed that she is changing her job site. She is an Airline pilot.  She's tried to Pepto-Bismol Mylanta and other over-the-counter.  No travel exposures does have well water. No recent antibiotics the last few months.  Abdominal surgeries myomectomy.  She had a cerebral aneurysm diagnosed and treated this recent summer by interventional radiology. Mother had a cerebral aneurysm also.   ROS: See pertinent positives and negatives per HPI. No current chest pain shortness of breath headache neurologic signs syncope jaundice fever or chills. No new medications. No UTI symptoms No significant alcohol.  Past Medical History  Diagnosis Date  . PONV (postoperative nausea and vomiting)   . Hypertension   . Anxiety   . Depression     recent death of mother  . Hyperlipidemia     Family History  Problem Relation Age of Onset  . Heart disease Mother 33    cad  . Lupus Mother   . COPD Mother   . Diabetes Father     History   Social History  . Marital Status: Single    Spouse Name: N/A    Number of Children: N/A  . Years of Education: N/A   Social History Main Topics  . Smoking status:  Never Smoker   . Smokeless tobacco: Never Used  . Alcohol Use: Yes  . Drug Use: No  . Sexual Activity: None   Other Topics Concern  . None   Social History Narrative  . None    Outpatient Encounter Prescriptions as of 07/09/2013  Medication Sig  . amLODipine-benazepril (LOTREL) 5-10 MG per capsule Take 1 capsule by mouth daily.  Marland Kitchen atorvastatin (LIPITOR) 80 MG tablet Take 0.5 tablets (40 mg total) by mouth daily.  Marland Kitchen LORazepam (ATIVAN) 0.5 MG tablet Take 1 tablet (0.5 mg total) by mouth 2 (two) times daily as needed for anxiety.  . norgestimate-ethinyl estradiol (ORTHO-CYCLEN,SPRINTEC,PREVIFEM) 0.25-35 MG-MCG tablet Take 1 tablet by mouth daily.  . sertraline (ZOLOFT) 100 MG tablet Take 200 mg by mouth daily.     EXAM:  BP 126/96  Pulse 88  Temp(Src) 98.4 F (36.9 C) (Oral)  Wt 242 lb (109.77 kg)  SpO2 98%  LMP 06/16/2013  Body mass index is 39.08 kg/(m^2).  GENERAL: vitals reviewed and listed above, alert, oriented, appears well hydrated and in no acute distress she is nontoxic and does not look chronically ill  HEENT: atraumatic, conjunctiva  clear, no obvious abnormalities on inspection of external nose and ears OP : no lesion edema or exudate  NECK: no obvious masses on inspection palpation  No adenpathy  LUNGS: clear to auscultation bilaterally, no wheezes, rales or rhonchi, good air movement CV: HRRR, no clubbing cyanosis or  peripheral edema nl cap refill  Abdomen:  Sof,t normal bowel sounds without hepatosplenomegaly, no guarding rebound or masses no CVA tenderness no ascites noted points to mild tenderness in the lower abdomen bilaterally no guarding rebound or masses MS: moves all extremities without noticeable focal  abnormality PSYCH: pleasant and cooperative, no obvious depression or anxiety  ASSESSMENT AND PLAN:  Discussed the following assessment and plan:  Diarrhea  Lower abdominal tenderness  Need for prophylactic vaccination and inoculation against  influenza - Plan: Flu Vaccine QUAD 36+ mos PF IM (Fluarix) I suspect this is a bowel infection gastritis enteritis that should improve with time. It is certainly possible she could have irritable bowel but that would be probably postinfectious. No risk for C. difficile and I don't think this is diverticulitis. Discussed conservative therapy close observation and followup if persistent or progressive. Consider stool cultures etc. Alarm features discussed. Samples given of a probiotic [facility] that she can try might be helpful in some situations.  -Patient advised to return or notify health care team  if symptoms worsen or persist or new concerns arise.  Patient Instructions  This acts like a bowel  Infection that is trying to get better .  doest seem like  Diverticulitis at this time .  Avoid heavy mild product sugar beverages .   And caffeine and alcohol.  Try probiotic sometimes helps irritable  Bowel or  Some types of diarrhea problem.    If fever  Sever pain blood or not better in another week then contact office for further direction.     Neta Mends. [place] M.D.

## 2013-07-09 NOTE — Patient Instructions (Signed)
This acts like a bowel  Infection that is trying to get better .  doest seem like  Diverticulitis at this time .  Avoid heavy mild product sugar beverages .   And caffeine and alcohol.  Try probiotic sometimes helps irritable  Bowel or  Some types of diarrhea problem.    If fever  Sever pain blood or not better in another week then contact office for further direction.

## 2013-07-12 ENCOUNTER — Ambulatory Visit: Payer: BC Managed Care – PPO | Admitting: Family

## 2013-07-13 ENCOUNTER — Ambulatory Visit (INDEPENDENT_AMBULATORY_CARE_PROVIDER_SITE_OTHER): Payer: BC Managed Care – PPO | Admitting: Licensed Clinical Social Worker

## 2013-07-13 DIAGNOSIS — F331 Major depressive disorder, recurrent, moderate: Secondary | ICD-10-CM

## 2013-07-13 DIAGNOSIS — F4322 Adjustment disorder with anxiety: Secondary | ICD-10-CM

## 2013-08-09 ENCOUNTER — Ambulatory Visit: Payer: BC Managed Care – PPO | Admitting: Internal Medicine

## 2013-08-27 ENCOUNTER — Ambulatory Visit: Payer: BC Managed Care – PPO | Admitting: Internal Medicine

## 2013-09-09 ENCOUNTER — Ambulatory Visit (INDEPENDENT_AMBULATORY_CARE_PROVIDER_SITE_OTHER): Payer: BC Managed Care – PPO | Admitting: Family Medicine

## 2013-09-09 ENCOUNTER — Encounter: Payer: Self-pay | Admitting: Family Medicine

## 2013-09-09 VITALS — BP 110/90 | HR 88 | Temp 98.6°F | Wt 246.0 lb

## 2013-09-09 DIAGNOSIS — J329 Chronic sinusitis, unspecified: Secondary | ICD-10-CM

## 2013-09-09 MED ORDER — AMOXICILLIN 875 MG PO TABS: 875.0000 mg | ORAL_TABLET | Freq: Two times a day (BID) | ORAL | Status: DC

## 2013-09-09 NOTE — Progress Notes (Signed)
Pre visit review using our clinic review tool, if applicable. No additional management support is needed unless otherwise documented below in the visit note. 

## 2013-09-09 NOTE — Progress Notes (Signed)
Chief Complaint  Patient presents with  . Sinusitis    HPI:  -started: 2 weeks ago -symptoms:worsening yellow nasal congestion, sore throat, cough, drainage in throat, a little max sinus pressure/pain, low grade fever -denies:SOB, NVD, tooth pain, HA -has tried: OTC medications -sick contacts/travel/risks: denies flu exposure or Ebola risks -Hx TO:[id number] sinusitis  ROS: See pertinent positives and negatives per HPI.  Past Medical History  Diagnosis Date  . PONV (postoperative nausea and vomiting)   . Hypertension   . Anxiety   . Depression     recent death of mother  . Hyperlipidemia     Past Surgical History  Procedure Laterality Date  . Myomectomy  2009  . Laparotomy    . Myomectomy  05/16/2011    Procedure: MYOMECTOMY;  Surgeon: Margarette Asal;  Location: [facility];  Service: Gynecology;  Laterality: N/A;  abdominal  . Radiology with anesthesia N/A 02/18/2013    Procedure: RADIOLOGY WITH ANESTHESIA;  Surgeon: Rob Hickman, MD;  Location: [place];  Service: Radiology;  Laterality: N/A;    Family History  Problem Relation Age of Onset  . Heart disease Mother 100    cad  . Lupus Mother   . COPD Mother   . Diabetes Father     History   Social History  . Marital Status: Single    Spouse Name: N/A    Number of Children: N/A  . Years of Education: N/A   Social History Main Topics  . Smoking status: Never Smoker   . Smokeless tobacco: Never Used  . Alcohol Use: Yes  . Drug Use: No  . Sexual Activity: None   Other Topics Concern  . None   Social History Narrative  . None    Current outpatient prescriptions:amLODipine-benazepril (LOTREL) 5-10 MG per capsule, Take 1 capsule by mouth daily., Disp: , Rfl: ;  atorvastatin (LIPITOR) 80 MG tablet, Take 0.5 tablets (40 mg total) by mouth daily., Disp: 30 tablet, Rfl: 5;  LORazepam (ATIVAN) 0.5 MG tablet, Take 1 tablet (0.5 mg total) by mouth 2 (two) times daily as needed for anxiety., Disp: 60 tablet, Rfl:  2 norgestimate-ethinyl estradiol (ORTHO-CYCLEN,SPRINTEC,PREVIFEM) 0.25-35 MG-MCG tablet, Take 1 tablet by mouth daily., Disp: , Rfl: ;  sertraline (ZOLOFT) 100 MG tablet, Take 200 mg by mouth daily. , Disp: , Rfl: ;  amoxicillin (AMOXIL) 875 MG tablet, Take 1 tablet (875 mg total) by mouth 2 (two) times daily., Disp: 20 tablet, Rfl: 0  EXAM:  Filed Vitals:   09/09/13 1010  BP: 110/90  Pulse: 88  Temp: 98.6 F (37 C)    Body mass index is 39.72 kg/(m^2).  GENERAL: vitals reviewed and listed above, alert, oriented, appears well hydrated and in no acute distress  HEENT: atraumatic, conjunttiva clear, no obvious abnormalities on inspection of external nose and ears, normal appearance of ear canals and TMs, clear nasal congestion, mild post oropharyngeal erythema with PND, no tonsillar edema or exudate, no sinus TTP  NECK: no obvious masses on inspection  LUNGS: clear to auscultation bilaterally, no wheezes, rales or rhonchi, good air movement  CV: HRRR, no peripheral edema  MS: moves all extremities without noticeable abnormality  PSYCH: pleasant and cooperative, no obvious depression or anxiety  ASSESSMENT AND PLAN:  Discussed the following assessment and plan:  Sinusitis - Plan: amoxicillin (AMOXIL) 875 MG tablet  -We discussed potential etiologies, with sinusitis being most likely -We discussed treatment side effects, likely course, antibiotic, transmission, and signs of developing a serious illness. -  of course, we advised to return or notify a doctor immediately if symptoms worsen or persist or new concerns arise.    There are no Patient Instructions on file for this visit.   Colin Benton R.

## 2013-10-08 ENCOUNTER — Other Ambulatory Visit: Payer: Self-pay | Admitting: Internal Medicine

## 2013-10-22 ENCOUNTER — Other Ambulatory Visit (INDEPENDENT_AMBULATORY_CARE_PROVIDER_SITE_OTHER): Payer: Commercial Managed Care - PPO

## 2013-10-22 DIAGNOSIS — Z0000 Encounter for general adult medical examination without abnormal findings: Secondary | ICD-10-CM

## 2013-10-22 LAB — HEPATIC FUNCTION PANEL
ALT: 23 U/L (ref 0–35)
AST: 23 U/L (ref 0–37)
Albumin: 3.8 g/dL (ref 3.5–5.2)
Alkaline Phosphatase: 97 U/L (ref 39–117)
Bilirubin, Direct: 0 mg/dL (ref 0.0–0.3)
Total Bilirubin: 0.5 mg/dL (ref 0.3–1.2)
Total Protein: 7.4 g/dL (ref 6.0–8.3)

## 2013-10-22 LAB — CBC WITH DIFFERENTIAL/PLATELET
Basophils Absolute: 0 K/uL (ref 0.0–0.1)
Basophils Relative: 0.3 % (ref 0.0–3.0)
Eosinophils Absolute: 0.1 K/uL (ref 0.0–0.7)
Eosinophils Relative: 0.7 % (ref 0.0–5.0)
HCT: 39.7 % (ref 36.0–46.0)
Hemoglobin: 13.1 g/dL (ref 12.0–15.0)
Lymphocytes Relative: 17.4 % (ref 12.0–46.0)
Lymphs Abs: 1.8 K/uL (ref 0.7–4.0)
MCHC: 32.9 g/dL (ref 30.0–36.0)
MCV: 92.9 fl (ref 78.0–100.0)
Monocytes Absolute: 0.5 K/uL (ref 0.1–1.0)
Monocytes Relative: 4.8 % (ref 3.0–12.0)
Neutro Abs: 8.1 K/uL — ABNORMAL HIGH (ref 1.4–7.7)
Neutrophils Relative %: 76.8 % (ref 43.0–77.0)
Platelets: 293 K/uL (ref 150.0–400.0)
RBC: 4.27 Mil/uL (ref 3.87–5.11)
RDW: 12.8 % (ref 11.5–14.6)
WBC: 10.6 K/uL — ABNORMAL HIGH (ref 4.5–10.5)

## 2013-10-22 LAB — LIPID PANEL
Cholesterol: 191 mg/dL (ref 0–200)
HDL: 70 mg/dL (ref >39.00)
LDL Cholesterol: 95 mg/dL (ref 0–99)
Total CHOL/HDL Ratio: 3
Triglycerides: 130 mg/dL (ref 0.0–149.0)
VLDL: 26 mg/dL (ref 0.0–40.0)

## 2013-10-22 LAB — BASIC METABOLIC PANEL WITH GFR
BUN: 14 mg/dL (ref 6–23)
CO2: 28 meq/L (ref 19–32)
Calcium: 9.1 mg/dL (ref 8.4–10.5)
Chloride: 101 meq/L (ref 96–112)
Creatinine, Ser: 1 mg/dL (ref 0.4–1.2)
GFR: 68.35 mL/min
Glucose, Bld: 102 mg/dL — ABNORMAL HIGH (ref 70–99)
Potassium: 3.7 meq/L (ref 3.5–5.1)
Sodium: 136 meq/L (ref 135–145)

## 2013-10-22 LAB — POCT URINALYSIS DIPSTICK
Bilirubin, UA: NEGATIVE
Blood, UA: NEGATIVE
Glucose, UA: NEGATIVE
Ketones, UA: NEGATIVE
Leukocytes, UA: NEGATIVE
Nitrite, UA: NEGATIVE
Spec Grav, UA: 1.015
Urobilinogen, UA: 0.2
pH, UA: 7

## 2013-10-22 LAB — TSH: TSH: 2.37 u[IU]/mL (ref 0.35–5.50)

## 2013-10-25 ENCOUNTER — Ambulatory Visit (INDEPENDENT_AMBULATORY_CARE_PROVIDER_SITE_OTHER): Payer: Commercial Managed Care - PPO | Admitting: Internal Medicine

## 2013-10-25 ENCOUNTER — Encounter: Payer: Self-pay | Admitting: Internal Medicine

## 2013-10-25 VITALS — BP 132/98 | HR 76 | Temp 98.4°F | Ht 66.0 in | Wt 240.0 lb

## 2013-10-25 DIAGNOSIS — I671 Cerebral aneurysm, nonruptured: Secondary | ICD-10-CM

## 2013-10-25 DIAGNOSIS — Z0000 Encounter for general adult medical examination without abnormal findings: Secondary | ICD-10-CM

## 2013-10-25 MED ORDER — AMLODIPINE BESY-BENAZEPRIL HCL 10-40 MG PO CAPS: 1.0000 | ORAL_CAPSULE | Freq: Every day | ORAL | Status: DC

## 2013-10-25 NOTE — Progress Notes (Signed)
cpx Home bps 120s/70s  Past Medical History  Diagnosis Date  . PONV (postoperative nausea and vomiting)   . Hypertension   . Anxiety   . Depression     recent death of mother  . Hyperlipidemia     History   Social History  . Marital Status: Single    Spouse Name: N/A    Number of Children: N/A  . Years of Education: N/A   Occupational History  . Not on file.   Social History Main Topics  . Smoking status: Never Smoker   . Smokeless tobacco: Never Used  . Alcohol Use: Yes  . Drug Use: No  . Sexual Activity: Not on file   Other Topics Concern  . Not on file   Social History Narrative  . No narrative on file    Past Surgical History  Procedure Laterality Date  . Myomectomy  2009  . Laparotomy    . Myomectomy  05/16/2011    Procedure: MYOMECTOMY;  Surgeon: Margarette Asal;  Location: [facility];  Service: Gynecology;  Laterality: N/A;  abdominal  . Radiology with anesthesia N/A 02/18/2013    Procedure: RADIOLOGY WITH ANESTHESIA;  Surgeon: Rob Hickman, MD;  Location: [facility];  Service: Radiology;  Laterality: N/A;    Family History  Problem Relation Age of Onset  . Heart disease Mother 84    cad  . Lupus Mother   . COPD Mother   . Diabetes Father     No Known Allergies  Current Outpatient Prescriptions on File Prior to Visit  Medication Sig Dispense Refill  . amLODipine-benazepril (LOTREL) 5-10 MG per capsule TAKE ONE CAPSULE BY MOUTH EVERY DAY  30 capsule  0  . amoxicillin (AMOXIL) 875 MG tablet Take 1 tablet (875 mg total) by mouth 2 (two) times daily.  20 tablet  0  . atorvastatin (LIPITOR) 80 MG tablet Take 0.5 tablets (40 mg total) by mouth daily.  30 tablet  5  . LORazepam (ATIVAN) 0.5 MG tablet Take 1 tablet (0.5 mg total) by mouth 2 (two) times daily as needed for anxiety.  60 tablet  2  . norgestimate-ethinyl estradiol (ORTHO-CYCLEN,SPRINTEC,PREVIFEM) 0.25-35 MG-MCG tablet Take 1 tablet by mouth daily.      . sertraline (ZOLOFT) 100 MG tablet  Take 200 mg by mouth daily.        No current facility-administered medications on file prior to visit.     patient denies chest pain, shortness of breath, orthopnea. Denies lower extremity edema, abdominal pain, change in appetite, change in bowel movements. Patient denies rashes, musculoskeletal complaints. No other specific complaints in a complete review of systems.   Reviewed vitals  Well-developed well-nourished female in no acute distress. HEENT exam atraumatic, normocephalic, extraocular muscles are intact. Neck is supple. No jugular venous distention no thyromegaly. Chest clear to auscultation without increased work of breathing. Cardiac exam S1 and S2 are regular. Abdominal exam active bowel sounds, soft, nontender. Extremities no edema. Neurologic exam she is alert without any motor sensory deficits. Gait is normal.   Well visit- health maint UTD She understands need to lose weight (she has started an exercise plan). Will address all risk factors for vascular disease given her hx of anterior cerebral artery aneurysm rupture

## 2013-10-25 NOTE — Progress Notes (Signed)
Pre visit review using our clinic review tool, if applicable. No additional management support is needed unless otherwise documented below in the visit note. 

## 2013-10-27 ENCOUNTER — Other Ambulatory Visit: Payer: Self-pay | Admitting: Internal Medicine

## 2013-10-29 ENCOUNTER — Encounter: Payer: BC Managed Care – PPO | Admitting: Internal Medicine

## 2013-11-25 ENCOUNTER — Encounter: Payer: Self-pay | Admitting: Internal Medicine

## 2013-11-29 ENCOUNTER — Ambulatory Visit: Payer: Commercial Managed Care - PPO | Admitting: Internal Medicine

## 2013-11-30 ENCOUNTER — Other Ambulatory Visit (HOSPITAL_COMMUNITY): Payer: Self-pay | Admitting: Interventional Radiology

## 2013-11-30 DIAGNOSIS — I729 Aneurysm of unspecified site: Secondary | ICD-10-CM

## 2013-12-06 ENCOUNTER — Telehealth: Payer: Self-pay | Admitting: Family Medicine

## 2013-12-06 ENCOUNTER — Ambulatory Visit: Payer: Commercial Managed Care - PPO | Admitting: Internal Medicine

## 2013-12-06 VITALS — BP 138/84

## 2013-12-06 DIAGNOSIS — I10 Essential (primary) hypertension: Secondary | ICD-10-CM

## 2013-12-06 NOTE — Telephone Encounter (Signed)
Pt stopped by office before lunch and had blood pressure checked. It was 138/84, she just wanted to let you know.

## 2013-12-15 ENCOUNTER — Encounter (HOSPITAL_COMMUNITY): Payer: Self-pay | Admitting: Pharmacy Technician

## 2013-12-15 ENCOUNTER — Other Ambulatory Visit: Payer: Self-pay | Admitting: Radiology

## 2013-12-17 ENCOUNTER — Other Ambulatory Visit (HOSPITAL_COMMUNITY): Payer: Self-pay | Admitting: Interventional Radiology

## 2013-12-17 ENCOUNTER — Ambulatory Visit (HOSPITAL_COMMUNITY)
Admission: RE | Admit: 2013-12-17 | Discharge: 2013-12-17 | Disposition: A | Discharge status: Home / Self Care | Payer: Commercial Managed Care - PPO | Source: Ambulatory Visit | Attending: Interventional Radiology | Admitting: Interventional Radiology

## 2013-12-17 DIAGNOSIS — I671 Cerebral aneurysm, nonruptured: Secondary | ICD-10-CM | POA: Insufficient documentation

## 2013-12-17 DIAGNOSIS — I729 Aneurysm of unspecified site: Secondary | ICD-10-CM

## 2013-12-17 DIAGNOSIS — F411 Generalized anxiety disorder: Secondary | ICD-10-CM | POA: Insufficient documentation

## 2013-12-17 DIAGNOSIS — F3289 Other specified depressive episodes: Secondary | ICD-10-CM | POA: Insufficient documentation

## 2013-12-17 DIAGNOSIS — F329 Major depressive disorder, single episode, unspecified: Secondary | ICD-10-CM | POA: Insufficient documentation

## 2013-12-17 DIAGNOSIS — R51 Headache: Secondary | ICD-10-CM | POA: Insufficient documentation

## 2013-12-17 DIAGNOSIS — I10 Essential (primary) hypertension: Secondary | ICD-10-CM | POA: Insufficient documentation

## 2013-12-17 DIAGNOSIS — E785 Hyperlipidemia, unspecified: Secondary | ICD-10-CM | POA: Insufficient documentation

## 2013-12-17 DIAGNOSIS — Z9889 Other specified postprocedural states: Secondary | ICD-10-CM | POA: Insufficient documentation

## 2013-12-17 LAB — CBC WITH DIFFERENTIAL/PLATELET
Basophils Absolute: 0 10*3/uL (ref 0.0–0.1)
Basophils Relative: 0 % (ref 0–1)
Eosinophils Absolute: 0.1 10*3/uL (ref 0.0–0.7)
Eosinophils Relative: 1 % (ref 0–5)
HCT: 39.5 % (ref 36.0–46.0)
Hemoglobin: 13 g/dL (ref 12.0–15.0)
Lymphocytes Relative: 25 % (ref 12–46)
Lymphs Abs: 1.9 10*3/uL (ref 0.7–4.0)
MCH: 30.4 pg (ref 26.0–34.0)
MCHC: 32.9 g/dL (ref 30.0–36.0)
MCV: 92.5 fL (ref 78.0–100.0)
Monocytes Absolute: 0.5 10*3/uL (ref 0.1–1.0)
Monocytes Relative: 7 % (ref 3–12)
Neutro Abs: 5.1 10*3/uL (ref 1.7–7.7)
Neutrophils Relative %: 67 % (ref 43–77)
Platelets: 306 10*3/uL (ref 150–400)
RBC: 4.27 MIL/uL (ref 3.87–5.11)
RDW: 12.3 % (ref 11.5–15.5)
WBC: 7.6 10*3/uL (ref 4.0–10.5)

## 2013-12-17 LAB — BASIC METABOLIC PANEL
BUN: 15 mg/dL (ref 6–23)
CO2: 23 mEq/L (ref 19–32)
Calcium: 9.4 mg/dL (ref 8.4–10.5)
Chloride: 99 mEq/L (ref 96–112)
Creatinine, Ser: 0.97 mg/dL (ref 0.50–1.10)
GFR calc Af Amer: 84 mL/min — ABNORMAL LOW (ref >90)
GFR calc non Af Amer: 72 mL/min — ABNORMAL LOW (ref >90)
Glucose, Bld: 102 mg/dL — ABNORMAL HIGH (ref 70–99)
Potassium: 3.7 mEq/L (ref 3.7–5.3)
Sodium: 137 mEq/L (ref 137–147)

## 2013-12-17 LAB — PROTIME-INR
INR: 1.01 (ref 0.00–1.49)
Prothrombin Time: 13.1 seconds (ref 11.6–15.2)

## 2013-12-17 LAB — APTT: aPTT: 29 seconds (ref 24–37)

## 2013-12-17 MED ORDER — MIDAZOLAM HCL 2 MG/2ML IJ SOLN
INTRAMUSCULAR | Status: AC
  Filled 2013-12-17: qty 2

## 2013-12-17 MED ORDER — FENTANYL CITRATE 0.05 MG/ML IJ SOLN
INTRAMUSCULAR | Status: AC
  Filled 2013-12-17: qty 2

## 2013-12-17 MED ORDER — MIDAZOLAM HCL 2 MG/2ML IJ SOLN
INTRAMUSCULAR | Status: AC | PRN
  Administered 2013-12-17: 1 mg via INTRAVENOUS

## 2013-12-17 MED ORDER — FENTANYL CITRATE 0.05 MG/ML IJ SOLN
INTRAMUSCULAR | Status: AC | PRN
  Administered 2013-12-17 (×2): 25 ug via INTRAVENOUS

## 2013-12-17 MED ORDER — IOHEXOL 300 MG/ML  SOLN
150.0000 mL | Freq: Once | INTRAMUSCULAR | Status: AC | PRN
  Administered 2013-12-17: 1 mL via INTRA_ARTERIAL

## 2013-12-17 MED ORDER — SODIUM CHLORIDE 0.9 % IV SOLN
INTRAVENOUS | Status: DC
  Administered 2013-12-17: 07:00:00 via INTRAVENOUS

## 2013-12-17 MED ORDER — SODIUM CHLORIDE 0.9 % IV SOLN: INTRAVENOUS | Status: AC

## 2013-12-17 MED ORDER — HEPARIN SOD (PORK) LOCK FLUSH 100 UNIT/ML IV SOLN
INTRAVENOUS | Status: AC | PRN
  Administered 2013-12-17: 500 [IU] via INTRAVENOUS
  Administered 2013-12-17: 1000 [IU] via INTRAVENOUS

## 2013-12-17 NOTE — Procedures (Signed)
S/P 4 vessel cerebral arteriogram Rt CFA approach. Findings. Approx 3.79mm x 3.2 mm neck remnant at previously coiled ACom aneurysm

## 2013-12-17 NOTE — Progress Notes (Signed)
Pt ambulated without difficulty.  Dressing changed to (R) groin.  Site unremarkable.  Pt verbalizes understanding of D/C instructions

## 2013-12-17 NOTE — H&P (Signed)
[name] is an 43 y.o. female.   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 She presents today for follow up cerebral arteriogram to assess stability.  Past Medical History  Diagnosis Date  . PONV (postoperative nausea and vomiting)   . Hypertension   . Anxiety   . Depression     recent death of mother  . Hyperlipidemia     Past Surgical History  Procedure Laterality Date  . Myomectomy  2009  . Laparotomy    . Myomectomy  05/16/2011    Procedure: MYOMECTOMY;  Surgeon: Margarette Asal;  Location: [facility];  Service: Gynecology;  Laterality: N/A;  abdominal  . Radiology with anesthesia N/A 02/18/2013    Procedure: RADIOLOGY WITH ANESTHESIA;  Surgeon: Rob Hickman, MD;  Location: [facility];  Service: Radiology;  Laterality: N/A;    Family History  Problem Relation Age of Onset  . Heart disease Mother 16    cad  . Lupus Mother   . COPD Mother   . Diabetes Father    Social History:  reports that she has never smoked. She has never used smokeless tobacco. She reports that she drinks alcohol. She reports that she does not use illicit drugs.  Allergies: No Known Allergies  Current outpatient prescriptions:amLODipine-benazepril (LOTREL) 10-40 MG per capsule, Take 1 capsule by mouth daily., Disp: 90 capsule, Rfl: 3;  aspirin-acetaminophen-caffeine (EXCEDRIN MIGRAINE) 250-250-65 MG per tablet, Take 1 tablet by mouth every 6 (six) hours as needed for headache., Disp: , Rfl: ;  atorvastatin (LIPITOR) 80 MG tablet, Take 40 mg by mouth daily. , Disp: , Rfl:  LORazepam (ATIVAN) 0.5 MG tablet, Take 1 tablet (0.5 mg total) by mouth 2 (two) times daily as needed for anxiety., Disp: 60 tablet, Rfl: 2;  Multiple Vitamin (MULTIVITAMIN WITH MINERALS) TABS tablet, Take 1 tablet by mouth daily., Disp: , Rfl: ;  naproxen sodium (ANAPROX) 220 MG  tablet, Take 220 mg by mouth 2 (two) times daily with a meal., Disp: , Rfl:  norgestimate-ethinyl estradiol (ORTHO-CYCLEN,SPRINTEC,PREVIFEM) 0.25-35 MG-MCG tablet, Take 1 tablet by mouth daily., Disp: , Rfl: ;  Omega-3 Fatty Acids (FISH OIL PO), Take 1 capsule by mouth daily., Disp: , Rfl: ;  sertraline (ZOLOFT) 100 MG tablet, Take 200 mg by mouth daily. , Disp: , Rfl:  Current facility-administered medications:0.9 %  sodium chloride infusion, , Intravenous, Continuous, Ascencion Dike, PA-C, Last Rate: 50 mL/hr at 12/17/13 0702   Results for orders placed during the hospital encounter of 12/17/13 (from the past 48 hour(s))  APTT     Status: None   Collection Time    12/17/13  6:39 AM      Result Value Ref Range   aPTT 29  24 - 37 seconds  BASIC METABOLIC PANEL     Status: Abnormal   Collection Time    12/17/13  6:39 AM      Result Value Ref Range   Sodium 137  137 - 147 mEq/L   Potassium 3.7  3.7 - 5.3 mEq/L   Chloride 99  96 - 112 mEq/L   CO2 23  19 - 32 mEq/L   Glucose, Bld 102 (*) 70 - 99 mg/dL   BUN 15  6 - 23 mg/dL   Creatinine, Ser 0.97  0.50 - 1.10 mg/dL   Calcium 9.4  8.4 - 10.5 mg/dL   GFR calc non  Af Amer 72 (*) >90 mL/min   GFR calc Af Amer 84 (*) >90 mL/min   Comment: (NOTE)     The eGFR has been calculated using the CKD EPI equation.     This calculation has not been validated in all clinical situations.     eGFR's persistently <90 mL/min signify possible Chronic Kidney     Disease.  CBC WITH DIFFERENTIAL     Status: None   Collection Time    12/17/13  6:39 AM      Result Value Ref Range   WBC 7.6  4.0 - 10.5 K/uL   RBC 4.27  3.87 - 5.11 MIL/uL   Hemoglobin 13.0  12.0 - 15.0 g/dL   HCT 39.5  36.0 - 46.0 %   MCV 92.5  78.0 - 100.0 fL   MCH 30.4  26.0 - 34.0 pg   MCHC 32.9  30.0 - 36.0 g/dL   RDW 12.3  11.5 - 15.5 %   Platelets 306  150 - 400 K/uL   Neutrophils Relative % 67  43 - 77 %   Neutro Abs 5.1  1.7 - 7.7 K/uL   Lymphocytes Relative 25  12 - 46 %    Lymphs Abs 1.9  0.7 - 4.0 K/uL   Monocytes Relative 7  3 - 12 %   Monocytes Absolute 0.5  0.1 - 1.0 K/uL   Eosinophils Relative 1  0 - 5 %   Eosinophils Absolute 0.1  0.0 - 0.7 K/uL   Basophils Relative 0  0 - 1 %   Basophils Absolute 0.0  0.0 - 0.1 K/uL  PROTIME-INR     Status: None   Collection Time    12/17/13  6:39 AM      Result Value Ref Range   Prothrombin Time 13.1  11.6 - 15.2 seconds   INR 1.01  0.00 - 1.49   No results found.  Review of Systems  Constitutional: Negative for fever and chills.  HENT:       Occ HA's  Eyes: Negative for blurred vision, double vision and photophobia.  Respiratory: Negative for hemoptysis and shortness of breath.        Occ cough from sinus drainage  Cardiovascular: Negative for chest pain.  Gastrointestinal: Negative for nausea, vomiting and abdominal pain.       Occ irritable bowel sx's  Genitourinary: Negative for dysuria and hematuria.  Musculoskeletal: Negative for back pain.  Neurological: Negative for dizziness, sensory change, speech change, focal weakness and loss of consciousness.  Endo/Heme/Allergies: Does not bruise/bleed easily.    Blood pressure 134/84, pulse 72, temperature 98.2 F (36.8 C), temperature source Oral, resp. rate 18, height _0  (1.676 m), weight 228 lb (103.42 kg), last menstrual period 12/03/2013, SpO2 99.00%. Physical Exam  Constitutional: She is oriented to person, place, and time. She appears well-developed and well-nourished.  Eyes: EOM are normal. Pupils are equal, round, and reactive to light.  Cardiovascular: Normal rate and regular rhythm.   Respiratory: Effort normal and breath sounds normal.  GI: Soft. Bowel sounds are normal. There is no tenderness.  Musculoskeletal: Normal range of motion.  Neurological: She is alert and oriented to person, place, and time. No cranial nerve deficit. Coordination normal.  Psychiatric: She has a normal mood and affect.     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 She presents today for follow up cerebral arteriogram to assess stability.  Details/risks of procedure d/w pt with her understanding and consent.   Crissie Sickles  [place] 12/17/2013, 8:02 AM

## 2013-12-17 NOTE — Discharge Instructions (Signed)
Angiography, Care After °Refer to this sheet in the next few weeks. These instructions provide you with information on caring for yourself after your procedure. Your health care provider may also give you more specific instructions. Your treatment has been planned according to current medical practices, but problems sometimes occur. Call your health care provider if you have any problems or questions after your procedure.  °WHAT TO EXPECT AFTER THE PROCEDURE °After your procedure, it is typical to have the following sensations: °· Minor discomfort or tenderness and a small bump at the catheter insertion site. The bump should usually decrease in size and tenderness within 1 to 2 weeks. °· Any bruising will usually fade within 2 to 4 weeks. °HOME CARE INSTRUCTIONS  °· You may need to keep taking blood thinners if they were prescribed for you. Only take over-the-counter or prescription medicines for pain, fever, or discomfort as directed by your health care provider. °· Do not apply powder or lotion to the site. °· Do not sit in a bathtub, swimming pool, or whirlpool for 5 to 7 days. °· You may shower 24 hours after the procedure. Remove the bandage (dressing) and gently wash the site with plain soap and water. Gently pat the site dry. °· Inspect the site at least twice daily. °· Limit your activity for the first 48 hours. Do not bend, squat, or lift anything over 20 lb (9 kg) or as directed by your health care provider. °· Do not drive home if you are discharged the day of the procedure. Have someone else drive you. Follow instructions about when you can drive or return to work. °SEEK MEDICAL CARE IF: °· You get lightheaded when standing up. °· You have drainage (other than a small amount of blood on the dressing). °· You have chills. °· You have a fever. °· You have redness, warmth, swelling, or pain at the insertion site. °SEEK IMMEDIATE MEDICAL CARE IF:  °· You develop chest pain or shortness of breath, feel faint,  or pass out. °· You have bleeding, swelling larger than a walnut, or drainage from the catheter insertion site. °· You develop pain, discoloration, coldness, or severe bruising in the leg or arm that held the catheter. °· You develop bleeding from any other place, such as the bowels. You may see bright red blood in your urine or stools, or your stools may appear black and tarry. °· You have heavy bleeding from the site. If this happens, hold pressure on the site. °MAKE SURE YOU: °· Understand these instructions. °· Will watch your condition. °· Will get help right away if you are not doing well or get worse. °Document Released: 03/07/2005 Document Revised: 04/21/2013 Document Reviewed: 01/11/2013 °[employer]® Patient Information ©2014 [employer]. ° °

## 2013-12-22 ENCOUNTER — Ambulatory Visit (HOSPITAL_COMMUNITY): Payer: Commercial Managed Care - PPO

## 2013-12-27 ENCOUNTER — Ambulatory Visit (HOSPITAL_COMMUNITY)
Admission: RE | Admit: 2013-12-27 | Discharge: 2013-12-27 | Disposition: A | Discharge status: Home / Self Care | Payer: Commercial Managed Care - PPO | Source: Ambulatory Visit | Attending: Interventional Radiology | Admitting: Interventional Radiology

## 2013-12-27 DIAGNOSIS — I729 Aneurysm of unspecified site: Secondary | ICD-10-CM

## 2014-01-12 ENCOUNTER — Telehealth: Payer: Self-pay | Admitting: Internal Medicine

## 2014-01-12 ENCOUNTER — Encounter (HOSPITAL_BASED_OUTPATIENT_CLINIC_OR_DEPARTMENT_OTHER): Payer: Self-pay | Admitting: Emergency Medicine

## 2014-01-12 ENCOUNTER — Emergency Department (HOSPITAL_BASED_OUTPATIENT_CLINIC_OR_DEPARTMENT_OTHER)
Admission: EM | Admit: 2014-01-12 | Discharge: 2014-01-12 | Disposition: A | Discharge status: Home / Self Care | Payer: Commercial Managed Care - PPO | Attending: Emergency Medicine | Admitting: Emergency Medicine

## 2014-01-12 ENCOUNTER — Encounter: Payer: Self-pay | Admitting: Family Medicine

## 2014-01-12 ENCOUNTER — Ambulatory Visit (INDEPENDENT_AMBULATORY_CARE_PROVIDER_SITE_OTHER): Payer: Commercial Managed Care - PPO | Admitting: Family Medicine

## 2014-01-12 VITALS — BP 145/102 | HR 91 | Temp 99.5°F | Ht 66.0 in | Wt 227.0 lb

## 2014-01-12 DIAGNOSIS — E785 Hyperlipidemia, unspecified: Secondary | ICD-10-CM | POA: Insufficient documentation

## 2014-01-12 DIAGNOSIS — F411 Generalized anxiety disorder: Secondary | ICD-10-CM | POA: Insufficient documentation

## 2014-01-12 DIAGNOSIS — T464X5A Adverse effect of angiotensin-converting-enzyme inhibitors, initial encounter: Secondary | ICD-10-CM

## 2014-01-12 DIAGNOSIS — T783XXA Angioneurotic edema, initial encounter: Secondary | ICD-10-CM

## 2014-01-12 DIAGNOSIS — Z791 Long term (current) use of non-steroidal anti-inflammatories (NSAID): Secondary | ICD-10-CM | POA: Insufficient documentation

## 2014-01-12 DIAGNOSIS — Z79899 Other long term (current) drug therapy: Secondary | ICD-10-CM | POA: Insufficient documentation

## 2014-01-12 DIAGNOSIS — F3289 Other specified depressive episodes: Secondary | ICD-10-CM | POA: Insufficient documentation

## 2014-01-12 DIAGNOSIS — F329 Major depressive disorder, single episode, unspecified: Secondary | ICD-10-CM | POA: Insufficient documentation

## 2014-01-12 DIAGNOSIS — T44905A Adverse effect of unspecified drugs primarily affecting the autonomic nervous system, initial encounter: Secondary | ICD-10-CM | POA: Insufficient documentation

## 2014-01-12 DIAGNOSIS — I10 Essential (primary) hypertension: Secondary | ICD-10-CM

## 2014-01-12 MED ORDER — AMLODIPINE BESYLATE 10 MG PO TABS: 10.0000 mg | ORAL_TABLET | Freq: Every day | ORAL | Status: DC

## 2014-01-12 MED ORDER — METHYLPREDNISOLONE SODIUM SUCC 125 MG IJ SOLR
125.0000 mg | Freq: Once | INTRAMUSCULAR | Status: AC
  Administered 2014-01-12: 125 mg via INTRAMUSCULAR
  Filled 2014-01-12: qty 2

## 2014-01-12 MED ORDER — PREDNISONE 50 MG PO TABS: 50.0000 mg | ORAL_TABLET | Freq: Every day | ORAL | Status: DC

## 2014-01-12 MED ORDER — DIPHENHYDRAMINE HCL 25 MG PO TABS: 50.0000 mg | ORAL_TABLET | ORAL | Status: DC | PRN

## 2014-01-12 MED ORDER — DIPHENHYDRAMINE HCL 25 MG PO CAPS
50.0000 mg | ORAL_CAPSULE | Freq: Once | ORAL | Status: AC
  Administered 2014-01-12: 50 mg via ORAL
  Filled 2014-01-12: qty 2

## 2014-01-12 MED ORDER — FAMOTIDINE 20 MG PO TABS
20.0000 mg | ORAL_TABLET | Freq: Once | ORAL | Status: AC
  Administered 2014-01-12: 20 mg via ORAL
  Filled 2014-01-12: qty 1

## 2014-01-12 MED ORDER — HYDROCHLOROTHIAZIDE 25 MG PO TABS: 25.0000 mg | ORAL_TABLET | Freq: Every day | ORAL | Status: DC

## 2014-01-12 NOTE — ED Notes (Signed)
MD at bedside. 

## 2014-01-12 NOTE — Discharge Instructions (Signed)
Stop taking your benazepril. Followup with your primary MD regarding alternative hypertensive medications. Return immediately to emergency department for worsening swelling, difficulty breathing, wheezing, persistent vomiting or for any concerns.  Angioedema Angioedema is a sudden swelling of tissues, often of the skin. It can occur on the face or genitals or in the abdomen or other body parts. The swelling usually develops over a short period and gets better in 24 to 48 hours. It often begins during the night and is found when the person wakes up. The person may also get red, itchy patches of skin (hives). Angioedema can be dangerous if it involves swelling of the air passages.  Depending on the cause, episodes of angioedema may only happen once, come back in unpredictable patterns, or repeat for several years and then gradually fade away.  CAUSES  Angioedema can be caused by an allergic reaction to various triggers. It can also result from nonallergic causes, including reactions to drugs, immune system disorders, viral infections, or an abnormal gene that is passed to you from your parents (hereditary). For some people with angioedema, the cause is unknown.  Some things that can trigger angioedema include:   Foods.   Medicines, such as ACE inhibitors, ARBs, nonsteroidal anti-inflammatory agents, or estrogen.   Latex.   Animal saliva.   Insect stings.   Dyes used in X-rays.   Mild injury.   Dental work.  Surgery.  Stress.   Sudden changes in temperature.   Exercise. SIGNS AND SYMPTOMS   Swelling of the skin.  Hives. If these are present, there is also intense itching.  Redness in the affected area.   Pain in the affected area.  Swollen lips or tongue.  Breathing problems. This may happen if the air passages swell.  Wheezing. If internal organs are involved, there may be:   Nausea.   Abdominal pain.   Vomiting.   Difficulty swallowing.    Difficulty passing urine. DIAGNOSIS   Your health care provider will examine the affected area and take a medical and family history.  Various tests may be done to help determine the cause. Tests may include:  Allergy skin tests to see if the problem is an allergic reaction.   Blood tests to check for hereditary angioedema.   Tests to check for underlying diseases that could cause the condition.   A review of your medicines, including over the counter medicines, may be done. TREATMENT  Treatment will depend on the cause of the angioedema. Possible treatments include:   Removal of anything that triggered the condition (such as stopping certain medicines).   Medicines to treat symptoms or prevent attacks. Medicines given may include:   Antihistamines.   Epinephrine injection.   Steroids.   Hospitalization may be required for severe attacks. If the air passages are affected, it can be an emergency. Tubes may need to be placed to keep the airway open. HOME CARE INSTRUCTIONS   Only take over-the-counter or prescription medicines as directed by your health care provider.  If you were given medicines for emergency allergy treatment, always carry them with you.  Wear a medical bracelet as directed by your health care provider.   Avoid known triggers. SEEK MEDICAL CARE IF:   You have repeat attacks of angioedema.   Your attacks are more frequent or more severe despite preventive measures.   You have hereditary angioedema and are considering having children. It is important to discuss the risks of passing the condition on to your children with your  health care provider. SEEK IMMEDIATE MEDICAL CARE IF:   You have severe swelling of the mouth, tongue, or lips.  You have difficulty breathing.   You have difficulty swallowing.   You faint. MAKE SURE YOU:  Understand these instructions.  Will watch your condition.  Will get help right away if you are not  doing well or get worse. Document Released: 10/28/2001 Document Revised: 06/09/2013 Document Reviewed: 04/12/2013 [facility] Patient Information 2014 [place], [place].

## 2014-01-12 NOTE — Telephone Encounter (Signed)
Caller: [name]/Patient; Phone: [phone]; Reason for Call: States she was in the ED from 12:30am-4:30am this morning for facial swelling and was advised to d/c her BP med and to see MD today as it is important for her to be on an antihypertensive (needs to start different one). Reports facial swelling improved but still present. No other sxs and no new or worsening sxs since ED eval that would need triage. Her PCP, Dr. Leanne Chang, not in office today. Scheduled her with Dr. Sarajane Jews at 11am as she states she has seen Dr. Sarajane Jews before. Agreed to plan.

## 2014-01-12 NOTE — ED Notes (Signed)
Upper lip swelling onset 4-5 hours today. Denies SOB. Upper lip painful to touch, Recently had an increased dose on her ace-inhibitor; benazepril

## 2014-01-12 NOTE — ED Provider Notes (Signed)
CSN: [id number]     Arrival date & time 01/12/14  0045 History   First MD Initiated Contact with Patient 01/12/14 0051     No chief complaint on file.    (Consider location/radiation/quality/duration/timing/severity/associated sxs/prior Treatment) HPI Patient says she's had upper lip swelling since this afternoon. No previous history of the same. She has no intraoral swelling. She has no shortness of breath or wheezing. Tonight nausea or vomiting. She's been on Benazapril since February but recently had a this in her dose. Past Medical History  Diagnosis Date  . PONV (postoperative nausea and vomiting)   . Hypertension   . Anxiety   . Depression     recent death of mother  . Hyperlipidemia    Past Surgical History  Procedure Laterality Date  . Myomectomy  2009  . Laparotomy    . Myomectomy  05/16/2011    Procedure: MYOMECTOMY;  Surgeon: Margarette Asal;  Location: [facility];  Service: Gynecology;  Laterality: N/A;  abdominal  . Radiology with anesthesia N/A 02/18/2013    Procedure: RADIOLOGY WITH ANESTHESIA;  Surgeon: Rob Hickman, MD;  Location: [facility];  Service: Radiology;  Laterality: N/A;   Family History  Problem Relation Age of Onset  . Heart disease Mother 41    cad  . Lupus Mother   . COPD Mother   . Diabetes Father    History  Substance Use Topics  . Smoking status: Never Smoker   . Smokeless tobacco: Never Used  . Alcohol Use: Yes   OB History   Grav Para Term Preterm Abortions TAB SAB Ect Mult Living                 Review of Systems  Constitutional: Negative for fever and chills.  HENT: Positive for facial swelling. Negative for sore throat, trouble swallowing and voice change.   Respiratory: Negative for shortness of breath and wheezing.   Gastrointestinal: Negative for nausea, vomiting and abdominal pain.  Skin: Negative for rash.  All other systems reviewed and are negative.     Allergies  Review of patient's allergies indicates no known  allergies.  Home Medications   Prior to Admission medications   Medication Sig Start Date End Date Taking? Authorizing Provider  amLODipine-benazepril (LOTREL) 10-40 MG per capsule Take 1 capsule by mouth daily. 10/25/13   Lisabeth Pick, MD  aspirin-acetaminophen-caffeine (EXCEDRIN MIGRAINE) [id number] MG per tablet Take 1 tablet by mouth every 6 (six) hours as needed for headache.    Historical Provider, MD  atorvastatin (LIPITOR) 80 MG tablet Take 40 mg by mouth daily.     Historical Provider, MD  LORazepam (ATIVAN) 0.5 MG tablet Take 1 tablet (0.5 mg total) by mouth 2 (two) times daily as needed for anxiety. 03/08/13   Marletta Lor, MD  Multiple Vitamin (MULTIVITAMIN WITH MINERALS) TABS tablet Take 1 tablet by mouth daily.    Historical Provider, MD  naproxen sodium (ANAPROX) 220 MG tablet Take 220 mg by mouth 2 (two) times daily with a meal.    Historical Provider, MD  norgestimate-ethinyl estradiol (ORTHO-CYCLEN,SPRINTEC,PREVIFEM) 0.25-35 MG-MCG tablet Take 1 tablet by mouth daily.    Historical Provider, MD  Omega-3 Fatty Acids (FISH OIL PO) Take 1 capsule by mouth daily.    Historical Provider, MD  sertraline (ZOLOFT) 100 MG tablet Take 200 mg by mouth daily.     Historical Provider, MD   BP 175/105  Pulse 82  Temp(Src) 98.4 F (36.9 C) (Oral)  Resp 18  Ht 5\' 6"  (1.676 m)  Wt 225 lb (102.059 kg)  BMI 36.33 kg/m2  SpO2 99%  LMP 12/03/2013 Physical Exam  Nursing note and vitals reviewed. Constitutional: She is oriented to person, place, and time. She appears well-developed and well-nourished. No distress.  HENT:  Head: Normocephalic and atraumatic.  Mouth/Throat: Oropharynx is clear and moist. No oropharyngeal exudate.  Left upper lip angioedema with slightly more prominent on the right than the left. Patient has no intraoral swelling.  Eyes: EOM are normal. Pupils are equal, round, and reactive to light.  Neck: Normal range of motion. Neck supple.  Cardiovascular: Normal  rate and regular rhythm.   Pulmonary/Chest: Effort normal and breath sounds normal. No stridor. No respiratory distress. She has no wheezes. She has no rales.  Abdominal: Soft. Bowel sounds are normal. There is no tenderness. There is no rebound and no guarding.  Musculoskeletal: Normal range of motion. She exhibits no edema and no tenderness.  Lymphadenopathy:    She has no cervical adenopathy.  Neurological: She is alert and oriented to person, place, and time.  Moves all extremities without deficit. Sensation is intact.  Skin: Skin is warm and dry. No rash noted. No erythema.  Psychiatric: She has a normal mood and affect. Her behavior is normal.    ED Course  Procedures (including critical care time) Labs Review Labs Reviewed - No data to display  Imaging Review No results found.   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 We'll treat and observe in the emergency department. Patient's been advised to stop taking her benazepril. She'll need to followup with her primary Dr. regarding alternative hypertensive medication.  The patient has been observed in the emergency Department roughly 4 hours. Her lip swelling has improved. She's been given strict return precautions and is voiced understanding.  Julianne Rice, MD 01/12/14 [phone]

## 2014-01-12 NOTE — ED Notes (Signed)
Patient reassess. Swelling not better or worst.

## 2014-01-12 NOTE — Progress Notes (Signed)
Pre visit review using our clinic review tool, if applicable. No additional management support is needed unless otherwise documented below in the visit note. 

## 2014-01-12 NOTE — ED Notes (Signed)
MD IN ROOM

## 2014-01-12 NOTE — Progress Notes (Signed)
   Subjective:    Patient ID: [name], female    DOB: 02-Jan-1971, 43 y.o.   MRN: [id number]  HPI Here to follow up a diagnosis of angioedema when she saw the ER early this am. She had the sudden onset of swelling in the lips last night. No trouble breathing. She was given steroids and Benadryl, and the swelling has already gone down quite a bit. She has been on Lotrel for several years, but her dose was increase about 3 months ago. She was told to stop taking this of course. She now asks what to do about her BP.    Review of Systems  Constitutional: Negative.   Respiratory: Negative.   Cardiovascular: Negative.        Objective:   Physical Exam  Constitutional: She appears well-developed and well-nourished. No distress.  HENT:  Slight edema of the upper lip  Cardiovascular: Normal rate, regular rhythm, normal heart sounds and intact distal pulses.   Pulmonary/Chest: Effort normal and breath sounds normal.          Assessment & Plan:  Her angioedema is improving. She will avoid ACE inhibitors. Stay on 10 mg of amlodipine a day and add HCTZ. Recheck in 4 weeks

## 2014-01-14 ENCOUNTER — Telehealth (HOSPITAL_COMMUNITY): Payer: Self-pay | Admitting: Interventional Radiology

## 2014-01-14 ENCOUNTER — Other Ambulatory Visit (HOSPITAL_COMMUNITY): Payer: Self-pay | Admitting: Interventional Radiology

## 2014-01-14 DIAGNOSIS — I729 Aneurysm of unspecified site: Secondary | ICD-10-CM

## 2014-01-14 NOTE — Telephone Encounter (Signed)
Called pt's home and cell #'s, left VM on both for her to call to schedule her intervention. JM

## 2014-01-18 ENCOUNTER — Other Ambulatory Visit: Payer: Self-pay | Admitting: Radiology

## 2014-01-18 ENCOUNTER — Encounter (HOSPITAL_COMMUNITY): Payer: Self-pay | Admitting: Pharmacy Technician

## 2014-01-18 NOTE — Pre-Procedure Instructions (Signed)
[name]  01/18/2014   Your procedure is scheduled on:  Wed, May 27 @ 8:30 AM  Report to [facility]  at 6:30 AM.  Call this number if you have problems the morning of surgery: [phone]   Remember:   Do not eat food or drink liquids after midnight.   Take these medicines the morning of surgery with A SIP OF WATER: Amlodipine(Norvasc),Ativan(Lorazepam),Plavix(Clopidogrel),Omeprazole(Prilosec),and Zoloft(Sertraline)               Stop taking your Fish Oil and Aleve. No Goody's,BC's,Ibuprofen,or any Herbal Medications                 Do not wear jewelry, make-up or nail polish.  Do not wear lotions, powders, or perfumes. You may wear deodorant.  Do not shave 48 hours prior to surgery.   Do not bring valuables to the hospital.  [facility] is not responsible                  for any belongings or valuables.               Contacts, dentures or bridgework may not be worn into surgery.  Leave suitcase in the car. After surgery it may be brought to your room.  For patients admitted to the hospital, discharge time is determined by your                treatment team.               Patients discharged the day of surgery will not be allowed to drive  home.    Special Instructions:  [facility] - Preparing for Surgery  Before surgery, you can play an important role.  Because skin is not sterile, your skin needs to be as free of germs as possible.  You can reduce the number of germs on you skin by washing with CHG (chlorahexidine gluconate) soap before surgery.  CHG is an antiseptic cleaner which kills germs and bonds with the skin to continue killing germs even after washing.  Please DO NOT use if you have an allergy to CHG or antibacterial soaps.  If your skin becomes reddened/irritated stop using the CHG and inform your nurse when you arrive at Short Stay.  Do not shave (including legs and underarms) for at least 48 hours prior to the first CHG shower.  You may shave  your face.  Please follow these instructions carefully:   1.  Shower with CHG Soap the night before surgery and the                                morning of Surgery.  2.  If you choose to wash your hair, wash your hair first as usual with your       normal shampoo.  3.  After you shampoo, rinse your hair and body thoroughly to remove the                      Shampoo.  4.  Use CHG as you would any other liquid soap.  You can apply chg directly       to the skin and wash gently with scrungie or a clean washcloth.  5.  Apply the CHG Soap to your body ONLY FROM THE NECK DOWN.        Do not use on open wounds or  open sores.  Avoid contact with your eyes,       ears, mouth and genitals (private parts).  Wash genitals (private parts)       with your normal soap.  6.  Wash thoroughly, paying special attention to the area where your surgery        will be performed.  7.  Thoroughly rinse your body with warm water from the neck down.  8.  DO NOT shower/wash with your normal soap after using and rinsing off       the CHG Soap.  9.  Pat yourself dry with a clean towel.            10.  Wear clean pajamas.            11.  Place clean sheets on your bed the night of your first shower and do not        sleep with pets.  Day of Surgery  Do not apply any lotions/deoderants the morning of surgery.  Please wear clean clothes to the hospital/surgery center.     Please read over the following fact sheets that you were given: Pain Booklet, Coughing and Deep Breathing and Surgical Site Infection Prevention

## 2014-01-19 ENCOUNTER — Encounter (HOSPITAL_COMMUNITY)
Admission: RE | Admit: 2014-01-19 | Discharge: 2014-01-19 | Disposition: A | Discharge status: Home / Self Care | Payer: Commercial Managed Care - PPO | Source: Ambulatory Visit | Attending: Interventional Radiology | Admitting: Interventional Radiology

## 2014-01-19 ENCOUNTER — Encounter (HOSPITAL_COMMUNITY): Payer: Self-pay

## 2014-01-19 ENCOUNTER — Other Ambulatory Visit: Payer: Self-pay | Admitting: Radiology

## 2014-01-19 ENCOUNTER — Ambulatory Visit (HOSPITAL_COMMUNITY)
Admission: RE | Admit: 2014-01-19 | Discharge: 2014-01-19 | Disposition: A | Discharge status: Home / Self Care | Payer: Commercial Managed Care - PPO | Source: Ambulatory Visit | Attending: Anesthesiology | Admitting: Anesthesiology

## 2014-01-19 DIAGNOSIS — Z01818 Encounter for other preprocedural examination: Secondary | ICD-10-CM | POA: Insufficient documentation

## 2014-01-19 HISTORY — DX: Headache: R51

## 2014-01-19 HISTORY — DX: Nontraumatic subarachnoid hemorrhage, unspecified: I60.9

## 2014-01-19 LAB — CBC WITH DIFFERENTIAL/PLATELET
Basophils Absolute: 0 10*3/uL (ref 0.0–0.1)
Basophils Relative: 0 % (ref 0–1)
Eosinophils Absolute: 0.2 10*3/uL (ref 0.0–0.7)
Eosinophils Relative: 2 % (ref 0–5)
HCT: 42.2 % (ref 36.0–46.0)
Hemoglobin: 14.4 g/dL (ref 12.0–15.0)
Lymphocytes Relative: 23 % (ref 12–46)
Lymphs Abs: 2.6 10*3/uL (ref 0.7–4.0)
MCH: 30.9 pg (ref 26.0–34.0)
MCHC: 34.1 g/dL (ref 30.0–36.0)
MCV: 90.6 fL (ref 78.0–100.0)
Monocytes Absolute: 0.6 10*3/uL (ref 0.1–1.0)
Monocytes Relative: 6 % (ref 3–12)
Neutro Abs: 7.9 10*3/uL — ABNORMAL HIGH (ref 1.7–7.7)
Neutrophils Relative %: 69 % (ref 43–77)
Platelets: 306 10*3/uL (ref 150–400)
RBC: 4.66 MIL/uL (ref 3.87–5.11)
RDW: 12.1 % (ref 11.5–15.5)
WBC: 11.3 10*3/uL — ABNORMAL HIGH (ref 4.0–10.5)

## 2014-01-19 LAB — COMPREHENSIVE METABOLIC PANEL
ALT: 32 U/L (ref 0–35)
AST: 22 U/L (ref 0–37)
Albumin: 3.9 g/dL (ref 3.5–5.2)
Alkaline Phosphatase: 93 U/L (ref 39–117)
BUN: 17 mg/dL (ref 6–23)
CO2: 30 mEq/L (ref 19–32)
Calcium: 9.7 mg/dL (ref 8.4–10.5)
Chloride: 96 mEq/L (ref 96–112)
Creatinine, Ser: 0.97 mg/dL (ref 0.50–1.10)
GFR calc Af Amer: 84 mL/min — ABNORMAL LOW (ref >90)
GFR calc non Af Amer: 72 mL/min — ABNORMAL LOW (ref >90)
Glucose, Bld: 102 mg/dL — ABNORMAL HIGH (ref 70–99)
Potassium: 3.8 mEq/L (ref 3.7–5.3)
Sodium: 139 mEq/L (ref 137–147)
Total Bilirubin: 0.3 mg/dL (ref 0.3–1.2)
Total Protein: 7.6 g/dL (ref 6.0–8.3)

## 2014-01-19 LAB — PROTIME-INR
INR: 0.83 (ref 0.00–1.49)
Prothrombin Time: 11.3 seconds — ABNORMAL LOW (ref 11.6–15.2)

## 2014-01-19 LAB — HCG, SERUM, QUALITATIVE: Preg, Serum: NEGATIVE

## 2014-01-19 LAB — APTT: aPTT: 26 seconds (ref 24–37)

## 2014-01-19 MED ORDER — SODIUM CHLORIDE 0.9 % IV SOLN: Freq: Once | INTRAVENOUS | Status: DC

## 2014-01-20 ENCOUNTER — Encounter (HOSPITAL_COMMUNITY): Payer: Self-pay

## 2014-01-20 NOTE — Progress Notes (Signed)
Anesthesia Chart Review:  Patient is a 43 year old female scheduled for cerebral arteriogram with probable stnet placement at [facility] aneurysm on 01/26/14 by Dr. Estanislado Pandy. 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 ,   EKG on 01/19/14 showed NSR, incomplete right BBB, minimal voltage criteria for LVH.  Preoperative CXR and labs noted.  She is for p2y12 on the day of surgery. Dr. Estanislado Pandy already instructed her that she should start ASA 325 mg and Plavix 75 mg at least 5 days prior to the procedure.   If no acute changes then I anticipate that she could proceed as planned.  George Hugh [facility] Short Stay Center/Anesthesiology Phone [phone] 01/20/2014 3:03 PM

## 2014-01-21 ENCOUNTER — Other Ambulatory Visit: Payer: Self-pay | Admitting: Radiology

## 2014-01-25 ENCOUNTER — Other Ambulatory Visit: Payer: Self-pay | Admitting: Radiology

## 2014-01-26 ENCOUNTER — Encounter (HOSPITAL_COMMUNITY)
Admission: RE | Disposition: A | Discharge status: Home / Self Care | Payer: Self-pay | Source: Ambulatory Visit | Attending: Interventional Radiology

## 2014-01-26 ENCOUNTER — Encounter (HOSPITAL_COMMUNITY): Payer: Self-pay | Admitting: Anesthesiology

## 2014-01-26 ENCOUNTER — Inpatient Hospital Stay (HOSPITAL_COMMUNITY)
Admission: RE | Admit: 2014-01-26 | Discharge: 2014-01-27 | DRG: 027 | Disposition: A | Discharge status: Home / Self Care | Payer: Commercial Managed Care - PPO | Source: Ambulatory Visit | Attending: Interventional Radiology | Admitting: Interventional Radiology

## 2014-01-26 ENCOUNTER — Ambulatory Visit (HOSPITAL_COMMUNITY)
Admission: RE | Admit: 2014-01-26 | Discharge: 2014-01-26 | Disposition: A | Discharge status: Home / Self Care | Payer: Commercial Managed Care - PPO | Source: Ambulatory Visit | Attending: Interventional Radiology | Admitting: Interventional Radiology

## 2014-01-26 ENCOUNTER — Ambulatory Visit (HOSPITAL_COMMUNITY): Payer: Commercial Managed Care - PPO | Admitting: Anesthesiology

## 2014-01-26 ENCOUNTER — Encounter (HOSPITAL_COMMUNITY): Payer: Commercial Managed Care - PPO | Admitting: Vascular Surgery

## 2014-01-26 DIAGNOSIS — I729 Aneurysm of unspecified site: Secondary | ICD-10-CM

## 2014-01-26 DIAGNOSIS — E785 Hyperlipidemia, unspecified: Secondary | ICD-10-CM | POA: Diagnosis present

## 2014-01-26 DIAGNOSIS — Z7902 Long term (current) use of antithrombotics/antiplatelets: Secondary | ICD-10-CM

## 2014-01-26 DIAGNOSIS — I10 Essential (primary) hypertension: Secondary | ICD-10-CM | POA: Diagnosis present

## 2014-01-26 DIAGNOSIS — I671 Cerebral aneurysm, nonruptured: Principal | ICD-10-CM | POA: Diagnosis present

## 2014-01-26 DIAGNOSIS — Z7982 Long term (current) use of aspirin: Secondary | ICD-10-CM

## 2014-01-26 DIAGNOSIS — F3289 Other specified depressive episodes: Secondary | ICD-10-CM | POA: Diagnosis present

## 2014-01-26 DIAGNOSIS — Z79899 Other long term (current) drug therapy: Secondary | ICD-10-CM

## 2014-01-26 DIAGNOSIS — R51 Headache: Secondary | ICD-10-CM

## 2014-01-26 DIAGNOSIS — F411 Generalized anxiety disorder: Secondary | ICD-10-CM | POA: Diagnosis present

## 2014-01-26 DIAGNOSIS — F329 Major depressive disorder, single episode, unspecified: Secondary | ICD-10-CM | POA: Diagnosis present

## 2014-01-26 DIAGNOSIS — R519 Headache, unspecified: Secondary | ICD-10-CM

## 2014-01-26 HISTORY — PX: RADIOLOGY WITH ANESTHESIA: SHX6223

## 2014-01-26 LAB — CBC WITH DIFFERENTIAL/PLATELET
Basophils Absolute: 0 10*3/uL (ref 0.0–0.1)
Basophils Relative: 0 % (ref 0–1)
Eosinophils Absolute: 0.1 10*3/uL (ref 0.0–0.7)
Eosinophils Relative: 1 % (ref 0–5)
HCT: 39.9 % (ref 36.0–46.0)
Hemoglobin: 13.4 g/dL (ref 12.0–15.0)
Lymphocytes Relative: 21 % (ref 12–46)
Lymphs Abs: 1.8 10*3/uL (ref 0.7–4.0)
MCH: 30.7 pg (ref 26.0–34.0)
MCHC: 33.6 g/dL (ref 30.0–36.0)
MCV: 91.5 fL (ref 78.0–100.0)
Monocytes Absolute: 0.6 10*3/uL (ref 0.1–1.0)
Monocytes Relative: 7 % (ref 3–12)
Neutro Abs: 6.3 10*3/uL (ref 1.7–7.7)
Neutrophils Relative %: 71 % (ref 43–77)
Platelets: 263 10*3/uL (ref 150–400)
RBC: 4.36 MIL/uL (ref 3.87–5.11)
RDW: 12.1 % (ref 11.5–15.5)
WBC: 8.8 10*3/uL (ref 4.0–10.5)

## 2014-01-26 LAB — PLATELET INHIBITION P2Y12: Platelet Function  P2Y12: 227 [PRU] (ref 194–418)

## 2014-01-26 LAB — HEPARIN LEVEL (UNFRACTIONATED): Heparin Unfractionated: <0.1 IU/mL — ABNORMAL LOW (ref 0.30–0.70)

## 2014-01-26 LAB — POCT ACTIVATED CLOTTING TIME
Activated Clotting Time: 155 seconds
Activated Clotting Time: 149 seconds
Activated Clotting Time: 149 seconds

## 2014-01-26 LAB — MRSA PCR SCREENING: MRSA by PCR: NEGATIVE

## 2014-01-26 SURGERY — RADIOLOGY WITH ANESTHESIA
Anesthesia: Monitor Anesthesia Care

## 2014-01-26 MED ORDER — SODIUM CHLORIDE 0.9 % IV SOLN
INTRAVENOUS | Status: DC
  Administered 2014-01-26 – 2014-01-27 (×2): via INTRAVENOUS

## 2014-01-26 MED ORDER — ONDANSETRON HCL 4 MG/2ML IJ SOLN: 4.0000 mg | Freq: Four times a day (QID) | INTRAMUSCULAR | Status: DC | PRN

## 2014-01-26 MED ORDER — NIMODIPINE 30 MG PO CAPS
ORAL_CAPSULE | ORAL | Status: AC
  Administered 2014-01-26: 60 mg
  Filled 2014-01-26: qty 2

## 2014-01-26 MED ORDER — NICARDIPINE HCL IN NACL 20-0.86 MG/200ML-% IV SOLN: 5.0000 mg/h | INTRAVENOUS | Status: DC

## 2014-01-26 MED ORDER — FENTANYL CITRATE 0.05 MG/ML IJ SOLN
INTRAMUSCULAR | Status: DC | PRN
  Administered 2014-01-26: 50 ug via INTRAVENOUS
  Administered 2014-01-26: 100 ug via INTRAVENOUS
  Administered 2014-01-26 (×2): 50 ug via INTRAVENOUS

## 2014-01-26 MED ORDER — CLOPIDOGREL BISULFATE 75 MG PO TABS
ORAL_TABLET | ORAL | Status: AC
  Filled 2014-01-26: qty 1

## 2014-01-26 MED ORDER — ACETAMINOPHEN 500 MG PO TABS
1000.0000 mg | ORAL_TABLET | Freq: Four times a day (QID) | ORAL | Status: DC | PRN
  Administered 2014-01-26 – 2014-01-27 (×4): 1000 mg via ORAL
  Filled 2014-01-26 (×4): qty 2

## 2014-01-26 MED ORDER — DEXTROSE 5 % IV SOLN
10.0000 mg | INTRAVENOUS | Status: DC | PRN
  Administered 2014-01-26: 10 ug/min via INTRAVENOUS

## 2014-01-26 MED ORDER — NITROGLYCERIN 0.2 MG/ML ON CALL CATH LAB
INTRAVENOUS | Status: DC | PRN
  Administered 2014-01-26 (×2): 120 ug via INTRAVENOUS

## 2014-01-26 MED ORDER — NEOSTIGMINE METHYLSULFATE 10 MG/10ML IV SOLN
INTRAVENOUS | Status: DC | PRN
  Administered 2014-01-26: 3 mg via INTRAVENOUS

## 2014-01-26 MED ORDER — PROPOFOL 10 MG/ML IV BOLUS
INTRAVENOUS | Status: DC | PRN
  Administered 2014-01-26: 200 mg via INTRAVENOUS

## 2014-01-26 MED ORDER — KETOROLAC TROMETHAMINE 30 MG/ML IJ SOLN
30.0000 mg | Freq: Four times a day (QID) | INTRAMUSCULAR | Status: DC | PRN
  Administered 2014-01-26 – 2014-01-27 (×3): 30 mg via INTRAVENOUS
  Filled 2014-01-26 (×3): qty 1

## 2014-01-26 MED ORDER — CLOPIDOGREL BISULFATE 75 MG PO TABS
75.0000 mg | ORAL_TABLET | Freq: Every day | ORAL | Status: DC
  Administered 2014-01-27: 75 mg via ORAL
  Filled 2014-01-26 (×2): qty 1

## 2014-01-26 MED ORDER — HYDROMORPHONE HCL PF 1 MG/ML IJ SOLN
0.2500 mg | INTRAMUSCULAR | Status: DC | PRN
Start: 2014-01-26 — End: 2014-01-26

## 2014-01-26 MED ORDER — CEFAZOLIN SODIUM-DEXTROSE 2-3 GM-% IV SOLR: 2.0000 g | Freq: Once | INTRAVENOUS | Status: DC

## 2014-01-26 MED ORDER — NIMODIPINE 30 MG PO CAPS: 60.0000 mg | ORAL_CAPSULE | ORAL | Status: DC

## 2014-01-26 MED ORDER — SODIUM CHLORIDE 0.9 % IV SOLN: INTRAVENOUS | Status: DC

## 2014-01-26 MED ORDER — HEPARIN (PORCINE) IN NACL 100-0.45 UNIT/ML-% IJ SOLN
INTRAMUSCULAR | Status: AC
  Filled 2014-01-26: qty 250

## 2014-01-26 MED ORDER — GLYCOPYRROLATE 0.2 MG/ML IJ SOLN
INTRAMUSCULAR | Status: DC | PRN
  Administered 2014-01-26: .4 mg via INTRAVENOUS

## 2014-01-26 MED ORDER — LIDOCAINE HCL (CARDIAC) 20 MG/ML IV SOLN
INTRAVENOUS | Status: DC | PRN
  Administered 2014-01-26: 100 mg via INTRAVENOUS

## 2014-01-26 MED ORDER — LACTATED RINGERS IV SOLN
INTRAVENOUS | Status: DC | PRN
  Administered 2014-01-26 (×2): via INTRAVENOUS

## 2014-01-26 MED ORDER — NITROGLYCERIN 1 MG/10 ML FOR IR/CATH LAB
INTRA_ARTERIAL | Status: AC
  Filled 2014-01-26: qty 10

## 2014-01-26 MED ORDER — ACETAMINOPHEN 650 MG RE SUPP: 650.0000 mg | Freq: Four times a day (QID) | RECTAL | Status: DC | PRN

## 2014-01-26 MED ORDER — LACTATED RINGERS IV SOLN
INTRAVENOUS | Status: DC | PRN
  Administered 2014-01-26 (×2): via INTRAVENOUS

## 2014-01-26 MED ORDER — IOHEXOL 300 MG/ML  SOLN
125.0000 mL | Freq: Once | INTRAMUSCULAR | Status: AC | PRN
  Administered 2014-01-26: 80 mL via INTRAVENOUS

## 2014-01-26 MED ORDER — ASPIRIN EC 325 MG PO TBEC
325.0000 mg | DELAYED_RELEASE_TABLET | Freq: Once | ORAL | Status: AC
  Administered 2014-01-26: 325 mg via ORAL
  Filled 2014-01-26 (×2): qty 1

## 2014-01-26 MED ORDER — HEPARIN (PORCINE) IN NACL 100-0.45 UNIT/ML-% IJ SOLN
500.0000 [IU]/h | INTRAMUSCULAR | Status: DC
  Filled 2014-01-26: qty 250

## 2014-01-26 MED ORDER — HEPARIN SODIUM (PORCINE) 1000 UNIT/ML IJ SOLN
INTRAMUSCULAR | Status: DC | PRN
  Administered 2014-01-26 (×2): 500 [IU] via INTRAVENOUS
  Administered 2014-01-26: 3000 [IU] via INTRAVENOUS
  Administered 2014-01-26 (×3): 500 [IU] via INTRAVENOUS

## 2014-01-26 MED ORDER — ONDANSETRON HCL 4 MG/2ML IJ SOLN
INTRAMUSCULAR | Status: DC | PRN
  Administered 2014-01-26: 4 mg via INTRAVENOUS

## 2014-01-26 MED ORDER — LABETALOL HCL 5 MG/ML IV SOLN
INTRAVENOUS | Status: DC | PRN
  Administered 2014-01-26 (×2): 5 mg via INTRAVENOUS

## 2014-01-26 MED ORDER — HEPARIN (PORCINE) IN NACL 100-0.45 UNIT/ML-% IJ SOLN
1000.0000 [IU]/h | INTRAMUSCULAR | Status: AC
  Administered 2014-01-26: 1000 [IU]/h via INTRAVENOUS
  Administered 2014-01-26: 800 [IU]/h via INTRAVENOUS
  Filled 2014-01-26: qty 250

## 2014-01-26 MED ORDER — LIDOCAINE HCL 4 % MT SOLN
OROMUCOSAL | Status: DC | PRN
  Administered 2014-01-26: 4 mL via TOPICAL

## 2014-01-26 MED ORDER — ASPIRIN 325 MG PO TABS
325.0000 mg | ORAL_TABLET | Freq: Every day | ORAL | Status: DC
  Administered 2014-01-27: 325 mg via ORAL
  Filled 2014-01-26 (×2): qty 1

## 2014-01-26 MED ORDER — ONDANSETRON HCL 4 MG/2ML IJ SOLN
4.0000 mg | Freq: Once | INTRAMUSCULAR | Status: DC | PRN
Start: 2014-01-26 — End: 2014-01-26

## 2014-01-26 MED ORDER — ROCURONIUM BROMIDE 100 MG/10ML IV SOLN
INTRAVENOUS | Status: DC | PRN
  Administered 2014-01-26: 50 mg via INTRAVENOUS
  Administered 2014-01-26: 5 mg via INTRAVENOUS
  Administered 2014-01-26: 10 mg via INTRAVENOUS
  Administered 2014-01-26: 5 mg via INTRAVENOUS

## 2014-01-26 MED ORDER — CEFAZOLIN SODIUM-DEXTROSE 2-3 GM-% IV SOLR
INTRAVENOUS | Status: AC
  Administered 2014-01-26: 2 g via INTRAVENOUS
  Filled 2014-01-26: qty 50

## 2014-01-26 NOTE — Progress Notes (Signed)
ANTICOAGULATION CONSULT NOTE - follow up Pharmacy Consult for heparin  Indication: s/p placement of LVIS JR stent across neck of recanalized ACOM aneurysm   Allergies  Allergen Reactions  . Ace Inhibitors Swelling    Angioedema     Patient Measurements: Height: 5\' 6"  (167.6 cm) Weight: 223 lb (101.152 kg) IBW/kg (Calculated) : 59.3 Heparin Dosing Weight: 83kg  Vital Signs: Temp: 98.4 F (36.9 C) (05/27 0644) Temp src: Oral (05/27 0644) BP: 153/96 mmHg (05/27 0644) Pulse Rate: 76 (05/27 0644)  Labs:  Recent Labs  01/26/14 0725  HGB 13.4  HCT 39.9  PLT 263    Estimated Creatinine Clearance: 92.6 ml/min (by C-G formula based on Cr of 0.97).   Medical History: Past Medical History  Diagnosis Date  . PONV (postoperative nausea and vomiting)   . Hypertension   . Anxiety   . Depression     recent death of mother  . Hyperlipidemia   . Headache(784.0)   . SAH (subarachnoid hemorrhage)     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    Assessment: 43 year old female s/p placement of LVIS JR stent across neck of recanalized ACOM aneurysm in interventional radiology. Pharmacy consulted to start IV heparin per Deveshwar protocol. Heparin drip begun at low dose of 10 units/kg/hr based on adjusted body weight of 83kg at 1241.  HL about 6 hrs after drip started is undetectable.  No bleeding reported. Drip infusing well per RN report.   Goal of Therapy:  Target heparin level = 0.1-0.25 units/mL Monitor plt count daily   Plan:  1. Increase heparin drip to 1000 units/hr 2. Heparin to be off at 0700 per plans 3. F/u am CBC  Eudelia Bunch, Pharm.D. [phone] 01/26/2014 7:24 PM

## 2014-01-26 NOTE — Progress Notes (Signed)
Day of Surgery  Subjective: Pt c/o mild frontal HA; otherwise stable  Objective: Vital signs in last 24 hours: Temp:  [98.2 F (36.8 C)-98.5 F (36.9 C)] 98.2 F (36.8 C) (05/27 1300) Pulse Rate:  [52-76] 54 (05/27 1400) Resp:  [16-28] 20 (05/27 1400) BP: (99-153)/(55-96) 119/58 mmHg (05/27 1453) SpO2:  [95 %-100 %] 100 % (05/27 1400) Arterial Line BP: (129-133)/(55-60) 130/55 mmHg (05/27 1300) Weight:  [223 lb (101.152 kg)] 223 lb (101.152 kg) (05/27 0644)    Intake/Output from previous day:   Intake/Output this shift: Total I/O In: 1802.5 [I.V.:1802.5] Out: 550 [Urine:550]  Pt awake/alert; face symm, tongue midline, no drift, PERRLA/EOMI, speech nl, finger to nose and FMM wnl; strength 5/5 all fours; rt groin soft, mildy tender, no hematoma; intact distal pulses  Lab Results:   Recent Labs  01/26/14 0725  WBC 8.8  HGB 13.4  HCT 39.9  PLT 263   BMET No results found for this basename: NA, K, CL, CO2, GLUCOSE, BUN, CREATININE, CALCIUM,  in the last 72 hours PT/INR No results found for this basename: LABPROT, INR,  in the last 72 hours ABG No results found for this basename: PHART, PCO2, PO2, HCO3,  in the last 72 hours  Studies/Results: No results found.  Anti-infectives: Anti-infectives   Start     Dose/Rate Route Frequency Ordered Stop   01/26/14 0738  ceFAZolin (ANCEF) 2-3 GM-% IVPB SOLR    Comments:  Leandrew Koyanagi   : cabinet override      01/26/14 9201 01/26/14 0905      Assessment/Plan:  S/P Lt common carotid arteriogram followed by placement of LVIS JR stent across neck of recanalized ACOM aneurysm; for overnight obs; advance to clear liquid diet; cont heparin/ASA/plavix; neuro checks; check AM labs; f/u with Dr. Estanislado Pandy in [facility] clinic in 2 weeks; pt to remain on plavix for 2 weeks upon d/c and then stop - to remain on ASA .    LOS: 0 days    D Rowe Robert 01/26/2014

## 2014-01-26 NOTE — Anesthesia Preprocedure Evaluation (Signed)
Anesthesia Evaluation  Patient identified by MRN, date of birth, ID band Patient awake    Reviewed: Allergy & Precautions, H&P , NPO status , Patient's Chart, lab work & pertinent test results  History of Anesthesia Complications (+) PONV  Airway       Dental   Pulmonary          Cardiovascular hypertension, + Peripheral Vascular Disease     Neuro/Psych  Headaches,    GI/Hepatic   Endo/Other    Renal/GU      Musculoskeletal   Abdominal   Peds  Hematology   Anesthesia Other Findings   Reproductive/Obstetrics                           Anesthesia Physical Anesthesia Plan  ASA: III  Anesthesia Plan: MAC and General   Post-op Pain Management:    Induction: Intravenous  Airway Management Planned: Oral ETT  Additional Equipment: Arterial line  Intra-op Plan:   Post-operative Plan: Extubation in OR and Possible Post-op intubation/ventilation  Informed Consent: I have reviewed the patients History and Physical, chart, labs and discussed the procedure including the risks, benefits and alternatives for the proposed anesthesia with the patient or authorized representative who has indicated his/her understanding and acceptance.     Plan Discussed with:   Anesthesia Plan Comments:         Anesthesia Quick Evaluation

## 2014-01-26 NOTE — Progress Notes (Signed)
UR completed.  Gurdeep Keesey, RN BSN [facility] CCM Trauma/Neuro ICU Case Manager [phone]  

## 2014-01-26 NOTE — Progress Notes (Signed)
  Subjective: Post procedure .More awake alert . C/O a 5/10 bifrontal HA.Denies N/V ,visual ,speech ,motor or sensory  Symptoms. Handling  Water po without difficulty. Denies chest pains or breathing  difficulties..   Objective: Vital signs in last 24 hours: Temp:  [98.2 F (36.8 C)-98.5 F (36.9 C)] 98.2 F (36.8 C) (05/27 1300) Pulse Rate:  [52-76] 64 (05/27 1500) Resp:  [16-28] 18 (05/27 1500) BP: (99-153)/(55-96) 117/68 mmHg (05/27 1500) SpO2:  [95 %-100 %] 100 % (05/27 1500) Arterial Line BP: (129-133)/(55-60) 130/55 mmHg (05/27 1300) Weight:  [223 lb (101.152 kg)] 223 lb (101.152 kg) (05/27 0644)    Intake/Output from previous day:   Intake/Output this shift: Total I/O In: 1968.5 [I.V.:1968.5] Out: 550 [Urine:550]  On Examination. VS BP 119/64mmhg cuff.HR 60s to 70s SR. PaO2 100% 2ls O2 [place]. Neurologically. Alert, awake oriented to time ,place and space.Marland Kitchen  Speech and comprehension clear.Marland Kitchen  PEARLA..55mm RT=LT.  EOMS full..No nystagmus or  Subjective diplopia.  Visual Fields.Full.  No Facial asymmetry.  Tongue Midline..  Motor..           NO drift of outstretched arms..          Power.5/5 Proximally and distally all four extremities..  Fine motor and coordination to finger to nose equal..  Gait . Not tested  Romberg. Not tested.  Rt groin soft.No hematoma..  Pulses 2+  Lab Results:   Recent Labs  01/26/14 0725  WBC 8.8  HGB 13.4  HCT 39.9  PLT 263   BMET No results found for this basename: NA, K, CL, CO2, GLUCOSE, BUN, CREATININE, CALCIUM,  in the last 72 hours PT/INR No results found for this basename: LABPROT, INR,  in the last 72 hours ABG No results found for this basename: PHART, PCO2, PO2, HCO3,  in the last 72 hours  Studies/Results: No results found.  Anti-infectives: Anti-infectives   Start     Dose/Rate Route Frequency Ordered Stop   01/26/14 0715  ceFAZolin (ANCEF) IVPB 2 g/50 mL premix     2 g 100 mL/hr over 30  Minutes Intravenous  Once 01/26/14 6712        Assessment/Plan: s/p . Staged embolization of recanalized  ACOM aneurysm..  Plan. 1.Continue IV heparin and close neuro obs. 2 Control BP  Between 120mmhg  And 80mmhg by cuff. 3. Advance diet to clear liquids as tolerated. 4.D/W patient.  Rob Hickman 01/26/2014

## 2014-01-26 NOTE — Procedures (Signed)
S/P Lt common carotid arteriogram followed by placement of LVIS JR stent across neck of recanalized ACOM aneurysm

## 2014-01-26 NOTE — H&P (Signed)
[name] is an 43 y.o. female.   Chief Complaint: cerebral aneurysm HPI: Patient with history of persistent headaches and previously treated (endovascular coiling) ruptured anterior communicating artery region aneurysm 02/18/2013. Recent f/u cerebral arteriogram has demonstrated continued presence of a neck remnant of 3.6x 3.3 mm associated with the AComm aneurysm. She presents today for endovascular coiling vs stent assisted coiling of the AComm aneurysm neck remnant.                            Past Medical History  Diagnosis Date  . PONV (postoperative nausea and vomiting)   . Hypertension   . Anxiety   . Depression     recent death of mother  . Hyperlipidemia   . Headache(784.0)   . SAH (subarachnoid hemorrhage)     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    Past Surgical History  Procedure Laterality Date  . Myomectomy  2009  . Laparotomy    . Myomectomy  05/16/2011    Procedure: MYOMECTOMY;  Surgeon: Margarette Asal;  Location: [facility];  Service: Gynecology;  Laterality: N/A;  abdominal  . Radiology with anesthesia N/A 02/18/2013    Procedure: RADIOLOGY WITH ANESTHESIA;  Surgeon: Rob Hickman, MD;  Location: [place];  Service: Radiology;  Laterality: N/A;  . Brain surgery      Family History  Problem Relation Age of Onset  . Heart disease Mother 2    cad  . Lupus Mother   . COPD Mother   . Diabetes Father    Social History:  reports that she has never smoked. She has never used smokeless tobacco. She reports that she drinks alcohol. She reports that she does not use illicit drugs.  Allergies:  Allergies  Allergen Reactions  . Ace Inhibitors Swelling    Angioedema     Medications Prior to Admission  Medication Sig Dispense Refill  . amLODipine (NORVASC) 10 MG tablet Take 1 tablet (10 mg total) by mouth daily.  30 tablet  3  . aspirin-acetaminophen-caffeine (EXCEDRIN MIGRAINE) 161-096-04 MG per tablet Take 1 tablet by mouth every 6 (six) hours  as needed for headache.      Marland Kitchen atorvastatin (LIPITOR) 80 MG tablet Take 40 mg by mouth daily.       . clopidogrel (PLAVIX) 75 MG tablet Take 75 mg by mouth daily with breakfast.      . diphenhydrAMINE (BENADRYL) 25 MG tablet Take 2 tablets (50 mg total) by mouth every 4 (four) hours as needed for allergies.  20 tablet  0  . hydrochlorothiazide (HYDRODIURIL) 25 MG tablet Take 1 tablet (25 mg total) by mouth daily.  30 tablet  3  . LORazepam (ATIVAN) 0.5 MG tablet Take 1 tablet (0.5 mg total) by mouth 2 (two) times daily as needed for anxiety.  60 tablet  2  . Multiple Vitamin (MULTIVITAMIN WITH MINERALS) TABS tablet Take 1 tablet by mouth daily.      . naproxen sodium (ANAPROX) 220 MG tablet Take 220 mg by mouth 2 (two) times daily as needed.       . norgestimate-ethinyl estradiol (ORTHO-CYCLEN,SPRINTEC,PREVIFEM) 0.25-35 MG-MCG tablet Take 1 tablet by mouth daily.      . Omega-3 Fatty Acids (FISH OIL PO) Take 1 capsule by mouth daily.      Marland Kitchen omeprazole (PRILOSEC OTC) 20 MG tablet Take 20 mg by mouth daily.      . sertraline (ZOLOFT) 100 MG tablet  Take 200 mg by mouth daily.         Results for orders placed during the hospital encounter of 01/19/14  HCG, SERUM, QUALITATIVE      Result Value Ref Range   Preg, Serum NEGATIVE  NEGATIVE  APTT      Result Value Ref Range   aPTT 26  24 - 37 seconds  CBC WITH DIFFERENTIAL      Result Value Ref Range   WBC 11.3 (*) 4.0 - 10.5 K/uL   RBC 4.66  3.87 - 5.11 MIL/uL   Hemoglobin 14.4  12.0 - 15.0 g/dL   HCT 42.2  36.0 - 46.0 %   MCV 90.6  78.0 - 100.0 fL   MCH 30.9  26.0 - 34.0 pg   MCHC 34.1  30.0 - 36.0 g/dL   RDW 12.1  11.5 - 15.5 %   Platelets 306  150 - 400 K/uL   Neutrophils Relative % 69  43 - 77 %   Neutro Abs 7.9 (*) 1.7 - 7.7 K/uL   Lymphocytes Relative 23  12 - 46 %   Lymphs Abs 2.6  0.7 - 4.0 K/uL   Monocytes Relative 6  3 - 12 %   Monocytes Absolute 0.6  0.1 - 1.0 K/uL   Eosinophils Relative 2  0 - 5 %   Eosinophils Absolute 0.2   0.0 - 0.7 K/uL   Basophils Relative 0  0 - 1 %   Basophils Absolute 0.0  0.0 - 0.1 K/uL  COMPREHENSIVE METABOLIC PANEL      Result Value Ref Range   Sodium 139  137 - 147 mEq/L   Potassium 3.8  3.7 - 5.3 mEq/L   Chloride 96  96 - 112 mEq/L   CO2 30  19 - 32 mEq/L   Glucose, Bld 102 (*) 70 - 99 mg/dL   BUN 17  6 - 23 mg/dL   Creatinine, Ser 0.97  0.50 - 1.10 mg/dL   Calcium 9.7  8.4 - 10.5 mg/dL   Total Protein 7.6  6.0 - 8.3 g/dL   Albumin 3.9  3.5 - 5.2 g/dL   AST 22  0 - 37 U/L   ALT 32  0 - 35 U/L   Alkaline Phosphatase 93  39 - 117 U/L   Total Bilirubin 0.3  0.3 - 1.2 mg/dL   GFR calc non Af Amer 72 (*) >90 mL/min   GFR calc Af Amer 84 (*) >90 mL/min  PROTIME-INR      Result Value Ref Range   Prothrombin Time 11.3 (*) 11.6 - 15.2 seconds   INR 0.83  0.00 - 1.49  01/26/14 labs pending   Review of Systems  Constitutional: Negative for fever and chills.  HENT:       Occ HA's; occ sinus drainage  Eyes: Negative for blurred vision and double vision.  Respiratory: Negative for shortness of breath.        Occ dry cough  Cardiovascular: Negative for chest pain.  Gastrointestinal: Negative for nausea and vomiting.       Occ pelvic/menstrual cramping  Musculoskeletal: Negative for back pain.  Neurological: Negative for dizziness, sensory change, speech change, focal weakness, seizures and weakness.  Endo/Heme/Allergies: Does not bruise/bleed easily.    Blood pressure 153/96, pulse 76, temperature 98.4 F (36.9 C), temperature source Oral, resp. rate 18, height 5\' 6"  (1.676 m), weight 223 lb (101.152 kg), last menstrual period 01/03/2014, SpO2 99.00%. Physical Exam  Constitutional: She is oriented to  person, place, and time. She appears well-developed and well-nourished.  Cardiovascular: Normal rate and regular rhythm.   Respiratory: Effort normal and breath sounds normal.  GI: Soft. Bowel sounds are normal. There is no tenderness.  Musculoskeletal: Normal range of motion.   Neurological: She is alert and oriented to person, place, and time. No cranial nerve deficit. Coordination normal.  Skin: Skin is warm and dry.  Psychiatric: She has a normal mood and affect.     Assessment/Plan  Patient with history of persistent headaches and previously treated (endovascular coiling) ruptured anterior communicating artery region aneurysm 02/18/2013. Recent f/u cerebral arteriogram has demonstrated continued presence of a neck remnant of 3.6x 3.3 mm associated with the anterior communicating artery aneurysm. She presents today for endovascular coiling vs stent assisted coiling of the AComm aneurysm neck remnant. Details/risks of procedure d/w pt/husband with their understanding and consent. If intervention performed pt will be admitted to neuro ICU for overnight observation.  Crissie Sickles Zander Ingham 01/26/2014, 7:53 AM

## 2014-01-26 NOTE — Anesthesia Procedure Notes (Signed)
Procedure Name: Intubation Date/Time: 01/26/2014 8:59 AM Performed by: Izora Gala Pre-anesthesia Checklist: Patient identified, Emergency Drugs available, Suction available and Patient being monitored Patient Re-evaluated:Patient Re-evaluated prior to inductionOxygen Delivery Method: Circle system utilized Preoxygenation: Pre-oxygenation with 100% oxygen Intubation Type: IV induction Ventilation: Mask ventilation without difficulty Laryngoscope Size: Miller and 3 Grade View: Grade II Tube type: Oral Tube size: 7.5 mm Number of attempts: 2 (Good view but not as easily placed as appears) Airway Equipment and Method: Stylet Placement Confirmation: ETT inserted through vocal cords under direct vision and positive ETCO2 Secured at: 22 cm Tube secured with: Tape Dental Injury: Teeth and Oropharynx as per pre-operative assessment

## 2014-01-26 NOTE — Transfer of Care (Signed)
Immediate Anesthesia Transfer of Care Note  Patient: [name]  Procedure(s) Performed: Procedure(s): RADIOLOGY WITH ANESTHESIA (N/A)  Patient Location: PACU  Anesthesia Type:General  Level of Consciousness: awake, alert , oriented and patient cooperative  Airway & Oxygen Therapy: Patient Spontanous Breathing and Patient connected to face mask oxygen  Post-op Assessment: Report given to PACU RN, Post -op Vital signs reviewed and stable and Patient moving all extremities  Post vital signs: Reviewed and stable  Complications: No apparent anesthesia complications

## 2014-01-26 NOTE — Progress Notes (Addendum)
ANTICOAGULATION CONSULT NOTE - Initial Consult  Pharmacy Consult for heparin  Indication: s/p embolization of aneurysm   Allergies  Allergen Reactions  . Ace Inhibitors Swelling    Angioedema     Patient Measurements: Height: 5\' 6"  (167.6 cm) Weight: 223 lb (101.152 kg) IBW/kg (Calculated) : 59.3 Heparin Dosing Weight: 83kg  Vital Signs: Temp: 98.4 F (36.9 C) (05/27 0644) Temp src: Oral (05/27 0644) BP: 153/96 mmHg (05/27 0644) Pulse Rate: 76 (05/27 0644)  Labs:  Recent Labs  01/26/14 0725  HGB 13.4  HCT 39.9  PLT 263    Estimated Creatinine Clearance: 92.6 ml/min (by C-G formula based on Cr of 0.97).   Medical History: Past Medical History  Diagnosis Date  . PONV (postoperative nausea and vomiting)   . Hypertension   . Anxiety   . Depression     recent death of mother  . Hyperlipidemia   . Headache(784.0)   . SAH (subarachnoid hemorrhage)     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    Assessment: 44 year old female s/p embolization of aneurysm in interventional radiology. Pharmacy consulted to start IV heparin per Deveshwar protocol. Will begin at low dose of 10 units/kg/hr based on adjusted body weight of 83kg. Will order 6 hour hl and d/c gtt in am.  Goal of Therapy:  Target heparin level = 0.1-0.25 units/mL Monitor plt count daily    Plan:  Start heparin at 800 units/hr 6h HL then daily HL/CBC  Georgina Peer 01/26/2014,12:10 PM

## 2014-01-27 ENCOUNTER — Encounter (HOSPITAL_COMMUNITY): Payer: Self-pay | Admitting: Interventional Radiology

## 2014-01-27 ENCOUNTER — Telehealth: Payer: Self-pay | Admitting: Internal Medicine

## 2014-01-27 ENCOUNTER — Telehealth: Payer: Self-pay | Admitting: Radiology

## 2014-01-27 ENCOUNTER — Other Ambulatory Visit: Payer: Self-pay | Admitting: Radiology

## 2014-01-27 DIAGNOSIS — I671 Cerebral aneurysm, nonruptured: Secondary | ICD-10-CM

## 2014-01-27 LAB — BASIC METABOLIC PANEL
BUN: 7 mg/dL (ref 6–23)
CO2: 28 mEq/L (ref 19–32)
Calcium: 7.8 mg/dL — ABNORMAL LOW (ref 8.4–10.5)
Chloride: 105 mEq/L (ref 96–112)
Creatinine, Ser: 0.8 mg/dL (ref 0.50–1.10)
GFR calc Af Amer: >90 mL/min (ref >90)
GFR calc non Af Amer: >90 mL/min (ref >90)
Glucose, Bld: 107 mg/dL — ABNORMAL HIGH (ref 70–99)
Potassium: 3.1 mEq/L — ABNORMAL LOW (ref 3.7–5.3)
Sodium: 142 mEq/L (ref 137–147)

## 2014-01-27 LAB — CBC WITH DIFFERENTIAL/PLATELET
Basophils Absolute: 0 10*3/uL (ref 0.0–0.1)
Basophils Relative: 0 % (ref 0–1)
Eosinophils Absolute: 0.1 10*3/uL (ref 0.0–0.7)
Eosinophils Relative: 2 % (ref 0–5)
HCT: 31.8 % — ABNORMAL LOW (ref 36.0–46.0)
Hemoglobin: 10.5 g/dL — ABNORMAL LOW (ref 12.0–15.0)
Lymphocytes Relative: 32 % (ref 12–46)
Lymphs Abs: 2.3 10*3/uL (ref 0.7–4.0)
MCH: 30.8 pg (ref 26.0–34.0)
MCHC: 33 g/dL (ref 30.0–36.0)
MCV: 93.3 fL (ref 78.0–100.0)
Monocytes Absolute: 0.4 10*3/uL (ref 0.1–1.0)
Monocytes Relative: 5 % (ref 3–12)
Neutro Abs: 4.4 10*3/uL (ref 1.7–7.7)
Neutrophils Relative %: 61 % (ref 43–77)
Platelets: 196 10*3/uL (ref 150–400)
RBC: 3.41 MIL/uL — ABNORMAL LOW (ref 3.87–5.11)
RDW: 12.4 % (ref 11.5–15.5)
WBC: 7.2 10*3/uL (ref 4.0–10.5)

## 2014-01-27 LAB — PLATELET INHIBITION P2Y12: Platelet Function  P2Y12: 135 [PRU] — ABNORMAL LOW (ref 194–418)

## 2014-01-27 MED ORDER — ASPIRIN 325 MG PO TABS: 325.0000 mg | ORAL_TABLET | Freq: Every day | ORAL | Status: DC

## 2014-01-27 MED ORDER — HYDROCHLOROTHIAZIDE 25 MG PO TABS
25.0000 mg | ORAL_TABLET | Freq: Every day | ORAL | Status: DC
  Administered 2014-01-27: 25 mg via ORAL
  Filled 2014-01-27: qty 1

## 2014-01-27 MED ORDER — POTASSIUM CHLORIDE CRYS ER 20 MEQ PO TBCR
40.0000 meq | EXTENDED_RELEASE_TABLET | Freq: Once | ORAL | Status: AC
  Administered 2014-01-27: 40 meq via ORAL
  Filled 2014-01-27: qty 2

## 2014-01-27 NOTE — Anesthesia Postprocedure Evaluation (Signed)
  Anesthesia Post-op Note  Patient: [name]  Procedure(s) Performed: Procedure(s): RADIOLOGY WITH ANESTHESIA (N/A)  Patient Location: PACU  Anesthesia Type:General  Level of Consciousness: awake, alert , oriented and patient cooperative  Airway and Oxygen Therapy: Patient Spontanous Breathing  Post-op Pain: none  Post-op Assessment: Post-op Vital signs reviewed, Patient's Cardiovascular Status Stable, Respiratory Function Stable, Patent Airway, No signs of Nausea or vomiting and Pain level controlled  Post-op Vital Signs: stable  Last Vitals:  Filed Vitals:   01/27/14 0600  BP: 114/92  Pulse: 62  Temp:   Resp: 20    Complications: No apparent anesthesia complications

## 2014-01-27 NOTE — Discharge Summary (Signed)
Physician Discharge Summary  Patient ID: [name] MRN: [id number] DOB/AGE: Feb 06, 1971 43 y.o.  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 Recent f/u cerebral arteriogram has demonstrated continued presence of a neck remnant of 3.6x 3.3 mm associated with the AComm aneurysm. She presented 01/26/14 for endovascular coiling vs stent assisted coiling of the AComm aneurysm neck remnant.   She underwent left common carotid arteriogram followed by placement of LVIS JR stent across neck of recanalized ACOM aneurysm with general anesthesia with no immediate complications. She was admitted overnight to the neuro ICU for observation. She does have persistent frontal headache which has improved overnight and she rates it 3/10 this morning. She has maintained BP 419-379 systolic and 02'I diastolic. She is on aspirin 325 mg and plavix 75 mg daily without signs of bleeding and P2y12 was performed today. She denies any chest pain or shortness of breath. She denies any active bleeding or right groin site pain. She has ate breakfast without nausea or vomiting. She has had her foley catheter removed and has voided on her own. She has ambulated the halls without lightheadedness or dizziness. She denies any difficulty with speech, extremity weakness or vision changes. Her potassium was 3.1 today and she was given K-DUR 40 mEq PO today. She has been seen and examined by Dr. Estanislado Pandy today  and deemed stable for discharge home. She will continue all medications including aspirin 325 mg and plavix 75 mg daily. She will return to [facility] clinic in 2 weeks for follow-up. She was given the discharge instructions below and states understanding.   Consults: Anesthesia  Discharge Exam: Blood pressure 128/76, pulse 59, temperature 98.3 F (36.8 C), temperature source Oral, resp. rate 19, height 5\' 6"  (1.676 m), weight 223 lb (101.152 kg), last menstrual period 01/03/2014, SpO2 97.00%.  PE:  General : A&OX3, NAD, sitting up in chair Heart: RRR without M/G/R Lungs: CTA bilaterally, w/r/r Abd: Soft, NT, ND, (+) BS Ext: Right groin CFA access site, dressing C/D/I, soft, no signs of bleeding or hematoma, DP intact 2+ bilaterally Neuro: Grossly intact, smile symmetrical, tongue midline, able to whistle, EOMI without nystagmus, PERRLA, no ataxia, fine motor intact, speech clear, strength 5/5 upper and lower extremities equal  Disposition: 01-Home or Self Care  Discharge Instructions   Call MD for:  difficulty breathing, headache or visual disturbances    Complete by:  As directed      Call MD for:  extreme fatigue    Complete by:  As directed      Call MD for:  hives    Complete by:  As directed      Call MD for:  persistant dizziness or light-headedness    Complete by:  As directed      Call MD for:  persistant nausea and vomiting    Complete by:  As directed      Call MD for:  redness, tenderness, or signs of infection (pain, swelling, redness, odor or green/yellow discharge around incision site)    Complete by:  As directed  Call MD for:  severe uncontrolled pain    Complete by:  As directed      Call MD for:  temperature >100.4    Complete by:  As directed      Diet - low sodium heart healthy    Complete by:  As directed      Driving Restrictions    Complete by:  As directed   No driving x 2 weeks     Increase activity slowly    Complete by:  As directed      Lifting  restrictions    Complete by:  As directed   No lifting > 10 lbs x 1 week     Other Restrictions    Complete by:  As directed   No strenuous activity x 2 weeks     Remove dressing in 24 hours    Complete by:  As directed             Medication List    STOP taking these medications       aspirin-acetaminophen-caffeine 250-250-65 MG per tablet  Commonly known as:  EXCEDRIN MIGRAINE      TAKE these medications       amLODipine 10 MG tablet  Commonly known as:  NORVASC  Take 1 tablet (10 mg total) by mouth daily.     aspirin 325 MG tablet  Take 1 tablet (325 mg total) by mouth daily with breakfast.     atorvastatin 80 MG tablet  Commonly known as:  LIPITOR  Take 40 mg by mouth daily.     clopidogrel 75 MG tablet  Commonly known as:  PLAVIX  Take 75 mg by mouth daily with breakfast.     diphenhydrAMINE 25 MG tablet  Commonly known as:  BENADRYL  Take 2 tablets (50 mg total) by mouth every 4 (four) hours as needed for allergies.     FISH OIL PO  Take 1 capsule by mouth daily.     hydrochlorothiazide 25 MG tablet  Commonly known as:  HYDRODIURIL  Take 1 tablet (25 mg total) by mouth daily.     LORazepam 0.5 MG tablet  Commonly known as:  ATIVAN  Take 1 tablet (0.5 mg total) by mouth 2 (two) times daily as needed for anxiety.     multivitamin with minerals Tabs tablet  Take 1 tablet by mouth daily.     naproxen sodium 220 MG tablet  Commonly known as:  ANAPROX  Take 220 mg by mouth 2 (two) times daily as needed.     norgestimate-ethinyl estradiol 0.25-35 MG-MCG tablet  Commonly known as:  ORTHO-CYCLEN,SPRINTEC,PREVIFEM  Take 1 tablet by mouth daily.     omeprazole 20 MG tablet  Commonly known as:  PRILOSEC OTC  Take 20 mg by mouth daily.     sertraline 100 MG tablet  Commonly known as:  ZOLOFT  Take 200 mg by mouth daily.           Follow-up Information   Follow up with Rob Hickman, MD In 2 weeks. (Our office will call with the appointment  [phone])    Specialty:  Interventional Radiology   Contact information:   [address] [place] [address] [phone]       Signed: Hedy Jacob PA-C 01/27/2014, 12:50 PM

## 2014-01-27 NOTE — Progress Notes (Signed)
Pt being discharged home. Discontinued all IV's. Reviewed discharge instructions with patient and mother-in-law. Discharge instructions signed and placed in chart. Care plan and patient education complete. Answered all questions and patient verbalizes understanding.

## 2014-01-27 NOTE — Progress Notes (Signed)
Patient was discharged today from [facility] after left common carotid arteriogram followed by placement of LVIS JR stent across neck of recanalized ACOM aneurysm on 01/26/14. She called with new complaints of dime sized and smaller hives on both legs left greater in quantity than right and left arm with some oozing. She denies any neurological changes, shortness of breath or facial swelling. She states she just recently stopped her steroids last week after having angioedema from an ACEI. She has previously tolerated iodinated contrast without reaction. She has been on all her medications for awhile except HCTZ started 2 weeks ago and toradol given in the hospital yesterday and today. Upon discharge she did not notice these areas. Dr. Estanislado Pandy was informed and the patient was instructed to call her PCP. She does have benadryl at home and was instructed to take 25 mg. She was also instructed to go to the ED if she notices any signs and symptoms of worsening hives, facial swelling, palpitations, or shortness of breath. She states understanding of the above plan and is going to call her PCP after our discussion.   Tsosie Billing PA-C Interventional Radiology  01/27/2014 4:32 PM

## 2014-01-31 NOTE — Telephone Encounter (Signed)
[facility] Triage Call Report Triage Record Num: [id number] Operator: Leota Sauers Patient Name: [name] Call Date & Time: 01/27/2014 4:37:30PM Patient Phone: [phone] PCP: Darrick Penna. [facility]words Patient Gender: Female PCP Fax : [phone] Patient DOB: 09-05-1970 Practice Name: [facility] Reason for Call: Caller: [place]angeline/Patient; PCP: Phoebe Sharps (Adults only); CB#: [phone]; Call regarding Rash/Hives; on posterior left arm and leg. She reports they are hives. She was in hospital 01/26/14 and discharged on 01/27/14 after having stent in her head for aneurysm that ruptured last year. Dr.Tony [phone](nurse) [phone] at [facility]. is the interventional radiologist who inserted the stent; she was instructed to follow up with PCP. She was also advised she may take Benadryl and has had some resolution of itching with that and Hydrocortisone cream. Benadryl @ 16:30; Cortisone cream applied at 15:30. Per Hives guideline paged provider on call. Dr. Leanne Chang advised if she is getting better no further action is needed; may take Pepcid BID OTC if itching not resolved with Benadryl q6h. Caller informed and voiced understanding. She reports that no further hives have developed since the original call. Protocol(s) Used: Hives Recommended Outcome per Protocol: Call Provider within 4 Hours Reason for Outcome: First episode of hives with no other symptoms occurring within minutes to several hours after exposure to an allergen

## 2014-02-09 ENCOUNTER — Ambulatory Visit (HOSPITAL_COMMUNITY): Admit: 2014-02-09 | Payer: Commercial Managed Care - PPO

## 2014-02-09 ENCOUNTER — Ambulatory Visit (HOSPITAL_COMMUNITY)
Admission: RE | Admit: 2014-02-09 | Discharge: 2014-02-09 | Disposition: A | Discharge status: Home / Self Care | Payer: Commercial Managed Care - PPO | Source: Ambulatory Visit | Attending: Radiology | Admitting: Radiology

## 2014-02-09 DIAGNOSIS — E7800 Pure hypercholesterolemia, unspecified: Secondary | ICD-10-CM | POA: Insufficient documentation

## 2014-02-09 DIAGNOSIS — R030 Elevated blood-pressure reading, without diagnosis of hypertension: Secondary | ICD-10-CM | POA: Insufficient documentation

## 2014-02-09 DIAGNOSIS — F329 Major depressive disorder, single episode, unspecified: Secondary | ICD-10-CM | POA: Insufficient documentation

## 2014-02-09 DIAGNOSIS — R51 Headache: Secondary | ICD-10-CM | POA: Insufficient documentation

## 2014-02-09 DIAGNOSIS — Z9889 Other specified postprocedural states: Secondary | ICD-10-CM | POA: Insufficient documentation

## 2014-02-09 DIAGNOSIS — I671 Cerebral aneurysm, nonruptured: Secondary | ICD-10-CM | POA: Insufficient documentation

## 2014-02-09 DIAGNOSIS — F3289 Other specified depressive episodes: Secondary | ICD-10-CM | POA: Insufficient documentation

## 2014-02-09 DIAGNOSIS — Z09 Encounter for follow-up examination after completed treatment for conditions other than malignant neoplasm: Secondary | ICD-10-CM | POA: Insufficient documentation

## 2014-02-09 LAB — PLATELET INHIBITION P2Y12: Platelet Function  P2Y12: 5 [PRU] — ABNORMAL LOW (ref 194–418)

## 2014-02-10 ENCOUNTER — Telehealth (HOSPITAL_COMMUNITY): Payer: Self-pay | Admitting: Interventional Radiology

## 2014-02-10 NOTE — Telephone Encounter (Signed)
Called pt, told her to change her Aspirin from 325mg  1 qd to 81mg  1 qd. Also told her to change Plavix from 75mg  1 qd to 37.5mg  1 qd and recheck her P2Y12 on 02/23/14. Pt states understanding and is in agreement with this plan of care. JMichaux

## 2014-02-11 ENCOUNTER — Encounter: Payer: Commercial Managed Care - PPO | Admitting: *Deleted

## 2014-02-15 ENCOUNTER — Encounter: Payer: Commercial Managed Care - PPO | Admitting: *Deleted

## 2014-02-16 ENCOUNTER — Ambulatory Visit (INDEPENDENT_AMBULATORY_CARE_PROVIDER_SITE_OTHER): Payer: Commercial Managed Care - PPO | Admitting: *Deleted

## 2014-02-16 VITALS — BP 128/80

## 2014-02-16 DIAGNOSIS — I10 Essential (primary) hypertension: Secondary | ICD-10-CM

## 2014-02-22 ENCOUNTER — Other Ambulatory Visit: Payer: Self-pay | Admitting: Radiology

## 2014-02-22 NOTE — Progress Notes (Signed)
Late documentation..the patient comes in today 02/16/14 for a nurse visit blood pressure check.  She is taking amlodipine 10 mg once daily and hctz 25mg  once daily.  BP today was 128/80 takin by Vergia Alberts, CMA.  Pt was instructed to continue current medicines unless otherwise notified.  Unknown if she checks BP at home.  Note forwarded to Dr Leanne Chang for review

## 2014-02-23 ENCOUNTER — Telehealth (HOSPITAL_COMMUNITY): Payer: Self-pay | Admitting: Interventional Radiology

## 2014-02-23 ENCOUNTER — Ambulatory Visit (HOSPITAL_COMMUNITY)
Admission: RE | Admit: 2014-02-23 | Discharge: 2014-02-23 | Disposition: A | Discharge status: Home / Self Care | Payer: Commercial Managed Care - PPO | Source: Ambulatory Visit | Attending: Radiology | Admitting: Radiology

## 2014-02-23 DIAGNOSIS — Z0189 Encounter for other specified special examinations: Secondary | ICD-10-CM | POA: Insufficient documentation

## 2014-02-23 DIAGNOSIS — I671 Cerebral aneurysm, nonruptured: Secondary | ICD-10-CM | POA: Insufficient documentation

## 2014-02-23 LAB — PLATELET INHIBITION P2Y12: Platelet Function  P2Y12: 32 [PRU] — ABNORMAL LOW (ref 194–418)

## 2014-03-01 ENCOUNTER — Telehealth (HOSPITAL_COMMUNITY): Payer: Self-pay | Admitting: Interventional Radiology

## 2014-03-01 NOTE — Telephone Encounter (Signed)
Called pt, told her to continue on Aspirin 81mg  1 qd and Plavix 37.5mg  1 qd per Dr. Estanislado Pandy and that she would need a recheck P2Y12 on or about March 23, 2014. Pt states that she needs a refill of her Plavix, told her I would call that in to [place], [place] [phone]). She states understanding and is in agreement with this plan of care. JMichaux

## 2014-03-03 ENCOUNTER — Telehealth (HOSPITAL_COMMUNITY): Payer: Self-pay | Admitting: Interventional Radiology

## 2014-03-03 NOTE — Telephone Encounter (Signed)
Pt called and stated that she contacted her pharmacy ([facility]) and they told her that her prescription had not been called in. I had called this in last week but told her that I would call them back this morning to find out what the problem was. Called [facility] again this am, gave pharmacist a verbal order for the patient's refill of Plavix 75mg  1/2 tablet daily X30 tablets with 2 refills. JM

## 2014-04-20 ENCOUNTER — Other Ambulatory Visit: Payer: Self-pay | Admitting: Radiology

## 2014-04-21 ENCOUNTER — Other Ambulatory Visit: Payer: Self-pay | Admitting: Radiology

## 2014-04-21 LAB — PLATELET INHIBITION P2Y12: Platelet Function  P2Y12: 69 [PRU] — ABNORMAL LOW (ref 194–418)

## 2014-04-22 ENCOUNTER — Telehealth (HOSPITAL_COMMUNITY): Payer: Self-pay | Admitting: Interventional Radiology

## 2014-04-22 NOTE — Telephone Encounter (Signed)
Called pt, told her to continue as is on her Aspirin 81mg  1 qd and Plavix 37.5mg  1 qd as is per Deveshwar and she is due for 3 month f/u angio in September. She states understanding and is in agreement. JM

## 2014-05-04 ENCOUNTER — Encounter: Payer: Self-pay | Admitting: Nurse Practitioner

## 2014-05-04 ENCOUNTER — Ambulatory Visit (INDEPENDENT_AMBULATORY_CARE_PROVIDER_SITE_OTHER): Payer: Commercial Managed Care - PPO | Admitting: Nurse Practitioner

## 2014-05-04 ENCOUNTER — Other Ambulatory Visit: Payer: Self-pay | Admitting: Nurse Practitioner

## 2014-05-04 VITALS — BP 140/100 | HR 82 | Temp 97.0°F | Resp 18 | Ht 66.0 in | Wt 224.0 lb

## 2014-05-04 DIAGNOSIS — E785 Hyperlipidemia, unspecified: Secondary | ICD-10-CM | POA: Diagnosis not present

## 2014-05-04 DIAGNOSIS — I10 Essential (primary) hypertension: Secondary | ICD-10-CM

## 2014-05-04 DIAGNOSIS — Z23 Encounter for immunization: Secondary | ICD-10-CM

## 2014-05-04 DIAGNOSIS — Z6836 Body mass index (BMI) 36.0-36.9, adult: Secondary | ICD-10-CM

## 2014-05-04 DIAGNOSIS — D649 Anemia, unspecified: Secondary | ICD-10-CM | POA: Insufficient documentation

## 2014-05-04 DIAGNOSIS — R7989 Other specified abnormal findings of blood chemistry: Secondary | ICD-10-CM | POA: Insufficient documentation

## 2014-05-04 MED ORDER — ATORVASTATIN CALCIUM 40 MG PO TABS: 40.0000 mg | ORAL_TABLET | Freq: Every day | ORAL | Status: DC

## 2014-05-04 MED ORDER — PROPRANOLOL HCL ER 60 MG PO CP24: 60.0000 mg | ORAL_CAPSULE | Freq: Every day | ORAL | Status: DC

## 2014-05-04 NOTE — Assessment & Plan Note (Signed)
Cont med, refill today. Encourage 30 minutes exercise 5d/wk. Cut refined sugars & flours. Lipid panel today.

## 2014-05-04 NOTE — Assessment & Plan Note (Signed)
Last serum ca was low- right after aneurysm stent. Will check again & add PTH, vit D. Denies palpitations, muscle weakness.

## 2014-05-04 NOTE — Assessment & Plan Note (Addendum)
Recent intentional 20 lb wt loss in 6 mos, per pt. She increased activity & decreased fried foods, increased fruits & veggies.  Activity goal is 30 minutes 5 days/wk. Cut out refined sugars & flours. Breads, cereals, rice should have 4 gm fiber/serving. Eat 1 plate green food every day. F/u 4 weeks.

## 2014-05-04 NOTE — Progress Notes (Signed)
Pre visit review using our clinic review tool, if applicable. No additional management support is needed unless otherwise documented below in the visit note. 

## 2014-05-04 NOTE — Assessment & Plan Note (Signed)
Continue w/activity & diet changes. Start propranolol. Continue amlodipine & HCTZ. CMEt, urine microalb today.

## 2014-05-04 NOTE — Assessment & Plan Note (Signed)
Hx of anemia. 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 Still transient anemia. Iron studies & CBC today.

## 2014-05-04 NOTE — Progress Notes (Signed)
Subjective:     NAHDIA Dougherty is a 43 y.o. female presents to establish care She is concerned about elevated blood pressure. Dr Leanne Chang has historically been her provider. Last spring she had stent placed in brain aneurysms after coil failure. She had aneurysm rupture 1 yr ago. Of note, her mother had AVM also. Re HTN, she is not well controlled on current meds: amlodopine 10 mg & HCTZ 25 mg. She is exercising 4d/wk & has had recent 20 lb. Weight loss. She has an allergy to lisinopril. She is taking zoloft which can contribute to elevated bp. She sees Dr Caprice Beaver for treatment of depression. In reviewing recent labs, I am concerned about low calcium, potassium, & mild anemia. She denies palpitations, muscle cramps or weakness. She reports occasional HA for which she takes ibuprophen.  The following portions of the patient's history were reviewed and updated as appropriate: allergies, current medications, past family history, past medical history, past social history, past surgical history and problem list.  Review of Systems Pertinent items are noted in HPI.    Objective:    BP 140/100  Pulse 82  Temp(Src) 97 F (36.1 C) (Temporal)  Resp 18  Ht 5\' 6"  (1.676 m)  Wt 224 lb (101.606 kg)  BMI 36.17 kg/m2  SpO2 95% BP 140/100  Pulse 82  Temp(Src) 97 F (36.1 C) (Temporal)  Resp 18  Ht 5\' 6"  (1.676 m)  Wt 224 lb (101.606 kg)  BMI 36.17 kg/m2  SpO2 95% General appearance: alert, cooperative, appears stated age and no distress Head: Normocephalic, without obvious abnormality, atraumatic Eyes: negative findings: lids and lashes normal, conjunctivae and sclerae normal and pupils equal, round, reactive to light and accomodation Ears: normal TM's and external ear canals both ears Throat: lips, mucosa, and tongue normal; teeth and gums normal Neck: no adenopathy, no carotid bruit, supple, symmetrical, trachea midline and thyroid not enlarged, symmetric, no  tenderness/mass/nodules Lungs: clear to auscultation bilaterally Heart: regular rate and rhythm, S1, S2 normal, no murmur, click, rub or gallop Extremities: extremities normal, atraumatic, no cyanosis or edema    Assessment:     1. Low serum calcium - TSH - Thyroid peroxidase antibody - T4, free - PTH, intact and calcium - Vit D  25 hydroxy (rtn osteoporosis monitoring)  2. Essential hypertension, benign - Comprehensive metabolic panel - Microalbumin / creatinine urine ratio - propranolol ER (INDERAL LA) 60 MG 24 hr capsule; Take 1 capsule (60 mg total) by mouth at bedtime.  Dispense: 30 capsule; Refill: 1  3. Anemia, unspecified anemia type - CBC with Differential - Iron - Iron and TIBC  4. BMI 36.0-36.9,adult - Hemoglobin A1c - Lipid panel  5. Other and unspecified hyperlipidemia - Lipid panel  6. Need for prophylactic vaccination and inoculation against influenza - Flu Vaccine QUAD 36+ mos PF IM (Fluarix Quad PF)  See problem list for complete A&P See pt instructions. F/u 3 weeks.

## 2014-05-04 NOTE — Patient Instructions (Signed)
Start propranolol. Take at bedtime. Continue HCTZ & amlodopine. Take those in morning.  Great job with weight loss! Consider these additional changes:  Cut out refined sugar:anything that is sweet when you eat or drink it except fresh fruit. Cut out white bread, rolls, biscuits, bagels, muffins, pasta and cereals. Breads & cereals that have 4 gm or more of fiber per serving are good. Whole wheat pasta, brown rice and quinoa are good. Consider reading Eat to Live by Excell Seltzer and begin implementing principles.  Activity goal is 30 to 60 minutes 5 days/weekly.  Please record 3 blood pressures at home & bring with you when you return. When checking blood pressure, feet should be flat on floor, cuff at level of heart, sit for 5 minutes before taking reading. Call office if over 140/90 or under 100/60.  We will call you with lab results.  Please return in 3 weeks for evaluation of new med.  Nice to meet you!

## 2014-05-05 ENCOUNTER — Other Ambulatory Visit: Payer: Self-pay

## 2014-05-05 LAB — COMPREHENSIVE METABOLIC PANEL
ALT: 21 U/L (ref 0–35)
AST: 25 U/L (ref 0–37)
Albumin: 3.7 g/dL (ref 3.5–5.2)
Alkaline Phosphatase: 93 U/L (ref 39–117)
BUN: 16 mg/dL (ref 6–23)
CO2: 27 mEq/L (ref 19–32)
Calcium: 9.6 mg/dL (ref 8.4–10.5)
Chloride: 100 mEq/L (ref 96–112)
Creatinine, Ser: 0.9 mg/dL (ref 0.4–1.2)
GFR: 76.37 mL/min (ref >60.00)
Glucose, Bld: 92 mg/dL (ref 70–99)
Potassium: 3.1 mEq/L — ABNORMAL LOW (ref 3.5–5.1)
Sodium: 137 mEq/L (ref 135–145)
Total Bilirubin: 0.4 mg/dL (ref 0.2–1.2)
Total Protein: 7.3 g/dL (ref 6.0–8.3)

## 2014-05-05 LAB — CBC WITH DIFFERENTIAL/PLATELET
Basophils Absolute: 0 10*3/uL (ref 0.0–0.1)
Basophils Relative: 0.9 % (ref 0.0–3.0)
Eosinophils Absolute: 0 10*3/uL (ref 0.0–0.7)
Eosinophils Relative: 1.4 % (ref 0.0–5.0)
HCT: 39 % (ref 36.0–46.0)
Hemoglobin: 13.2 g/dL (ref 12.0–15.0)
Lymphocytes Relative: 64.3 % — ABNORMAL HIGH (ref 12.0–46.0)
Lymphs Abs: 1.5 10*3/uL (ref 0.7–4.0)
MCHC: 33.9 g/dL (ref 30.0–36.0)
MCV: 90.1 fl (ref 78.0–100.0)
Monocytes Absolute: 0.2 10*3/uL (ref 0.1–1.0)
Monocytes Relative: 10.5 % (ref 3.0–12.0)
Neutro Abs: 0.5 10*3/uL — ABNORMAL LOW (ref 1.4–7.7)
Neutrophils Relative %: 22.9 % — ABNORMAL LOW (ref 43.0–77.0)
Platelets: 318 10*3/uL (ref 150.0–400.0)
RBC: 4.33 Mil/uL (ref 3.87–5.11)
RDW: 12.3 % (ref 11.5–15.5)
WBC: 2.3 10*3/uL — ABNORMAL LOW (ref 4.0–10.5)

## 2014-05-05 LAB — PTH, INTACT AND CALCIUM
Calcium: 9.4 mg/dL (ref 8.4–10.5)
PTH: 483 pg/mL — ABNORMAL HIGH (ref 14–64)

## 2014-05-05 LAB — THYROID PEROXIDASE ANTIBODY: Thyroperoxidase Ab SerPl-aCnc: <1 IU/mL (ref <9)

## 2014-05-05 LAB — LIPID PANEL
Cholesterol: 220 mg/dL — ABNORMAL HIGH (ref 0–200)
HDL: 74.4 mg/dL (ref >39.00)
LDL Cholesterol: 109 mg/dL — ABNORMAL HIGH (ref 0–99)
NonHDL: 145.6
Total CHOL/HDL Ratio: 3
Triglycerides: 184 mg/dL — ABNORMAL HIGH (ref 0.0–149.0)
VLDL: 36.8 mg/dL (ref 0.0–40.0)

## 2014-05-05 LAB — IRON: Iron: 94 ug/dL (ref 42–145)

## 2014-05-05 LAB — TSH: TSH: 2.83 u[IU]/mL (ref 0.35–4.50)

## 2014-05-05 LAB — IRON AND TIBC
%SAT: 23 % (ref 20–55)
Iron: 92 ug/dL (ref 42–145)
TIBC: 405 ug/dL (ref 250–470)
UIBC: 313 ug/dL (ref 125–400)

## 2014-05-05 LAB — VITAMIN D 25 HYDROXY (VIT D DEFICIENCY, FRACTURES): VITD: 63.6 ng/mL (ref 30.00–100.00)

## 2014-05-05 LAB — T4, FREE: Free T4: 0.58 ng/dL — ABNORMAL LOW (ref 0.60–1.60)

## 2014-05-05 LAB — VITAMIN B12: Vitamin B-12: 374 pg/mL (ref 211–911)

## 2014-05-05 LAB — HEMOGLOBIN A1C: Hgb A1c MFr Bld: 5.5 % (ref 4.6–6.5)

## 2014-05-05 MED ORDER — AMLODIPINE BESYLATE 10 MG PO TABS
10.0000 mg | ORAL_TABLET | Freq: Every day | ORAL | Status: DC
Start: 2014-05-05 — End: 2014-09-12

## 2014-05-05 MED ORDER — HYDROCHLOROTHIAZIDE 25 MG PO TABS: 25.0000 mg | ORAL_TABLET | Freq: Every day | ORAL | Status: DC

## 2014-05-06 ENCOUNTER — Telehealth: Payer: Self-pay | Admitting: Nurse Practitioner

## 2014-05-06 DIAGNOSIS — E876 Hypokalemia: Secondary | ICD-10-CM

## 2014-05-06 DIAGNOSIS — R7989 Other specified abnormal findings of blood chemistry: Secondary | ICD-10-CM

## 2014-05-06 LAB — MICROALBUMIN / CREATININE URINE RATIO
Creatinine,U: 131.5 mg/dL
Microalb Creat Ratio: 0.8 mg/g (ref 0.0–30.0)
Microalb, Ur: 1 mg/dL (ref 0.0–1.9)

## 2014-05-06 MED ORDER — POTASSIUM CHLORIDE CRYS ER 10 MEQ PO TBCR: 10.0000 meq | EXTENDED_RELEASE_TABLET | Freq: Three times a day (TID) | ORAL | Status: DC

## 2014-05-06 NOTE — Telephone Encounter (Signed)
Spoke w/ pt regarding lab results. Answered all questions. PTH very high, Ca nml, vit D elevated-pt does not take D supplement, free T4 low, TSH nml, no thyroid ab. Ref to endo for eval & Tx. WBC 2.4: Repeat cbc & add ANA, ENA, RF in 3 weeks. Mother had SLE. Potassium low. Will prescribe supplement-likely due to HCTZ. Goal is to get better control of BP w/propranolol & amlodipine, then d/c HCTZ. F/u 3 weeks.

## 2014-05-26 ENCOUNTER — Other Ambulatory Visit: Payer: Self-pay | Admitting: Obstetrics and Gynecology

## 2014-05-26 ENCOUNTER — Encounter: Payer: Self-pay | Admitting: Nurse Practitioner

## 2014-05-26 ENCOUNTER — Ambulatory Visit (INDEPENDENT_AMBULATORY_CARE_PROVIDER_SITE_OTHER): Payer: Commercial Managed Care - PPO | Admitting: Nurse Practitioner

## 2014-05-26 ENCOUNTER — Ambulatory Visit (INDEPENDENT_AMBULATORY_CARE_PROVIDER_SITE_OTHER): Payer: Commercial Managed Care - PPO | Admitting: Internal Medicine

## 2014-05-26 ENCOUNTER — Encounter: Payer: Self-pay | Admitting: Internal Medicine

## 2014-05-26 VITALS — BP 131/77 | HR 73 | Temp 98.1°F | Resp 18 | Ht 66.0 in | Wt 223.0 lb

## 2014-05-26 VITALS — BP 124/80 | HR 67 | Temp 98.6°F | Resp 12 | Ht 66.0 in | Wt 224.0 lb

## 2014-05-26 DIAGNOSIS — E876 Hypokalemia: Secondary | ICD-10-CM

## 2014-05-26 DIAGNOSIS — I10 Essential (primary) hypertension: Secondary | ICD-10-CM

## 2014-05-26 DIAGNOSIS — R7989 Other specified abnormal findings of blood chemistry: Secondary | ICD-10-CM

## 2014-05-26 DIAGNOSIS — D72819 Decreased white blood cell count, unspecified: Secondary | ICD-10-CM | POA: Insufficient documentation

## 2014-05-26 LAB — URINALYSIS, MICROSCOPIC ONLY

## 2014-05-26 LAB — CBC WITH DIFFERENTIAL/PLATELET
Basophils Absolute: 0 10*3/uL (ref 0.0–0.1)
Basophils Relative: 0.4 % (ref 0.0–3.0)
Eosinophils Absolute: 0.1 10*3/uL (ref 0.0–0.7)
Eosinophils Relative: 1.1 % (ref 0.0–5.0)
HCT: 40.5 % (ref 36.0–46.0)
Hemoglobin: 13.5 g/dL (ref 12.0–15.0)
Lymphocytes Relative: 21.3 % (ref 12.0–46.0)
Lymphs Abs: 2.1 10*3/uL (ref 0.7–4.0)
MCHC: 33.3 g/dL (ref 30.0–36.0)
MCV: 91.5 fl (ref 78.0–100.0)
Monocytes Absolute: 0.5 10*3/uL (ref 0.1–1.0)
Monocytes Relative: 5.3 % (ref 3.0–12.0)
Neutro Abs: 7 10*3/uL (ref 1.4–7.7)
Neutrophils Relative %: 71.9 % (ref 43.0–77.0)
Platelets: 324 10*3/uL (ref 150.0–400.0)
RBC: 4.42 Mil/uL (ref 3.87–5.11)
RDW: 12.6 % (ref 11.5–15.5)
WBC: 9.7 10*3/uL (ref 4.0–10.5)

## 2014-05-26 LAB — BASIC METABOLIC PANEL
BUN: 20 mg/dL (ref 6–23)
CO2: 29 mEq/L (ref 19–32)
Calcium: 9.7 mg/dL (ref 8.4–10.5)
Chloride: 100 mEq/L (ref 96–112)
Creatinine, Ser: 1.1 mg/dL (ref 0.4–1.2)
GFR: 60.13 mL/min (ref >60.00)
Glucose, Bld: 107 mg/dL — ABNORMAL HIGH (ref 70–99)
Potassium: 3.9 mEq/L (ref 3.5–5.1)
Sodium: 136 mEq/L (ref 135–145)

## 2014-05-26 LAB — PHOSPHORUS: Phosphorus: 3.2 mg/dL (ref 2.3–4.6)

## 2014-05-26 LAB — MAGNESIUM: Magnesium: 2 mg/dL (ref 1.5–2.5)

## 2014-05-26 NOTE — Progress Notes (Signed)
Patient ID: [name], female   DOB: [birth year], 43 y.o.   MRN: [id number]   HPI  [name] is a 43 y.o.-year-old female, referred by her PCP, WEAVER, LAYNE C, NP , in consultation for hypocalcemia/hyperparathyroidism.  Pt had 2 instances of low calcium in 01/2013 and 12/2013. Outside these low values, her calcium levels have been normal. An  iPTH checked this mo was elevated.   I reviewed pt's pertinent labs: Lab Results  Component Value Date   PTH 483* 05/04/2014   CALCIUM 9.4 05/04/2014   CALCIUM 9.6 05/04/2014   CALCIUM 7.8* 01/27/2014   CALCIUM 9.7 01/19/2014   CALCIUM 9.4 12/17/2013   CALCIUM 9.1 10/22/2013   CALCIUM 9.0 06/11/2013   CALCIUM 8.9 03/01/2013   CALCIUM 7.7* 02/19/2013   CALCIUM 8.9 02/18/2013   No perioral numbness or hand cramping.  No h/o kidney stones.  No h/o CKD. Last BUN/Cr: Lab Results  Component Value Date   BUN 16 05/04/2014   CREATININE 0.9 05/04/2014   Pt is on HCTZ - started in 12/2013.  She took a Prednisone 5 day course in 12/2013.   No h/o vitamin D deficiency. Last vit D level was 63.6 in 05/04/2014.  Pt is on calcium and vitamin D from 1 tab MVI a day; she also eats dairy (milk or cheese) and green, leafy, vegetables (more salads 3-5x a week).  No h/o malabsorption, celiac disease, or h/o GBP. She has diverticulosis and IBS.  Pt does not have a FH calcium abnormalities, but mother had osteoporosis and kidney stones.   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 This year: stent and coiling in 12/2013 >> has an angiogram coming up.   ROS: Constitutional: no weight gain/loss, + fatigue, no subjective hyperthermia/hypothermia, + poor sleep Eyes: no blurry vision, no xerophthalmia ENT: no sore throat, no nodules palpated in throat, no dysphagia/odynophagia, no hoarseness Cardiovascular: no CP/SOB/palpitations/leg swelling Respiratory: no cough/SOB Gastrointestinal: no  N/V/D/C Musculoskeletal: no muscle/joint aches Skin: no rashes Neurological: no tremors/numbness/tingling/dizziness, + HA Psychiatric: no depression/anxiety  Past Medical History  Diagnosis Date  . PONV (postoperative nausea and vomiting)   . Hypertension   . Anxiety   . Depression     recent death of mother  . Hyperlipidemia   . Headache(784.0)   . SAH (subarachnoid hemorrhage)     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   Past Surgical History  Procedure Laterality Date  . Myomectomy  2009  . Laparotomy    . Myomectomy  05/16/2011    Procedure: MYOMECTOMY;  Surgeon: Margarette Asal;  Location: [facility];  Service: Gynecology;  Laterality: N/A;  abdominal  . Radiology with anesthesia N/A 02/18/2013    Procedure: RADIOLOGY WITH ANESTHESIA;  Surgeon: Rob Hickman, MD;  Location: [facility];  Service: Radiology;  Laterality: N/A;  . Brain surgery    . Radiology with anesthesia N/A 01/26/2014    Procedure: RADIOLOGY WITH ANESTHESIA;  Surgeon: Rob Hickman, MD;  Location: [facility] [facility];  Service: Radiology;  Laterality: N/A;   History   Social History  . Marital Status: Married    Spouse Name: N/A    Number of Children: 0   Occupational History  . Controller/accountant    Social History Main Topics  . Smoking status: Never Smoker   . Smokeless tobacco: Never Used  . Alcohol Use: Yes     Comment: occ - wine 1-2 glasses  1-2x a week  . Drug Use: No   Current Outpatient Prescriptions on File Prior to Visit  Medication Sig Dispense Refill  . amLODipine (NORVASC) 10 MG tablet Take 1 tablet (10 mg total) by mouth daily.  30 tablet  3  . aspirin 325 MG tablet Take 1 tablet (325 mg total) by mouth daily with breakfast.      . atorvastatin (LIPITOR) 40 MG tablet Take 1 tablet (40 mg total) by mouth daily.  30 tablet  3  . clopidogrel (PLAVIX) 75 MG tablet Take 75 mg by mouth daily with breakfast.      . diphenhydrAMINE (BENADRYL) 25 MG tablet Take 2 tablets (50 mg total) by  mouth every 4 (four) hours as needed for allergies.  20 tablet  0  . hydrochlorothiazide (HYDRODIURIL) 25 MG tablet Take 1 tablet (25 mg total) by mouth daily.  30 tablet  3  . LORazepam (ATIVAN) 0.5 MG tablet Take 1 tablet (0.5 mg total) by mouth 2 (two) times daily as needed for anxiety.  60 tablet  2  . Multiple Vitamin (MULTIVITAMIN WITH MINERALS) TABS tablet Take 1 tablet by mouth daily.      . naproxen sodium (ANAPROX) 220 MG tablet Take 220 mg by mouth 2 (two) times daily as needed.       . norgestimate-ethinyl estradiol (ORTHO-CYCLEN,SPRINTEC,PREVIFEM) 0.25-35 MG-MCG tablet Take 1 tablet by mouth daily.      . Omega-3 Fatty Acids (FISH OIL PO) Take 1 capsule by mouth daily.      Marland Kitchen omeprazole (PRILOSEC OTC) 20 MG tablet Take 20 mg by mouth daily.      . potassium chloride (K-DUR,KLOR-CON) 10 MEQ tablet Take 1 tablet (10 mEq total) by mouth 3 (three) times daily.  90 tablet  0  . propranolol ER (INDERAL LA) 60 MG 24 hr capsule Take 1 capsule (60 mg total) by mouth at bedtime.  30 capsule  1  . sertraline (ZOLOFT) 100 MG tablet Take 200 mg by mouth daily.        No current facility-administered medications on file prior to visit.   Allergies  Allergen Reactions  . Ace Inhibitors Swelling    Angioedema    Family History  Problem Relation Age of Onset  . Heart disease Mother 44    cad  . Lupus Mother   . COPD Mother   . Arthritis Mother     rheumatoid  . AVM Mother   . Diabetes Mother   . Diabetes Father   . Cancer Father     thyroid  . Depression Sister   . Diabetes Sister   . Hyperlipidemia Sister   . Hypertension Sister   . Cancer Paternal Grandfather     colon   PE: BP 124/80  Pulse 67  Temp(Src) 98.6 F (37 C) (Oral)  Resp 12  Ht 5\' 6"  (1.676 m)  Wt 224 lb (101.606 kg)  BMI 36.17 kg/m2  SpO2 97% Wt Readings from Last 3 Encounters:  05/26/14 224 lb (101.606 kg)  05/04/14 224 lb (101.606 kg)  01/26/14 223 lb (101.152 kg)   Constitutional: overweight, in  NAD Eyes: PERRLA, EOMI, no exophthalmos ENT: moist mucous membranes, no thyromegaly, no cervical lymphadenopathy Cardiovascular: RRR, No MRG Respiratory: CTA B Gastrointestinal: abdomen soft, NT, ND, BS+ Musculoskeletal: no deformities, strength intact in all 4; Chvostek sign negative bilat. Skin: moist, warm, no rashes Neurological: no tremor with outstretched hands, DTR normal in all 4  Assessment: 1. Hypocalcemia/hyperparathyroidism  Plan: Patient has  had 2 instances of low calcium over the last 1.5 years, with the lowest level being at 7.7. A recent intact PTH level was high, at 483 (calcium level then 9.4). No vitamin D deficiency. - No apparent signs/sxs from hypocalcemia: no perioral numbness, no acral cramping; Chvostek sign negative. - I discussed with the patient about the physiology of calcium and parathyroid hormone and I explained that a high PTH is expected in the setting of a low calcium, so this is unlikely a primary parathyroid problem. However, we need to find out why she has the low calcium levels. Possible etiologies:  Malabsorption (check celiac panel, recheck calcium and also check ionized calcium, check 24-hour urinary calcium)  Deficiency of vitamin D activation (she had normal 25 hydroxy vitamin D, will check 1,25-hydroxy vitamin D)  Resistance to vitamin D action (Will check 1,25-hydroxy vitamin D)  Hypomagnesemia (check magnesium level)  Hyperphosphatemia (check phosphorus level)  Fluoride excess (her house is on well water, so not treated)  History of surgery (however, no transfusion received) - I will check: Ionized and total calcium level intact PTH ([facility]) Magnesium Phosphorus vitamin D 1,25 HO  24h urinary calcium/creatinine ratio - pt given instructions for urine collection and the jug - ! Pt on HCTZ which may influence results.  I added celiac w/u but I have low suspicion of this in the setting of a normal vit D  Pt had recent normal TFTs -  will not repeat -  I will wait for the results of the above labs and will discuss with the plan with the patient.  - continue her MVI for now, but I advised her that we may need to increase her calcium supplementation with Ca citrate. - I will see the patient back in 6 months  Component     Latest Ref Rng 05/26/2014          Sodium     135 - 145 mEq/L 136  Potassium     3.5 - 5.1 mEq/L 3.9  Chloride     96 - 112 mEq/L 100  CO2     19 - 32 mEq/L 29  Glucose     70 - 99 mg/dL 107 (H)  BUN     6 - 23 mg/dL 20  Creatinine     0.4 - 1.2 mg/dL 1.1  Calcium     8.4 - 10.5 mg/dL 9.7  GFR     >60.00 mL/min 60.13  Vitamin D 1, 25 (OH) Total     18 - 72 pg/mL 79 (H)  Vitamin D3 1, 25 (OH)      79  Vitamin D2 1, 25 (OH)      <8  PTH     15 - 65 pg/mL 13 (L)  Gliadin IgG     <20 U/mL 5.0  Gliadin IgA     <20 U/mL 6.6  Reticulin Ab, IgA     NEGATIVE NEGATIVE  Reticulin IgA titer     <1:2.5 SEE NOTE  Calcium Ionized     1.12 - 1.32 mmol/L 1.31  Phosphorus     2.3 - 4.6 mg/dL 3.2  Tissue Transglutaminase Ab, IgA     <20 U/mL 7.2  Magnesium     1.5 - 2.5 mg/dL 2.0   Calcium normal. PTH a little low now, I am not sure which one was a lab error: the first reading (of 483) or the second (of 13). Mg and phos normal. Vit D 1,25 dihydroxy is a  little high. Celiac w/u is negative. Will await urine collection. If this is normal, will repeat PTH and Ca in 6 mo but no other intervention necessary for now.  06/01/2014 Urine calcium pending. I will addend the results when they become available.

## 2014-05-26 NOTE — Progress Notes (Signed)
Pre visit review using our clinic review tool, if applicable. No additional management support is needed unless otherwise documented below in the visit note. 

## 2014-05-26 NOTE — Patient Instructions (Signed)
Stop hydrochlorothiazide and potassium supplements.  Continue amlodipine & propranolol.  We will call with lab results.  Check blood pressure 3 times over 2 weeks & call me if over 140/90.  I will see you in 3 months or sooner if needed.

## 2014-05-26 NOTE — Progress Notes (Signed)
Subjective:     [name] is a 43 y.o. female presents to f/u on hypokalemia, HTN, leukopenia, and anemia. Blood pressure well controlled today. Last OV I added propranolol for better control: she is allergic to ACEI,; she is treated for anxiety, so I hoped propranolol would help. She says she has felt less anxious-used less ativan since starting propranolol. She has had some fatigue that is improving. BP is well controlled. She has been taking potassium supplments for hypokalemia-likely due to HCTZ. I will D/c HCTZ & potassium today.  I will repeat CBC and add ANA & ENA to r/o AID. Iron panel nml. She has felt well overall. She c/o og sinus pressure for last few days, denies fever, cough. She saw Dr Cruzita Lederer today for eval of high PTH. Labs pending.   The following portions of the patient's history were reviewed and updated as appropriate: allergies, current medications, past family history, past medical history, past social history, past surgical history and problem list.  Review of Systems Pertinent items are noted in HPI.    Objective:    BP 131/77  Pulse 73  Temp(Src) 98.1 F (36.7 C) (Oral)  Resp 18  Ht 5\' 6"  (1.676 m)  Wt 223 lb (101.152 kg)  BMI 36.01 kg/m2  SpO2 96% BP 131/77  Pulse 73  Temp(Src) 98.1 F (36.7 C) (Oral)  Resp 18  Ht 5\' 6"  (1.676 m)  Wt 223 lb (101.152 kg)  BMI 36.01 kg/m2  SpO2 96% General appearance: alert, cooperative, appears stated age and no distress Head: Normocephalic, without obvious abnormality, atraumatic Eyes: negative findings: lids and lashes normal, conjunctivae and sclerae normal, corneas clear and pupils equal, round, reactive to light and accomodation Ears: clear effusion RTM, bones visible, canal clear. Nml L ear exam. Throat: lips, mucosa, and tongue normal; teeth and gums normal Neck: no adenopathy, no carotid bruit, supple, symmetrical, trachea midline and thyroid not enlarged, symmetric, no  tenderness/mass/nodules Lungs: clear to auscultation bilaterally Heart: regular rate and rhythm, S1, S2 normal, no murmur, click, rub or gallop Extremities: extremities normal, atraumatic, no cyanosis or edema Pulses: 2+ and symmetric    Assessment:     1. Leukopenia Mother has SLE Iron panel nml. - CBC with Differential - Antinuclear Antibodies, IFA - ENA 9 Panel - Urine Microscopic  2. Hypokalemia Repeat bmet D/c HCTZ & potassium supplements  3. HYPERTENSION Continue propranolol & amlodipine.  F/u 3 mos.

## 2014-05-26 NOTE — Assessment & Plan Note (Signed)
Repeat CBC today No fever or illness Fam Hx SLE ANA by IFA & ENA today.

## 2014-05-26 NOTE — Assessment & Plan Note (Signed)
Better control since adding propranolol: Home readings 115-128/77-89. Using less ativan. D/c HCTZ & potassium supplements. Monitor BP at home  Over next 2 weeks. BMET today by Dr Adelina Mings check lytes. Continue propranolol & amlodipine.

## 2014-05-26 NOTE — Patient Instructions (Signed)
Please stop at the lab. Please collect the urine for calcium: Patient information (Up-to-Date): Collection of a 24-hour urine specimen  - You should collect every drop of urine during each 24-hour period. It does not matter how much or little urine is passed each time, as long as every drop is collected. - Begin the urine collection in the morning after you wake up, after you have emptied your bladder for the first time. - Urinate (empty the bladder) for the first time and flush it down the toilet. Note the exact time (eg, 6:15 AM). You will begin the urine collection at this time. - Collect every drop of urine during the day and night in an empty collection bottle. Store the bottle at room temperature or in the refrigerator. - If you need to have a bowel movement, any urine passed with the bowel movement should be collected. Try not to include feces with the urine collection. If feces does get mixed in, do not try to remove the feces from the urine collection bottle. - Finish by collecting the first urine passed the next morning, adding it to the collection bottle. This should be within ten minutes before or after the time of the first morning void on the first day (which was flushed). In this example, you would try to void between 6:05 and 6:25 on the second day. - If you need to urinate one hour before the final collection time, drink a full glass of water so that you can void again at the appropriate time. If you have to urinate 20 minutes before, try to hold the urine until the proper time. - Please note the exact time of the final collection, even if it is not the same time as when collection began on day 1. - The bottle(s) may be kept at room temperature for a day or two, but should be kept cool or refrigerated for longer periods of time. Please return in 6 months.

## 2014-05-27 ENCOUNTER — Telehealth: Payer: Self-pay | Admitting: Nurse Practitioner

## 2014-05-27 LAB — PARATHYROID HORMONE, INTACT (NO CA): PTH: 13 pg/mL — ABNORMAL LOW (ref 15–65)

## 2014-05-27 LAB — ENA 9 PANEL
Centromere Ab Screen: <1
ENA SM Ab Ser-aCnc: <1
Jo-1 Antibody, IgG: <1
Ribosomal P Protein Ab: <1
SM/RNP: <1
SSA (Ro) (ENA) Antibody, IgG: <1
SSB (La) (ENA) Antibody, IgG: <1
Scleroderma (Scl-70) (ENA) Antibody, IgG: <1
ds DNA Ab: <1 IU/mL

## 2014-05-27 LAB — RETICULIN ANTIBODIES, IGA W TITER: Reticulin Ab, IgA: NEGATIVE

## 2014-05-27 LAB — CYTOLOGY - PAP

## 2014-05-27 LAB — CALCIUM, IONIZED: Calcium, Ion: 1.31 mmol/L (ref 1.12–1.32)

## 2014-05-27 LAB — GLIADIN ANTIBODIES, SERUM
Gliadin IgA: 6.6 U/mL (ref <20)
Gliadin IgG: 5 U/mL (ref <20)

## 2014-05-27 LAB — TISSUE TRANSGLUTAMINASE, IGA: Tissue Transglutaminase Ab, IgA: 7.2 U/mL (ref <20)

## 2014-05-27 LAB — ANTINUCLEAR ANTIBODIES, IFA: ANA Titer 1: NEGATIVE

## 2014-05-27 NOTE — Telephone Encounter (Signed)
Potassium nml No evidence of AI disease. LM to cb

## 2014-05-29 LAB — VITAMIN D 1,25 DIHYDROXY
Vitamin D 1, 25 (OH)2 Total: 79 pg/mL — ABNORMAL HIGH (ref 18–72)
Vitamin D2 1, 25 (OH)2: <8 pg/mL
Vitamin D3 1, 25 (OH)2: 79 pg/mL

## 2014-05-30 ENCOUNTER — Other Ambulatory Visit (HOSPITAL_COMMUNITY): Payer: Self-pay | Admitting: Interventional Radiology

## 2014-05-30 DIAGNOSIS — I671 Cerebral aneurysm, nonruptured: Secondary | ICD-10-CM

## 2014-05-30 NOTE — Telephone Encounter (Signed)
pls call pt: Advise Potassium has normalized since stopping HCTZ. She can stop potassium supplements. Great news: All autoimmune screening labs are negative.

## 2014-05-30 NOTE — Telephone Encounter (Signed)
Spoke with pt, advised lab results. Pt understood. 

## 2014-05-30 NOTE — Telephone Encounter (Signed)
Patient returned your call. Please call her back on her cell

## 2014-06-03 ENCOUNTER — Other Ambulatory Visit: Payer: Self-pay | Admitting: Nurse Practitioner

## 2014-06-03 ENCOUNTER — Ambulatory Visit (INDEPENDENT_AMBULATORY_CARE_PROVIDER_SITE_OTHER): Payer: Commercial Managed Care - PPO | Admitting: Nurse Practitioner

## 2014-06-03 ENCOUNTER — Encounter: Payer: Self-pay | Admitting: Nurse Practitioner

## 2014-06-03 VITALS — BP 113/75 | HR 61 | Temp 98.0°F | Ht 66.0 in | Wt 229.0 lb

## 2014-06-03 DIAGNOSIS — J029 Acute pharyngitis, unspecified: Secondary | ICD-10-CM

## 2014-06-03 DIAGNOSIS — J028 Acute pharyngitis due to other specified organisms: Principal | ICD-10-CM

## 2014-06-03 DIAGNOSIS — B9789 Other viral agents as the cause of diseases classified elsewhere: Secondary | ICD-10-CM

## 2014-06-03 DIAGNOSIS — I10 Essential (primary) hypertension: Secondary | ICD-10-CM

## 2014-06-03 LAB — POCT RAPID STREP A (OFFICE): Rapid Strep A Screen: NEGATIVE

## 2014-06-03 NOTE — Addendum Note (Signed)
Addended by: Jannette Spanner on: 06/03/2014 03:33 PM   Modules accepted: Orders

## 2014-06-03 NOTE — Progress Notes (Signed)
Pre visit review using our clinic review tool, if applicable. No additional management support is needed unless otherwise documented below in the visit note. 

## 2014-06-03 NOTE — Patient Instructions (Signed)
Decrease amlodipine to 1/2 tab. Monitor blood pressure. If it gets above 140/90, resume whole tablet.  For sore throat, use benzocaine throat lozenge. Continue listerene gargles. We will call with culture results-should have it on Monday. We can call in prescription wherever you are, if needed.  Pharyngitis Pharyngitis is redness, pain, and swelling (inflammation) of your pharynx.  CAUSES  Pharyngitis is usually caused by infection. Most of the time, these infections are from viruses (viral) and are part of a cold. However, sometimes pharyngitis is caused by bacteria (bacterial). Pharyngitis can also be caused by allergies. Viral pharyngitis may be spread from person to person by coughing, sneezing, and personal items or utensils (cups, forks, spoons, toothbrushes). Bacterial pharyngitis may be spread from person to person by more intimate contact, such as kissing.  SIGNS AND SYMPTOMS  Symptoms of pharyngitis include:   Sore throat.   Tiredness (fatigue).   Low-grade fever.   Headache.  Joint pain and muscle aches.  Skin rashes.  Swollen lymph nodes.  Plaque-like film on throat or tonsils (often seen with bacterial pharyngitis). DIAGNOSIS  Your health care provider will ask you questions about your illness and your symptoms. Your medical history, along with a physical exam, is often all that is needed to diagnose pharyngitis. Sometimes, a rapid strep test is done. Other lab tests may also be done, depending on the suspected cause.  TREATMENT  Viral pharyngitis will usually get better in 3-4 days without the use of medicine. Bacterial pharyngitis is treated with medicines that kill germs (antibiotics).  HOME CARE INSTRUCTIONS   Drink enough water and fluids to keep your urine clear or pale yellow.   Only take over-the-counter or prescription medicines as directed by your health care provider:   If you are prescribed antibiotics, make sure you finish them even if you start to  feel better.   Do not take aspirin.   Get lots of rest.   Gargle with 8 oz of salt water ( tsp of salt per 1 qt of water) as often as every 1-2 hours to soothe your throat.   Throat lozenges (if you are not at risk for choking) or sprays may be used to soothe your throat. SEEK MEDICAL CARE IF:   You have large, tender lumps in your neck.  You have a rash.  You cough up green, yellow-brown, or bloody spit. SEEK IMMEDIATE MEDICAL CARE IF:   Your neck becomes stiff.  You drool or are unable to swallow liquids.  You vomit or are unable to keep medicines or liquids down.  You have severe pain that does not go away with the use of recommended medicines.  You have trouble breathing (not caused by a stuffy nose). MAKE SURE YOU:   Understand these instructions.  Will watch your condition.  Will get help right away if you are not doing well or get worse. Document Released: 08/19/2005 Document Revised: 06/09/2013 Document Reviewed: 04/26/2013 [facility] Patient Information 2015 [place], [place]. This information is not intended to replace advice given to you by your health care provider. Make sure you discuss any questions you have with your health care provider.

## 2014-06-03 NOTE — Assessment & Plan Note (Signed)
Decrease amlodipine to 1/2 T qd. Monitor BP. Resume whole tab if pressure over 140/90.

## 2014-06-03 NOTE — Addendum Note (Signed)
Addended by: Jannette Spanner on: 06/03/2014 02:46 PM   Modules accepted: Orders

## 2014-06-03 NOTE — Addendum Note (Signed)
Addended by: Jannette Spanner on: 06/03/2014 02:49 PM   Modules accepted: Orders

## 2014-06-03 NOTE — Progress Notes (Signed)
   Subjective:    Patient ID: [name], female    DOB: 1971-08-26, 43 y.o.   MRN: [id number]  Sore Throat  This is a new problem. The current episode started in the past 7 days. The problem has been gradually worsening. The pain is worse on the left side. Maximum temperature: 99.0. The pain is mild. Associated symptoms include congestion (mild, nasal), coughing (occasional) and headaches. Pertinent negatives include no abdominal pain, ear pain, hoarse voice, neck pain, shortness of breath or swollen glands. Treatments tried: coricidin. The treatment provided mild relief.      Review of Systems  Constitutional: Positive for fever. Negative for fatigue.  HENT: Positive for congestion (mild, nasal) and sore throat. Negative for ear pain, hoarse voice, sinus pressure and sneezing.   Respiratory: Positive for cough (occasional). Negative for chest tightness, shortness of breath and wheezing.   Gastrointestinal: Negative for abdominal pain.  Musculoskeletal: Negative for neck pain.  Neurological: Positive for headaches.       Objective:   Physical Exam  Vitals reviewed. Constitutional: She is oriented to person, place, and time. She appears well-developed and well-nourished. No distress.  HENT:  Head: Normocephalic and atraumatic.  Right Ear: External ear normal.  Left Ear: External ear normal.  Mouth/Throat: No oropharyngeal exudate.  Red splotces posterior pharynx& soft palpate- worse on L than R.  Eyes: Conjunctivae are normal. Right eye exhibits no discharge. Left eye exhibits no discharge.  Neck: Normal range of motion. Neck supple. No thyromegaly present.  Cardiovascular: Normal rate, regular rhythm and normal heart sounds.   No murmur heard. Pulmonary/Chest: Effort normal and breath sounds normal. No respiratory distress. She has no wheezes. She has no rales.  Lymphadenopathy:    She has no cervical adenopathy.  Neurological: She is alert and oriented to person,  place, and time.  Skin: Skin is warm and dry.  Psychiatric: She has a normal mood and affect. Her behavior is normal. Thought content normal.          Assessment & Plan:  1. Sore throat (viral) POC rapid strep-neg X 2 Upper resp culture pending. See pt instructions.  2. HTN Decrease amlodopine to 1/2 tab qd.  See pt instructions.

## 2014-06-06 ENCOUNTER — Other Ambulatory Visit: Payer: Self-pay | Admitting: *Deleted

## 2014-06-06 ENCOUNTER — Telehealth: Payer: Self-pay | Admitting: Nurse Practitioner

## 2014-06-06 LAB — CULTURE, UPPER RESPIRATORY

## 2014-06-06 MED ORDER — AMOXICILLIN-POT CLAVULANATE 875-125 MG PO TABS: 1.0000 | ORAL_TABLET | Freq: Two times a day (BID) | ORAL | Status: DC

## 2014-06-06 NOTE — Telephone Encounter (Signed)
emmi emailed °

## 2014-06-07 ENCOUNTER — Other Ambulatory Visit: Payer: Self-pay | Admitting: Nurse Practitioner

## 2014-06-07 DIAGNOSIS — J020 Streptococcal pharyngitis: Secondary | ICD-10-CM

## 2014-06-07 NOTE — Telephone Encounter (Signed)
Patient notified and rx sent to pharmacy. 

## 2014-06-10 ENCOUNTER — Telehealth: Payer: Self-pay

## 2014-06-10 NOTE — Telephone Encounter (Signed)
Pt called

## 2014-06-15 ENCOUNTER — Encounter: Payer: Self-pay | Admitting: Nurse Practitioner

## 2014-06-15 ENCOUNTER — Ambulatory Visit (INDEPENDENT_AMBULATORY_CARE_PROVIDER_SITE_OTHER): Payer: Commercial Managed Care - PPO | Admitting: Nurse Practitioner

## 2014-06-15 VITALS — BP 148/97 | HR 139 | Temp 98.1°F | Resp 18 | Ht 66.0 in | Wt 224.0 lb

## 2014-06-15 DIAGNOSIS — B9789 Other viral agents as the cause of diseases classified elsewhere: Secondary | ICD-10-CM

## 2014-06-15 DIAGNOSIS — J029 Acute pharyngitis, unspecified: Secondary | ICD-10-CM

## 2014-06-15 DIAGNOSIS — J028 Acute pharyngitis due to other specified organisms: Principal | ICD-10-CM

## 2014-06-15 NOTE — Assessment & Plan Note (Signed)
Better, but continues to have some throat discomfort. Exam is normal-petechiae have resolved, Nml exam. Continue antibiotic, sinus rinses, listerene gargles.

## 2014-06-15 NOTE — Progress Notes (Signed)
Pre visit review using our clinic review tool, if applicable. No additional management support is needed unless otherwise documented below in the visit note. 

## 2014-06-15 NOTE — Patient Instructions (Signed)
Your throat looks much better.  Finish the antibiotic. Continue sinus rinses & listerene gargles for several more days.   Let your doctor know you were treated for non-group A strep pharyngitis with 10 days augmentin, before your procedure next week.  Nice to see you!

## 2014-06-15 NOTE — Progress Notes (Signed)
Subjective:     [name] is a 43 y.o. female who presents for follow up of sore throat. She was seen in office about 10 days ago. Throat culture showed non-group A strep. She was started on augmentin 8 days ago. She has had 15 doses of antibiotic-3 pills remaining. She reports improvement in throat discomfort, but is concerned that symptoms have not completely resolved. She has had some runny nose & is using daily sinus rinses & gargling with listerene. She will have angiogram next week as f/u of stent placement.  The following portions of the patient's history were reviewed and updated as appropriate: allergies, current medications, past medical history, past social history, past surgical history and problem list.  Review of Systems Pertinent items are noted in HPI.    Objective:    BP 148/97  Pulse 139  Temp(Src) 98.1 F (36.7 C) (Oral)  Resp 18  Ht 5\' 6"  (1.676 m)  Wt 224 lb (101.606 kg)  BMI 36.17 kg/m2  SpO2 95% General appearance: alert, cooperative, appears stated age and no distress Head: Normocephalic, without obvious abnormality, atraumatic Eyes: negative findings: lids and lashes normal and conjunctivae and sclerae normal Ears: normal TM's and external ear canals both ears Throat: lips, mucosa, and tongue normal; teeth and gums normal Lymph nodes: no cervical or [place] LAD.    Assessment:  1. Strep pharyngitis, resolving Continue ABX, sinus rinse & listerene gargle Advise provider of infection before angiogram next week. F/u PRN

## 2014-06-20 ENCOUNTER — Other Ambulatory Visit: Payer: Self-pay | Admitting: Radiology

## 2014-06-21 ENCOUNTER — Other Ambulatory Visit: Payer: Self-pay | Admitting: Radiology

## 2014-06-22 ENCOUNTER — Other Ambulatory Visit: Payer: Self-pay | Admitting: Radiology

## 2014-06-22 ENCOUNTER — Encounter (HOSPITAL_COMMUNITY): Payer: Self-pay | Admitting: Pharmacy Technician

## 2014-06-23 ENCOUNTER — Other Ambulatory Visit: Payer: Self-pay | Admitting: Radiology

## 2014-06-24 ENCOUNTER — Other Ambulatory Visit (HOSPITAL_COMMUNITY): Payer: Self-pay | Admitting: Interventional Radiology

## 2014-06-24 ENCOUNTER — Ambulatory Visit (HOSPITAL_COMMUNITY)
Admission: RE | Admit: 2014-06-24 | Discharge: 2014-06-24 | Disposition: A | Discharge status: Home / Self Care | Payer: Commercial Managed Care - PPO | Source: Ambulatory Visit | Attending: Interventional Radiology | Admitting: Interventional Radiology

## 2014-06-24 ENCOUNTER — Encounter (HOSPITAL_COMMUNITY): Payer: Self-pay

## 2014-06-24 DIAGNOSIS — I671 Cerebral aneurysm, nonruptured: Secondary | ICD-10-CM | POA: Insufficient documentation

## 2014-06-24 DIAGNOSIS — Z7982 Long term (current) use of aspirin: Secondary | ICD-10-CM | POA: Diagnosis not present

## 2014-06-24 DIAGNOSIS — Z791 Long term (current) use of non-steroidal anti-inflammatories (NSAID): Secondary | ICD-10-CM | POA: Insufficient documentation

## 2014-06-24 DIAGNOSIS — I10 Essential (primary) hypertension: Secondary | ICD-10-CM | POA: Insufficient documentation

## 2014-06-24 DIAGNOSIS — Z48812 Encounter for surgical aftercare following surgery on the circulatory system: Secondary | ICD-10-CM | POA: Diagnosis present

## 2014-06-24 DIAGNOSIS — Z79899 Other long term (current) drug therapy: Secondary | ICD-10-CM | POA: Insufficient documentation

## 2014-06-24 DIAGNOSIS — Z7902 Long term (current) use of antithrombotics/antiplatelets: Secondary | ICD-10-CM | POA: Insufficient documentation

## 2014-06-24 LAB — BASIC METABOLIC PANEL
Anion gap: 14 (ref 5–15)
BUN: 18 mg/dL (ref 6–23)
CO2: 26 mEq/L (ref 19–32)
Calcium: 9.8 mg/dL (ref 8.4–10.5)
Chloride: 101 mEq/L (ref 96–112)
Creatinine, Ser: 0.92 mg/dL (ref 0.50–1.10)
GFR calc Af Amer: 89 mL/min — ABNORMAL LOW (ref >90)
GFR calc non Af Amer: 77 mL/min — ABNORMAL LOW (ref >90)
Glucose, Bld: 101 mg/dL — ABNORMAL HIGH (ref 70–99)
Potassium: 3.9 mEq/L (ref 3.7–5.3)
Sodium: 141 mEq/L (ref 137–147)

## 2014-06-24 LAB — CBC WITH DIFFERENTIAL/PLATELET
Basophils Absolute: 0 10*3/uL (ref 0.0–0.1)
Basophils Relative: 0 % (ref 0–1)
Eosinophils Absolute: 0.1 10*3/uL (ref 0.0–0.7)
Eosinophils Relative: 1 % (ref 0–5)
HCT: 38.5 % (ref 36.0–46.0)
Hemoglobin: 12.8 g/dL (ref 12.0–15.0)
Lymphocytes Relative: 22 % (ref 12–46)
Lymphs Abs: 1.8 10*3/uL (ref 0.7–4.0)
MCH: 30.3 pg (ref 26.0–34.0)
MCHC: 33.2 g/dL (ref 30.0–36.0)
MCV: 91 fL (ref 78.0–100.0)
Monocytes Absolute: 0.4 10*3/uL (ref 0.1–1.0)
Monocytes Relative: 5 % (ref 3–12)
Neutro Abs: 5.9 10*3/uL (ref 1.7–7.7)
Neutrophils Relative %: 72 % (ref 43–77)
Platelets: 263 10*3/uL (ref 150–400)
RBC: 4.23 MIL/uL (ref 3.87–5.11)
RDW: 12.3 % (ref 11.5–15.5)
WBC: 8.2 10*3/uL (ref 4.0–10.5)

## 2014-06-24 LAB — PROTIME-INR
INR: 0.95 (ref 0.00–1.49)
Prothrombin Time: 12.8 seconds (ref 11.6–15.2)

## 2014-06-24 LAB — APTT: aPTT: 29 seconds (ref 24–37)

## 2014-06-24 LAB — HCG, SERUM, QUALITATIVE: Preg, Serum: NEGATIVE

## 2014-06-24 MED ORDER — SODIUM CHLORIDE 0.9 % IV SOLN
Freq: Once | INTRAVENOUS | Status: AC
  Administered 2014-06-24: 08:00:00 via INTRAVENOUS

## 2014-06-24 MED ORDER — SODIUM CHLORIDE 0.9 % IV SOLN: INTRAVENOUS | Status: AC

## 2014-06-24 MED ORDER — MIDAZOLAM HCL 2 MG/2ML IJ SOLN
INTRAMUSCULAR | Status: AC | PRN
  Administered 2014-06-24: 1 mg via INTRAVENOUS

## 2014-06-24 MED ORDER — FENTANYL CITRATE 0.05 MG/ML IJ SOLN
INTRAMUSCULAR | Status: AC | PRN
  Administered 2014-06-24: 25 ug via INTRAVENOUS

## 2014-06-24 MED ORDER — LIDOCAINE HCL 1 % IJ SOLN
INTRAMUSCULAR | Status: AC
  Filled 2014-06-24: qty 20

## 2014-06-24 MED ORDER — FENTANYL CITRATE 0.05 MG/ML IJ SOLN
INTRAMUSCULAR | Status: AC
  Filled 2014-06-24: qty 2

## 2014-06-24 MED ORDER — HEPARIN SODIUM (PORCINE) 1000 UNIT/ML IJ SOLN
INTRAMUSCULAR | Status: AC
  Filled 2014-06-24: qty 1

## 2014-06-24 MED ORDER — HYDROCODONE-ACETAMINOPHEN 5-325 MG PO TABS: 1.0000 | ORAL_TABLET | Freq: Once | ORAL | Status: DC

## 2014-06-24 MED ORDER — HEPARIN SODIUM (PORCINE) 1000 UNIT/ML IJ SOLN
INTRAMUSCULAR | Status: AC | PRN
  Administered 2014-06-24: 1000 [IU] via INTRAVENOUS

## 2014-06-24 MED ORDER — MIDAZOLAM HCL 2 MG/2ML IJ SOLN
INTRAMUSCULAR | Status: AC
  Filled 2014-06-24: qty 2

## 2014-06-24 MED ORDER — HYDROCODONE-ACETAMINOPHEN 5-325 MG PO TABS
ORAL_TABLET | ORAL | Status: AC
  Filled 2014-06-24: qty 1

## 2014-06-24 MED ORDER — IOHEXOL 300 MG/ML  SOLN
150.0000 mL | Freq: Once | INTRAMUSCULAR | Status: AC | PRN
  Administered 2014-06-24: 25 mL via INTRA_ARTERIAL

## 2014-06-24 MED ORDER — HYDROCODONE-ACETAMINOPHEN 5-325 MG PO TABS
ORAL_TABLET | ORAL | Status: AC
  Administered 2014-06-24: 1
  Filled 2014-06-24: qty 1

## 2014-06-24 NOTE — Sedation Documentation (Addendum)
5 french sheath removed and exoseal deployed sucessfully. Gauze and tegaderm applied to site, no hematoma or bleeding noted.

## 2014-06-24 NOTE — Discharge Instructions (Signed)

## 2014-06-24 NOTE — Sedation Documentation (Signed)
Patient denies pain and is resting comfortably.  

## 2014-06-24 NOTE — Procedures (Signed)
S/P Lt common carotisd arteriogram. RT CFA approach . 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

## 2014-06-24 NOTE — H&P (Signed)
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 PONV (postoperative nausea and vomiting)   . Hypertension   . Anxiety   . Depression     recent death of mother  . Hyperlipidemia   . Headache(784.0)   . SAH (subarachnoid hemorrhage)     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    Past Surgical History  Procedure Laterality Date  . Myomectomy  2009  . Laparotomy    . Myomectomy  05/16/2011    Procedure: MYOMECTOMY;  Surgeon: Margarette Asal;  Location: [facility];  Service: Gynecology;  Laterality: N/A;  abdominal  . Radiology with anesthesia N/A 02/18/2013    Procedure: RADIOLOGY WITH ANESTHESIA;  Surgeon: Rob Hickman, MD;  Location: [facility];  Service: Radiology;  Laterality: N/A;  . Brain surgery    . Radiology with anesthesia N/A 01/26/2014    Procedure: RADIOLOGY WITH ANESTHESIA;  Surgeon: Rob Hickman, MD;  Location: [facility];  Service: Radiology;  Laterality: N/A;    Allergies: Ace inhibitors  Medications: Prior to Admission medications   Medication Sig Start Date End Date Taking? Authorizing Provider  amLODipine (NORVASC) 10 MG tablet Take 1 tablet (10 mg total) by mouth daily. 05/05/14  Yes Irene Pap, NP  aspirin EC 81 MG tablet Take 81 mg by mouth daily.   Yes Historical Provider, MD  atorvastatin (LIPITOR) 40 MG tablet Take 1 tablet (40 mg total) by mouth daily. 05/04/14  Yes Irene Pap, NP  benzocaine (ORAJEL) 10 % mucosal gel Use as directed 1 application in the mouth or throat as needed for mouth pain.   Yes Historical Provider, MD  clopidogrel (PLAVIX) 75 MG tablet Take 37.5 mg by mouth daily with  breakfast.    Yes Historical Provider, MD  diphenhydrAMINE (BENADRYL) 25 mg capsule Take 25-50 mg by mouth every 6 (six) hours as needed for itching or allergies.   Yes Historical Provider, MD  fluticasone (FLONASE) 50 MCG/ACT nasal spray Place 2 sprays into both nostrils daily as needed for allergies or rhinitis.   Yes Historical Provider, MD  LORazepam (ATIVAN) 0.5 MG tablet Take 1 tablet (0.5 mg total) by mouth 2 (two) times daily as needed for anxiety. 03/08/13  Yes Marletta Lor, MD  Multiple Vitamin (MULTIVITAMIN WITH MINERALS) TABS tablet Take 1 tablet by mouth daily.   Yes Historical Provider, MD  naproxen sodium (ANAPROX) 220 MG tablet Take 220 mg by mouth 2 (two) times daily as needed (for pain).    Yes Historical Provider, MD  norgestimate-ethinyl estradiol (ORTHO-CYCLEN,SPRINTEC,PREVIFEM) 0.25-35 MG-MCG tablet Take 1 tablet by mouth daily.   Yes Historical Provider, MD  Omega-3 Fatty Acids (FISH OIL PO) Take 1 capsule by mouth daily.   Yes Historical Provider, MD  omeprazole (PRILOSEC OTC) 20 MG tablet Take 20 mg by mouth daily as needed (for acid reflux).    Yes Historical Provider, MD  propranolol ER (INDERAL LA) 60 MG 24 hr capsule Take 1 capsule (60 mg total) by mouth at bedtime. 05/04/14  Yes Irene Pap, NP  sertraline (ZOLOFT) 100 MG tablet Take 200 mg by mouth daily.    Yes Historical Provider, MD  Family History  Problem Relation Age of Onset  . Heart disease Mother 7    cad  . Lupus Mother   . COPD Mother   . Arthritis Mother     rheumatoid  . AVM Mother   . Diabetes Mother   . Diabetes Father   . Cancer Father     thyroid  . Depression Sister   . Diabetes Sister   . Hyperlipidemia Sister   . Hypertension Sister   . Cancer Paternal Grandfather     colon    History   Social History  . Marital Status: Married    Spouse Name: N/A    Number of Children: 0  . Years of Education: N/A   Occupational History  .     Social History Main Topics  .  Smoking status: Never Smoker   . Smokeless tobacco: Never Used  . Alcohol Use: Yes     Comment: occ  . Drug Use: No  . Sexual Activity: None   Other Topics Concern  . None   Social History Narrative  . None    Review of Systems: A 12 point ROS discussed and pertinent positives are indicated in the HPI above.  All other systems are negative.  Review of Systems  Constitutional: Negative for fever, activity change and appetite change.  Respiratory: Negative for cough, chest tightness and shortness of breath.   Gastrointestinal: Negative for nausea, vomiting and abdominal pain.  Genitourinary: Negative for difficulty urinating.  Musculoskeletal: Negative for back pain and neck stiffness.  Skin: Negative for color change.  Neurological: Negative for dizziness, tremors, seizures, syncope, facial asymmetry, speech difficulty, weakness, light-headedness, numbness and headaches.  Psychiatric/Behavioral: Negative for behavioral problems and confusion.    Vital Signs: BP 132/91  Pulse 63  Temp(Src) 98.5 F (36.9 C) (Oral)  Resp 20  Ht 5\' 6"  (1.676 m)  Wt 102.059 kg (225 lb)  BMI 36.33 kg/m2  SpO2 99%  Physical Exam  Constitutional: She is oriented to person, place, and time. She appears well-nourished.  Cardiovascular: Normal rate, regular rhythm and normal heart sounds.   No murmur heard. Pulmonary/Chest: Effort normal and breath sounds normal. She has no wheezes.  Abdominal: Soft. Bowel sounds are normal. She exhibits no distension. There is no tenderness.  Musculoskeletal: Normal range of motion.  Neurological: She is alert and oriented to person, place, and time.  Skin: Skin is warm and dry.  Psychiatric: She has a normal mood and affect. Her behavior is normal. Thought content normal.    Imaging: No results found.  Labs:  CBC:  Recent Labs  01/26/14 0725 01/27/14 0500 05/04/14 1548 05/26/14 1408  WBC 8.8 7.2 2.3 Repeated and verified X2.* 9.7  HGB 13.4 10.5*  13.2 13.5  HCT 39.9 31.8* 39.0 40.5  PLT 263 196 318.0 324.0    COAGS:  Recent Labs  12/17/13 0639 01/19/14 0837  INR 1.01 0.83  APTT 29 26    BMP:  Recent Labs  12/17/13 0639 01/19/14 0837 01/27/14 0500 05/04/14 1548 05/04/14 1557 05/26/14 1001  NA 137 139 142 137  --  136  K 3.7 3.8 3.1* 3.1*  --  3.9  CL 99 96 105 100  --  100  CO2 23 30 28 27   --  29  GLUCOSE 102* 102* 107* 92  --  107*  BUN 15 17 7 16   --  20  CALCIUM 9.4 9.7 7.8* 9.6 9.4 9.7  CREATININE 0.97 0.97 0.80 0.9  --  1.1  GFRNONAA 72* 72* >90  --   --   --   GFRAA 84* 84* >90  --   --   --     LIVER FUNCTION TESTS:  Recent Labs  10/22/13 0931 01/19/14 0837 05/04/14 1548  BILITOT 0.5 0.3 0.4  AST 23 22 25   ALT 23 32 21  ALKPHOS 97 93 93  PROT 7.4 7.6 7.3  ALBUMIN 3.8 3.9 3.7    TUMOR MARKERS: No results found for this basename: AFPTM, CEA, CA199, CHROMGRNA,  in the last 8760 hours  Assessment and Plan:  Anterior communicating artery aneurysm rupture 2014 Coiling 03/2013 LVIS stent 12/2013 Now scheduled for follow up cerebral arteriogram Has no complaints Pt aware of procedure benefits and risks and agreeable to proceed Consent signed and in chart  Thank you for this interesting consult.  I greatly enjoyed meeting Kidron and look forward to participating in their care.    I spent a total of 20 minutes face to face in clinical consultation, greater than 50% of which was counseling/coordinating care for cerebral arteriogram  Signed: Sharone Picchi A 06/24/2014, 7:43 AM

## 2014-06-30 ENCOUNTER — Other Ambulatory Visit: Payer: Self-pay | Admitting: *Deleted

## 2014-06-30 ENCOUNTER — Other Ambulatory Visit (HOSPITAL_COMMUNITY): Payer: Self-pay | Admitting: Interventional Radiology

## 2014-06-30 DIAGNOSIS — I10 Essential (primary) hypertension: Secondary | ICD-10-CM

## 2014-06-30 MED ORDER — PROPRANOLOL HCL ER 60 MG PO CP24: 60.0000 mg | ORAL_CAPSULE | Freq: Every day | ORAL | Status: DC

## 2014-07-05 ENCOUNTER — Other Ambulatory Visit (HOSPITAL_COMMUNITY): Payer: Self-pay | Admitting: Interventional Radiology

## 2014-07-05 DIAGNOSIS — I729 Aneurysm of unspecified site: Secondary | ICD-10-CM

## 2014-07-14 ENCOUNTER — Other Ambulatory Visit: Payer: Self-pay | Admitting: Radiology

## 2014-07-18 ENCOUNTER — Other Ambulatory Visit (HOSPITAL_COMMUNITY): Payer: Self-pay | Admitting: *Deleted

## 2014-07-18 NOTE — Pre-Procedure Instructions (Signed)
[name]  07/18/2014   Your procedure is scheduled on:  Monday, July 25, 2014 at 8:15 AM.   Report to [facility] Entrance "A" Admitting Office  at 6:15 AM.   Call this number if you have problems the morning of surgery: [phone]               Any questions prior to day of surgery, please call [phone].    Remember:   Do not eat food or drink liquids after midnight Sunday, 07/24/14.   Take these medicines the morning of surgery with A SIP OF WATER: Amlodipine, sertraline (ZOLOFT), norgestimate-ethinyl estradiol (ORTHO-CYCLEN,SPRINTEC,PREVIFEM), omeprazole (PRILOSEC OTC) - if needed, LORazepam (ATIVAN) - if needed, fluticasone (FLONASE) - if needed.  Stop Vitamins and Fish Oil as of today.   Do not wear jewelry, make-up or nail polish.  Do not wear lotions, powders, or perfumes. You may wear deodorant.  Do not shave 48 hours prior to surgery.   Do not bring valuables to the hospital.  [facility] is not responsible                  for any belongings or valuables.               Contacts, dentures or bridgework may not be worn into surgery.  Leave suitcase in the car. After surgery it may be brought to your room.  For patients admitted to the hospital, discharge time is determined by your                treatment team.               Patients discharged the day of surgery will not be allowed to drive home.    Special Instructions: See Preparing for Surgery   Please read over the following fact sheets that you were given: Pain Booklet, Coughing and Deep Breathing and Surgical Site Infection Prevention

## 2014-07-19 ENCOUNTER — Inpatient Hospital Stay (HOSPITAL_COMMUNITY)
Admission: RE | Admit: 2014-07-19 | Discharge: 2014-07-19 | Disposition: A | Discharge status: Home / Self Care | Payer: Commercial Managed Care - PPO | Source: Ambulatory Visit

## 2014-07-21 ENCOUNTER — Other Ambulatory Visit: Payer: Self-pay | Admitting: Radiology

## 2014-07-22 ENCOUNTER — Encounter (HOSPITAL_COMMUNITY): Payer: Self-pay

## 2014-07-22 ENCOUNTER — Encounter (HOSPITAL_COMMUNITY)
Admission: RE | Admit: 2014-07-22 | Discharge: 2014-07-22 | Disposition: A | Discharge status: Home / Self Care | Payer: Commercial Managed Care - PPO | Source: Ambulatory Visit | Attending: Interventional Radiology | Admitting: Interventional Radiology

## 2014-07-22 ENCOUNTER — Other Ambulatory Visit: Payer: Self-pay | Admitting: Radiology

## 2014-07-22 HISTORY — DX: Unspecified osteoarthritis, unspecified site: M19.90

## 2014-07-22 HISTORY — DX: Insomnia, unspecified: G47.00

## 2014-07-22 HISTORY — DX: Gastro-esophageal reflux disease without esophagitis: K21.9

## 2014-07-22 LAB — COMPREHENSIVE METABOLIC PANEL
ALT: 20 U/L (ref 0–35)
AST: 19 U/L (ref 0–37)
Albumin: 3.8 g/dL (ref 3.5–5.2)
Alkaline Phosphatase: 88 U/L (ref 39–117)
Anion gap: 14 (ref 5–15)
BUN: 13 mg/dL (ref 6–23)
CO2: 26 mEq/L (ref 19–32)
Calcium: 9.5 mg/dL (ref 8.4–10.5)
Chloride: 99 mEq/L (ref 96–112)
Creatinine, Ser: 1 mg/dL (ref 0.50–1.10)
GFR calc Af Amer: 81 mL/min — ABNORMAL LOW (ref >90)
GFR calc non Af Amer: 69 mL/min — ABNORMAL LOW (ref >90)
Glucose, Bld: 117 mg/dL — ABNORMAL HIGH (ref 70–99)
Potassium: 3.5 mEq/L — ABNORMAL LOW (ref 3.7–5.3)
Sodium: 139 mEq/L (ref 137–147)
Total Bilirubin: 0.3 mg/dL (ref 0.3–1.2)
Total Protein: 7.7 g/dL (ref 6.0–8.3)

## 2014-07-22 LAB — PROTIME-INR
INR: 0.97 (ref 0.00–1.49)
Prothrombin Time: 13 seconds (ref 11.6–15.2)

## 2014-07-22 LAB — CBC WITH DIFFERENTIAL/PLATELET
Basophils Absolute: 0 10*3/uL (ref 0.0–0.1)
Basophils Relative: 0 % (ref 0–1)
Eosinophils Absolute: 0.1 10*3/uL (ref 0.0–0.7)
Eosinophils Relative: 1 % (ref 0–5)
HCT: 40.4 % (ref 36.0–46.0)
Hemoglobin: 13.5 g/dL (ref 12.0–15.0)
Lymphocytes Relative: 23 % (ref 12–46)
Lymphs Abs: 2.2 10*3/uL (ref 0.7–4.0)
MCH: 30.8 pg (ref 26.0–34.0)
MCHC: 33.4 g/dL (ref 30.0–36.0)
MCV: 92.2 fL (ref 78.0–100.0)
Monocytes Absolute: 0.4 10*3/uL (ref 0.1–1.0)
Monocytes Relative: 4 % (ref 3–12)
Neutro Abs: 7.1 10*3/uL (ref 1.7–7.7)
Neutrophils Relative %: 72 % (ref 43–77)
Platelets: 285 10*3/uL (ref 150–400)
RBC: 4.38 MIL/uL (ref 3.87–5.11)
RDW: 12 % (ref 11.5–15.5)
WBC: 9.8 10*3/uL (ref 4.0–10.5)

## 2014-07-22 LAB — APTT: aPTT: 28 seconds (ref 24–37)

## 2014-07-22 LAB — HCG, SERUM, QUALITATIVE: Preg, Serum: NEGATIVE

## 2014-07-22 NOTE — Pre-Procedure Instructions (Signed)
[name]  07/22/2014   Your procedure is scheduled on:  Mon, Nov 23 @ 8:15 AM  Report to [facility] Entrance A  at 6:15 AM.  Call this number if you have problems the morning of surgery: [phone]   Remember:   Do not eat food or drink liquids after midnight.   Take these medicines the morning of surgery with A SIP OF WATER: Amlodipine(Norvasc),Aspirin,Pain Pill(if needed),Lorazepam(Ativan),Omeprazole(Prilosec),Propranolol(Inderal),and Zoloft(Sertraline)               Stop taking your Ibuprofen,Fish Oil,Vitamins,or any Herbal Medications. No Goody's,BC's,or Aleve.   Do not wear jewelry, make-up or nail polish.  Do not wear lotions, powders, or perfumes. You may wear deodorant.  Do not shave 48 hours prior to surgery.   Do not bring valuables to the hospital.  [facility] is not responsible                  for any belongings or valuables.               Contacts, dentures or bridgework may not be worn into surgery.  Leave suitcase in the car. After surgery it may be brought to your room.  For patients admitted to the hospital, discharge time is determined by your                treatment team.               Patients discharged the day of surgery will not be allowed to drive  home.    Special Instructions:  [facility] - Preparing for Surgery  Before surgery, you can play an important role.  Because skin is not sterile, your skin needs to be as free of germs as possible.  You can reduce the number of germs on you skin by washing with CHG (chlorahexidine gluconate) soap before surgery.  CHG is an antiseptic cleaner which kills germs and bonds with the skin to continue killing germs even after washing.  Please DO NOT use if you have an allergy to CHG or antibacterial soaps.  If your skin becomes reddened/irritated stop using the CHG and inform your nurse when you arrive at Short Stay.  Do not shave (including legs and underarms) for at least 48 hours prior to the first CHG  shower.  You may shave your face.  Please follow these instructions carefully:   1.  Shower with CHG Soap the night before surgery and the                                morning of Surgery.  2.  If you choose to wash your hair, wash your hair first as usual with your       normal shampoo.  3.  After you shampoo, rinse your hair and body thoroughly to remove the                      Shampoo.  4.  Use CHG as you would any other liquid soap.  You can apply chg directly       to the skin and wash gently with scrungie or a clean washcloth.  5.  Apply the CHG Soap to your body ONLY FROM THE NECK DOWN.        Do not use on open wounds or open sores.  Avoid contact with your eyes,  ears, mouth and genitals (private parts).  Wash genitals (private parts)       with your normal soap.  6.  Wash thoroughly, paying special attention to the area where your surgery        will be performed.  7.  Thoroughly rinse your body with warm water from the neck down.  8.  DO NOT shower/wash with your normal soap after using and rinsing off       the CHG Soap.  9.  Pat yourself dry with a clean towel.            10.  Wear clean pajamas.            11.  Place clean sheets on your bed the night of your first shower and do not        sleep with pets.  Day of Surgery  Do not apply any lotions/deoderants the morning of surgery.  Please wear clean clothes to the hospital/surgery center.     Please read over the following fact sheets that you were given: Pain Booklet, Coughing and Deep Breathing and Surgical Site Infection Prevention

## 2014-07-22 NOTE — Progress Notes (Signed)
Verified with Jannifer [facility] PA that pt doesn't need P2Y12 and is not on Plavix

## 2014-07-22 NOTE — Progress Notes (Signed)
Pt doesn't have a cardiologist  Nicky Pugh NP with [facility]  Denies echo/stress test/heart cath  EKG and CXR in epic from 01-19-14

## 2014-07-25 ENCOUNTER — Encounter (HOSPITAL_COMMUNITY)
Admission: RE | Disposition: A | Discharge status: Home / Self Care | Payer: Self-pay | Source: Ambulatory Visit | Attending: Interventional Radiology

## 2014-07-25 ENCOUNTER — Ambulatory Visit (HOSPITAL_COMMUNITY): Payer: Commercial Managed Care - PPO | Admitting: Certified Registered Nurse Anesthetist

## 2014-07-25 ENCOUNTER — Encounter (HOSPITAL_COMMUNITY): Payer: Self-pay | Admitting: Certified Registered Nurse Anesthetist

## 2014-07-25 ENCOUNTER — Encounter (HOSPITAL_COMMUNITY): Payer: Self-pay

## 2014-07-25 ENCOUNTER — Inpatient Hospital Stay (HOSPITAL_COMMUNITY)
Admission: RE | Admit: 2014-07-25 | Discharge: 2014-07-26 | DRG: 027 | Disposition: A | Discharge status: Home / Self Care | Payer: Commercial Managed Care - PPO | Source: Ambulatory Visit | Attending: Interventional Radiology | Admitting: Interventional Radiology

## 2014-07-25 ENCOUNTER — Ambulatory Visit (HOSPITAL_COMMUNITY)
Admission: RE | Admit: 2014-07-25 | Discharge: 2014-07-25 | Disposition: A | Discharge status: Home / Self Care | Payer: Commercial Managed Care - PPO | Source: Ambulatory Visit | Attending: Interventional Radiology | Admitting: Interventional Radiology

## 2014-07-25 DIAGNOSIS — E785 Hyperlipidemia, unspecified: Secondary | ICD-10-CM | POA: Diagnosis present

## 2014-07-25 DIAGNOSIS — I729 Aneurysm of unspecified site: Secondary | ICD-10-CM

## 2014-07-25 DIAGNOSIS — Z6836 Body mass index (BMI) 36.0-36.9, adult: Secondary | ICD-10-CM

## 2014-07-25 DIAGNOSIS — Z7982 Long term (current) use of aspirin: Secondary | ICD-10-CM | POA: Diagnosis not present

## 2014-07-25 DIAGNOSIS — F419 Anxiety disorder, unspecified: Secondary | ICD-10-CM | POA: Diagnosis present

## 2014-07-25 DIAGNOSIS — I10 Essential (primary) hypertension: Secondary | ICD-10-CM

## 2014-07-25 DIAGNOSIS — K219 Gastro-esophageal reflux disease without esophagitis: Secondary | ICD-10-CM | POA: Diagnosis present

## 2014-07-25 DIAGNOSIS — M1990 Unspecified osteoarthritis, unspecified site: Secondary | ICD-10-CM | POA: Diagnosis present

## 2014-07-25 DIAGNOSIS — T783XXA Angioneurotic edema, initial encounter: Secondary | ICD-10-CM

## 2014-07-25 DIAGNOSIS — J020 Streptococcal pharyngitis: Secondary | ICD-10-CM

## 2014-07-25 DIAGNOSIS — F329 Major depressive disorder, single episode, unspecified: Secondary | ICD-10-CM | POA: Diagnosis present

## 2014-07-25 DIAGNOSIS — I671 Cerebral aneurysm, nonruptured: Principal | ICD-10-CM | POA: Diagnosis present

## 2014-07-25 DIAGNOSIS — D72819 Decreased white blood cell count, unspecified: Secondary | ICD-10-CM

## 2014-07-25 DIAGNOSIS — R7989 Other specified abnormal findings of blood chemistry: Secondary | ICD-10-CM

## 2014-07-25 DIAGNOSIS — Z79899 Other long term (current) drug therapy: Secondary | ICD-10-CM | POA: Diagnosis not present

## 2014-07-25 HISTORY — PX: RADIOLOGY WITH ANESTHESIA: SHX6223

## 2014-07-25 LAB — POCT ACTIVATED CLOTTING TIME
Activated Clotting Time: 123 seconds
Activated Clotting Time: 140 seconds
Activated Clotting Time: 118 seconds
Activated Clotting Time: 118 seconds

## 2014-07-25 LAB — MRSA PCR SCREENING: MRSA by PCR: NEGATIVE

## 2014-07-25 LAB — HEPARIN LEVEL (UNFRACTIONATED): Heparin Unfractionated: <0.1 IU/mL — ABNORMAL LOW (ref 0.30–0.70)

## 2014-07-25 SURGERY — RADIOLOGY WITH ANESTHESIA
Anesthesia: General

## 2014-07-25 MED ORDER — ASPIRIN EC 325 MG PO TBEC
325.0000 mg | DELAYED_RELEASE_TABLET | ORAL | Status: DC
  Filled 2014-07-25: qty 1

## 2014-07-25 MED ORDER — LACTATED RINGERS IV SOLN
INTRAVENOUS | Status: DC
  Administered 2014-07-25 (×3): via INTRAVENOUS

## 2014-07-25 MED ORDER — CLOPIDOGREL BISULFATE 300 MG PO TABS
300.0000 mg | ORAL_TABLET | Freq: Once | ORAL | Status: AC
  Administered 2014-07-25: 300 mg via ORAL
  Filled 2014-07-25: qty 1

## 2014-07-25 MED ORDER — LIDOCAINE HCL 1 % IJ SOLN
INTRAMUSCULAR | Status: AC
  Filled 2014-07-25: qty 20

## 2014-07-25 MED ORDER — NIMODIPINE 30 MG PO CAPS
60.0000 mg | ORAL_CAPSULE | ORAL | Status: DC
  Filled 2014-07-25: qty 2

## 2014-07-25 MED ORDER — CEFAZOLIN SODIUM-DEXTROSE 2-3 GM-% IV SOLR
2.0000 g | Freq: Once | INTRAVENOUS | Status: AC
  Administered 2014-07-25: 2 g via INTRAVENOUS
  Filled 2014-07-25: qty 50

## 2014-07-25 MED ORDER — HYDROCODONE-ACETAMINOPHEN 5-325 MG PO TABS
1.0000 | ORAL_TABLET | ORAL | Status: DC | PRN
  Administered 2014-07-25 – 2014-07-26 (×3): 1 via ORAL
  Filled 2014-07-25 (×3): qty 1

## 2014-07-25 MED ORDER — CLOPIDOGREL BISULFATE 75 MG PO TABS
75.0000 mg | ORAL_TABLET | Freq: Every day | ORAL | Status: DC
  Administered 2014-07-26: 75 mg via ORAL
  Filled 2014-07-25 (×2): qty 1

## 2014-07-25 MED ORDER — SODIUM CHLORIDE 0.9 % IV SOLN
INTRAVENOUS | Status: DC
  Administered 2014-07-25 – 2014-07-26 (×2): via INTRAVENOUS

## 2014-07-25 MED ORDER — LORAZEPAM 0.5 MG PO TABS
0.5000 mg | ORAL_TABLET | Freq: Two times a day (BID) | ORAL | Status: DC | PRN
  Administered 2014-07-25: 0.5 mg via ORAL
  Filled 2014-07-25: qty 1

## 2014-07-25 MED ORDER — NIMODIPINE 30 MG PO CAPS
ORAL_CAPSULE | ORAL | Status: AC
  Filled 2014-07-25: qty 2

## 2014-07-25 MED ORDER — HEPARIN (PORCINE) IN NACL 100-0.45 UNIT/ML-% IJ SOLN
INTRAMUSCULAR | Status: AC
  Filled 2014-07-25: qty 250

## 2014-07-25 MED ORDER — ATORVASTATIN CALCIUM 40 MG PO TABS
40.0000 mg | ORAL_TABLET | Freq: Every day | ORAL | Status: DC
  Administered 2014-07-26: 40 mg via ORAL
  Filled 2014-07-25: qty 1

## 2014-07-25 MED ORDER — ONDANSETRON HCL 4 MG/2ML IJ SOLN
INTRAMUSCULAR | Status: DC | PRN
  Administered 2014-07-25: 4 mg via INTRAVENOUS

## 2014-07-25 MED ORDER — GLYCOPYRROLATE 0.2 MG/ML IJ SOLN
INTRAMUSCULAR | Status: DC | PRN
  Administered 2014-07-25: 0.2 mg via INTRAVENOUS
  Administered 2014-07-25: 0.6 mg via INTRAVENOUS

## 2014-07-25 MED ORDER — CLOPIDOGREL BISULFATE 300 MG PO TABS
ORAL_TABLET | ORAL | Status: AC
  Filled 2014-07-25: qty 1

## 2014-07-25 MED ORDER — ONDANSETRON HCL 4 MG/2ML IJ SOLN: 4.0000 mg | Freq: Four times a day (QID) | INTRAMUSCULAR | Status: DC | PRN

## 2014-07-25 MED ORDER — OMEPRAZOLE MAGNESIUM 20 MG PO TBEC: 20.0000 mg | DELAYED_RELEASE_TABLET | Freq: Every day | ORAL | Status: DC | PRN

## 2014-07-25 MED ORDER — ADULT MULTIVITAMIN W/MINERALS CH
1.0000 | ORAL_TABLET | Freq: Every day | ORAL | Status: DC
  Administered 2014-07-25 – 2014-07-26 (×2): 1 via ORAL
  Filled 2014-07-25 (×2): qty 1

## 2014-07-25 MED ORDER — LABETALOL HCL 5 MG/ML IV SOLN
INTRAVENOUS | Status: DC | PRN
  Administered 2014-07-25: 10 mg via INTRAVENOUS

## 2014-07-25 MED ORDER — EPTIFIBATIDE 2 MG/ML IV SOLN
INTRAVENOUS | Status: DC | PRN
  Administered 2014-07-25: 12 mg

## 2014-07-25 MED ORDER — NORGESTIMATE-ETH ESTRADIOL 0.25-35 MG-MCG PO TABS: 1.0000 | ORAL_TABLET | Freq: Every day | ORAL | Status: DC

## 2014-07-25 MED ORDER — SERTRALINE HCL 100 MG PO TABS
200.0000 mg | ORAL_TABLET | Freq: Every day | ORAL | Status: DC
  Administered 2014-07-26: 200 mg via ORAL
  Filled 2014-07-25: qty 2

## 2014-07-25 MED ORDER — HEPARIN (PORCINE) IN NACL 100-0.45 UNIT/ML-% IJ SOLN
1000.0000 [IU]/h | INTRAMUSCULAR | Status: DC
  Administered 2014-07-25: 500 [IU]/h via INTRAVENOUS
  Administered 2014-07-25: 800 [IU]/h via INTRAVENOUS
  Filled 2014-07-25 (×2): qty 250

## 2014-07-25 MED ORDER — CLOPIDOGREL BISULFATE 300 MG PO TABS
300.0000 mg | ORAL_TABLET | Freq: Every day | ORAL | Status: DC
  Filled 2014-07-25: qty 1

## 2014-07-25 MED ORDER — IOHEXOL 300 MG/ML  SOLN
150.0000 mL | Freq: Once | INTRAMUSCULAR | Status: AC | PRN
  Administered 2014-07-25: 260 mL via INTRA_ARTERIAL

## 2014-07-25 MED ORDER — TRAZODONE HCL 50 MG PO TABS
50.0000 mg | ORAL_TABLET | Freq: Every day | ORAL | Status: DC
  Filled 2014-07-25 (×2): qty 1

## 2014-07-25 MED ORDER — NICARDIPINE HCL IN NACL 20-0.86 MG/200ML-% IV SOLN
5.0000 mg/h | INTRAVENOUS | Status: DC
Start: 2014-07-25 — End: 2014-07-26

## 2014-07-25 MED ORDER — LACTATED RINGERS IV SOLN
INTRAVENOUS | Status: DC | PRN
  Administered 2014-07-25 (×2): via INTRAVENOUS

## 2014-07-25 MED ORDER — KETOROLAC TROMETHAMINE 30 MG/ML IJ SOLN
30.0000 mg | Freq: Three times a day (TID) | INTRAMUSCULAR | Status: DC
  Administered 2014-07-25 – 2014-07-26 (×4): 30 mg via INTRAVENOUS
  Filled 2014-07-25 (×5): qty 1

## 2014-07-25 MED ORDER — HEPARIN SODIUM (PORCINE) 1000 UNIT/ML IJ SOLN
INTRAMUSCULAR | Status: DC | PRN
  Administered 2014-07-25: 1000 [IU] via INTRAVENOUS
  Administered 2014-07-25 (×5): 500 [IU] via INTRAVENOUS
  Administered 2014-07-25: 2000 [IU] via INTRAVENOUS
  Administered 2014-07-25: 500 [IU] via INTRAVENOUS

## 2014-07-25 MED ORDER — SODIUM CHLORIDE 0.9 % IV SOLN
10.0000 mg | INTRAVENOUS | Status: DC | PRN
  Administered 2014-07-25: 10 ug/min via INTRAVENOUS

## 2014-07-25 MED ORDER — PANTOPRAZOLE SODIUM 40 MG PO TBEC: 40.0000 mg | DELAYED_RELEASE_TABLET | Freq: Every day | ORAL | Status: DC | PRN

## 2014-07-25 MED ORDER — HYDROCODONE-ACETAMINOPHEN 5-325 MG PO TABS
1.0000 | ORAL_TABLET | Freq: Three times a day (TID) | ORAL | Status: DC | PRN
  Administered 2014-07-25: 1 via ORAL
  Filled 2014-07-25: qty 1

## 2014-07-25 MED ORDER — CEFAZOLIN SODIUM-DEXTROSE 2-3 GM-% IV SOLR
INTRAVENOUS | Status: AC
  Filled 2014-07-25: qty 50

## 2014-07-25 MED ORDER — KETOROLAC TROMETHAMINE 30 MG/ML IJ SOLN
INTRAMUSCULAR | Status: AC
  Filled 2014-07-25: qty 1

## 2014-07-25 MED ORDER — ROCURONIUM BROMIDE 100 MG/10ML IV SOLN
INTRAVENOUS | Status: DC | PRN
  Administered 2014-07-25: 10 mg via INTRAVENOUS
  Administered 2014-07-25: 50 mg via INTRAVENOUS
  Administered 2014-07-25 (×3): 10 mg via INTRAVENOUS

## 2014-07-25 MED ORDER — PROPRANOLOL HCL ER 60 MG PO CP24
60.0000 mg | ORAL_CAPSULE | Freq: Every day | ORAL | Status: DC
  Filled 2014-07-25: qty 1

## 2014-07-25 MED ORDER — AMLODIPINE BESYLATE 10 MG PO TABS
10.0000 mg | ORAL_TABLET | Freq: Every day | ORAL | Status: DC
  Administered 2014-07-26: 10 mg via ORAL
  Filled 2014-07-25: qty 1

## 2014-07-25 MED ORDER — NEOSTIGMINE METHYLSULFATE 10 MG/10ML IV SOLN
INTRAVENOUS | Status: DC | PRN
  Administered 2014-07-25: 4 mg via INTRAVENOUS

## 2014-07-25 MED ORDER — FENTANYL CITRATE 0.05 MG/ML IJ SOLN
INTRAMUSCULAR | Status: DC | PRN
  Administered 2014-07-25 (×4): 50 ug via INTRAVENOUS
  Administered 2014-07-25: 150 ug via INTRAVENOUS
  Administered 2014-07-25: 50 ug via INTRAVENOUS

## 2014-07-25 MED ORDER — LIDOCAINE HCL (CARDIAC) 20 MG/ML IV SOLN
INTRAVENOUS | Status: DC | PRN
  Administered 2014-07-25: 100 mg via INTRAVENOUS

## 2014-07-25 MED ORDER — SODIUM CHLORIDE 0.9 % IV SOLN: Freq: Once | INTRAVENOUS | Status: DC

## 2014-07-25 MED ORDER — IBUPROFEN 400 MG PO TABS
400.0000 mg | ORAL_TABLET | Freq: Four times a day (QID) | ORAL | Status: DC | PRN
  Filled 2014-07-25: qty 2

## 2014-07-25 MED ORDER — KETOROLAC TROMETHAMINE 0.5 % OP SOLN
1.0000 [drp] | Freq: Four times a day (QID) | OPHTHALMIC | Status: DC
  Administered 2014-07-25 – 2014-07-26 (×5): 1 [drp] via OPHTHALMIC
  Filled 2014-07-25: qty 3

## 2014-07-25 MED ORDER — PROPOFOL 10 MG/ML IV BOLUS
INTRAVENOUS | Status: DC | PRN
  Administered 2014-07-25: 120 mg via INTRAVENOUS

## 2014-07-25 MED ORDER — ACETAMINOPHEN 500 MG PO TABS
1000.0000 mg | ORAL_TABLET | Freq: Four times a day (QID) | ORAL | Status: DC | PRN
  Filled 2014-07-25: qty 2

## 2014-07-25 MED ORDER — DEXAMETHASONE SODIUM PHOSPHATE 4 MG/ML IJ SOLN
INTRAMUSCULAR | Status: DC | PRN
  Administered 2014-07-25: 8 mg via INTRAVENOUS

## 2014-07-25 MED ORDER — ASPIRIN 325 MG PO TABS: 325.0000 mg | ORAL_TABLET | Freq: Every day | ORAL | Status: DC

## 2014-07-25 MED ORDER — SODIUM CHLORIDE 0.9 % IV SOLN: INTRAVENOUS | Status: DC | PRN

## 2014-07-25 MED ORDER — ACETAMINOPHEN 650 MG RE SUPP: 650.0000 mg | Freq: Four times a day (QID) | RECTAL | Status: DC | PRN

## 2014-07-25 MED ORDER — ASPIRIN EC 325 MG PO TBEC
325.0000 mg | DELAYED_RELEASE_TABLET | Freq: Every day | ORAL | Status: DC
  Administered 2014-07-26: 325 mg via ORAL
  Filled 2014-07-25 (×2): qty 1

## 2014-07-25 NOTE — Transfer of Care (Signed)
Immediate Anesthesia Transfer of Care Note  Patient: [name]  Procedure(s) Performed: Procedure(s): RADIOLOGY WITH ANESTHESIA (N/A)  Patient Location: PACU  Anesthesia Type:General  Level of Consciousness: awake, alert , oriented and patient cooperative  Airway & Oxygen Therapy: Patient Spontanous Breathing and Patient connected to nasal cannula oxygen  Post-op Assessment: Report given to PACU RN, Post -op Vital signs reviewed and stable, Patient moving all extremities X 4 and Patient able to stick tongue midline  Post vital signs: Reviewed and stable  Complications: No apparent anesthesia complications

## 2014-07-25 NOTE — Procedures (Signed)
S/P Lt common carotid arteriogram,followed by packing of recanalized ACOM aneurysm

## 2014-07-25 NOTE — Progress Notes (Signed)
  Subjective: Post procedure  More awake  Alert. C/O irratation  In rt eye from ? Corneal abrasion responding to eye drops,\. Occasional  Rt frontal supraorbital H/As ,responding to tylenol... Denies any visual ,motor or sensory symptoms.Able to tolerate ice chips and H2O/ Denies any chest pains or SOB   Objective: Vital signs in last 24 hours: Temp:  [98.3 F (36.8 C)-98.6 F (37 C)] 98.3 F (36.8 C) (11/23 1526) Pulse Rate:  [58-66] 65 (11/23 1530) Resp:  [10-22] 21 (11/23 1530) BP: (118-145)/(75-89) 118/75 mmHg (11/23 1526) SpO2:  [93 %-99 %] 93 % (11/23 1530) Arterial Line BP: (123-163)/(56-96) 163/96 mmHg (11/23 1530) Weight:  [226 lb (102.513 kg)] 226 lb (102.513 kg) (11/23 0646)    Intake/Output from previous day:   Intake/Output this shift: Total I/O In: 2000 [I.V.:2000] Out: 1050 [Urine:1050] On examination. VS BP 120s/65s  Cuff. HR 60s SR. PaO2  97 %  RA.  Neuro Exam.  Alert, awake oriented to time ,place and space.Marland Kitchen  Speech and comprehension clear.Marland Kitchen  PEARLA.Marland Kitchen2.9mm Rt = LT EOMS full..  No nystagmus  Visual Fields.Full to confrontation  No Facial asymmetry.  Tongue Midline..  Motor..           NO drift of outstretched arms..          Power.5/5 Proximally and distally all four extremities..  Fine motor and coordination to finger to nose equal..  Gait . Not tested  Romberg . Not tested  Heel to toe.Not tested.  Rt groin soft . Mild tenderness . No palpable hematoma   Lab Results:  No results for input(s): WBC, HGB, HCT, PLT in the last 72 hours. BMET No results for input(s): NA, K, CL, CO2, GLUCOSE, BUN, CREATININE, CALCIUM in the last 72 hours. PT/INR No results for input(s): LABPROT, INR in the last 72 hours. ABG No results for input(s): PHART, HCO3 in the last 72 hours.  Invalid input(s): PCO2, PO2  Studies/Results: No results found.  Anti-infectives: Anti-infectives    None      Assessment/Plan: s/p Coiling of  recanalised ACCOM aneurysm .  Plan . 1. Cont with IV heparin ,close neuro obs and BP within parameters with IV cardene. 2.Advance diet to clear liquids as tolerated. 3.D/W patient and spouse  Rob Hickman 07/25/2014

## 2014-07-25 NOTE — Progress Notes (Signed)
Currently for aneuryms aACA - s/p coiliong  Patient Active Problem List   Diagnosis Date Noted  . Strep pharyngitis 06/03/2014  . Leukopenia 05/26/2014  . Low serum calcium 05/04/2014  . Absolute anemia 05/04/2014  . BMI 36.0-36.9,adult 05/04/2014  . Brain aneurysm 01/26/2014  . Angioedema of lips 01/12/2014  . Anterior cerebral artery aneurysm 10/25/2013  . DEPRESSION 04/01/2007  . HYPERLIPIDEMIA 02/06/2007  . Essential hypertension 02/06/2007   Looks well on camera. Talking on cell phone  A/P NO acute elink intervention at the moment  Dr. Brand Males, M.D., [facility].C.P Pulmonary and Critical Care Medicine Staff Physician [facility] Pager: [phone], If no answer or between  15:00h - 7:00h: call [phone]  07/25/2014 7:46 PM

## 2014-07-25 NOTE — Progress Notes (Signed)
Dr Estanislado Pandy in to see pt

## 2014-07-25 NOTE — Progress Notes (Signed)
PHARMACY NOTE  Pharmacy Consult :  43 y.o. female is currently on Heparin s/p aneurysm coiling procedure.   Heparin Dosing Wt :  83 kg  Hematology :  Recent Labs  07/25/14>2125  HEPARINUNFRC <0.10*    Current Medication[s] Include: Infusion[s]: Infusions:  . sodium chloride 75 mL/hr at 07/25/14 2200  . heparin 800 Units/hr (07/25/14 1521)  . lactated ringers 50 mL/hr at 07/25/14 0718  . niCARDipine      Assessment :  Heparin infusing at 800 units/hr.  Heparin level < 0.1 unit/ml.  This is below desired range.  No evidence of bleeding complications observed.  Goal :  Heparin level 0.1 - 0.25 units/ml  Plan : 1. Heparin will be increased to 1000 units/hr.   The next Heparin Level will be drawn in 6 hours 2. Heparin will be discontinued at 0700 AM as per protocol.  Hilton Saephan, Craig Guess,  Pharm.D  07/25/2014  10:34 PM

## 2014-07-25 NOTE — Anesthesia Preprocedure Evaluation (Addendum)
Anesthesia Evaluation  Patient identified by MRN, date of birth, ID band Patient awake    Reviewed: Allergy & Precautions, H&P , NPO status , Patient's Chart, lab work & pertinent test results  History of Anesthesia Complications (+) PONV  Airway Mallampati: II       Dental  (+) Teeth Intact, Dental Advisory Given   Pulmonary neg pulmonary ROS,          Cardiovascular hypertension, Pt. on medications     Neuro/Psych  Headaches, Anxiety Depression Anterior cerebral artery aneurysm S/P [facility] 01/2013    GI/Hepatic Neg liver ROS, GERD-  Controlled,  Endo/Other  negative endocrine ROS  Renal/GU negative Renal ROS     Musculoskeletal  (+) Arthritis -,   Abdominal   Peds  Hematology   Anesthesia Other Findings   Reproductive/Obstetrics                            Anesthesia Physical Anesthesia Plan  ASA: III  Anesthesia Plan: General   Post-op Pain Management:    Induction: Intravenous  Airway Management Planned: Oral ETT  Additional Equipment: Arterial line  Intra-op Plan:   Post-operative Plan: Extubation in OR  Informed Consent: I have reviewed the patients History and Physical, chart, labs and discussed the procedure including the risks, benefits and alternatives for the proposed anesthesia with the patient or authorized representative who has indicated his/her understanding and acceptance.   Dental advisory given  Plan Discussed with:   Anesthesia Plan Comments:         Anesthesia Quick Evaluation

## 2014-07-25 NOTE — Sedation Documentation (Signed)
Pt transported to Neuro PACU via stretcher with CRNA and RN.  Bedside report given to Suanne Marker, RN and neuro assessment completed upon arrival and WNLs at this time.  Right groin C/D/I without hematoma or bleeding at this time.

## 2014-07-25 NOTE — Plan of Care (Signed)
Problem: Phase I Progression Outcomes Goal: O2  to maintain Sats > 90% (post extubation) Outcome: Progressing Goal: Hemodynamically stable Outcome: Progressing Goal: Pain controlled with appropriate interventions Outcome: Progressing

## 2014-07-25 NOTE — Progress Notes (Signed)
pts right eye feels "scratchy", slightly red, NS flush to eye,Dr. Conrad Carefree made aware

## 2014-07-25 NOTE — H&P (Signed)
Chief Complaint: Anterior communicating artery aneurysm- neck remnant  Referring Physician(s): Maurizio Geno K  History of Present Illness: [name] is a 43 y.o. 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 PONV (postoperative nausea and vomiting)   . Hyperlipidemia     takes Atorvastatin daily  . Headache(784.0)   . 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 Hypertension     takes Amlodipine and Propranolol daily  . Anxiety     takes Ativan daily as needed  . Depression     takes Zoloft daily  . Insomnia     takes Trazodone at bedtime  . GERD (gastroesophageal reflux disease)     takes Omeprazole daily  . Arthritis     Past Surgical History  Procedure Laterality Date  . Myomectomy  2009  . Laparotomy    . Myomectomy  05/16/2011    Procedure: MYOMECTOMY;  Surgeon: Margarette Asal;  Location: [facility];  Service: Gynecology;  Laterality: N/A;  abdominal  . Radiology with anesthesia N/A 02/18/2013    Procedure: RADIOLOGY WITH ANESTHESIA;  Surgeon: Rob Hickman, MD;  Location: [facility];  Service: Radiology;  Laterality: N/A;  . Brain surgery    . Radiology with anesthesia N/A 01/26/2014    Procedure: RADIOLOGY WITH ANESTHESIA;  Surgeon: Rob Hickman, MD;  Location: [facility];  Service: Radiology;  Laterality: N/A;  . Dilation and curettage of uterus  2009    Allergies: Ace inhibitors  Medications: Prior to Admission medications   Medication Sig Start Date End Date Taking? Authorizing Provider  amLODipine (NORVASC) 10 MG tablet Take 1 tablet (10 mg total) by mouth daily. 05/05/14   Irene Pap, NP    aspirin EC 325 MG tablet Take 325 mg by mouth daily.    Historical Provider, MD  atorvastatin (LIPITOR) 40 MG tablet Take 1 tablet (40 mg total) by mouth daily. 05/04/14   Irene Pap, NP  HYDROcodone-acetaminophen (NORCO/VICODIN) 5-325 MG per tablet Take 1 tablet by mouth every 8 (eight) hours as needed for severe pain.    Historical Provider, MD  ibuprofen (ADVIL,MOTRIN) 200 MG tablet Take 400-800 mg by mouth every 6 (six) hours as needed (for pain).    Historical Provider, MD  LORazepam (ATIVAN) 0.5 MG tablet Take 1 tablet (0.5 mg total) by mouth 2 (two) times daily as needed for anxiety. 03/08/13   Marletta Lor, MD  Multiple Vitamin (MULTIVITAMIN WITH MINERALS) TABS tablet Take 1 tablet by mouth daily.    Historical Provider, MD  norgestimate-ethinyl estradiol (ORTHO-CYCLEN,SPRINTEC,PREVIFEM) 0.25-35 MG-MCG tablet Take 1 tablet by mouth daily.    Historical Provider, MD  Omega-3 Fatty Acids (FISH OIL PO) Take 1 capsule by mouth daily.    Historical Provider, MD  omeprazole (PRILOSEC OTC) 20 MG tablet Take 20 mg by mouth daily as needed (for acid reflux).     Historical Provider, MD  propranolol ER (INDERAL LA) 60 MG 24 hr capsule Take 1 capsule (60 mg total) by mouth at bedtime. 06/30/14   Irene Pap, NP  sertraline (ZOLOFT) 100 MG tablet Take 200 mg by mouth daily.     Historical Provider, MD  traZODone (  DESYREL) 50 MG tablet Take 50 mg by mouth at bedtime.  06/27/14   Historical Provider, MD    Family History  Problem Relation Age of Onset  . Heart disease Mother 37    cad  . Lupus Mother   . COPD Mother   . Arthritis Mother     rheumatoid  . AVM Mother   . Diabetes Mother   . Diabetes Father   . Cancer Father     thyroid  . Depression Sister   . Diabetes Sister   . Hyperlipidemia Sister   . Hypertension Sister   . Cancer Paternal Grandfather     colon    History   Social History  . Marital Status: Married    Spouse Name: N/A    Number of Children: 0  .  Years of Education: N/A   Occupational History  .     Social History Main Topics  . Smoking status: Never Smoker   . Smokeless tobacco: Never Used  . Alcohol Use: Yes     Comment: wine and beer on occasion  . Drug Use: No  . Sexual Activity: Yes    Birth Control/ Protection: Pill   Other Topics Concern  . None   Social History Narrative    Review of Systems: A 12 point ROS discussed and pertinent positives are indicated in the HPI above.  All other systems are negative.  Review of Systems  Constitutional: Negative for activity change, appetite change, fatigue and unexpected weight change.  HENT: Negative for hearing loss and sore throat.   Respiratory: Negative for cough and shortness of breath.   Cardiovascular: Negative for chest pain.  Gastrointestinal: Negative for nausea and abdominal pain.  Genitourinary: Negative for difficulty urinating.  Neurological: Negative for dizziness, tremors, seizures, syncope, facial asymmetry, speech difficulty, weakness, light-headedness, numbness and headaches.  Psychiatric/Behavioral: Negative for behavioral problems and confusion.    Vital Signs: LMP 07/10/2014  Physical Exam  Constitutional: She is oriented to person, place, and time. She appears well-developed and well-nourished.  HENT:  Small 2 day old fever blister to lip  Eyes: EOM are normal.  Neck: Normal range of motion.  Cardiovascular: Normal rate, regular rhythm and normal heart sounds.   No murmur heard. Pulmonary/Chest: Effort normal and breath sounds normal. No respiratory distress. She has no wheezes.  Abdominal: Soft. Bowel sounds are normal. She exhibits no distension. There is no tenderness.  Musculoskeletal: Normal range of motion. She exhibits no tenderness.  Neurological: She is alert and oriented to person, place, and time.  Skin: Skin is warm and dry.  Psychiatric: She has a normal mood and affect. Her behavior is normal. Judgment and thought content  normal.  Nursing note and vitals reviewed.   Imaging: No results found.  Labs:  CBC:  Recent Labs  05/04/14 1548 05/26/14 1408 06/24/14 0737 07/22/14 1519  WBC 2.3 Repeated and verified X2.* 9.7 8.2 9.8  HGB 13.2 13.5 12.8 13.5  HCT 39.0 40.5 38.5 40.4  PLT 318.0 324.0 263 285    COAGS:  Recent Labs  12/17/13 0639 01/19/14 0837 06/24/14 0737 07/22/14 1519  INR 1.01 0.83 0.95 0.97  APTT 29 26 29 28     BMP:  Recent Labs  01/19/14 0837 01/27/14 0500 05/04/14 1548 05/04/14 1557 05/26/14 1001 06/24/14 0737 07/22/14 1519  NA 139 142 137  --  136 141 139  K 3.8 3.1* 3.1*  --  3.9 3.9 3.5*  CL 96 105 100  --  100 101 99  CO2 30 28 27   --  29 26 26   GLUCOSE 102* 107* 92  --  107* 101* 117*  BUN 17 7 16   --  20 18 13   CALCIUM 9.7 7.8* 9.6 9.4 9.7 9.8 9.5  CREATININE 0.97 0.80 0.9  --  1.1 0.92 1.00  GFRNONAA 72* >90  --   --   --  77* 69*  GFRAA 84* >90  --   --   --  89* 81*    LIVER FUNCTION TESTS:  Recent Labs  10/22/13 0931 01/19/14 0837 05/04/14 1548 07/22/14 1519  BILITOT 0.5 0.3 0.4 0.3  AST 23 22 25 19   ALT 23 32 21 20  ALKPHOS 97 93 93 88  PROT 7.4 7.6 7.3 7.7  ALBUMIN 3.8 3.9 3.7 3.8    TUMOR MARKERS: No results for input(s): AFPTM, CEA, CA199, CHROMGRNA in the last 8760 hours.  Assessment and Plan:  Anterior communicating artery aneurysm Coiling 03/2013 LVIS stent 12/2013 Arteriogram 10/15 shows minimal change in neck remnent Now scheduled for cerebral arteriogram with probable additional coilng to aneurysm and neck Pt aware of procedure benefits and risks and agreeable to proceed Consent signed andin chart Pt understand she will be admitted overnight to Neuro ICU if intervention performed  Thank you for this interesting consult.  I greatly enjoyed meeting [place] and look forward to participating in their care.    I spent a total of 20 minutes face to face in clinical consultation, greater than 50% of which was  counseling/coordinating care for Cer Arteriogram with possible additional coiling to [facility] aneurysm  Signed: TURPIN,PAMELA A 07/25/2014, 7:24 AM

## 2014-07-25 NOTE — Anesthesia Postprocedure Evaluation (Signed)
Anesthesia Post Note  Patient: [name]  Procedure(s) Performed: Procedure(s) (LRB): RADIOLOGY WITH ANESTHESIA (N/A)  Anesthesia type: general  Patient location: PACU  Post pain: Pain level controlled  Post assessment: Patient's Cardiovascular Status Stable  Last Vitals:  Filed Vitals:   07/25/14 1320  BP:   Pulse: 65  Temp:   Resp: 15    Post vital signs: Reviewed and stable  Level of consciousness: sedated  Complications: No apparent anesthesia complications

## 2014-07-25 NOTE — Anesthesia Postprocedure Evaluation (Signed)
Anesthesia Post Note  Patient: [name]  Procedure(s) Performed: Procedure(s) (LRB): RADIOLOGY WITH ANESTHESIA (N/A)  Anesthesia type: general  Patient location: PACU  Post pain: Pain level controlled  Post assessment: Patient's Cardiovascular Status Stable  Last Vitals:  Filed Vitals:   07/25/14 1415  BP:   Pulse: 62  Temp:   Resp: 16    Post vital signs: Reviewed and stable  Level of consciousness: sedated  Complications: No apparent anesthesia complications

## 2014-07-25 NOTE — Progress Notes (Signed)
ANTICOAGULATION CONSULT NOTE - Initial Consult  Pharmacy Consult for Heparin Indication: Post-embolizaton  Allergies  Allergen Reactions  . Ace Inhibitors Swelling and Other (See Comments)    Angioedema     Patient Measurements: Height: 5\' 6"  (167.6 cm) Weight: 226 lb (102.513 kg) IBW/kg (Calculated) : 59.3 Heparin Dosing Weight: 82.55 kg  Vital Signs: Temp: 98.6 F (37 C) (11/23 1307) Temp Source: Oral (11/23 0646) BP: 145/89 mmHg (11/23 0646) Pulse Rate: 59 (11/23 1445)  Labs:  Recent Labs  07/22/14 1519  HGB 13.5  HCT 40.4  PLT 285  APTT 28  LABPROT 13.0  INR 0.97  CREATININE 1.00    Estimated Creatinine Clearance: 90.4 mL/min (by C-G formula based on Cr of 1).   Medical History: Past Medical History  Diagnosis Date  . PONV (postoperative nausea and vomiting)   . Hyperlipidemia     takes Atorvastatin daily  . Headache(784.0)   . 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 Hypertension     takes Amlodipine and Propranolol daily  . Anxiety     takes Ativan daily as needed  . Depression     takes Zoloft daily  . Insomnia     takes Trazodone at bedtime  . GERD (gastroesophageal reflux disease)     takes Omeprazole daily  . Arthritis     Medications:  Prescriptions prior to admission  Medication Sig Dispense Refill Last Dose  . amLODipine (NORVASC) 10 MG tablet Take 1 tablet (10 mg total) by mouth daily. 30 tablet 3 07/25/2014 at Unknown time  . aspirin EC 325 MG tablet Take 325 mg by mouth daily.   07/25/2014 at Unknown time  . atorvastatin (LIPITOR) 40 MG tablet Take 1 tablet (40 mg total) by mouth daily. 30 tablet 3 07/25/2014 at Unknown time  . HYDROcodone-acetaminophen (NORCO/VICODIN) 5-325 MG per tablet Take 1 tablet by mouth every 8 (eight) hours as needed for severe pain.   Past Week at Unknown time  . ibuprofen (ADVIL,MOTRIN) 200 MG tablet Take 400-800 mg by mouth every 6 (six) hours as needed (for  pain).   Past Week at Unknown time  . LORazepam (ATIVAN) 0.5 MG tablet Take 1 tablet (0.5 mg total) by mouth 2 (two) times daily as needed for anxiety. 60 tablet 2 07/24/2014 at Unknown time  . Multiple Vitamin (MULTIVITAMIN WITH MINERALS) TABS tablet Take 1 tablet by mouth daily.   07/24/2014 at Unknown time  . norgestimate-ethinyl estradiol (ORTHO-CYCLEN,SPRINTEC,PREVIFEM) 0.25-35 MG-MCG tablet Take 1 tablet by mouth daily.   07/25/2014 at Unknown time  . Omega-3 Fatty Acids (FISH OIL PO) Take 1 capsule by mouth daily.   Past Week at Unknown time  . omeprazole (PRILOSEC OTC) 20 MG tablet Take 20 mg by mouth daily as needed (for acid reflux).    Past Month at Unknown time  . propranolol ER (INDERAL LA) 60 MG 24 hr capsule Take 1 capsule (60 mg total) by mouth at bedtime. 30 capsule 2 07/25/2014 at 0530  . sertraline (ZOLOFT) 100 MG tablet Take 200 mg by mouth daily.    07/25/2014 at Unknown time  . traZODone (DESYREL) 50 MG tablet Take 50 mg by mouth at bedtime.   2 07/24/2014 at Unknown time    Assessment: 43 y/o F s/p cerebral angiogram for brain aneurysm.  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   Goal of Therapy:  HL 0.1-0.25 Monitor platelets by anticoagulation protocol: Yes   Plan:  Heparin 500 units/hr started 1315. Increase IV heparin to 800 units/hr. Called [facility] for rate change. Check heparin level in 6 hrs. HL and CBC in AM.   Brodie Correll S. Alford Highland, PharmD, BCPS Clinical Staff Pharmacist Pager [phone]  Eilene Ghazi Stillinger 07/25/2014,3:15 PM

## 2014-07-26 ENCOUNTER — Encounter (HOSPITAL_COMMUNITY): Payer: Self-pay | Admitting: Interventional Radiology

## 2014-07-26 LAB — BASIC METABOLIC PANEL
Anion gap: 15 (ref 5–15)
BUN: 11 mg/dL (ref 6–23)
CO2: 22 mEq/L (ref 19–32)
Calcium: 8 mg/dL — ABNORMAL LOW (ref 8.4–10.5)
Chloride: 98 mEq/L (ref 96–112)
Creatinine, Ser: 0.89 mg/dL (ref 0.50–1.10)
GFR calc Af Amer: >90 mL/min (ref >90)
GFR calc non Af Amer: 80 mL/min — ABNORMAL LOW (ref >90)
Glucose, Bld: 96 mg/dL (ref 70–99)
Potassium: 3.5 mEq/L — ABNORMAL LOW (ref 3.7–5.3)
Sodium: 135 mEq/L — ABNORMAL LOW (ref 137–147)

## 2014-07-26 LAB — CBC WITH DIFFERENTIAL/PLATELET
Basophils Absolute: 0 10*3/uL (ref 0.0–0.1)
Basophils Relative: 0 % (ref 0–1)
Eosinophils Absolute: 0 10*3/uL (ref 0.0–0.7)
Eosinophils Relative: 0 % (ref 0–5)
HCT: 34 % — ABNORMAL LOW (ref 36.0–46.0)
Hemoglobin: 11.2 g/dL — ABNORMAL LOW (ref 12.0–15.0)
Lymphocytes Relative: 12 % (ref 12–46)
Lymphs Abs: 1 10*3/uL (ref 0.7–4.0)
MCH: 31 pg (ref 26.0–34.0)
MCHC: 32.9 g/dL (ref 30.0–36.0)
MCV: 94.2 fL (ref 78.0–100.0)
Monocytes Absolute: 0.7 10*3/uL (ref 0.1–1.0)
Monocytes Relative: 9 % (ref 3–12)
Neutro Abs: 6.4 10*3/uL (ref 1.7–7.7)
Neutrophils Relative %: 79 % — ABNORMAL HIGH (ref 43–77)
Platelets: 218 10*3/uL (ref 150–400)
RBC: 3.61 MIL/uL — ABNORMAL LOW (ref 3.87–5.11)
RDW: 12.5 % (ref 11.5–15.5)
WBC: 8.1 10*3/uL (ref 4.0–10.5)

## 2014-07-26 LAB — HEPARIN LEVEL (UNFRACTIONATED): Heparin Unfractionated: 0.17 IU/mL — ABNORMAL LOW (ref 0.30–0.70)

## 2014-07-26 MED ORDER — SODIUM CHLORIDE 0.9 % IV SOLN
INTRAVENOUS | Status: DC | PRN
Start: 2014-07-26 — End: 2014-07-26

## 2014-07-26 NOTE — Plan of Care (Signed)
Problem: Consults Goal: Vascular Cath Int Patient Education (See Patient Education module for education specifics.) Outcome: Completed/Met Date Met:  07/26/14 Goal: Skin Care Protocol Initiated - if Braden Score 18 or less If consults are not indicated, leave blank or document N/A Outcome: Completed/Met Date Met:  07/26/14 Goal: Tobacco Cessation referral if indicated Outcome: Not Applicable Date Met:  68/12/75 Goal: Nutrition Consult-if indicated Outcome: Not Applicable Date Met:  17/00/17 Goal: Diabetes Guidelines if Diabetic/Glucose > 140 If diabetic or lab glucose is > 140 mg/dl - Initiate Diabetes/Hyperglycemia Guidelines & Document Interventions  Outcome: Not Applicable Date Met:  49/44/96  Problem: Phase I Progression Outcomes Goal: Distal pulses equal to baseline Outcome: Completed/Met Date Met:  07/26/14 Goal: Vascular site scale level 0 - I Vascular Site Scale Level 0: No bruising/bleeding/hematoma Level I (Mild): Bruising/Ecchymosis, minimal bleeding/ooozing, palpable hematoma < 3 cm Level II (Moderate): Bleeding not affecting hemodynamic parameters, pseudoaneurysm, palpable hematoma > 3 cm Level III (Severe) Bleeding which affects hemodynamic parameters or retroperitoneal hemorrhage  Outcome: Completed/Met Date Met:  07/26/14 Goal: Pain controlled with appropriate interventions Outcome: Completed/Met Date Met:  07/26/14 Goal: Initial discharge plan identified Outcome: Completed/Met Date Met:  07/26/14 Goal: Voiding-avoid urinary catheter unless indicated Outcome: Completed/Met Date Met:  07/26/14 Goal: Hemodynamically stable Outcome: Completed/Met Date Met:  07/26/14 Goal: Other Phase I Outcomes/Goals Outcome: Completed/Met Date Met:  07/26/14  Problem: Phase II Progression Outcomes Goal: Barriers To Progression Addressed/Resolved Outcome: Not Applicable Date Met:  75/91/63 Goal: Discharge plan in place and appropriate Outcome: Completed/Met Date Met:   07/26/14 Goal: Pain controlled with appropriate interventions Outcome: Completed/Met Date Met:  07/26/14 Goal: Hemodynamically stable Outcome: Completed/Met Date Met:  07/26/14 Goal: Ambulates up to 600 ft. in hall x 1 Outcome: Completed/Met Date Met:  84/66/59 Goal: Complications resolved/controlled Outcome: Completed/Met Date Met:  07/26/14 Goal: Tolerates diet Outcome: Completed/Met Date Met:  07/26/14 Goal: Activity appropriate for discharge plan Outcome: Completed/Met Date Met:  07/26/14 Goal: Vascular site scale level 0 - I Vascular Site Scale Level 0: No bruising/bleeding/hematoma Level I (Mild): Bruising/Ecchymosis, minimal bleeding/ooozing, palpable hematoma < 3 cm Level II (Moderate): Bleeding not affecting hemodynamic parameters, pseudoaneurysm, palpable hematoma > 3 cm Level III (Severe) Bleeding which affects hemodynamic parameters or retroperitoneal hemorrhage  Outcome: Completed/Met Date Met:  07/26/14 Goal: Other Discharge Outcomes/Goals Outcome: Completed/Met Date Met:  07/26/14

## 2014-07-26 NOTE — Progress Notes (Signed)
ANTICOAGULATION CONSULT NOTE - Follow Up Consult  Pharmacy Consult for heparin Indication: s/p aneurysm coiling   Labs:  Recent Labs  07/25/14 2125 07/26/14 0440 07/26/14 0500  HGB  --  11.2*  --   HCT  --  34.0*  --   PLT  --  218  --   HEPARINUNFRC <0.10*  --  0.17*      Assessment/Plan:  43yo female therapeutic on heparin after rate increase. Will continue gtt at current rate until off at 0700.   Wynona Neat, PharmD, BCPS  07/26/2014,5:37 AM

## 2014-07-26 NOTE — Progress Notes (Signed)
UR completed.  Nadya Hopwood, RN BSN MHA CCM Trauma/Neuro ICU Case Manager [phone]  

## 2014-07-26 NOTE — Progress Notes (Signed)
Referring Physician(s): Dr Estanislado Pandy  Subjective:  A COM aneurysm coiling 03/2013 Stent 12/2013 Neck remnant on 06/2014 arteriogram Additional coiling 07/25/14 Pt has done well after procedure Still with minimal Rt eye pain after mild corneal abrasion Complains of slight headache Rt side head Vicodin helps Denies N/V Slept ok  Allergies: Ace inhibitors  Medications: Prior to Admission medications   Medication Sig Start Date End Date Taking? Authorizing Provider  amLODipine (NORVASC) 10 MG tablet Take 1 tablet (10 mg total) by mouth daily. 05/05/14  Yes Irene Pap, NP  aspirin EC 325 MG tablet Take 325 mg by mouth daily.   Yes Historical Provider, MD  atorvastatin (LIPITOR) 40 MG tablet Take 1 tablet (40 mg total) by mouth daily. 05/04/14  Yes Irene Pap, NP  HYDROcodone-acetaminophen (NORCO/VICODIN) 5-325 MG per tablet Take 1 tablet by mouth every 8 (eight) hours as needed for severe pain.   Yes Historical Provider, MD  ibuprofen (ADVIL,MOTRIN) 200 MG tablet Take 400-800 mg by mouth every 6 (six) hours as needed (for pain).   Yes Historical Provider, MD  LORazepam (ATIVAN) 0.5 MG tablet Take 1 tablet (0.5 mg total) by mouth 2 (two) times daily as needed for anxiety. 03/08/13  Yes Marletta Lor, MD  Multiple Vitamin (MULTIVITAMIN WITH MINERALS) TABS tablet Take 1 tablet by mouth daily.   Yes Historical Provider, MD  norgestimate-ethinyl estradiol (ORTHO-CYCLEN,SPRINTEC,PREVIFEM) 0.25-35 MG-MCG tablet Take 1 tablet by mouth daily.   Yes Historical Provider, MD  Omega-3 Fatty Acids (FISH OIL PO) Take 1 capsule by mouth daily.   Yes Historical Provider, MD  omeprazole (PRILOSEC OTC) 20 MG tablet Take 20 mg by mouth daily as needed (for acid reflux).    Yes Historical Provider, MD  propranolol ER (INDERAL LA) 60 MG 24 hr capsule Take 1 capsule (60 mg total) by mouth at bedtime. 06/30/14  Yes Irene Pap, NP  sertraline (ZOLOFT) 100 MG tablet Take 200 mg by mouth daily.    Yes  Historical Provider, MD  traZODone (DESYREL) 50 MG tablet Take 50 mg by mouth at bedtime.  06/27/14  Yes Historical Provider, MD    Review of Systems  Vital Signs: BP 108/66 mmHg  Pulse 65  Temp(Src) 98 F (36.7 C) (Oral)  Resp 18  Ht 5\' 6"  (1.676 m)  Wt 105.5 kg (232 lb 9.4 oz)  BMI 37.56 kg/m2  SpO2 94%  LMP 07/11/2014  Physical Exam  Eyes: EOM are normal.  Neck: Normal range of motion.  Abdominal:  Rt groin NT no bleeding  No hematoma Soft Rt foot 2+ pulses   Musculoskeletal: Normal range of motion. She exhibits tenderness.    Imaging: No results found.  Labs:  CBC:  Recent Labs  05/26/14 1408 06/24/14 0737 07/22/14 1519 07/26/14 0440  WBC 9.7 8.2 9.8 8.1  HGB 13.5 12.8 13.5 11.2*  HCT 40.5 38.5 40.4 34.0*  PLT 324.0 263 285 218    COAGS:  Recent Labs  12/17/13 0639 01/19/14 0837 06/24/14 0737 07/22/14 1519  INR 1.01 0.83 0.95 0.97  APTT 29 26 29 28     BMP:  Recent Labs  01/27/14 0500  05/26/14 1001 06/24/14 0737 07/22/14 1519 07/26/14 0440  NA 142  < > 136 141 139 135*  K 3.1*  < > 3.9 3.9 3.5* 3.5*  CL 105  < > 100 101 99 98  CO2 28  < > 29 26 26 22   GLUCOSE 107*  < > 107* 101* 117* 96  BUN 7  < > 20 18 13 11   CALCIUM 7.8*  < > 9.7 9.8 9.5 8.0*  CREATININE 0.80  < > 1.1 0.92 1.00 0.89  GFRNONAA >90  --   --  77* 69* 80*  GFRAA >90  --   --  89* 81* >90  < > = values in this interval not displayed.  LIVER FUNCTION TESTS:  Recent Labs  10/22/13 0931 01/19/14 0837 05/04/14 1548 07/22/14 1519  BILITOT 0.5 0.3 0.4 0.3  AST 23 22 25 19   ALT 23 32 21 20  ALKPHOS 97 93 93 88  PROT 7.4 7.6 7.3 7.7  ALBUMIN 3.8 3.9 3.7 3.8    Assessment and Plan:  Additional coiling to recanalized A COM aneurysm Pt has done well Plan for dc to home today Dr Estanislado Pandy will see and evaluate pt DC art line DC foley Advance diet Ambulate with assistance    I spent a total of 20 minutes face to face in clinical  consultation/evaluation, greater than 50% of which was counseling/coordinating care for A COM aneurysm additional coiling  Signed: TURPIN,PAMELA A 07/26/2014, 7:51 AM

## 2014-07-26 NOTE — Discharge Summary (Signed)
Patient ID: [name] MRN: [id number] DOB/AGE: March 20, 1971 43 y.o.  Admit date: 07/25/2014 Discharge date: 07/26/2014  Admission Diagnoses: Anterior communicating artery aneurysm  Discharge Diagnoses: Treatment of neck remnant ACOM aneurysm  Active Problems: Brain aneurysm                                HLD                                HTN                                Anemia   Discharged Condition: improved/stable  Hospital Course: Pt with known Anterior communicating artery aneurysm. Coiling 03/2013; LVIS stent placed 12/2013. Cerebral arteriogram 06/2014 revealed continued neck remnant-- additional coils placed 07/25/14 with Dr Estanislado Pandy. Pt has done well overnight in Neuro ICU. Slept ok. Denies headache; N/V. No complaints of pain.  Denies visual or speech issues. Rt eye is much better this am; seeing well. Passing gas; urinating on own Dr Estanislado Pandy has seen and examined pt. DC to home now.   Consults: None  Significant Diagnostic Studies: Cerebral arteriogram  Treatments: additional coiling anterior communicating artery neck remnant  Discharge Exam: Blood pressure 128/88, pulse 65, temperature 98.2 F (36.8 C), temperature source Oral, resp. rate 18, height 5\' 6"  (1.676 m), weight 105.5 kg (232 lb 9.4 oz), last menstrual period 07/11/2014, SpO2 94 %.  PE:  Afeb; vss Appropriate Pleasant A/O Face symmetrical; smile = Tongue midline Heart: RRR w/o M Lungs: CTA Abd: soft; +BS; NT Extr: FROM = strength; good sensation Rt groin: NT; no bleeding No hematoma Rt foot 2+ pulses UOP good; yellow  Results for orders placed or performed during the hospital encounter of 07/25/14  MRSA PCR Screening  Result Value Ref Range   MRSA by PCR NEGATIVE NEGATIVE  Heparin level (unfractionated)  Result Value Ref Range   Heparin Unfractionated <0.10 (L) 0.30 - 0.70 IU/mL  Heparin level (unfractionated)  Result Value Ref Range   Heparin Unfractionated 0.17  (L) 0.30 - 0.70 IU/mL  Basic metabolic panel  Result Value Ref Range   Sodium 135 (L) 137 - 147 mEq/L   Potassium 3.5 (L) 3.7 - 5.3 mEq/L   Chloride 98 96 - 112 mEq/L   CO2 22 19 - 32 mEq/L   Glucose, Bld 96 70 - 99 mg/dL   BUN 11 6 - 23 mg/dL   Creatinine, Ser 0.89 0.50 - 1.10 mg/dL   Calcium 8.0 (L) 8.4 - 10.5 mg/dL   GFR calc non Af Amer 80 (L) >90 mL/min   GFR calc Af Amer >90 >90 mL/min   Anion gap 15 5 - 15  CBC WITH DIFFERENTIAL  Result Value Ref Range   WBC 8.1 4.0 - 10.5 K/uL   RBC 3.61 (L) 3.87 - 5.11 MIL/uL   Hemoglobin 11.2 (L) 12.0 - 15.0 g/dL   HCT 34.0 (L) 36.0 - 46.0 %   MCV 94.2 78.0 - 100.0 fL   MCH 31.0 26.0 - 34.0 pg   MCHC 32.9 30.0 - 36.0 g/dL   RDW 12.5 11.5 - 15.5 %   Platelets 218 150 - 400 K/uL   Neutrophils Relative % 79 (H) 43 - 77 %   Neutro Abs 6.4 1.7 - 7.7 K/uL  Lymphocytes Relative 12 12 - 46 %   Lymphs Abs 1.0 0.7 - 4.0 K/uL   Monocytes Relative 9 3 - 12 %   Monocytes Absolute 0.7 0.1 - 1.0 K/uL   Eosinophils Relative 0 0 - 5 %   Eosinophils Absolute 0.0 0.0 - 0.7 K/uL   Basophils Relative 0 0 - 1 %   Basophils Absolute 0.0 0.0 - 0.1 K/uL    Disposition: ACOM aneurysm additional coiling to neck remnant DC to home Continue all home meds Plavix: half tablet daily ASA: 325 mg daily Follow up 2 weeks with Dr Estanislado Pandy Pt has good understanding of dc instructions Dr Estanislado Pandy has seen and examined pt   Discharge Instructions    Call MD for:  difficulty breathing, headache or visual disturbances    Complete by:  As directed      Call MD for:  extreme fatigue    Complete by:  As directed      Call MD for:  hives    Complete by:  As directed      Call MD for:  persistant dizziness or light-headedness    Complete by:  As directed      Call MD for:  persistant nausea and vomiting    Complete by:  As directed      Call MD for:  redness, tenderness, or signs of infection (pain, swelling, redness, odor or green/yellow discharge around  incision site)    Complete by:  As directed      Call MD for:  severe uncontrolled pain    Complete by:  As directed      Call MD for:  temperature >100.4    Complete by:  As directed      Diet - low sodium heart healthy    Complete by:  As directed      Discharge instructions    Complete by:  As directed   Follow up with Dr Estanislado Pandy 2 weeks; Plavix half tablet daily; ASA 325 mg daily; continue all home meds; scheduler will call pt with time and date of appt; call [phone] if questions/concerns     Discharge wound care:    Complete by:  As directed   Change band aid at Rt groin daily x 7 days     Driving Restrictions    Complete by:  As directed   No driving x 2 weeks     Increase activity slowly    Complete by:  As directed      Lifting restrictions    Complete by:  As directed   No lifting over 10 lbs x 2 weeks     Other Restrictions    Complete by:  As directed   May shower today; replace band aid at groin site daily x 7 days            Medication List    TAKE these medications        amLODipine 10 MG tablet  Commonly known as:  NORVASC  Take 1 tablet (10 mg total) by mouth daily.     aspirin EC 325 MG tablet  Take 325 mg by mouth daily.     atorvastatin 40 MG tablet  Commonly known as:  LIPITOR  Take 1 tablet (40 mg total) by mouth daily.     FISH OIL PO  Take 1 capsule by mouth daily.     HYDROcodone-acetaminophen 5-325 MG per tablet  Commonly known as:  NORCO/VICODIN  Take 1 tablet by  mouth every 8 (eight) hours as needed for severe pain.     ibuprofen 200 MG tablet  Commonly known as:  ADVIL,MOTRIN  Take 400-800 mg by mouth every 6 (six) hours as needed (for pain).     LORazepam 0.5 MG tablet  Commonly known as:  ATIVAN  Take 1 tablet (0.5 mg total) by mouth 2 (two) times daily as needed for anxiety.     multivitamin with minerals Tabs tablet  Take 1 tablet by mouth daily.     norgestimate-ethinyl estradiol 0.25-35 MG-MCG tablet  Commonly known  as:  ORTHO-CYCLEN,SPRINTEC,PREVIFEM  Take 1 tablet by mouth daily.     omeprazole 20 MG tablet  Commonly known as:  PRILOSEC OTC  Take 20 mg by mouth daily as needed (for acid reflux).     propranolol ER 60 MG 24 hr capsule  Commonly known as:  INDERAL LA  Take 1 capsule (60 mg total) by mouth at bedtime.     sertraline 100 MG tablet  Commonly known as:  ZOLOFT  Take 200 mg by mouth daily.     traZODone 50 MG tablet  Commonly known as:  DESYREL  Take 50 mg by mouth at bedtime.         Signed: Lavonia Drafts [facility] 07/26/2014, 11:09 AM

## 2014-08-08 ENCOUNTER — Ambulatory Visit (HOSPITAL_COMMUNITY): Payer: Commercial Managed Care - PPO

## 2014-08-12 ENCOUNTER — Other Ambulatory Visit: Payer: Self-pay | Admitting: Radiology

## 2014-08-12 ENCOUNTER — Ambulatory Visit (HOSPITAL_COMMUNITY)
Admission: RE | Admit: 2014-08-12 | Discharge: 2014-08-12 | Disposition: A | Discharge status: Home / Self Care | Payer: Commercial Managed Care - PPO | Source: Ambulatory Visit | Attending: Radiology | Admitting: Radiology

## 2014-08-12 DIAGNOSIS — R030 Elevated blood-pressure reading, without diagnosis of hypertension: Secondary | ICD-10-CM | POA: Diagnosis not present

## 2014-08-12 DIAGNOSIS — E780 Pure hypercholesterolemia: Secondary | ICD-10-CM | POA: Diagnosis not present

## 2014-08-12 DIAGNOSIS — R51 Headache: Secondary | ICD-10-CM | POA: Diagnosis not present

## 2014-08-12 DIAGNOSIS — Z48812 Encounter for surgical aftercare following surgery on the circulatory system: Secondary | ICD-10-CM | POA: Insufficient documentation

## 2014-08-12 DIAGNOSIS — I10 Essential (primary) hypertension: Secondary | ICD-10-CM

## 2014-08-12 DIAGNOSIS — Z7982 Long term (current) use of aspirin: Secondary | ICD-10-CM | POA: Diagnosis not present

## 2014-08-12 DIAGNOSIS — D72819 Decreased white blood cell count, unspecified: Secondary | ICD-10-CM

## 2014-08-12 DIAGNOSIS — I671 Cerebral aneurysm, nonruptured: Secondary | ICD-10-CM

## 2014-08-12 DIAGNOSIS — R7989 Other specified abnormal findings of blood chemistry: Secondary | ICD-10-CM

## 2014-08-12 DIAGNOSIS — T783XXA Angioneurotic edema, initial encounter: Secondary | ICD-10-CM

## 2014-08-12 DIAGNOSIS — Z6836 Body mass index (BMI) 36.0-36.9, adult: Secondary | ICD-10-CM

## 2014-08-12 DIAGNOSIS — J020 Streptococcal pharyngitis: Secondary | ICD-10-CM

## 2014-08-12 LAB — PLATELET INHIBITION P2Y12: Platelet Function  P2Y12: 13 [PRU] — ABNORMAL LOW (ref 194–418)

## 2014-09-05 ENCOUNTER — Other Ambulatory Visit: Payer: Self-pay

## 2014-09-05 DIAGNOSIS — E039 Hypothyroidism, unspecified: Secondary | ICD-10-CM

## 2014-09-05 DIAGNOSIS — Z6836 Body mass index (BMI) 36.0-36.9, adult: Secondary | ICD-10-CM

## 2014-09-05 MED ORDER — ATORVASTATIN CALCIUM 40 MG PO TABS: 40.0000 mg | ORAL_TABLET | Freq: Every day | ORAL | Status: DC

## 2014-09-05 NOTE — Telephone Encounter (Signed)
pls call pt: Advise Need to sched fasting OV to check lipid panel again-sched by April.

## 2014-09-05 NOTE — Telephone Encounter (Signed)
[facility] requesting a refill on Atorvastatin 40 mg. Last OV 06/15/2014

## 2014-09-12 ENCOUNTER — Telehealth (HOSPITAL_COMMUNITY): Payer: Self-pay | Admitting: Interventional Radiology

## 2014-09-12 ENCOUNTER — Other Ambulatory Visit: Payer: Self-pay

## 2014-09-12 DIAGNOSIS — I10 Essential (primary) hypertension: Secondary | ICD-10-CM

## 2014-09-12 MED ORDER — AMLODIPINE BESYLATE 10 MG PO TABS: 10.0000 mg | ORAL_TABLET | Freq: Every day | ORAL | Status: DC

## 2014-09-12 NOTE — Telephone Encounter (Signed)
[facility] requesting refill on Amlodipine.

## 2014-09-12 NOTE — Telephone Encounter (Signed)
I tried to call pt, unable to reach her at this time. The Rx request From [facility] has it written as Amlodipine 10 mg Take one tablet by mouth daily #30. I can still verify with pt if you would like.

## 2014-09-12 NOTE — Telephone Encounter (Signed)
pls call pt: Ask if she is taking whole tab or 1/2.

## 2014-09-12 NOTE — Telephone Encounter (Signed)
Spoke with pt, she takes a whole tablet now. She states her blood pressure was still elevated when she took 1/2 of a pill so she switched to a whole. Her BP is within normal range when she takes the whole pill.

## 2014-09-12 NOTE — Telephone Encounter (Signed)
Pls verify. If you refer to my ofc notes for HTN: I asked her to decrease to 1/2 T qd & monitor pressure at home. Thanx.

## 2014-09-26 ENCOUNTER — Encounter: Payer: Self-pay | Admitting: Family Medicine

## 2014-09-26 ENCOUNTER — Ambulatory Visit (INDEPENDENT_AMBULATORY_CARE_PROVIDER_SITE_OTHER): Payer: Commercial Managed Care - PPO | Admitting: Family Medicine

## 2014-09-26 VITALS — BP 115/110 | HR 67 | Temp 97.6°F | Ht 66.0 in | Wt 233.0 lb

## 2014-09-26 DIAGNOSIS — S20229A Contusion of unspecified back wall of thorax, initial encounter: Secondary | ICD-10-CM

## 2014-09-26 DIAGNOSIS — S39012A Strain of muscle, fascia and tendon of lower back, initial encounter: Secondary | ICD-10-CM

## 2014-09-26 MED ORDER — HYDROCODONE-ACETAMINOPHEN 5-325 MG PO TABS: 1.0000 | ORAL_TABLET | Freq: Four times a day (QID) | ORAL | Status: DC | PRN

## 2014-09-26 NOTE — Progress Notes (Signed)
Pre visit review using our clinic review tool, if applicable. No additional management support is needed unless otherwise documented below in the visit note. 

## 2014-09-26 NOTE — Patient Instructions (Signed)
Take 600 mg ibuprofen two times a day with food for the next 7 days.

## 2014-09-26 NOTE — Progress Notes (Signed)
OFFICE NOTE  09/26/2014  CC:  Chief Complaint  Patient presents with  . Fall   HPI: Patient is a 44 y.o. Caucasian female who is here for low back and hip pain s/p fall 2 days ago. Was outside on slippery/snowy steps and feet went out from under her and buttocks (mainly right) hit on wooden steps and bounced on each of the next few steps.  She was able to get up and walk. Started hurting worse yesterday.  She did not seek medical care.  Took ibuprofen 800 this morning. Some alleve and tylenol yesterday: helped some.  Still hurting so came in for check today.  Hurst all across low back and on gluteal region esp right glut. The pain does extend some around the sides/hips.  No radiation down legs. It has "flared up" her post-ruptured aneurism HA problem that she has been dealing with chronically.   Pertinent PMH:  Past medical, surgical, social, and family history reviewed and no changes are noted since last office visit.  MEDS:  Outpatient Prescriptions Prior to Visit  Medication Sig Dispense Refill  . amLODipine (NORVASC) 10 MG tablet Take 1 tablet (10 mg total) by mouth daily. 30 tablet 3  . aspirin EC 325 MG tablet Take 325 mg by mouth daily.    Marland Kitchen atorvastatin (LIPITOR) 40 MG tablet Take 1 tablet (40 mg total) by mouth daily. 30 tablet 3  . HYDROcodone-acetaminophen (NORCO/VICODIN) 5-325 MG per tablet Take 1 tablet by mouth every 8 (eight) hours as needed for severe pain.    Marland Kitchen ibuprofen (ADVIL,MOTRIN) 200 MG tablet Take 400-800 mg by mouth every 6 (six) hours as needed (for pain).    . LORazepam (ATIVAN) 0.5 MG tablet Take 1 tablet (0.5 mg total) by mouth 2 (two) times daily as needed for anxiety. 60 tablet 2  . Multiple Vitamin (MULTIVITAMIN WITH MINERALS) TABS tablet Take 1 tablet by mouth daily.    . norgestimate-ethinyl estradiol (ORTHO-CYCLEN,SPRINTEC,PREVIFEM) 0.25-35 MG-MCG tablet Take 1 tablet by mouth daily.    . Omega-3 Fatty Acids (FISH OIL PO) Take 1 capsule by mouth  daily.    Marland Kitchen omeprazole (PRILOSEC OTC) 20 MG tablet Take 20 mg by mouth daily as needed (for acid reflux).     . propranolol ER (INDERAL LA) 60 MG 24 hr capsule Take 1 capsule (60 mg total) by mouth at bedtime. 30 capsule 2  . sertraline (ZOLOFT) 100 MG tablet Take 200 mg by mouth daily.     . traZODone (DESYREL) 50 MG tablet Take 50 mg by mouth at bedtime.   2   No facility-administered medications prior to visit.    PE: Blood pressure 115/110, pulse 67, temperature 97.6 F (36.4 C), temperature source Temporal, height 5\' 6"  (1.676 m), weight 233 lb (105.688 kg), SpO2 97 %. Pt examined with Estelle June, as chaperone. Gen: Alert, well appearing.  Patient is oriented to person, place, time, and situation. Neuro: CN 2-12 intact bilaterally, strength 5/5 in proximal and distal upper extremities and lower extremities bilaterally.  No sensory deficits.  No tremor.  No disdiadochokinesis.  No ataxia.  Upper extremity and lower extremity DTRs symmetric.  No pronator drift. LB with mild/mod TTP in Lumbar and gluteal regions diffusely, also lateral hip regions with very mild tenderness.  ROM of hips and L spine intact.  No significant bruising noted on low back region. Sitting SLR neg bilat.  IMPRESSION AND PLAN:  Low back/glut contusion, with mild LB strain as well. Discussed heat/stretching, ibuprofen 600  mg bid x 7d. Vicodin 5/325, 1-2 q6h prn, #30.  Therapeutic expectations and side effect profile of medication discussed today.  Patient's questions answered. No imaging indicated at this time.  An After Visit Summary was printed and given to the patient.  FOLLOW UP: prn

## 2014-10-04 ENCOUNTER — Other Ambulatory Visit: Payer: Self-pay | Admitting: *Deleted

## 2014-10-04 DIAGNOSIS — I10 Essential (primary) hypertension: Secondary | ICD-10-CM

## 2014-10-04 MED ORDER — AMLODIPINE BESYLATE 10 MG PO TABS: 10.0000 mg | ORAL_TABLET | Freq: Every day | ORAL | Status: DC

## 2014-10-06 ENCOUNTER — Other Ambulatory Visit: Payer: Self-pay | Admitting: *Deleted

## 2014-10-06 DIAGNOSIS — I10 Essential (primary) hypertension: Secondary | ICD-10-CM

## 2014-10-06 MED ORDER — PROPRANOLOL HCL ER 60 MG PO CP24: 60.0000 mg | ORAL_CAPSULE | Freq: Every day | ORAL | Status: DC

## 2014-11-02 ENCOUNTER — Ambulatory Visit (INDEPENDENT_AMBULATORY_CARE_PROVIDER_SITE_OTHER): Payer: Commercial Managed Care - PPO | Admitting: Nurse Practitioner

## 2014-11-02 ENCOUNTER — Ambulatory Visit: Payer: Commercial Managed Care - PPO | Admitting: Nurse Practitioner

## 2014-11-02 ENCOUNTER — Encounter: Payer: Self-pay | Admitting: Nurse Practitioner

## 2014-11-02 VITALS — BP 137/94 | HR 73 | Temp 98.7°F | Ht 66.0 in | Wt 235.0 lb

## 2014-11-02 DIAGNOSIS — F32A Depression, unspecified: Secondary | ICD-10-CM

## 2014-11-02 DIAGNOSIS — R7989 Other specified abnormal findings of blood chemistry: Secondary | ICD-10-CM | POA: Diagnosis not present

## 2014-11-02 DIAGNOSIS — L659 Nonscarring hair loss, unspecified: Secondary | ICD-10-CM

## 2014-11-02 DIAGNOSIS — I10 Essential (primary) hypertension: Secondary | ICD-10-CM

## 2014-11-02 DIAGNOSIS — F329 Major depressive disorder, single episode, unspecified: Secondary | ICD-10-CM

## 2014-11-02 LAB — COMPREHENSIVE METABOLIC PANEL
ALT: 16 U/L (ref 0–35)
AST: 17 U/L (ref 0–37)
Albumin: 4.4 g/dL (ref 3.5–5.2)
Alkaline Phosphatase: 88 U/L (ref 39–117)
BUN: 21 mg/dL (ref 6–23)
CO2: 25 mEq/L (ref 19–32)
Calcium: 9.7 mg/dL (ref 8.4–10.5)
Chloride: 105 mEq/L (ref 96–112)
Creatinine, Ser: 0.92 mg/dL (ref 0.40–1.20)
GFR: 71.42 mL/min (ref >60.00)
Glucose, Bld: 101 mg/dL — ABNORMAL HIGH (ref 70–99)
Potassium: 3.8 mEq/L (ref 3.5–5.1)
Sodium: 136 mEq/L (ref 135–145)
Total Bilirubin: 0.3 mg/dL (ref 0.2–1.2)
Total Protein: 7.5 g/dL (ref 6.0–8.3)

## 2014-11-02 LAB — CBC WITH DIFFERENTIAL/PLATELET
Basophils Absolute: 0 10*3/uL (ref 0.0–0.1)
Basophils Relative: 0.4 % (ref 0.0–3.0)
Eosinophils Absolute: 0.1 10*3/uL (ref 0.0–0.7)
Eosinophils Relative: 1.6 % (ref 0.0–5.0)
HCT: 41.1 % (ref 36.0–46.0)
Hemoglobin: 14.1 g/dL (ref 12.0–15.0)
Lymphocytes Relative: 25.7 % (ref 12.0–46.0)
Lymphs Abs: 2 10*3/uL (ref 0.7–4.0)
MCHC: 34.2 g/dL (ref 30.0–36.0)
MCV: 89.6 fl (ref 78.0–100.0)
Monocytes Absolute: 0.4 10*3/uL (ref 0.1–1.0)
Monocytes Relative: 5.3 % (ref 3.0–12.0)
Neutro Abs: 5.3 10*3/uL (ref 1.4–7.7)
Neutrophils Relative %: 67 % (ref 43.0–77.0)
Platelets: 343 10*3/uL (ref 150.0–400.0)
RBC: 4.59 Mil/uL (ref 3.87–5.11)
RDW: 12.5 % (ref 11.5–15.5)
WBC: 7.9 10*3/uL (ref 4.0–10.5)

## 2014-11-02 LAB — VITAMIN D 25 HYDROXY (VIT D DEFICIENCY, FRACTURES): VITD: 45.58 ng/mL (ref 30.00–100.00)

## 2014-11-02 LAB — SEDIMENTATION RATE: Sed Rate: 20 mm/hr (ref 0–22)

## 2014-11-02 LAB — TSH: TSH: 1.51 u[IU]/mL (ref 0.35–4.50)

## 2014-11-02 MED ORDER — PROPRANOLOL HCL ER 80 MG PO CP24
80.0000 mg | ORAL_CAPSULE | Freq: Every day | ORAL | Status: DC
Start: 2014-11-02 — End: 2015-04-21

## 2014-11-02 NOTE — Progress Notes (Signed)
Subjective:     [name] is a 44 y.o. female presents with worse depression, hair loss, rash, & f/u of HTN. She had aneurysm coil revision last Nov. Depression:Historically treated by Dr Caprice Beaver. Had CBT for 10 yrs. Current meds ativan 0.5 mg qam, zoloft 200 mg qd X 10 yrs. Recently added-trazodone 50 mg for insomnia X 6 mos. Topamax 100mg  qhs added 1 mo. Ago by Dr lewitt for HA. Pt states depression is worsening-crying more & feeling angry. Has fear r/t aneurysm recurrence. Feels lonely as husband works 2nd shift. She requests referral for psych as Dr Caprice Beaver is no longer in her network. Also requests ref for CBT.  She reports occasionally feeling hopeless. Denies SI.  Hair loss: duration-1 month-hair dresser noticed. Went to derm, took scrapings. She has not heard back. Derm thought might be r/t recent anesthesia. Associated symptom: scaly patch skin R breast, itchy. Some improvement w/ cortisone. Interesting that she also had low calcium again when labs were done in Nov for coiling. Hair loss & "eczema type" rash could be symptom. HTN: fair control. Increased propranolol at last ov. No intol SE. Current meds: amlodopine 10 mg, propranolol LA 60 mg. I stopped HCTZ last summer due to low potassium. Allergy to lisinopril-angioedema. She has had wt gain. Not exercising.  The following portions of the patient's history were reviewed and updated as appropriate: allergies, current medications, past family history, past medical history, past social history, past surgical history and problem list.  Review of Systems Constitutional: negative for fatigue and fevers Respiratory: positive for occasional wheeze Cardiovascular: negative for irregular heart beat Gastrointestinal: positive for constipation Musculoskeletal:negative for muscle weakness and myalgias Neurological: decrease in HA frequency since started topamax. Tylenol effective most of the time. used lidocaine spray once with  relief. Behavioral/Psych: positive for depression and irritability, negative for excessive alcohol consumption and illegal drug usage    Objective:    BP 137/94 mmHg  Pulse 73  Temp(Src) 98.7 F (37.1 C) (Temporal)  Ht 5\' 6"  (1.676 m)  Wt 235 lb (106.595 kg)  BMI 37.95 kg/m2  SpO2 99% BP 137/94 mmHg  Pulse 73  Temp(Src) 98.7 F (37.1 C) (Temporal)  Ht 5\' 6"  (1.676 m)  Wt 235 lb (106.595 kg)  BMI 37.95 kg/m2  SpO2 99% General appearance: alert, cooperative, appears stated age and no distress Head: Normocephalic, without obvious abnormality, atraumatic Eyes: negative findings: lids and lashes normal and conjunctivae and sclerae normal Lungs: clear to auscultation bilaterally Heart: regular rate and rhythm, S1, S2 normal, no murmur, click, rub or gallop Skin: flaky area on R areola-about 2 cm Lymph nodes: Cervical adenopathy: none and Supraclavicular adenopathy: none Neurologic: Grossly normal    Assessment:Plan     1. Essential hypertension, benign, exacerbation Increase propranolol - propranolol ER (INDERAL LA) 80 MG 24 hr capsule; Take 1 capsule (80 mg total) by mouth at bedtime.  Dispense: 30 capsule; Refill: 5  2. Hair loss, new DD: hypocalcemia, AI disease, r/t anesthesia - CBC with Differential/Platelet - Comprehensive metabolic panel - PTH, intact and calcium - TSH - Vit D  25 hydroxy (rtn osteoporosis monitoring) - Antinuclear Antibodies, IFA - Sedimentation rate  3. Low serum calcium, recurrent Possibly r/t Symptoms: hair loss, rash - CBC with Differential/Platelet - Comprehensive metabolic panel - PTH, intact and calcium - TSH - Vit D  25 hydroxy (rtn osteoporosis monitoring) - Antinuclear Antibodies, IFA - Sedimentation rate Keep appt w/Dr Cruzita Lederer this mo.  4. Depressed, exacerbated Continue meds - Ambulatory  referral to Psychiatry - Ambulatory referral to [facility]  F/u 3 mos: lipids, HTN

## 2014-11-02 NOTE — Assessment & Plan Note (Signed)
Repeat PTH, CMET, vit d, TSH today Keep appt w/Dr Cruzita Lederer this month Return 24 hr urine collection.

## 2014-11-02 NOTE — Patient Instructions (Signed)
I have increased propranolol to 80 mg daily. Continue amlodipine.  My office will call with lab results.  Please return 24 hour urine collection to Dr Arman Filter office & keep appointment with her this month.  Please see counselor & psychiatry. Let me know if you need meds refilled.  Please schedule fasting appointment to check lipids.  Nice to see you!

## 2014-11-02 NOTE — Assessment & Plan Note (Signed)
DD: hypocalcemia, AI disease, thyroid disease, idiopathic, post effects of anesthesia Labs: repeat Ca, PTH, CMET, Vit D CBC, ANA, ESR to screen for AI disease. Last ANA was NEg.  Fam Hx: mother had SLE.

## 2014-11-02 NOTE — Assessment & Plan Note (Signed)
Ref for psych per pt request Ref for behavioral therapy

## 2014-11-02 NOTE — Assessment & Plan Note (Signed)
Increase propranolol to 80 mg qd. Continue norvasc. Needs diet & exercise changes. Discussed cutting out refined carbs. cmet today F/u 3 mos

## 2014-11-02 NOTE — Progress Notes (Signed)
Pre visit review using our clinic review tool, if applicable. No additional management support is needed unless otherwise documented below in the visit note. 

## 2014-11-02 NOTE — Progress Notes (Deleted)
Pre visit review using our clinic review tool, if applicable. No additional management support is needed unless otherwise documented below in the visit note. 

## 2014-11-03 LAB — ANTINUCLEAR ANTIBODIES, IFA: ANA Titer 1: NEGATIVE

## 2014-11-03 LAB — PTH, INTACT AND CALCIUM
Calcium: 9.7 mg/dL (ref 8.4–10.5)
PTH: 28 pg/mL (ref 14–64)

## 2014-11-04 ENCOUNTER — Telehealth: Payer: Self-pay | Admitting: Nurse Practitioner

## 2014-11-04 NOTE — Telephone Encounter (Signed)
Called and informed patient of results. F/u appointment made. Patient will CB with any questions or concerns

## 2014-11-04 NOTE — Telephone Encounter (Signed)
pls call pt: Advise All labs look good. Blood counts are back up.  Calcium, Parathyroid & thyroid hormone are nml. Vitamin D looks good-keep taking 1000 iu D3 4 to 5 days/week with meal. ANA still negative.  I will see her in 3 mos. She should call if BP is dropping under 90/60 or does not feel well with BP med increase.

## 2014-11-04 NOTE — Telephone Encounter (Signed)
LMOVM for patient to return call concerning lab results.  

## 2014-11-14 ENCOUNTER — Encounter: Payer: Self-pay | Admitting: *Deleted

## 2014-11-14 ENCOUNTER — Emergency Department (INDEPENDENT_AMBULATORY_CARE_PROVIDER_SITE_OTHER)
Admission: EM | Admit: 2014-11-14 | Discharge: 2014-11-14 | Disposition: A | Discharge status: Home / Self Care | Payer: Commercial Managed Care - PPO | Source: Home / Self Care | Attending: Family Medicine | Admitting: Family Medicine

## 2014-11-14 DIAGNOSIS — J9801 Acute bronchospasm: Secondary | ICD-10-CM

## 2014-11-14 DIAGNOSIS — J029 Acute pharyngitis, unspecified: Secondary | ICD-10-CM | POA: Diagnosis not present

## 2014-11-14 DIAGNOSIS — J209 Acute bronchitis, unspecified: Secondary | ICD-10-CM | POA: Diagnosis not present

## 2014-11-14 LAB — POCT RAPID STREP A (OFFICE): Rapid Strep A Screen: NEGATIVE

## 2014-11-14 MED ORDER — PREDNISONE 20 MG PO TABS: 20.0000 mg | ORAL_TABLET | Freq: Two times a day (BID) | ORAL | Status: DC

## 2014-11-14 MED ORDER — AMOXICILLIN 875 MG PO TABS: 875.0000 mg | ORAL_TABLET | Freq: Two times a day (BID) | ORAL | Status: DC

## 2014-11-14 MED ORDER — ALBUTEROL SULFATE HFA 108 (90 BASE) MCG/ACT IN AERS: 2.0000 | INHALATION_SPRAY | RESPIRATORY_TRACT | Status: DC | PRN

## 2014-11-14 MED ORDER — GUAIFENESIN-CODEINE 100-10 MG/5ML PO SOLN: ORAL | Status: DC

## 2014-11-14 NOTE — Discharge Instructions (Signed)
Take plain guaifenesin (1200mg  extended release tabs such as Mucinex) twice daily, with plenty of water, for cough and congestion.  Get adequate rest.   May use Afrin nasal spray (or generic oxymetazoline) twice daily for about 5 days.  Also recommend using saline nasal spray several times daily and saline nasal irrigation (AYR is a common brand).  Use Flonase nasal spray each morning after using Afrin nasal spray and saline nasal irrigation. Try warm salt water gargles for sore throat.  Stop all antihistamines for now, and other non-prescription cough/cold preparations. Follow-up with family doctor if not improving about one week.[name]

## 2014-11-14 NOTE — ED Notes (Signed)
Pt c/o productive cough, body aches, and sore throat x 8 days.

## 2014-11-14 NOTE — ED Provider Notes (Signed)
CSN: [id number]     Arrival date & time 11/14/14  1340 History   First MD Initiated Contact with Patient 11/14/14 1410     Chief Complaint  Patient presents with  . Cough  . Generalized Body Aches      HPI Comments: Patient complains of eight day history of typical cold-like symptoms including mild sore throat, sinus congestion, headache, fatigue, and cough.  As the week progressed, she developed wheezing and shortness of breath with activity, and has maintained a low grade fever.  She has a past history of exercise asthma as a child, and had used an albuterol inhaler in the past.  The history is provided by the patient.    Past Medical History  Diagnosis Date  . PONV (postoperative nausea and vomiting)   . Hyperlipidemia     takes Atorvastatin daily  . Headache(784.0)   . 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 Hypertension     takes Amlodipine and Propranolol daily  . Anxiety     takes Ativan daily as needed  . Depression     takes Zoloft daily  . Insomnia     takes Trazodone at bedtime  . GERD (gastroesophageal reflux disease)     takes Omeprazole daily  . Arthritis    Past Surgical History  Procedure Laterality Date  . Myomectomy  2009  . Laparotomy    . Myomectomy  05/16/2011    Procedure: MYOMECTOMY;  Surgeon: Margarette Asal;  Location: [facility];  Service: Gynecology;  Laterality: N/A;  abdominal  . Radiology with anesthesia N/A 02/18/2013    Procedure: RADIOLOGY WITH ANESTHESIA;  Surgeon: Rob Hickman, MD;  Location: [facility];  Service: Radiology;  Laterality: N/A;  . Brain surgery    . Radiology with anesthesia N/A 01/26/2014    Procedure: RADIOLOGY WITH ANESTHESIA;  Surgeon: Rob Hickman, MD;  Location: [facility];  Service: Radiology;  Laterality: N/A;  . Dilation and curettage of uterus  2009  . Radiology with anesthesia N/A 07/25/2014    Procedure: RADIOLOGY WITH ANESTHESIA;  Surgeon: Rob Hickman, MD;   Location: [facility];  Service: Radiology;  Laterality: N/A;   Family History  Problem Relation Age of Onset  . Heart disease Mother 14    cad  . Lupus Mother   . COPD Mother   . Arthritis Mother     rheumatoid  . AVM Mother   . Diabetes Mother   . Diabetes Father   . Cancer Father     thyroid  . Depression Sister   . Diabetes Sister   . Hyperlipidemia Sister   . Hypertension Sister   . Cancer Paternal Grandfather     colon   History  Substance Use Topics  . Smoking status: Never Smoker   . Smokeless tobacco: Never Used  . Alcohol Use: Yes     Comment: wine and beer on occasion   OB History    No data available     Review of Systems + sore throat + cough No pleuritic pain, but has tightness in anterior chest. + wheezing + nasal congestion + post-nasal drainage No sinus pain/pressure No itchy/red eyes No earache No hemoptysis + SOB + fever, + chills No nausea No vomiting No abdominal pain No diarrhea No urinary symptoms No skin rash + fatigue + myalgias + headache Used OTC meds without relief  Allergies  Ace inhibitors  Home Medications   Prior  to Admission medications   Medication Sig Start Date End Date Taking? Authorizing Provider  albuterol (PROVENTIL HFA;VENTOLIN HFA) 108 (90 BASE) MCG/ACT inhaler Inhale 2 puffs into the lungs every 4 (four) hours as needed for wheezing or shortness of breath. 11/14/14   Kandra Nicolas, MD  amLODipine (NORVASC) 10 MG tablet Take 1 tablet (10 mg total) by mouth daily. 10/04/14   Irene Pap, NP  amoxicillin (AMOXIL) 875 MG tablet Take 1 tablet (875 mg total) by mouth 2 (two) times daily. 11/14/14   Kandra Nicolas, MD  aspirin EC 325 MG tablet Take 325 mg by mouth daily.    Historical Provider, MD  atorvastatin (LIPITOR) 40 MG tablet Take 1 tablet (40 mg total) by mouth daily. 09/05/14   Irene Pap, NP  clindamycin (CLINDAGEL) 1 % gel  10/25/14   Historical Provider, MD  clobetasol (TEMOVATE) 0.05 % external  solution  10/25/14   Historical Provider, MD  guaiFENesin-codeine 100-10 MG/5ML syrup Take 49mL by mouth at bedtime as needed for cough 11/14/14   Kandra Nicolas, MD  HYDROcodone-acetaminophen (NORCO/VICODIN) 5-325 MG per tablet Take 1-2 tablets by mouth every 6 (six) hours as needed for severe pain. 09/26/14   Tammi Sou, MD  ibuprofen (ADVIL,MOTRIN) 200 MG tablet Take 400-800 mg by mouth every 6 (six) hours as needed (for pain).    Historical Provider, MD  LORazepam (ATIVAN) 0.5 MG tablet Take 1 tablet (0.5 mg total) by mouth 2 (two) times daily as needed for anxiety. 03/08/13   Marletta Lor, MD  Multiple Vitamin (MULTIVITAMIN WITH MINERALS) TABS tablet Take 1 tablet by mouth daily.    Historical Provider, MD  norgestimate-ethinyl estradiol (ORTHO-CYCLEN,SPRINTEC,PREVIFEM) 0.25-35 MG-MCG tablet Take 1 tablet by mouth daily.    Historical Provider, MD  Omega-3 Fatty Acids (FISH OIL PO) Take 1 capsule by mouth daily.    Historical Provider, MD  omeprazole (PRILOSEC OTC) 20 MG tablet Take 20 mg by mouth daily as needed (for acid reflux).     Historical Provider, MD  predniSONE (DELTASONE) 20 MG tablet Take 1 tablet (20 mg total) by mouth 2 (two) times daily. Take with food. 11/14/14   Kandra Nicolas, MD  propranolol ER (INDERAL LA) 80 MG 24 hr capsule Take 1 capsule (80 mg total) by mouth at bedtime. 11/02/14   Irene Pap, NP  sertraline (ZOLOFT) 100 MG tablet Take 200 mg by mouth daily.     Historical Provider, MD  topiramate (TOPAMAX) 25 MG tablet  10/27/14   Historical Provider, MD  traZODone (DESYREL) 50 MG tablet Take 50 mg by mouth at bedtime.  06/27/14   Historical Provider, MD   BP 115/81 mmHg  Pulse 70  Temp(Src) 98.8 F (37.1 C) (Oral)  Resp 18  Ht 5\' 6"  (1.676 m)  Wt 234 lb (106.142 kg)  BMI 37.79 kg/m2  SpO2 99% Physical Exam Nursing notes and Vital Signs reviewed. Appearance:  Patient appears stated age, and in no acute distress.  Patient is obese (BMI 37.8) Eyes:   Pupils are equal, round, and reactive to light and accomodation.  Extraocular movement is intact.  Conjunctivae are not inflamed  Ears:  Canals normal.  Tympanic membranes normal.  Nose:  Mildly congested turbinates.  No sinus tenderness.  Pharynx:   Minimal erythema Neck:  Supple.  Tender enlarged posterior nodes are palpated bilaterally  Lungs:   Bibasilar expiratory wheezes posteriorly.  Breath sounds are equal. Chest:  Distinct tenderness to palpation over the  mid-sternum.   Heart:  Regular rate and rhythm without murmurs, rubs, or gallops.  Abdomen:  Nontender without masses or hepatosplenomegaly.  Bowel sounds are present.  No CVA or flank tenderness.  Extremities:  No edema.  No calf tenderness Skin:  No rash present.   ED Course  Procedures  None   Labs Reviewed  POCT RAPID STREP A (OFFICE) negative      MDM   1. Acute bronchitis, unspecified organism   2. Bronchospasm    Begin amoxicillin 875mg  BID, prednisone burst, albuterol MDI prn, and Robitussin AC at bedtime prn. Take plain guaifenesin (1200mg  extended release tabs such as Mucinex) twice daily, with plenty of water, for cough and congestion.  Get adequate rest.   May use Afrin nasal spray (or generic oxymetazoline) twice daily for about 5 days.  Also recommend using saline nasal spray several times daily and saline nasal irrigation (AYR is a common brand).  Use Flonase nasal spray each morning after using Afrin nasal spray and saline nasal irrigation. Try warm salt water gargles for sore throat.  Stop all antihistamines for now, and other non-prescription cough/cold preparations. Follow-up with family doctor if not improving about one week.Marland Kitchen    Kandra Nicolas, MD 11/14/14 1434

## 2014-11-15 ENCOUNTER — Telehealth (HOSPITAL_COMMUNITY): Payer: Self-pay | Admitting: Interventional Radiology

## 2014-11-15 NOTE — Telephone Encounter (Signed)
Called pt to check on her medications. She states she did in fact stop the Plavix 75mg  after her last bottle ran out. She continues, as requested per Deveshwar, on Aspirin 325mg  1 QD. She is due a f/u MRI/MRA this month. I will call her to schedule this once I receive her insurance approval. Patient states understanding and is in agreement w/ this plan of care. JM

## 2014-11-16 ENCOUNTER — Other Ambulatory Visit: Payer: Self-pay | Admitting: Nurse Practitioner

## 2014-11-16 ENCOUNTER — Other Ambulatory Visit: Payer: Commercial Managed Care - PPO

## 2014-11-16 DIAGNOSIS — Z6837 Body mass index (BMI) 37.0-37.9, adult: Secondary | ICD-10-CM

## 2014-11-24 ENCOUNTER — Ambulatory Visit: Payer: Commercial Managed Care - PPO | Admitting: Internal Medicine

## 2014-11-29 ENCOUNTER — Other Ambulatory Visit (INDEPENDENT_AMBULATORY_CARE_PROVIDER_SITE_OTHER): Payer: Commercial Managed Care - PPO

## 2014-11-29 DIAGNOSIS — Z6837 Body mass index (BMI) 37.0-37.9, adult: Secondary | ICD-10-CM

## 2014-11-29 DIAGNOSIS — R7989 Other specified abnormal findings of blood chemistry: Secondary | ICD-10-CM

## 2014-11-29 DIAGNOSIS — E669 Obesity, unspecified: Secondary | ICD-10-CM | POA: Diagnosis not present

## 2014-11-29 LAB — LIPID PANEL
Cholesterol: 221 mg/dL — ABNORMAL HIGH (ref 0–200)
HDL: 61.7 mg/dL (ref >39.00)
LDL Cholesterol: 120 mg/dL — ABNORMAL HIGH (ref 0–99)
NonHDL: 159.3
Total CHOL/HDL Ratio: 4
Triglycerides: 195 mg/dL — ABNORMAL HIGH (ref 0.0–149.0)
VLDL: 39 mg/dL (ref 0.0–40.0)

## 2014-11-29 LAB — HEMOGLOBIN A1C: Hgb A1c MFr Bld: 5.7 % (ref 4.6–6.5)

## 2014-12-05 ENCOUNTER — Telehealth: Payer: Self-pay | Admitting: Nurse Practitioner

## 2014-12-05 NOTE — Telephone Encounter (Signed)
pls call pt: Advise A1C indicates she has developed prediabetes. This is reversible with diet changes. Cholesterol panel shows triglycerides & bad cholesterol are too high. These are also reversible with diet changes.  Cutting out refined sugar & grains will normalize both of these numbers. She may schedule appointment to discuss if she has questions.

## 2014-12-05 NOTE — Telephone Encounter (Signed)
Called and informed patient of results. Patient did not want to schedule an appointment and will CB with any questions regarding diet.

## 2014-12-07 ENCOUNTER — Other Ambulatory Visit (HOSPITAL_COMMUNITY): Payer: Self-pay | Admitting: Interventional Radiology

## 2014-12-07 DIAGNOSIS — I609 Nontraumatic subarachnoid hemorrhage, unspecified: Secondary | ICD-10-CM

## 2014-12-07 DIAGNOSIS — I671 Cerebral aneurysm, nonruptured: Secondary | ICD-10-CM

## 2014-12-19 ENCOUNTER — Encounter (HOSPITAL_COMMUNITY): Payer: Commercial Managed Care - PPO | Admitting: Psychiatry

## 2014-12-19 NOTE — Progress Notes (Signed)
This encounter was created in error - please disregard.

## 2014-12-20 ENCOUNTER — Ambulatory Visit (HOSPITAL_COMMUNITY)
Admission: RE | Admit: 2014-12-20 | Discharge: 2014-12-20 | Disposition: A | Discharge status: Home / Self Care | Payer: Commercial Managed Care - PPO | Source: Ambulatory Visit | Attending: Interventional Radiology | Admitting: Interventional Radiology

## 2014-12-20 DIAGNOSIS — I671 Cerebral aneurysm, nonruptured: Secondary | ICD-10-CM | POA: Diagnosis not present

## 2014-12-20 DIAGNOSIS — Z48812 Encounter for surgical aftercare following surgery on the circulatory system: Secondary | ICD-10-CM | POA: Insufficient documentation

## 2014-12-20 DIAGNOSIS — I609 Nontraumatic subarachnoid hemorrhage, unspecified: Secondary | ICD-10-CM

## 2014-12-20 LAB — CREATININE, SERUM
Creatinine, Ser: 0.98 mg/dL (ref 0.50–1.10)
GFR calc Af Amer: 82 mL/min — ABNORMAL LOW (ref >90)
GFR calc non Af Amer: 71 mL/min — ABNORMAL LOW (ref >90)

## 2014-12-20 MED ORDER — GADOBENATE DIMEGLUMINE 529 MG/ML IV SOLN
20.0000 mL | Freq: Once | INTRAVENOUS | Status: AC | PRN
  Administered 2014-12-20: 20 mL via INTRAVENOUS

## 2014-12-29 ENCOUNTER — Other Ambulatory Visit: Payer: Self-pay

## 2014-12-29 DIAGNOSIS — Z6836 Body mass index (BMI) 36.0-36.9, adult: Secondary | ICD-10-CM

## 2014-12-29 DIAGNOSIS — E039 Hypothyroidism, unspecified: Secondary | ICD-10-CM

## 2014-12-29 MED ORDER — ATORVASTATIN CALCIUM 40 MG PO TABS: 40.0000 mg | ORAL_TABLET | Freq: Every day | ORAL | Status: DC

## 2014-12-29 NOTE — Telephone Encounter (Signed)
Please Advise Refill Request? Refill request for- Atorvastatin 40mg  Last filled by MD on - 09/05/14 Last Appt - 11/02/14        Next Appt - 01/31/15 Pharmacy- [facility]

## 2015-01-04 ENCOUNTER — Ambulatory Visit (HOSPITAL_COMMUNITY): Payer: Commercial Managed Care - PPO | Admitting: Psychiatry

## 2015-01-05 ENCOUNTER — Encounter: Payer: Self-pay | Admitting: Internal Medicine

## 2015-01-05 ENCOUNTER — Ambulatory Visit (INDEPENDENT_AMBULATORY_CARE_PROVIDER_SITE_OTHER): Payer: Commercial Managed Care - PPO | Admitting: Internal Medicine

## 2015-01-05 VITALS — BP 114/62 | HR 68 | Temp 97.9°F | Resp 12 | Wt 233.0 lb

## 2015-01-05 DIAGNOSIS — R7989 Other specified abnormal findings of blood chemistry: Secondary | ICD-10-CM | POA: Diagnosis not present

## 2015-01-05 NOTE — Progress Notes (Signed)
Patient ID: [name], female   DOB: 12/08/1970, 44 y.o.   MRN: [id number]   HPI  [name] is a 44 y.o.-year-old female, referred by her PCP, WEAVER, LAYNE C, NP , in consultation for hypocalcemia/hyperparathyroidism.   Pt had 2 instances of low calcium in 01/2013 and 12/2013. Outside these low values, her calcium levels have been normal. An  iPTH checked by the [facility] lab was elevated, but the [facility] assay showed a low PTH, at 13. A subsequent PTH was normal. Calcium levels are normal.  I reviewed pt's pertinent labs: Lab Results  Component Value Date   PTH 28 11/02/2014   PTH 13* 05/26/2014   PTH 483* 05/04/2014   CALCIUM 9.7 11/02/2014   CALCIUM 9.7 11/02/2014   CALCIUM 8.0* 07/26/2014   CALCIUM 9.5 07/22/2014   CALCIUM 9.8 06/24/2014   CALCIUM 9.7 05/26/2014   CALCIUM 9.4 05/04/2014   CALCIUM 9.6 05/04/2014   CALCIUM 7.8* 01/27/2014   CALCIUM 9.7 01/19/2014   No perioral numbness or hand cramping.  No h/o kidney stones.  No h/o CKD. Last BUN/Cr: Lab Results  Component Value Date   BUN 21 11/02/2014   CREATININE 0.98 12/20/2014   Pt is on HCTZ - started in 12/2013.  No h/o vitamin D deficiency. vit D level were  - 45.58 in 11/02/2014 - 63.6 in 05/04/2014.  Pt is on calcium and vitamin D from 1 tab MVI a day; she also eats dairy (milk or cheese) and green, leafy, vegetables (more salads 3-5x a week).  No h/o malabsorption, celiac disease, or h/o GBP. She has diverticulosis and IBS.  Reviewed last visit's lab results:  Component     Latest Ref Rng 05/26/2014          Sodium     135 - 145 mEq/L 136  Potassium     3.5 - 5.1 mEq/L 3.9  Chloride     96 - 112 mEq/L 100  CO2     19 - 32 mEq/L 29  Glucose     70 - 99 mg/dL 107 (H)  BUN     6 - 23 mg/dL 20  Creatinine     0.4 - 1.2 mg/dL 1.1  Calcium     8.4 - 10.5 mg/dL 9.7  GFR     >60.00 mL/min 60.13  Vitamin D 1, 25 (OH) Total     18 - 72 pg/mL 79 (H)  Vitamin D3 1, 25 (OH)     79  Vitamin D2 1, 25 (OH)      <8  PTH     15 - 65 pg/mL 13 (L)  Gliadin IgG     <20 U/mL 5.0  Gliadin IgA     <20 U/mL 6.6  Reticulin Ab, IgA     NEGATIVE NEGATIVE  Reticulin IgA titer     <1:2.5 SEE NOTE  Calcium Ionized     1.12 - 1.32 mmol/L 1.31  Phosphorus     2.3 - 4.6 mg/dL 3.2  Tissue Transglutaminase Ab, IgA     <20 U/mL 7.2  Magnesium     1.5 - 2.5 mg/dL 2.0  Calcium normal.  Mg and phos normal. Vit D 1,25 dihydroxy is a little high. Celiac w/u was negative.   She did not perform the urine collection as originally advised, due to normal bloodwork.   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 This year: stent and  coiling in 12/2013 >> had recanalization  + coiling 07/2014.  ROS: Constitutional: no weight gain/loss, + fatigue, no subjective hyperthermia/hypothermia Eyes: no blurry vision, no xerophthalmia ENT: no sore throat, no nodules palpated in throat, no dysphagia/odynophagia, no hoarseness Cardiovascular: no CP/SOB/palpitations/leg swelling Respiratory: no cough/SOB Gastrointestinal: no N/V/D/C Musculoskeletal: no muscle/joint aches Skin: no rashes, + hair loss Neurological: no tremors/numbness/tingling/dizziness  I reviewed pt's medications, allergies, PMH, social hx, family hx, and changes were documented in the history of present illness. Otherwise, unchanged from my initial visit note. Stopped Plavix and Zoloft >> ASA, Topamax and Cymbalta; Propranolol dose increased.  Past Medical History  Diagnosis Date  . PONV (postoperative nausea and vomiting)   . Hyperlipidemia     takes Atorvastatin daily  . Headache(784.0)   . 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 Hypertension     takes Amlodipine and Propranolol daily  . Anxiety     takes Ativan daily as needed  . Depression     takes Zoloft daily  . Insomnia     takes Trazodone at bedtime  . GERD  (gastroesophageal reflux disease)     takes Omeprazole daily  . Arthritis    Past Surgical History  Procedure Laterality Date  . Myomectomy  2009  . Laparotomy    . Myomectomy  05/16/2011    Procedure: MYOMECTOMY;  Surgeon: Margarette Asal;  Location: [facility];  Service: Gynecology;  Laterality: N/A;  abdominal  . Radiology with anesthesia N/A 02/18/2013    Procedure: RADIOLOGY WITH ANESTHESIA;  Surgeon: Rob Hickman, MD;  Location: [facility];  Service: Radiology;  Laterality: N/A;  . Brain surgery    . Radiology with anesthesia N/A 01/26/2014    Procedure: RADIOLOGY WITH ANESTHESIA;  Surgeon: Rob Hickman, MD;  Location: [facility];  Service: Radiology;  Laterality: N/A;  . Dilation and curettage of uterus  2009  . Radiology with anesthesia N/A 07/25/2014    Procedure: RADIOLOGY WITH ANESTHESIA;  Surgeon: Rob Hickman, MD;  Location: [facility];  Service: Radiology;  Laterality: N/A;   History   Social History  . Marital Status: Married    Spouse Name: N/A    Number of Children: 0   Occupational History  . Controller/accountant    Social History Main Topics  . Smoking status: Never Smoker   . Smokeless tobacco: Never Used  . Alcohol Use: Yes     Comment: occ - wine 1-2 glasses 1-2x a week  . Drug Use: No   Current Outpatient Prescriptions on File Prior to Visit  Medication Sig Dispense Refill  . albuterol (PROVENTIL HFA;VENTOLIN HFA) 108 (90 BASE) MCG/ACT inhaler Inhale 2 puffs into the lungs every 4 (four) hours as needed for wheezing or shortness of breath. 1 Inhaler 0  . amLODipine (NORVASC) 10 MG tablet Take 1 tablet (10 mg total) by mouth daily. 30 tablet 3  . aspirin EC 325 MG tablet Take 325 mg by mouth daily.    Marland Kitchen atorvastatin (LIPITOR) 40 MG tablet Take 1 tablet (40 mg total) by mouth daily. 30 tablet 0  . clindamycin (CLINDAGEL) 1 % gel   2  . clobetasol (TEMOVATE) 0.05 % external solution   2  . ibuprofen (ADVIL,MOTRIN) 200 MG tablet Take 400-800 mg by  mouth every 6 (six) hours as needed (for pain).    . Multiple Vitamin (MULTIVITAMIN WITH MINERALS) TABS tablet Take 1 tablet by mouth daily.    Marland Kitchen  norgestimate-ethinyl estradiol (ORTHO-CYCLEN,SPRINTEC,PREVIFEM) 0.25-35 MG-MCG tablet Take 1 tablet by mouth daily.    . Omega-3 Fatty Acids (FISH OIL PO) Take 1 capsule by mouth daily.    . propranolol ER (INDERAL LA) 80 MG 24 hr capsule Take 1 capsule (80 mg total) by mouth at bedtime. 30 capsule 5  . topiramate (TOPAMAX) 25 MG tablet   1  . traZODone (DESYREL) 50 MG tablet Take 50 mg by mouth at bedtime.   2  . amoxicillin (AMOXIL) 875 MG tablet Take 1 tablet (875 mg total) by mouth 2 (two) times daily. (Patient not taking: Reported on 01/05/2015) 20 tablet 0  . guaiFENesin-codeine 100-10 MG/5ML syrup Take 7mL by mouth at bedtime as needed for cough (Patient not taking: Reported on 01/05/2015) 120 mL 0  . HYDROcodone-acetaminophen (NORCO/VICODIN) 5-325 MG per tablet Take 1-2 tablets by mouth every 6 (six) hours as needed for severe pain. (Patient not taking: Reported on 01/05/2015) 30 tablet 0  . LORazepam (ATIVAN) 0.5 MG tablet Take 1 tablet (0.5 mg total) by mouth 2 (two) times daily as needed for anxiety. (Patient not taking: Reported on 01/05/2015) 60 tablet 2  . omeprazole (PRILOSEC OTC) 20 MG tablet Take 20 mg by mouth daily as needed (for acid reflux).     . predniSONE (DELTASONE) 20 MG tablet Take 1 tablet (20 mg total) by mouth 2 (two) times daily. Take with food. (Patient not taking: Reported on 01/05/2015) 10 tablet 0  . sertraline (ZOLOFT) 100 MG tablet Take 200 mg by mouth daily.      No current facility-administered medications on file prior to visit.   Allergies  Allergen Reactions  . Ace Inhibitors Swelling and Other (See Comments)    Angioedema    Family History  Problem Relation Age of Onset  . Heart disease Mother 61    cad  . Lupus Mother   . COPD Mother   . Arthritis Mother     rheumatoid  . AVM Mother   . Diabetes Mother   .  Diabetes Father   . Cancer Father     thyroid  . Depression Sister   . Diabetes Sister   . Hyperlipidemia Sister   . Hypertension Sister   . Cancer Paternal Grandfather     colon   PE: BP 114/62 mmHg  Pulse 68  Temp(Src) 97.9 F (36.6 C) (Oral)  Resp 12  Wt 233 lb (105.688 kg)  SpO2 97% Body mass index is 37.63 kg/(m^2). Wt Readings from Last 3 Encounters:  01/05/15 233 lb (105.688 kg)  11/14/14 234 lb (106.142 kg)  11/02/14 235 lb (106.595 kg)   Constitutional: overweight, in NAD Eyes: PERRLA, EOMI, no exophthalmos ENT: moist mucous membranes, no thyromegaly, no cervical lymphadenopathy Cardiovascular: RRR, No MRG Respiratory: CTA B Gastrointestinal: abdomen soft, NT, ND, BS+ Musculoskeletal: no deformities, strength intact in all 4 Skin: moist, warm, no rashes Neurological: no tremor with outstretched hands, DTR normal in all 4  Assessment: 1. Hypocalcemia/hyperparathyroidism  Plan: Patient has had 2 instances of low calcium over the last 1.5 years, with the lowest level being at 7.7. An intact PTH level was very fluctuating ([facility] assay >> high, then [facility] assay >> low, then [facility] assay >> normal). No vitamin D deficiency. - No apparent signs/sxs from hypocalcemia: no perioral numbness, no acral cramping; [place] sign negative. - we reviewed previous w/up from last visit >> normal - due to PTH, calcium and vit D being normal 2 mo ago >> will not repeat. No  further investigation necessary >> return if calcium consistently low in the future - continue her MVI for now - RTC on an as needed basis

## 2015-01-05 NOTE — Patient Instructions (Signed)
Please return to clinic as needed, if the calcium returns to being low consistently. Keep an eye on the vit D level. Continue 1 MVI a day.

## 2015-01-24 ENCOUNTER — Telehealth: Payer: Self-pay | Admitting: Family Medicine

## 2015-01-24 DIAGNOSIS — Z6836 Body mass index (BMI) 36.0-36.9, adult: Secondary | ICD-10-CM

## 2015-01-24 DIAGNOSIS — I10 Essential (primary) hypertension: Secondary | ICD-10-CM

## 2015-01-24 DIAGNOSIS — E039 Hypothyroidism, unspecified: Secondary | ICD-10-CM

## 2015-01-24 MED ORDER — AMLODIPINE BESYLATE 10 MG PO TABS: 10.0000 mg | ORAL_TABLET | Freq: Every day | ORAL | Status: DC

## 2015-01-24 MED ORDER — ATORVASTATIN CALCIUM 40 MG PO TABS: 40.0000 mg | ORAL_TABLET | Freq: Every day | ORAL | Status: DC

## 2015-01-24 NOTE — Telephone Encounter (Signed)
pls send as refill reqst-I will fill 30 days.

## 2015-01-24 NOTE — Telephone Encounter (Signed)
Rf request for amlodipine 10mg  and atorvastatin 40mg .   Last OV was 11/02/14 Next OV 01/31/15 Please advise.

## 2015-01-24 NOTE — Telephone Encounter (Signed)
I sent them into pharmacy.  I am not able to send as refill requests, when I start it as refill request and route it to you, it makes me change to telephone note.

## 2015-01-31 ENCOUNTER — Ambulatory Visit (INDEPENDENT_AMBULATORY_CARE_PROVIDER_SITE_OTHER): Payer: Commercial Managed Care - PPO | Admitting: Nurse Practitioner

## 2015-01-31 ENCOUNTER — Encounter: Payer: Self-pay | Admitting: Nurse Practitioner

## 2015-01-31 VITALS — BP 113/77 | HR 61 | Temp 98.0°F | Ht 66.0 in | Wt 230.0 lb

## 2015-01-31 DIAGNOSIS — E8881 Metabolic syndrome: Secondary | ICD-10-CM | POA: Insufficient documentation

## 2015-01-31 DIAGNOSIS — K5909 Other constipation: Secondary | ICD-10-CM | POA: Diagnosis not present

## 2015-01-31 DIAGNOSIS — I10 Essential (primary) hypertension: Secondary | ICD-10-CM | POA: Diagnosis not present

## 2015-01-31 NOTE — Patient Instructions (Signed)
GREAT JOB with weight loss!  Be mindful of best health practices:  Develop lifelong habits of exercise most days of the week: take a 30 minute walk. The benefits include weight loss, lower risk for heart disease, diabetes, stroke, high blood pressure, lower rates of depression & dementia, better sleep quality & bone health.  Continue to cut out refined sugar: anything that is sweet when you eat or drink it, except fresh fruit. Cut out refined grains: white bread, rolls, biscuits, bagels, muffins, pasta and cereals. Choose grains with 4 gm or more of fiber per serving.   Increase fiber to decrease insulin resistance and lower triglycerides and help with constipation: Eat fruits & vegetables every day-at least 5 servings. (Handful is a serving.) Berries are loaded with fiber-eat a handful daily. Add a tablespoon of metamucil mixed with 8oz water daily.  For constipation, you may take a capful miralax daily. You can repeat in 1 hour if no bowel movement.  Please return in a month or so for physical & fasting labs.  Great to see you!

## 2015-01-31 NOTE — Progress Notes (Signed)
Pre visit review using our clinic review tool, if applicable. No additional management support is needed unless otherwise documented below in the visit note. 

## 2015-01-31 NOTE — Progress Notes (Signed)
Subjective:     [name] is a 44 y.o. female presents for f/u htn, hair loss, elevated triglycerides, depression & c/o constipation.  Htn: last OV increased propranolol to 80 mg qd. No intol SE. Good control. Hx cerebral aneurysm w/coil. Lost 3 lbs since last OV. Increased exercise, cut back refined sugars & grains. GFR 71. RF: elevated triglycerides, BMI 37. Hair loss: possibly anesthesia. Went to derm: gave rogaine product-hair coming back in. Will check iron panel at return OV. ANA neg. ESR 20. Metabolic syndrome: elevated glucose & triglycerides, BMi 37, elevated BP. Discussed importance of reversing/normalizing glucose & trig & wt with lifestyle change to prevent DM, fatty liver. Depression: referred to psychiatry at last OV due to worsened mood &thoughts of death. zoloft d/c, started cymblta w/good results. Plans to return to therapy as well. Cymbalta has caused constipation-having BM every 4-5 days. Discussed increasing fiber in diet, using metamucil, & miralax.      The following portions of the patient's history were reviewed and updated as appropriate: allergies, current medications, past medical history, past social history, past surgical history and problem list.  Review of Systems Pertinent items are noted in HPI.    Objective:    BP 113/77 mmHg  Pulse 61  Temp(Src) 98 F (36.7 C) (Oral)  Ht '5\' 6"'  (1.676 m)  Wt 230 lb (104.327 kg)  BMI 37.14 kg/m2  SpO2 98% BP 113/77 mmHg  Pulse 61  Temp(Src) 98 F (36.7 C) (Oral)  Ht '5\' 6"'  (1.676 m)  Wt 230 lb (104.327 kg)  BMI 37.14 kg/m2  SpO2 98% General appearance: alert, cooperative, appears stated age and no distress Head: Normocephalic, without obvious abnormality, atraumatic Eyes: negative findings: lids and lashes normal and conjunctivae and sclerae normal Neck: no adenopathy, no carotid bruit, supple, symmetrical, trachea midline and thyroid not enlarged, symmetric, no tenderness/mass/nodules Lungs: clear to  auscultation bilaterally Heart: regular rate and rhythm, S1, S2 normal, no murmur, click, rub or gallop Neurologic: Grossly normal    Assessment:Plan   1. Essential hypertension Continue current meds Continue lifestyle change  2. Metabolic syndrome Increase fiber Cut out ref sugars & grains Increase exercise  3. Other constipation Daily metamucil-1 TBLS w/8 oz water mirilax PRN : 1 capful w/8 oz water, up to 2 doses /q24hrs PRN daily BM  F/u 1 mo CPE & fasting labs

## 2015-02-20 ENCOUNTER — Other Ambulatory Visit: Payer: Self-pay | Admitting: Family Medicine

## 2015-02-20 ENCOUNTER — Telehealth: Payer: Self-pay | Admitting: Nurse Practitioner

## 2015-02-20 DIAGNOSIS — I10 Essential (primary) hypertension: Secondary | ICD-10-CM

## 2015-02-20 DIAGNOSIS — Z6836 Body mass index (BMI) 36.0-36.9, adult: Secondary | ICD-10-CM

## 2015-02-20 DIAGNOSIS — E039 Hypothyroidism, unspecified: Secondary | ICD-10-CM

## 2015-02-20 MED ORDER — AMLODIPINE BESYLATE 10 MG PO TABS: 10.0000 mg | ORAL_TABLET | Freq: Every day | ORAL | Status: DC

## 2015-02-20 MED ORDER — ATORVASTATIN CALCIUM 40 MG PO TABS: 40.0000 mg | ORAL_TABLET | Freq: Every day | ORAL | Status: DC

## 2015-02-20 NOTE — Telephone Encounter (Signed)
Left detailed message on pt's cell.  Okay per DPR. 

## 2015-02-20 NOTE — Telephone Encounter (Signed)
I sent in 4 mos of norvasc & cholesterol med, but she needs OV in September-Oct., fasting

## 2015-02-23 ENCOUNTER — Telehealth (HOSPITAL_COMMUNITY): Payer: Self-pay | Admitting: Interventional Radiology

## 2015-02-23 NOTE — Telephone Encounter (Signed)
Called pt, left VM for her to call me back about her recent f/u scan and when her next is due. JM

## 2015-04-21 ENCOUNTER — Other Ambulatory Visit: Payer: Self-pay | Admitting: Family Medicine

## 2015-04-21 DIAGNOSIS — I10 Essential (primary) hypertension: Secondary | ICD-10-CM

## 2015-04-21 MED ORDER — PROPRANOLOL HCL ER 80 MG PO CP24: 80.0000 mg | ORAL_CAPSULE | Freq: Every day | ORAL | Status: DC

## 2015-05-04 ENCOUNTER — Other Ambulatory Visit: Payer: Commercial Managed Care - PPO

## 2015-05-11 ENCOUNTER — Encounter: Payer: Self-pay | Admitting: Family Medicine

## 2015-05-11 ENCOUNTER — Ambulatory Visit (INDEPENDENT_AMBULATORY_CARE_PROVIDER_SITE_OTHER): Payer: Commercial Managed Care - PPO | Admitting: Family Medicine

## 2015-05-11 VITALS — BP 118/81 | HR 62 | Temp 98.7°F | Resp 18 | Ht 66.0 in | Wt 234.4 lb

## 2015-05-11 DIAGNOSIS — R5383 Other fatigue: Secondary | ICD-10-CM | POA: Insufficient documentation

## 2015-05-11 DIAGNOSIS — E8881 Metabolic syndrome: Secondary | ICD-10-CM

## 2015-05-11 DIAGNOSIS — K5909 Other constipation: Secondary | ICD-10-CM | POA: Diagnosis not present

## 2015-05-11 DIAGNOSIS — E785 Hyperlipidemia, unspecified: Secondary | ICD-10-CM

## 2015-05-11 DIAGNOSIS — Z23 Encounter for immunization: Secondary | ICD-10-CM

## 2015-05-11 LAB — LIPID PANEL
Cholesterol: 196 mg/dL (ref 0–200)
HDL: 52.7 mg/dL (ref >39.00)
LDL Cholesterol: 107 mg/dL — ABNORMAL HIGH (ref 0–99)
NonHDL: 143.6
Total CHOL/HDL Ratio: 4
Triglycerides: 184 mg/dL — ABNORMAL HIGH (ref 0.0–149.0)
VLDL: 36.8 mg/dL (ref 0.0–40.0)

## 2015-05-11 LAB — VITAMIN B12: Vitamin B-12: 238 pg/mL (ref 211–911)

## 2015-05-11 LAB — HEMOGLOBIN A1C: Hgb A1c MFr Bld: 5.4 % (ref 4.6–6.5)

## 2015-05-11 NOTE — Progress Notes (Signed)
Subjective:    Patient ID: [name], female    DOB: 14-Jan-1971, 44 y.o.   MRN: [id number]  HPI   Patient presents to office to have her complete physical completed, and follow-up on chronic issues.  Metabolic syndrome: Last R9X March 2016 5.7. Patient states she has tried to make healthier food choices at home, watching the sugar content in her diet. She has gained 4-1/2 pounds since her last visit 4 months ago. Her thyroid in March 2016 was 1.51/normal. Patient's cholesterol was elevated arch 2016, total cholesterol 221, triglycerides 195, HDL 61, LDL 120. Patient does not have the energy to exercise as much as she would like.  Hyperlipidemia: Patient's cholesterol was elevated March 2016, total cholesterol 221, triglycerides 195, HDL 61, LDL 120. Patient is on Lipitor 40 mg daily, and states she does take fish oil as well. She has been attempting to exercise, but admittedly is not doing much. She has attempted to watch the sugar content in her diet, and eating leaner meats. She has a strong family history of heart disease and hypertension. She is not experiencing any side effects to statin medication.  Other fatigue: Patient has noticed an increase in fatigue over the last few months. She is fearful that she has sleep apnea because she snores and has daytime fatigue. She reports that her husband and her on opposite schedules, and he snores as well. She's been trying to exercise, but feels too tired to do so. She has been recently placed on Topamax and is prescribed trazodone through her psychological provider. Patient has had a history of low vitamin D, and has recently completed supplementation. Patient states she has good sleep hygiene, but has trouble going to sleep, despite trazodone use. She states she can fall asleep while watching TV, but denies falling asleep at the dinner table or when driving.  Constipation: Reviewed prior lab collections, TSH was completed with the last few  months with normal value. Patient was using miralax daily, but has not been using it recently. She reports she is having a bowel movement approximately 4-5 times a week now. She does have to strain at times to have a bowel movement. She denies any rectal bleeding or  Hemorrhoids. Past Medical History  Diagnosis Date  . PONV (postoperative nausea and vomiting)   . Hyperlipidemia     takes Atorvastatin daily  . Headache(784.0)   . 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 Hypertension     takes Amlodipine and Propranolol daily  . Anxiety     takes Ativan daily as needed  . Depression     takes Zoloft daily  . Insomnia     takes Trazodone at bedtime  . GERD (gastroesophageal reflux disease)     takes Omeprazole daily  . Arthritis    Allergies  Allergen Reactions  . Ace Inhibitors Swelling and Other (See Comments)    Angioedema    Social History   Social History  . Marital Status: Married    Spouse Name: N/A  . Number of Children: 0  . Years of Education: N/A   Occupational History  .     Social History Main Topics  . Smoking status: Never Smoker   . Smokeless tobacco: Never Used  . Alcohol Use: Yes     Comment: wine and beer on occasion  . Drug Use: No  . Sexual Activity: Yes    Birth Control/ Protection: Pill  Other Topics Concern  . Not on file   Social History Narrative   Family History  Problem Relation Age of Onset  . Heart disease Mother 69    cad  . Lupus Mother   . COPD Mother   . Arthritis Mother     rheumatoid  . AVM Mother   . Diabetes Mother   . Diabetes Father   . Cancer Father     thyroid  . Depression Sister   . Diabetes Sister   . Hyperlipidemia Sister   . Hypertension Sister   . Cancer Paternal Grandfather     colon   Past Surgical History  Procedure Laterality Date  . Myomectomy  2009  . Laparotomy    . Myomectomy  05/16/2011    Procedure: MYOMECTOMY;  Surgeon: Margarette Asal;   Location: [facility];  Service: Gynecology;  Laterality: N/A;  abdominal  . Radiology with anesthesia N/A 02/18/2013    Procedure: RADIOLOGY WITH ANESTHESIA;  Surgeon: Rob Hickman, MD;  Location: [facility];  Service: Radiology;  Laterality: N/A;  . Brain surgery    . Radiology with anesthesia N/A 01/26/2014    Procedure: RADIOLOGY WITH ANESTHESIA;  Surgeon: Rob Hickman, MD;  Location: [facility];  Service: Radiology;  Laterality: N/A;  . Dilation and curettage of uterus  2009  . Radiology with anesthesia N/A 07/25/2014    Procedure: RADIOLOGY WITH ANESTHESIA;  Surgeon: Rob Hickman, MD;  Location: [facility];  Service: Radiology;  Laterality: N/A;    Review of Systems ROS Negative, with the exception of above mentioned in HPI     Objective:   Physical Exam BP 118/81 mmHg  Pulse 62  Temp(Src) 98.7 F (37.1 C) (Oral)  Resp 18  Ht 5\' 6"  (1.676 m)  Wt 234 lb 6.4 oz (106.323 kg)  BMI 37.85 kg/m2  SpO2 99%  LMP 04/27/2015 Gen: Afebrile. No acute distress. Nontoxic in appearance, well-developed, well-nourished , Caucasian female, obese. HENT: AT. [place]. Bilateral TM visualized and normal in appearance.  MMM. Bilateral nares without erythema or swelling. Throat without erythema or exudates. No cough or hoarseness on exam Eyes:Pupils Equal Round Reactive to light, Extraocular movements intact, Conjunctiva without redness, discharge or icterus. Neck: Supple, no lymphadenopathy, no thyromegaly CV: RRR , no edema, +2/4 P posterior tibialis pulses Chest: CTAB, no wheeze or crackles Abd: Soft. Obese. NTND. BS present. No Masses palpated.  Skin: No rashes, purpura or petechiae.  Neuro/MSK: Normal gait. PERLA. EOMi. Alert. Oriented. Cranial nerves II through XII intact. Muscle strength 5/5 upper and lower extremity. DTRs equal bilaterally. Psych: Normal affect, dress and demeanor. Normal speech. Normal thought content and judgment..     Assessment & Plan:  1. Metabolic syndrome - Encouraged  healthy diet, low in sugars and saturated fats, >150 minutes of exercise a week - Hemoglobin A1c  2. Hyperlipidemia - Continue Lipitor 40 mg /d - Encouraged low saturated fat diet, exercise >150 minutes a week.  - Lipid panel  3. Other fatigue - Vitamins D  - B12 - Consider sleep study once labs are returned (if normal) and patient speak to her other doctor about side effects of topamax and trazadone as possible causes of increased fatigue. Topamax appeared to be started approximately time fatigue increased.   4. Need for prophylactic vaccination and inoculation against influenza - Flu Vaccine QUAD 36+ mos PF IM (Fluarix & Fluzone Quad PF) administered  5. Constipation:  - Recent TSH result  Reviewed and were normal.  -  Healthy fruits and veggies, lean meats. Exercise daily. Daily toilet routine is helpful. - Miralax 1 cap per 8 ounces of water, tapered up/down  to bowel movements as needed.   Patient will be called with results once they are available and further management/ prescriptions will be called in at that time if appropriate.

## 2015-05-11 NOTE — Patient Instructions (Signed)
We will call with the labs results once available.  If fatigue continues we can consider sleep study, although your medications may be the reasoning. You should talk to the the provider prescribing new medications about your increased fatigue.  Constipation: Healthy fruits and veggies, lean meats. Exercise. Miralax 1 cap per 8 ounces of water, taper this to your bowel movements.  Prediabetes: Exercise >150 minutes a week, watch diet closely.   Diabetes and Exercise Exercising regularly is important. It is not just about losing weight. It has many health benefits, such as:  Improving your overall fitness, flexibility, and endurance.  Increasing your bone density.  Helping with weight control.  Decreasing your body fat.  Increasing your muscle strength.  Reducing stress and tension.  Improving your overall health. People with diabetes who exercise gain additional benefits because exercise:  Reduces appetite.  Improves the body's use of blood sugar (glucose).  Helps lower or control blood glucose.  Decreases blood pressure.  Helps control blood lipids (such as cholesterol and triglycerides).  Improves the body's use of the hormone insulin by:  Increasing the body's insulin sensitivity.  Reducing the body's insulin needs.  Decreases the risk for heart disease because exercising:  Lowers cholesterol and triglycerides levels.  Increases the levels of good cholesterol (such as high-density lipoproteins [HDL]) in the body.  Lowers blood glucose levels. YOUR ACTIVITY PLAN  Choose an activity that you enjoy and set realistic goals. Your health care provider or diabetes educator can help you make an activity plan that works for you. Exercise regularly as directed by your health care provider. This includes:  Performing resistance training twice a week such as push-ups, sit-ups, lifting weights, or using resistance bands.  Performing 150 minutes of cardio exercises each week  such as walking, running, or playing sports.  Staying active and spending no more than 90 minutes at one time being inactive. Even short bursts of exercise are good for you. Three 10-minute sessions spread throughout the day are just as beneficial as a single 30-minute session. Some exercise ideas include:  Taking the dog for a walk.  Taking the stairs instead of the elevator.  Dancing to your favorite song.  Doing an exercise video.  Doing your favorite exercise with a friend. RECOMMENDATIONS FOR EXERCISING WITH TYPE 1 OR TYPE 2 DIABETES   Check your blood glucose before exercising. If blood glucose levels are greater than 240 mg/dL, check for urine ketones. Do not exercise if ketones are present.  Avoid injecting insulin into areas of the body that are going to be exercised. For example, avoid injecting insulin into:  The arms when playing tennis.  The legs when jogging.  Keep a record of:  Food intake before and after you exercise.  Expected peak times of insulin action.  Blood glucose levels before and after you exercise.  The type and amount of exercise you have done.  Review your records with your health care provider. Your health care provider will help you to develop guidelines for adjusting food intake and insulin amounts before and after exercising.  If you take insulin or oral hypoglycemic agents, watch for signs and symptoms of hypoglycemia. They include:  Dizziness.  Shaking.  Sweating.  Chills.  Confusion.  Drink plenty of water while you exercise to prevent dehydration or heat stroke. Body water is lost during exercise and must be replaced.  Talk to your health care provider before starting an exercise program to make sure it is safe for you. Remember,  almost any type of activity is better than none. Document Released: 11/09/2003 Document Revised: 01/03/2014 Document Reviewed: 01/26/2013 [facility] Patient Information 2015 [place], [place]. This  information is not intended to replace advice given to you by your health care provider. Make sure you discuss any questions you have with your health care provider.

## 2015-05-12 ENCOUNTER — Telehealth: Payer: Self-pay | Admitting: Family Medicine

## 2015-05-12 MED ORDER — VITAMIN B-12 1000 MCG PO TABS: ORAL_TABLET | ORAL | Status: DC

## 2015-05-12 NOTE — Telephone Encounter (Signed)
LMOM for pt to CB.  

## 2015-05-12 NOTE — Telephone Encounter (Signed)
Patient aware of results and new medication instructions.  Pt voiced understanding and had no questions at this time.

## 2015-05-12 NOTE — Telephone Encounter (Signed)
Please call pt:  -  Cholesterol levels are mildly improved, but still could be lower. Total 196, HDL 52, LDL 107 (high), and Triglycerides 184 (high). She should continue her low saturated fat diet, high in fiber, low sugar/carbs. Continue fish oil and statin.  - A1c is much better! 5.7 to 5.4, keep up the good work. - b12 was low. We will need to start supplementing her B12. She should take 2000 mcg a day for two weeks, then 1000 mcg daily. This has been called into her pharmacy.  F/U 4-6 months for HTN, sooner if needed Thanks.

## 2015-05-26 ENCOUNTER — Telehealth: Payer: Self-pay | Admitting: Family Medicine

## 2015-05-26 DIAGNOSIS — I10 Essential (primary) hypertension: Secondary | ICD-10-CM

## 2015-05-26 MED ORDER — PROPRANOLOL HCL ER 80 MG PO CP24: 80.0000 mg | ORAL_CAPSULE | Freq: Every day | ORAL | Status: DC

## 2015-05-26 NOTE — Telephone Encounter (Signed)
Rx sent in to pharmacy. 

## 2015-05-26 NOTE — Telephone Encounter (Signed)
[name] is requesting a refill on her protanolol. Pharmacy said she needed a visit for refill but Neelam was here 1 week ago.

## 2015-06-16 ENCOUNTER — Ambulatory Visit (HOSPITAL_COMMUNITY): Payer: Commercial Managed Care - PPO | Admitting: Licensed Clinical Social Worker

## 2015-06-21 ENCOUNTER — Other Ambulatory Visit: Payer: Self-pay | Admitting: *Deleted

## 2015-06-21 DIAGNOSIS — Z6836 Body mass index (BMI) 36.0-36.9, adult: Secondary | ICD-10-CM

## 2015-06-21 DIAGNOSIS — I10 Essential (primary) hypertension: Secondary | ICD-10-CM

## 2015-06-21 DIAGNOSIS — E039 Hypothyroidism, unspecified: Secondary | ICD-10-CM

## 2015-06-21 MED ORDER — AMLODIPINE BESYLATE 10 MG PO TABS: 10.0000 mg | ORAL_TABLET | Freq: Every day | ORAL | Status: DC

## 2015-06-21 MED ORDER — ATORVASTATIN CALCIUM 40 MG PO TABS: 40.0000 mg | ORAL_TABLET | Freq: Every day | ORAL | Status: DC

## 2015-06-21 NOTE — Telephone Encounter (Signed)
Refills sent for amlodipine per refill protocol

## 2015-06-21 NOTE — Telephone Encounter (Signed)
Refills sent for atorvastatin per refill protocol

## 2015-06-22 ENCOUNTER — Ambulatory Visit (INDEPENDENT_AMBULATORY_CARE_PROVIDER_SITE_OTHER): Payer: Commercial Managed Care - PPO | Admitting: Licensed Clinical Social Worker

## 2015-06-22 ENCOUNTER — Encounter (HOSPITAL_COMMUNITY): Payer: Self-pay | Admitting: Licensed Clinical Social Worker

## 2015-06-22 DIAGNOSIS — F411 Generalized anxiety disorder: Secondary | ICD-10-CM

## 2015-06-22 DIAGNOSIS — F331 Major depressive disorder, recurrent, moderate: Secondary | ICD-10-CM | POA: Diagnosis not present

## 2015-06-22 NOTE — Progress Notes (Signed)
Patient:   [name]   DOB:   1971-04-13  MR Number:  [id number]  Location:  [facility] [address] [place] [address] Dept: [phone]           Date of Service:   06/22/15  Start Time:   1:00pm End Time:   2:00pm  Provider/Observer:  [place] [employer]       Billing Code/Service: [phone]  Comprehensive Clinical Assessment  Information for assessment provided by: patient   Chief Complaint:    Depression and anxiety     Presenting Problem/Symptoms:    "I feel like I can't get over the depression hump and the worrying." "It's been a while since I've been in counseling.  I thought it was time to get back in it." Feeling less productive than usual.   Previous MH/SA diagnoses: Depression and anxiety      Mental Health Symptoms:    Depression:  PHQ-9=11 (moderate)   Current symptoms include depressed mood, anhedonia, insomnia, fatigue, feelings of worthlessness/guilt, increased appetite,.   Symptoms have worsened since she had brain aneurism in June 2014     Past episodes of depression: yes  Anxiety:  GAD-7= 8 (moderate)  Tends to be perfectionistic   Anxiety is more noticeable at time of menstrual cycle  Panic Attacks: about twice a month     Self-Harm Potential: Thoughts of Self-Harm: none Is there a family history of suicide? no Previous attempts? no Preoccupation with death? no History of acts of self-harm? no  Dangerousness to Others Potential: Denies Family history of violence? no Previous attempts? no    Mania/hypomania: denies        Psychosis:  denies    Abuse/Trauma History:  denies  PTSD symptoms: na      Obsessions: denies    Compulsions: denies               Mental Status  Interactions:    Active   Attention:   Good  Memory:   A little concerned about short term memory  since brain aneurism  Speech:   Normal   Flow of Thought:  Normal  Thought Content:  WNL  Orientation:   person, place, time/date and situation  Judgment:   Good  Affect/Mood:   Appropriate  Insight:   Good        Medical History:    Past Medical History  Diagnosis Date  . PONV (postoperative nausea and vomiting)   . Hyperlipidemia     takes Atorvastatin daily  . Headache(784.0)   . 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 Hypertension     takes Amlodipine and Propranolol daily  . Anxiety     takes Ativan daily as needed  . Depression     takes Zoloft daily  . Insomnia     takes Trazodone at bedtime  . GERD (gastroesophageal reflux disease)     takes Omeprazole daily  . Arthritis      Current medications:         Outpatient Encounter Prescriptions as of 06/22/2015  Medication Sig  . albuterol (PROVENTIL HFA;VENTOLIN HFA) 108 (90 BASE) MCG/ACT inhaler Inhale 2 puffs into the lungs every 4 (four) hours as needed for wheezing or shortness of breath.  Marland Kitchen amLODipine (NORVASC) 10 MG tablet Take 1 tablet (10 mg total) by mouth daily.  Marland Kitchen aspirin EC  325 MG tablet Take 325 mg by mouth daily.  Marland Kitchen atorvastatin (LIPITOR) 40 MG tablet Take 1 tablet (40 mg total) by mouth daily.  . DULoxetine (CYMBALTA) 30 MG capsule Take 90 mg by mouth every morning.  Marland Kitchen ibuprofen (ADVIL,MOTRIN) 200 MG tablet Take 400-800 mg by mouth every 6 (six) hours as needed (for pain).  . LORazepam (ATIVAN) 0.5 MG tablet Take 1 tablet (0.5 mg total) by mouth 2 (two) times daily as needed for anxiety.  . Multiple Vitamin (MULTIVITAMIN WITH MINERALS) TABS tablet Take 1 tablet by mouth daily.  . norgestimate-ethinyl estradiol (ORTHO-CYCLEN,SPRINTEC,PREVIFEM) 0.25-35 MG-MCG tablet Take 1 tablet by mouth daily.  . Omega-3 Fatty Acids (FISH OIL PO) Take 1 capsule by mouth daily.  Marland Kitchen omeprazole (PRILOSEC OTC) 20 MG tablet Take 20 mg by mouth daily as needed (for  acid reflux).   . propranolol ER (INDERAL LA) 80 MG 24 hr capsule Take 1 capsule (80 mg total) by mouth at bedtime.  . topiramate (TOPAMAX) 25 MG tablet Take 100 mg by mouth daily. Patient takes 100 mg daily  . traZODone (DESYREL) 50 MG tablet Take 50 mg by mouth at bedtime.   . vitamin B-12 (CYANOCOBALAMIN) 1000 MCG tablet 2000 mcg QD 14 days, then 1000 mcg daily   No facility-administered encounter medications on file as of 06/22/2015.              Mental Health/Substance Use Treatment History:     Sees Metta Clines NP for medication management   First took psych meds in her early 3s Last saw a therapist 3-4 years ago   Family Med/Psych History:  Family History  Problem Relation Age of Onset  . Heart disease Mother 80    cad  . Lupus Mother   . COPD Mother   . Arthritis Mother     rheumatoid  . AVM Mother   . Diabetes Mother   . Diabetes Father   . Cancer Father     thyroid  . Depression Sister   . Diabetes Sister   . Hyperlipidemia Sister   . Hypertension Sister   . Cancer Paternal Grandfather     colon       Substance Use History:   Alcohol- currently 1 or less drinks per day, drinks about once every other weekend       Marital Status:  Married for 5 years to [employer] with: husband   Family Relationships:  Dad lives in [place], [place].  He remarried in 15-Dec-2011.  In poor health.  Good relationship though  Mom died in December 15, 2010.  She had a lot of health problems as well as bipolar disorder.  Relationship was "ok"    Older sister, Judeen Hammans (79) "We're close but she has her own issues she doesn't seem to want to work [employer] close by  Talk about once a week  Grew up [place].  Same place she lives now.   Mom not diagnosed with bipolar until patient was a teenager.  Remembers mom having mood swings.   Parents argued a lot.   "I took on the role as caretaker of the family."  Blamed herself for problems in the family   Other  Social Supports: Has "a lot of friends"     Current Employment: Optometrist   Enjoys the work for the most part  Education:   Gaffer in [employer]         Would eventually like to take certification  course   Legal History:  none  Military Involvement: none  Religion/Spirituality:  Christian   Doesn't attend a place of worship regularly  Hobbies:  Yoga, reading fiction, watching The Voice or crime drama shows, spending time with friends  Strengths/Protective Factors: good listener, compassionate, easy-going        Impression/DX:  Major Depressive Disorder, recurrent                                                 Generalized Anxiety Disorder  Disposition/Plan: Recommending individual therapy with a focus on decreasing symptoms of depression and anxiety.  Patient says she would like to increase her motivation to complete tasks.  Interventions will include helping her to identify and change negative or irrational thinking patterns and teaching her skills for mindfulness, behavioral activation, and emotion regulation.

## 2015-06-27 ENCOUNTER — Other Ambulatory Visit (HOSPITAL_COMMUNITY): Payer: Self-pay | Admitting: Interventional Radiology

## 2015-06-27 DIAGNOSIS — I729 Aneurysm of unspecified site: Secondary | ICD-10-CM

## 2015-07-04 ENCOUNTER — Ambulatory Visit (HOSPITAL_COMMUNITY)
Admission: RE | Admit: 2015-07-04 | Discharge: 2015-07-04 | Disposition: A | Discharge status: Home / Self Care | Payer: Commercial Managed Care - PPO | Source: Ambulatory Visit | Attending: Interventional Radiology | Admitting: Interventional Radiology

## 2015-07-04 DIAGNOSIS — I671 Cerebral aneurysm, nonruptured: Secondary | ICD-10-CM

## 2015-07-04 DIAGNOSIS — Z9889 Other specified postprocedural states: Secondary | ICD-10-CM | POA: Diagnosis not present

## 2015-07-04 DIAGNOSIS — I729 Aneurysm of unspecified site: Secondary | ICD-10-CM

## 2015-07-04 LAB — POCT I-STAT CREATININE: Creatinine, Ser: 1 mg/dL (ref 0.44–1.00)

## 2015-07-04 MED ORDER — GADOBENATE DIMEGLUMINE 529 MG/ML IV SOLN
20.0000 mL | Freq: Once | INTRAVENOUS | Status: AC | PRN
  Administered 2015-07-04: 20 mL via INTRAVENOUS

## 2015-07-05 ENCOUNTER — Telehealth (HOSPITAL_COMMUNITY): Payer: Self-pay | Admitting: Interventional Radiology

## 2015-07-05 NOTE — Telephone Encounter (Signed)
Called pt, told her that her next follow-up would be due in 6 months from now and she would need a cerebral angiogram. The pt understood and was in agreement with this plan of care. JM

## 2015-07-06 ENCOUNTER — Ambulatory Visit (HOSPITAL_COMMUNITY): Payer: Self-pay | Admitting: Licensed Clinical Social Worker

## 2015-07-07 ENCOUNTER — Ambulatory Visit (INDEPENDENT_AMBULATORY_CARE_PROVIDER_SITE_OTHER): Payer: Commercial Managed Care - PPO | Admitting: Licensed Clinical Social Worker

## 2015-07-07 DIAGNOSIS — F331 Major depressive disorder, recurrent, moderate: Secondary | ICD-10-CM

## 2015-07-07 DIAGNOSIS — F411 Generalized anxiety disorder: Secondary | ICD-10-CM

## 2015-07-07 NOTE — Progress Notes (Signed)
   THERAPIST PROGRESS NOTE  Session Time: 1:05pm-2:05pm  Participation Level: Active  Behavioral Response: Well GroomedAlertDepressed  Type of Therapy: Individual Therapy  Treatment Goals addressed: depression/anhedonia  Interventions: Mindfulness, psycho-ed about depression    Suicidal/Homicidal: Denied both  Therapist Interventions:  Reviewed some concepts of mindfulness.  Explained two modes of thinking: the doing mode which is focused on problem solving and the being mode which involves acceptance of the situation.  Explained how switching to being mode can be helpful in situations where there is no way to fix the problem.   Reviewed potential causes of depression including childhood experiences, stressful situations, and biology.  Discussed how depression is not so much about curing it, but accepting that it is sometimes going to be there and you have to learn to cope with it as best you can.     Summary: Described how she often feels like her work environment is dysfunctional.  Noted that it is difficult for her to work under a boss who does not engage or offer feedback.  At times questions whether she should stay in the position.   Expressed a belief that expanding on her knowledge of mindfulness will be helpful.   Indicated that she believes her depression is caused by a combination of factors.  Mental illness seems to run in her family.  In childhood she typically did not get validation of her feelings.    Did not report any change in her symptoms.  Continues to feel like she is not accomplishing much.     Plan:  Will schedule next session within the next two weeks.  Plan will be to expand on mindfulness   Diagnosis: Major Depressive Disorder, recurrent, moderate                         Generalized Anxiety Disorder      Garnette Scheuermann, LCSW 07/07/2015

## 2015-07-26 ENCOUNTER — Ambulatory Visit (INDEPENDENT_AMBULATORY_CARE_PROVIDER_SITE_OTHER): Payer: Commercial Managed Care - PPO | Admitting: Licensed Clinical Social Worker

## 2015-07-26 DIAGNOSIS — F331 Major depressive disorder, recurrent, moderate: Secondary | ICD-10-CM

## 2015-07-26 DIAGNOSIS — F411 Generalized anxiety disorder: Secondary | ICD-10-CM

## 2015-07-26 NOTE — Progress Notes (Signed)
   THERAPIST PROGRESS NOTE  Session Time: 1:00pm-2:00pm  Participation Level: Active  Behavioral Response: Well GroomedAlert Euthymic  Type of Therapy: Individual Therapy  Treatment Goals addressed: depression/anhedonia  Interventions: Mindfulness    Suicidal/Homicidal: Denied both  Therapist Interventions:  Reviewed the concept of mindfulness.  Emphasized how learning to focus on the present can help you to feel more in control of your emotions.  Explained how it can be useful to practice at times when you catch yourself having unhelpful thoughts.  Described how you can choose to do tasks in a mindful way.  Guided patient through practicing mindfulness in different ways including focusing on an object and mindful breathing.  Encouraged patient to practice the skills regularly in addition to times of distress.    Summary:  Receptive to concepts presented today.  She said, "It's something I will go home and practice.  I look forward to being able to utilize this when I do have unhelpful thoughts and distressing feelings."  Reported some improvement with her mood.  Noted that she is no longer taking Cymbalta and for the past week and a half she has been taking Trintellix.  Also described how she has adjusted her expectations for her relationship with her boss and how that seems to have helped.   Reported "I'm feeling more satisfied with tasks I'm accomplishing, but I'm not quite there yet."        Plan:  Will schedule next session in 2-3 weeks.  Will check on implementation of mindfulness skills.  Diagnosis: Major Depressive Disorder, recurrent, moderate                         Generalized Anxiety Disorder      Armandina Stammer 07/26/2015

## 2015-08-10 ENCOUNTER — Ambulatory Visit (HOSPITAL_COMMUNITY): Payer: Commercial Managed Care - PPO | Admitting: Licensed Clinical Social Worker

## 2015-08-18 ENCOUNTER — Ambulatory Visit (INDEPENDENT_AMBULATORY_CARE_PROVIDER_SITE_OTHER): Payer: Commercial Managed Care - PPO | Admitting: Licensed Clinical Social Worker

## 2015-08-18 DIAGNOSIS — F411 Generalized anxiety disorder: Secondary | ICD-10-CM

## 2015-08-18 DIAGNOSIS — F330 Major depressive disorder, recurrent, mild: Secondary | ICD-10-CM

## 2015-08-18 NOTE — Progress Notes (Signed)
   THERAPIST PROGRESS NOTE  Session Time: 1:05pm-1:55pm  Participation Level: Active  Behavioral Response: Well GroomedAlert Slightly depressed  Type of Therapy: Individual Therapy  Treatment Goals addressed: depression/anhedonia  Interventions: Mindfulness  Suicidal/Homicidal: Denied both  Therapist Interventions:  Discussed how patient has been working on noticing when she is overanalyzing and making a shift in her thinking to "being mode."  Described how it can be very easy to judge yourself for experiencing depressive symptoms.  Suggested that an alternative way to interpret those symptoms is to acknowledge your current experience as your body telling you that you need to slow down or take care of yourself in some way.  Discussed how patient has a tendency to compare herself to others.  Pointed out that it is irrational to compare yourself because everyone's experience is unique and is it rare that we truly know what people are going through.  Suggested that patient set aside a few minutes each day to write down 3 things she is proud of accomplishing.     Had patient complete a PHQ-9 to assess for severity of depressive symptoms.    Summary:  Depression screen [facility] 2/9 08/18/2015 06/22/2015  Decreased Interest 1 1  Down, Depressed, Hopeless 1 1  PHQ - 2 Score 2 2  Altered sleeping 1 2  Tired, decreased energy 2 3  Change in appetite 1 2  Feeling bad or failure about yourself  2 2  Trouble concentrating 0 0  Moving slowly or fidgety/restless 0 0  Suicidal thoughts 0 0  PHQ-9 Score 8 11   Reported that her dad is currently in the hospital with pneumonia.  Concerned that he may not live much longer.   Indicated she has been practicing mindfulness more often in the past few weeks.   Reported "I'm still working on feeling more satisfied with tasks I'm accomplishing."  Indicated she intends to do the suggested homework.  Acknowledged that she generally doesn't give herself credit  for her accomplishments and when she does she quickly negates it.          Plan:  Planning to schedule next session in 2-3 weeks.    Diagnosis: Major Depressive Disorder, recurrent, mild                         Generalized Anxiety Disorder      Armandina Stammer 08/18/2015

## 2015-09-11 ENCOUNTER — Ambulatory Visit (HOSPITAL_COMMUNITY): Payer: Commercial Managed Care - PPO | Admitting: Licensed Clinical Social Worker

## 2015-09-26 ENCOUNTER — Encounter: Payer: Self-pay | Admitting: Family Medicine

## 2015-09-26 ENCOUNTER — Ambulatory Visit (INDEPENDENT_AMBULATORY_CARE_PROVIDER_SITE_OTHER): Payer: Commercial Managed Care - PPO | Admitting: Family Medicine

## 2015-09-26 VITALS — BP 120/85 | HR 73 | Temp 98.2°F | Resp 20 | Wt 228.0 lb

## 2015-09-26 DIAGNOSIS — F418 Other specified anxiety disorders: Secondary | ICD-10-CM | POA: Insufficient documentation

## 2015-09-26 DIAGNOSIS — Z7189 Other specified counseling: Secondary | ICD-10-CM

## 2015-09-26 DIAGNOSIS — G4700 Insomnia, unspecified: Secondary | ICD-10-CM

## 2015-09-26 MED ORDER — LORAZEPAM 0.5 MG PO TABS
0.5000 mg | ORAL_TABLET | Freq: Two times a day (BID) | ORAL | Status: DC | PRN
Start: 2015-09-26 — End: 2015-10-20

## 2015-09-26 MED ORDER — TRAZODONE HCL 150 MG PO TABS: 150.0000 mg | ORAL_TABLET | Freq: Every day | ORAL | Status: DC

## 2015-09-26 NOTE — Patient Instructions (Signed)
We will call you psychiatrist to see if they can work you in. Their office will call you, if you do not hear from them today then please call them first thing in the morning.  If you have increased anxiety/depression or feel you need to be seen immediately please be seen at the [facility] long ED. Increase your trazodone to 150 mg at night, the increased dose has been called in for you. You can take 3 of the 50 mg you currently have until completed.  I have also increased your ativan to every 8 hours.

## 2015-09-26 NOTE — Progress Notes (Signed)
Subjective:    Patient ID: [name], female    DOB: 09-Oct-1970, 45 y.o.   MRN: [id number]  HPI     Depression with Anxiety: pt presents to PCP for acute OV with increase in her anxiety. Patient states she is uncertain if her anxiety is increased because of her work environment or something else, but she feel like she " is not in a good place." her work is more stressful because it is a high pressure situation and they are letting go of ppl without notice. She states her boss does not communicate well. Her father is also in the hospital with lung disease and he is not dong well. She states the anxiety increase was prior to her father's admission. She is established with a counselor, but could not get in to see her until next week . She is also established with psychiatry, however they are no longer in her plan since the change of insurance and it cost her $110 each visit. She is currently prescribed ativan 0.5 mg  BID PRN and trintillix 20 mg qhs. She is takg the ativan BID, but feels it is wearing off too soon, but helpful when taken.  Denies SI/HI.   Insomnia: She has noticed an increase in her insomnia  with the acute anxiety. She is taking 100 mg trazodone at night, but waking up in the middle of the night.   Past Medical History  Diagnosis Date  . PONV (postoperative nausea and vomiting)   . Hyperlipidemia     takes Atorvastatin daily  . Headache(784.0)   . SAH (subarachnoid hemorrhage) (HCC)     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  . Hypertension     takes Amlodipine and Propranolol daily  . Anxiety     takes Ativan daily as needed  . Depression     psychiatry  . Insomnia     takes Trazodone at bedtime  . GERD (gastroesophageal reflux disease)   . Arthritis    Allergies  Allergen Reactions  . Ace Inhibitors Swelling and Other (See Comments)    Angioedema    Social History  Substance Use Topics  . Smoking status: Never Smoker   . Smokeless tobacco:  Never Used  . Alcohol Use: Yes     Comment: wine and beer on occasion    Review of Systems Negative, with the exception of above mentioned in HPI     Objective:   Physical Exam BP 120/85 mmHg  Pulse 73  Temp(Src) 98.2 F (36.8 C) (Oral)  Resp 20  Wt 228 lb (103.42 kg)  SpO2 97%  LMP 09/04/2015 Gen: Afebrile. No acute distress. Anxious.  HENT: AT. [place].  MMM.  Eyes:Pupils Equal Round Reactive to light, Extraocular movements intact,  Conjunctiva without redness, discharge or icterus. Neuro: Normal gait. PERLA. EOMi. Alert. Oriented x3.  Psych: Anxious. psychomotor agitation.  No mania. Normal speech. Normal thought content and judgment. No SI/HI.      Assessment & Plan:  [name] is a 45 y.o. female presents with acute anxiety, on current depression/anxiety medications.  Depression with anxiety, insomnia - Pt with acute increase of anxiety. She is established with psych ([facility]) and currently is prescibed ativan, trintellix and and trazodone.Attempted to discuss cause and pt is uncertain of acute increase but does state she is "not in a good place". No SI or HI today.  - Increase ativan to TID scheduled dosing since she is getting benefit with use, but feels  it is wearing off quickly.  - increase trazodone to 150 mg 1 hour prior to bed, QHS.  - Patient will eventually need new referral to psych to attempt to find someone within her plan, I do not feel that should be today. Patient agrees. Will attempt to contact psych office to get her a work in appt. Discussed emergent care, SI/HI and [facility] if she feels emergent need.   Encounter for medication counseling - Pt abruptly stopped Topamax. She states it was prescribed for her migraines. Encouraged not to abruptly stop medication and to taper down by 50 mg weekly (at least). Pt in understanding.    > 25 minutes spent with patient, >50% of time spent face to face counseling patient and coordinating care.  F/U PRN

## 2015-09-28 ENCOUNTER — Telehealth: Payer: Self-pay | Admitting: *Deleted

## 2015-09-28 DIAGNOSIS — F418 Other specified anxiety disorders: Secondary | ICD-10-CM

## 2015-09-28 NOTE — Telephone Encounter (Signed)
Patient called and left message stating she had dicussed with you at her appt that her current psychology office is now out of network she would like a recommendation for another psych office. Please advise.

## 2015-09-28 NOTE — Telephone Encounter (Signed)
I have placed the psychiatry referral for her today. Which generated in the system, who we are able to see you is within her network.

## 2015-10-10 ENCOUNTER — Ambulatory Visit (HOSPITAL_COMMUNITY): Payer: Commercial Managed Care - PPO | Admitting: Licensed Clinical Social Worker

## 2015-10-12 ENCOUNTER — Ambulatory Visit (INDEPENDENT_AMBULATORY_CARE_PROVIDER_SITE_OTHER): Payer: Commercial Managed Care - PPO | Admitting: Licensed Clinical Social Worker

## 2015-10-12 DIAGNOSIS — F331 Major depressive disorder, recurrent, moderate: Secondary | ICD-10-CM | POA: Diagnosis not present

## 2015-10-12 DIAGNOSIS — F411 Generalized anxiety disorder: Secondary | ICD-10-CM

## 2015-10-13 NOTE — Progress Notes (Signed)
   THERAPIST PROGRESS NOTE  Session Time: 1:05pm-2:00pm  Participation Level: Active  Behavioral Response: Casual Alert Depressed and tearful  Type of Therapy: Individual Therapy  Treatment Goals addressed: depression/anhedonia  Interventions: Assessment and solution focused  Suicidal/Homicidal: Denied both  Therapist Interventions:    Gathered information about significant events and changes in mood and functioning since last seen for therapy in mid-December.  Assessed how she has been coping with an increase in her symptoms.   Reminded her of how she can be incorporating mindfulness practice into her daily life, setting aside a time each day for it and using it as needed when she realizes she is stressed.  Suggested checking in with herself throughout the day, stopping to ask herself what she needs in that moment.  Recommended she get back in the habit of writing down 3 things she is proud of accomplishing each day so she can give herself credit she deserves.  Encouraged her not to isolate from others.           Summary:  Reported "I'm having a hard time keeping up with work."  She is an Optometrist.  Her workload has increased and she has had to work extra hours.  Doesn't expect this to change.  Worries about being fired despite the fact that no one has given her negative feedback about her performance.  Described herself as feeling "stuck."  Debating about looking for a new job, but knows she doesn't have the energy and concentration to do so at this time.      Reported that her dad has been in and out of the hospital in the past several weeks.  Continues to worry that he may not live much longer.  Indicated symptoms of depression and anxiety have increased significantly.  Said "I don't eat like I should."  Acknowledged she has lost some weight.  Has been relying on meds to help her sleep.  Lately she has been falling asleep around 8 or 9pm and waking up at 3 or 4am. Came across as  frustrated, as though she expected the therapist to have a definite answer as to how to get herself out of her depression.  Agreed to implement the suggestions.           Plan:   Scheduled to return in 2 weeks.   Diagnosis: Major Depressive Disorder, recurrent, moderate                         Generalized Anxiety Disorder      Garnette Scheuermann, LCSW 10/12/2015

## 2015-10-20 ENCOUNTER — Ambulatory Visit (INDEPENDENT_AMBULATORY_CARE_PROVIDER_SITE_OTHER): Payer: Commercial Managed Care - PPO | Admitting: Family Medicine

## 2015-10-20 ENCOUNTER — Encounter: Payer: Self-pay | Admitting: Family Medicine

## 2015-10-20 VITALS — BP 114/76 | HR 78 | Temp 98.5°F | Resp 20 | Wt 221.0 lb

## 2015-10-20 DIAGNOSIS — I10 Essential (primary) hypertension: Secondary | ICD-10-CM

## 2015-10-20 NOTE — Progress Notes (Signed)
Patient ID: [name], female   DOB: Jan 14, 1971, 45 y.o.   MRN: [id number]    [name] , Mar 09, 1971, 45 y.o., female MRN: [id number]  CC: High blood pressure Subjective: Hypertension: Patient is seen in the acute office visit setting today for readings of elevated blood pressures at home and her psychiatrist's office over the last 2 visits. Patient reports the blood pressure readings are in the high 140s/150s on the top number and below 90 on the bottom number. She has been evaluated with her psychiatrist over the last few weeks for increased anxiety/depression. Have been changing her medications, and there is medication updates were corrected in the electronic medical record today. She reports she is checking her home blood pressures with the wrist cuff. She denies any chest pain, shortness of breath, dizziness, syncope or lower extremity edema.   Allergies  Allergen Reactions  . Ace Inhibitors Swelling and Other (See Comments)    Angioedema    Social History  Substance Use Topics  . Smoking status: Never Smoker   . Smokeless tobacco: Never Used  . Alcohol Use: Yes     Comment: wine and beer on occasion   Past Medical History  Diagnosis Date  . PONV (postoperative nausea and vomiting)   . Hyperlipidemia     takes Atorvastatin daily  . Headache(784.0)   . SAH (subarachnoid hemorrhage) (HCC)     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  . Hypertension     takes Amlodipine and Propranolol daily  . Anxiety     takes Ativan daily as needed  . Depression     psychiatry  . Insomnia     takes Trazodone at bedtime  . GERD (gastroesophageal reflux disease)   . Arthritis    Past Surgical History  Procedure Laterality Date  . Myomectomy  2009  . Laparotomy    . Myomectomy  05/16/2011    Procedure: MYOMECTOMY;  Surgeon: Margarette Asal;  Location: [facility];  Service: Gynecology;  Laterality: N/A;  abdominal  . Radiology with anesthesia N/A 02/18/2013   Procedure: RADIOLOGY WITH ANESTHESIA;  Surgeon: Rob Hickman, MD;  Location: [facility];  Service: Radiology;  Laterality: N/A;  . Brain surgery    . Radiology with anesthesia N/A 01/26/2014    Procedure: RADIOLOGY WITH ANESTHESIA;  Surgeon: Rob Hickman, MD;  Location: [facility];  Service: Radiology;  Laterality: N/A;  . Dilation and curettage of uterus  2009  . Radiology with anesthesia N/A 07/25/2014    Procedure: RADIOLOGY WITH ANESTHESIA;  Surgeon: Rob Hickman, MD;  Location: [facility];  Service: Radiology;  Laterality: N/A;   Family History  Problem Relation Age of Onset  . Heart disease Mother 49    cad  . Lupus Mother   . COPD Mother   . Arthritis Mother     rheumatoid  . AVM Mother   . Diabetes Mother   . Bipolar disorder Mother   . Drug abuse Mother   . Diabetes Father   . Cancer Father     thyroid  . Depression Sister   . Diabetes Sister   . Hyperlipidemia Sister   . Hypertension Sister   . Cancer Paternal Grandfather     colon  . Alcohol abuse Paternal Uncle   . Drug abuse Maternal Grandfather   . Alcohol abuse Maternal Grandfather      Medication List       This list is accurate as of: 10/20/15 11:43 AM.  Always use your most recent med list.               albuterol 108 (90 Base) MCG/ACT inhaler  Commonly known as:  PROVENTIL HFA;VENTOLIN HFA  Inhale 2 puffs into the lungs every 4 (four) hours as needed for wheezing or shortness of breath.     amLODipine 10 MG tablet  Commonly known as:  NORVASC  Take 1 tablet (10 mg total) by mouth daily.     aspirin EC 325 MG tablet  Take 325 mg by mouth daily.     atorvastatin 40 MG tablet  Commonly known as:  LIPITOR  Take 1 tablet (40 mg total) by mouth daily.     clonazePAM 0.5 MG tablet  Commonly known as:  KLONOPIN  Take 0.5 mg by mouth 2 (two) times daily as needed for anxiety.     FISH OIL PO  Take 1 capsule by mouth daily.     ibuprofen 200 MG tablet  Commonly known as:  ADVIL,MOTRIN    Take 400-800 mg by mouth every 6 (six) hours as needed (for pain).     lithium carbonate 300 MG capsule  Take 300 mg by mouth 2 (two) times daily with a meal.     LORazepam 0.5 MG tablet  Commonly known as:  ATIVAN  Take 1 tablet (0.5 mg total) by mouth 2 (two) times daily as needed for anxiety.     multivitamin with minerals Tabs tablet  Take 1 tablet by mouth daily.     norgestimate-ethinyl estradiol 0.25-35 MG-MCG tablet  Commonly known as:  ORTHO-CYCLEN,SPRINTEC,PREVIFEM  Take 1 tablet by mouth daily.     propranolol ER 80 MG 24 hr capsule  Commonly known as:  INDERAL LA  Take 1 capsule (80 mg total) by mouth at bedtime.     SAPHRIS 10 MG Subl  Generic drug:  Asenapine Maleate  Take 1 tablet by mouth every evening.     traZODone 150 MG tablet  Commonly known as:  DESYREL  Take 1 tablet (150 mg total) by mouth at bedtime.     TRINTELLIX 20 MG Tabs  Generic drug:  Vortioxetine HBr  Take 20 mg by mouth at bedtime. Reported on 10/20/2015     vitamin B-12 1000 MCG tablet  Commonly known as:  CYANOCOBALAMIN  2000 mcg QD 14 days, then 1000 mcg daily       ROS: Negative, with the exception of above mentioned in HPI  Objective:  BP 114/76 mmHg  Pulse 78  Temp(Src) 98.5 F (36.9 C)  Resp 20  Wt 221 lb (100.245 kg)  SpO2 99%  LMP 10/05/2015 (Approximate) Body mass index is 35.69 kg/(m^2). Gen: Afebrile. No acute distress. On toxic in appearance, well-developed, well-nourished, Caucasian female. Eyes:Pupils Equal Round Reactive to light, Extraocular movements intact,  Conjunctiva without redness, discharge or icterus. CV: RRR, no edema Chest: CTAB, no wheeze or crackles. Good air movement, normal resp effort.  Abd: Soft. NTND. BS present* Neuro: Normal gait. PERLA. EOMi. Alert. Oriented x3   Assessment/Plan: [name] is a 45 y.o. female present for acute OV for  1. Essential hypertension - Patient reassured blood pressure is in normal range during all  of her office visits here, including today. SHe is to continue amlodipine 10 mg daily. - Elevations she seen at home, is likely secondary to wrist cuff. Encouraged her to monitor home with a arm cuff, large cuff. If her readings are consistently above 140/90. We would need to  see her sooner to discuss her blood pressures. Her blood pressures that are mildly elevated and her psychiatry office is likely secondary to her psychiatric issues/anxiety. She is to continue with a low-salt diet, and attempting to exercise greater than 150 minutes a week.  Jung Ingerson, DO  [place]     .

## 2015-10-20 NOTE — Patient Instructions (Signed)
Hypertension Hypertension, commonly called high blood pressure, is when the force of blood pumping through your arteries is too strong. Your arteries are the blood vessels that carry blood from your heart throughout your body. A blood pressure reading consists of a higher number over a lower number, such as 110/72. The higher number (systolic) is the pressure inside your arteries when your heart pumps. The lower number (diastolic) is the pressure inside your arteries when your heart relaxes. Ideally you want your blood pressure below 120/80. Hypertension forces your heart to work harder to pump blood. Your arteries may become narrow or stiff. Having untreated or uncontrolled hypertension can cause heart attack, stroke, kidney disease, and other problems. RISK FACTORS Some risk factors for high blood pressure are controllable. Others are not.  Risk factors you cannot control include:   Race. You may be at higher risk if you are African American.  Age. Risk increases with age.  Gender. Men are at higher risk than women before age 45 years. After age 65, women are at higher risk than men. Risk factors you can control include:  Not getting enough exercise or physical activity.  Being overweight.  Getting too much fat, sugar, calories, or salt in your diet.  Drinking too much alcohol. SIGNS AND SYMPTOMS Hypertension does not usually cause signs or symptoms. Extremely high blood pressure (hypertensive crisis) may cause headache, anxiety, shortness of breath, and nosebleed. DIAGNOSIS To check if you have hypertension, your health care provider will measure your blood pressure while you are seated, with your arm held at the level of your heart. It should be measured at least twice using the same arm. Certain conditions can cause a difference in blood pressure between your right and left arms. A blood pressure reading that is higher than normal on one occasion does not mean that you need treatment. If  it is not clear whether you have high blood pressure, you may be asked to return on a different day to have your blood pressure checked again. Or, you may be asked to monitor your blood pressure at home for 1 or more weeks. TREATMENT Treating high blood pressure includes making lifestyle changes and possibly taking medicine. Living a healthy lifestyle can help lower high blood pressure. You may need to change some of your habits. Lifestyle changes may include:  Following the DASH diet. This diet is high in fruits, vegetables, and whole grains. It is low in salt, red meat, and added sugars.  Keep your sodium intake below 2,300 mg per day.  Getting at least 30-45 minutes of aerobic exercise at least 4 times per week.  Losing weight if necessary.  Not smoking.  Limiting alcoholic beverages.  Learning ways to reduce stress. Your health care provider may prescribe medicine if lifestyle changes are not enough to get your blood pressure under control, and if one of the following is true:  You are 18-59 years of age and your systolic blood pressure is above 140.  You are 60 years of age or older, and your systolic blood pressure is above 150.  Your diastolic blood pressure is above 90.  You have diabetes, and your systolic blood pressure is over 140 or your diastolic blood pressure is over 90.  You have kidney disease and your blood pressure is above 140/90.  You have heart disease and your blood pressure is above 140/90. Your personal target blood pressure may vary depending on your medical conditions, your age, and other factors. HOME CARE INSTRUCTIONS    Have your blood pressure rechecked as directed by your health care provider.   Take medicines only as directed by your health care provider. Follow the directions carefully. Blood pressure medicines must be taken as prescribed. The medicine does not work as well when you skip doses. Skipping doses also puts you at risk for  problems.  Do not smoke.   Monitor your blood pressure at home as directed by your health care provider. SEEK MEDICAL CARE IF:   You think you are having a reaction to medicines taken.  You have recurrent headaches or feel dizzy.  You have swelling in your ankles.  You have trouble with your vision. SEEK IMMEDIATE MEDICAL CARE IF:  You develop a severe headache or confusion.  You have unusual weakness, numbness, or feel faint.  You have severe chest or abdominal pain.  You vomit repeatedly.  You have trouble breathing. MAKE SURE YOU:   Understand these instructions.  Will watch your condition.  Will get help right away if you are not doing well or get worse.   This information is not intended to replace advice given to you by your health care provider. Make sure you discuss any questions you have with your health care provider.   Document Released: 08/19/2005 Document Revised: 01/03/2015 Document Reviewed: 06/11/2013 Elsevier Interactive Patient Education [employer].   I believe your elevated BP in the other doctors office is related to anxiety. It is well controlled at all out visits, including today.  Eat a low salt diet and make certain to get exercise.  If your BP is above 140/90 when checked with new cuff on 3 occasions in a row then please call in to be seen

## 2015-10-26 ENCOUNTER — Ambulatory Visit (INDEPENDENT_AMBULATORY_CARE_PROVIDER_SITE_OTHER): Payer: Commercial Managed Care - PPO | Admitting: Licensed Clinical Social Worker

## 2015-10-26 DIAGNOSIS — F331 Major depressive disorder, recurrent, moderate: Secondary | ICD-10-CM | POA: Diagnosis not present

## 2015-10-26 DIAGNOSIS — F411 Generalized anxiety disorder: Secondary | ICD-10-CM | POA: Diagnosis not present

## 2015-10-26 NOTE — Progress Notes (Signed)
   THERAPIST PROGRESS NOTE  Session Time: 1:10pm-2:00pm  Participation Level: Active  Behavioral Response: Casual Alert Depressed   Type of Therapy: Individual Therapy  Treatment Goals addressed: depression/anhedonia  Interventions: CBT, mindfulness  Suicidal/Homicidal: Denied both  Therapist Interventions:   Had patient complete a PHQ-9 and GAD-7 to assess for severity of depression and anxiety symptoms. Encouraged patient to reframe some of the messages she has been telling herself about how she has been coping. Specifically recommended not using the word "trying" and just give herself credit for what she is doing despite how she feels. Reviewed the skill of using mindfulness of thoughts and feelings and validating both of those things rather than judging from in a negative way. Introduced the technique for coping with worries which involves talking back to your "worry bully."  Encouraged patient to use imagery to enhance the technique. Discussed patient's fear of pursuing a different job. Explained how she might go about facing her fears in a gradual manner.  Summary:      Depression screen [facility] 2/9 10/26/2015 08/18/2015 06/22/2015  Decreased Interest 2 1 1   Down, Depressed, Hopeless 3 1 1   PHQ - 2 Score 5 2 2   Altered sleeping 1 1 2   Tired, decreased energy 2 2 3   Change in appetite 2 1 2   Feeling bad or failure about yourself  2 2 2   Trouble concentrating 2 0 0  Moving slowly or fidgety/restless 1 0 0  Suicidal thoughts 0 0 0  PHQ-9 Score 15 8 11   Difficult doing work/chores Somewhat difficult - -   GAD 7 : Generalized Anxiety Score 10/26/2015 08/18/2015  Nervous, Anxious, on Edge 3 1  Control/stop worrying 3 1  Worry too much - different things 3 2  Trouble relaxing 3 0  Restless 2 0  Easily annoyed or irritable 1 0  Afraid - awful might happen 3 1  Total GAD 7 Score 18 5  Anxiety Difficulty Somewhat difficult -     Reported feeling as though she is only doing  what is necessary to survive at this time. Exhausted when she gets home from work. Says she doesn't have energy to do much of anything at that point. Reports she has been listening to mindfulness meditations at night for about 10-15 minutes.  Repeatedly emphasized how she is "trying" to cope effectively.   Acknowledges her current job is not a good fit.  Has updated her resume, has contacted some recruiters to learn about potential job opportunities, and is looking online for opportunities as well.  Despite doing all this she indicates she feels she isn't doing enough.        Plan:   Scheduled to return on March 9th.  Diagnosis: Major Depressive Disorder, recurrent, moderate                         Generalized Anxiety Disorder      Armandina Stammer 10/26/2015

## 2015-11-09 ENCOUNTER — Ambulatory Visit (HOSPITAL_COMMUNITY): Payer: Self-pay | Admitting: Licensed Clinical Social Worker

## 2015-11-09 ENCOUNTER — Ambulatory Visit (INDEPENDENT_AMBULATORY_CARE_PROVIDER_SITE_OTHER): Payer: Commercial Managed Care - PPO | Admitting: Licensed Clinical Social Worker

## 2015-11-09 DIAGNOSIS — F331 Major depressive disorder, recurrent, moderate: Secondary | ICD-10-CM | POA: Diagnosis not present

## 2015-11-09 DIAGNOSIS — F411 Generalized anxiety disorder: Secondary | ICD-10-CM

## 2015-11-09 NOTE — Progress Notes (Signed)
   THERAPIST PROGRESS NOTE  Session Time: 1:10pm-1:45pm  Participation Level: Active  Behavioral Response: Casual Alert Depressed   Type of Therapy: Individual Therapy  Treatment Goals addressed: depression/anhedonia  Interventions: mindfulness  Suicidal/Homicidal: Denied both  Therapist Interventions:   Introduced patient to a specific mindfulness meditation called the body scan.  Emphasized that there is no "right way to do it" and the purpose is to challenge yourself to take control of your focus by shifting it to different areas of your body.  Also emphasized that the aim of the practice isn't to strive for relaxation but to tune in to what sensations are happening in the body.  Encouraged patient to listen to the body scan on a daily basis.   Summary:  Reported no change in symptoms in the past 2 weeks. Still feeling tired and dreading work. Sleeping an average of 4-1/2 hours each night. Taking trazodone to help her fall asleep but has been waking up in the middle of the night and has not been able to fall back asleep. Thank for therapist for the information about [facility] in [place]. Indicated that she may look into some of the services they offer. Reported that she continues to practice mindfulness and it is somewhat helpful, but she is finding it hard not to get lost in her thoughts. She has the mindfulness workbook therapist read aloud from today. Indicated she plans to practice the body scan most days of the week.    Patient asked to shorten the length of the session because she wanted to make sure she got back to work on time.          Plan:   Needs to schedule next therapy appointment, preferably in about 2 weeks.  Diagnosis: Major Depressive Disorder, recurrent, moderate                         Generalized Anxiety Disorder      Armandina Stammer 11/09/2015

## 2015-11-23 ENCOUNTER — Ambulatory Visit (INDEPENDENT_AMBULATORY_CARE_PROVIDER_SITE_OTHER): Payer: Commercial Managed Care - PPO | Admitting: Neurology

## 2015-11-23 ENCOUNTER — Telehealth: Payer: Self-pay | Admitting: Neurology

## 2015-11-23 ENCOUNTER — Encounter: Payer: Self-pay | Admitting: Neurology

## 2015-11-23 VITALS — BP 118/82 | HR 76 | Resp 20 | Ht 66.0 in | Wt 215.0 lb

## 2015-11-23 DIAGNOSIS — R0683 Snoring: Secondary | ICD-10-CM

## 2015-11-23 DIAGNOSIS — F32A Depression, unspecified: Secondary | ICD-10-CM

## 2015-11-23 DIAGNOSIS — F329 Major depressive disorder, single episode, unspecified: Secondary | ICD-10-CM | POA: Diagnosis not present

## 2015-11-23 DIAGNOSIS — R0689 Other abnormalities of breathing: Secondary | ICD-10-CM | POA: Diagnosis not present

## 2015-11-23 DIAGNOSIS — I671 Cerebral aneurysm, nonruptured: Secondary | ICD-10-CM | POA: Diagnosis not present

## 2015-11-23 NOTE — Patient Instructions (Addendum)
Please remember to try to maintain good sleep hygiene, which means: Keep a regular sleep and wake schedule, try not to exercise or have a meal within 2 hours of your bedtime, try to keep your bedroom conducive for sleep, that is, cool and dark, without light distractors such as an illuminated alarm clock, and refrain from watching TV right before sleep or in the middle of the night and do not keep the TV or radio on during the night. Also, try not to use or play on electronic devices at bedtime, such as your cell phone, tablet PC or laptop. If you like to read at bedtime on an electronic device, try to dim the background light as much as possible. Do not eat in the middle of the night.   We will request a sleep study.    We will look for leg twitching and snoring or sleep apnea.   For chronic insomnia, you are best followed by a psychiatrist and/or sleep psychologist.   We will call you with the sleep study results and make a follow up appointment if needed.      Sleep Studies A sleep study (polysomnogram) is a series of tests done while you are sleeping. It can show how well you sleep. This can help your health care provider diagnose a sleep disorder and show how severe your sleep disorder is. A sleep study may lead to treatment that will help you sleep better and prevent other medical problems caused by poor sleep. If you have a sleep disorder, you may also be at risk for:   Sleep-related accidents.  High blood pressure.  Heart disease.  Stroke.  Other medical conditions. Sleep disorders are common. Your health care provider may suspect a sleep disorder if you:  Have loud snoring most nights.  Have brief periods when you stop breathing at night.  Feel sleepy on most days.  Fall asleep suddenly during the day.  Have trouble falling asleep or staying asleep.  Feel like you need to move your legs when trying to fall asleep.  Have dreams that seem very real shortly after falling  asleep.  Feel like you cannot move when you first wake up. WHICH TESTS WILL I NEED TO HAVE?  Most sleep studies last all night and include these tests:  Recordings of your brain activity.  Recordings of your eye movements.  Recording of your heart rate and rhythm.  Blood pressure readings.  Readings of the amount of oxygen in your blood.  Measurements of your chest and belly movement as you breathe during sleep. If you have signs of the sleep disorder called sleep apnea during your test, you may get a mask to wear for the second half of the night.   The mask provides continuous positive airway pressure (CPAP). This may improve sleep apnea significantly.  You will then have all tests done again with the mask in place to see if your measurements and recordings change. HOW ARE SLEEP STUDIES DONE? Most sleep studies are done over one full night of sleep.   You will arrive at the study center in the evening and can go home in the morning.  Bring your pajamas and toothbrush.  Do not have caffeine on the day of your sleep study.  Your health care provider will let you know if you need to stop taking any of your regular medicines before the test. To do the tests included in a polysomnogram, you will have:  Round, sticky patches with sensors  attached to recording wires (electrodes) placed on your scalp, face, chest, and limbs.  Wires from all the electrodes and sensors run from your bed to a computer. The wires can be taken off and put back on if you need to get out of bed to go to the bathroom.  A sensor placed over your nose to measure airflow.  A finger clip put on one finger to measure your blood oxygen level.  A belt around your belly and a belt around your chest to measure breathing movements. WHERE ARE SLEEP STUDIES DONE?  Sleep studies are done at sleep centers. A sleep center may be inside a hospital, office, or clinic.  The room where you have the study may look like a  hospital room or a hotel room. The health care providers doing the study may come in and out of the room during the study. Most of the time, they will be in another room monitoring your test.  HOW IS INFORMATION FROM SLEEP STUDIES HELPFUL? A polysomnogram can be used along with your medical history and a physical exam to diagnose conditions, such as:  Sleep apnea.  Restless legs syndrome.  Sleep-related seizure disorders.  Sleep-related movement disorders. A medical doctor who specializes in sleep will evaluate your sleep study. The specialist will share the results with your primary health care provider. Treatments based on your sleep study may include:  Improving your sleep habits (sleep hygiene).  Wearing a CPAP mask.  Wearing an oral device at night to improve breathing and reduce snoring.  Taking medicine for:  Restless legs syndrome.  Sleep-related seizure disorder.  Sleep-related movement disorder.   This information is not intended to replace advice given to you by your health care provider. Make sure you discuss any questions you have with your health care provider.   Document Released: 02/23/2003 Document Revised: 09/09/2014 Document Reviewed: 10/25/2013 Elsevier Interactive Patient Education 2016 [employer] Major Depressive Disorder Major depressive disorder is a mental illness. It also may be called clinical depression or unipolar depression. Major depressive disorder usually causes feelings of sadness, hopelessness, or helplessness. Some people with this disorder do not feel particularly sad but lose interest in doing things they used to enjoy (anhedonia). Major depressive disorder also can cause physical symptoms. It can interfere with work, school, relationships, and other normal everyday activities. The disorder varies in severity but is longer lasting and more serious than the sadness we all feel from time to time in our lives. Major depressive disorder often is  triggered by stressful life events or major life changes. Examples of these triggers include divorce, loss of your job or home, a move, and the death of a family member or close friend. Sometimes this disorder occurs for no obvious reason at all. People who have family members with major depressive disorder or bipolar disorder are at higher risk for developing this disorder, with or without life stressors. Major depressive disorder can occur at any age. It may occur just once in your life (single episode major depressive disorder). It may occur multiple times (recurrent major depressive disorder). SYMPTOMS People with major depressive disorder have either anhedonia or depressed mood on nearly a daily basis for at least 2 weeks or longer. Symptoms of depressed mood include:  Feelings of sadness (blue or down in the dumps) or emptiness.  Feelings of hopelessness or helplessness.  Tearfulness or episodes of crying (may be observed by others).  Irritability (children and adolescents). In addition to depressed mood or anhedonia  or both, people with this disorder have at least four of the following symptoms:  Difficulty sleeping or sleeping too much.   Significant change (increase or decrease) in appetite or weight.   Lack of energy or motivation.  Feelings of guilt and worthlessness.   Difficulty concentrating, remembering, or making decisions.  Unusually slow movement (psychomotor retardation) or restlessness (as observed by others).   Recurrent wishes for death, recurrent thoughts of self-harm (suicide), or a suicide attempt. People with major depressive disorder commonly have persistent negative thoughts about themselves, other people, and the world. People with severe major depressive disorder may experiencedistorted beliefs or perceptions about the world (psychotic delusions). They also may see or hear things that are not real (psychotic hallucinations). DIAGNOSIS Major depressive  disorder is diagnosed through an assessment by your health care provider. Your health care provider will ask aboutaspects of your daily life, such as mood,sleep, and appetite, to see if you have the diagnostic symptoms of major depressive disorder. Your health care provider may ask about your medical history and use of alcohol or drugs, including prescription medicines. Your health care provider also may do a physical exam and blood work. This is because certain medical conditions and the use of certain substances can cause major depressive disorder-like symptoms (secondary depression). Your health care provider also may refer you to a mental health specialist for further evaluation and treatment. TREATMENT It is important to recognize the symptoms of major depressive disorder and seek treatment. The following treatments can be prescribed for this disorder:   Medicine. Antidepressant medicines usually are prescribed. Antidepressant medicines are thought to correct chemical imbalances in the brain that are commonly associated with major depressive disorder. Other types of medicine may be added if the symptoms do not respond to antidepressant medicines alone or if psychotic delusions or hallucinations occur.  Talk therapy. Talk therapy can be helpful in treating major depressive disorder by providing support, education, and guidance. Certain types of talk therapy also can help with negative thinking (cognitive behavioral therapy) and with relationship issues that trigger this disorder (interpersonal therapy). A mental health specialist can help determine which treatment is best for you. Most people with major depressive disorder do well with a combination of medicine and talk therapy. Treatments involving electrical stimulation of the brain can be used in situations with extremely severe symptoms or when medicine and talk therapy do not work over time. These treatments include electroconvulsive therapy,  transcranial magnetic stimulation, and vagal nerve stimulation.   This information is not intended to replace advice given to you by your health care provider. Make sure you discuss any questions you have with your health care provider.   Document Released: 12/14/2012 Document Revised: 09/09/2014 Document Reviewed: 12/14/2012 Elsevier Interactive Patient Education [employer].

## 2015-11-23 NOTE — Progress Notes (Signed)
SLEEP MEDICINE CLINIC   Provider:  Larey Seat, M D  Referring Provider: Sheralyn Boatman, MD   Primary Care Physician:  Howard Pouch, DO, [facility] , [place]    HPI:  [name] is a 45 y.o. female , seen here as a referral from Dr. Caprice Beaver and Debbora Dus, FNP  Mrs. Giovanni has been referred to [place] for a sleep evaluation. She is struggling with depression and anxiety, the patient was diagnosed with major depressive disorder recurrent but not responding to typical antidepressant medications. 6 weeks ago she was started on lithium. She also takes other psychotropic medications. She has a history of headaches with migrainous character, nocturia and snoring excessive daytime sleepiness, fragmented sleep, hypertension and she had a brain aneurysm. She is referred to see if she has actually sleep apnea and if her insomnia component is associated with fragmented slee  Sleep habits are as follows: The patient reports a deterioration in her sleep habits and quality of sleep over the last 6 month. Sometimes she has a very difficult time going to sleep other nights she falls asleep promptly but she has been placed on sleep aids. She used to wake up spontaneously at 5 AM but wasn't too worried about it as he usually had gotten 7 or even 8 hours of sleep by that time but over the last 6 months she has begun waking up more frequently and earlier, now at 2 AM, 3 AM and multiple times each night. She reports that she has frequently daytime headache but headaches have not woken her from sleep and are not usually present when she wakes up. Her bedroom is cool, quiet and dark. She sleeps with earplugs she sleeps alone, her husband is a shift Insurance underwriter. The patient herself works day shifts. She sleeps usually on one pillow, sometimes two , and prefers the lateral sleep position. She has often woken up from her own snoring and usually finds herself in supine position when this happens. She has a lot of  sinus problems and a deviated nasal septum forcing her to breathe through the mouth, this additionally promote snoring. She reports vivid dreams, often about work, her mind is racing.  She does not have the urge to urinate at night at least not every night. This has also not ( yet ) changed with the onset of lithium therapy. She is still very depressed, fatigued and tired.  Sleep medical history and family sleep history: father has snoring and OSA, is noncompliant with CPAP. He sleeps in a recliner, he has pulmonary fibrosis from asbestosis.   Social history: The patient is a nonsmoker, she is full time gainfully employed, she does on occasion drink alcohol, wine or beer, not exceeding 4 drinks a week. Because of her excessive fatigue and sleepiness she begun drinking coffee lately, she drinks some soda as well but tries to keep it caffeine free. Rarely drinks ice tea or tea.  Review of Systems: Out of a complete 14 system review, the patient complains of only the following symptoms, and all other reviewed systems are negative. See above   Epworth score 6 , Fatigue severity score 48  , depression score see PHQ9    Social History   Social History  . Marital Status: Married    Spouse Name: N/A  . Number of Children: 0  . Years of Education: N/A   Occupational History  .     Social History Main Topics  . Smoking status: Never Smoker   .  Smokeless tobacco: Never Used  . Alcohol Use: Yes     Comment: wine and beer on occasion  . Drug Use: No  . Sexual Activity: Yes    Birth Control/ Protection: Pill   Other Topics Concern  . Not on file   Social History Narrative    Family History  Problem Relation Age of Onset  . Heart disease Mother 62    cad  . Lupus Mother   . COPD Mother   . Arthritis Mother     rheumatoid  . AVM Mother   . Diabetes Mother   . Bipolar disorder Mother   . Drug abuse Mother   . Diabetes Father   . Cancer Father     thyroid  . Depression Sister     . Diabetes Sister   . Hyperlipidemia Sister   . Hypertension Sister   . Cancer Paternal Grandfather     colon  . Alcohol abuse Paternal Uncle   . Drug abuse Maternal Grandfather   . Alcohol abuse Maternal Grandfather     Past Medical History  Diagnosis Date  . PONV (postoperative nausea and vomiting)   . Hyperlipidemia     takes Atorvastatin daily  . Headache(784.0)   . SAH (subarachnoid hemorrhage) (HCC)     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  . Hypertension     takes Amlodipine and Propranolol daily  . Anxiety     takes Ativan daily as needed  . Depression     psychiatry  . Insomnia     takes Trazodone at bedtime  . GERD (gastroesophageal reflux disease)   . Arthritis     Past Surgical History  Procedure Laterality Date  . Myomectomy  2009  . Laparotomy    . Myomectomy  05/16/2011    Procedure: MYOMECTOMY;  Surgeon: Margarette Asal;  Location: [facility];  Service: Gynecology;  Laterality: N/A;  abdominal  . Radiology with anesthesia N/A 02/18/2013    Procedure: RADIOLOGY WITH ANESTHESIA;  Surgeon: Rob Hickman, MD;  Location: [facility];  Service: Radiology;  Laterality: N/A;  . Brain surgery    . Radiology with anesthesia N/A 01/26/2014    Procedure: RADIOLOGY WITH ANESTHESIA;  Surgeon: Rob Hickman, MD;  Location: [facility];  Service: Radiology;  Laterality: N/A;  . Dilation and curettage of uterus  2009  . Radiology with anesthesia N/A 07/25/2014    Procedure: RADIOLOGY WITH ANESTHESIA;  Surgeon: Rob Hickman, MD;  Location: [facility];  Service: Radiology;  Laterality: N/A;    Current Outpatient Prescriptions  Medication Sig Dispense Refill  . albuterol (PROVENTIL HFA;VENTOLIN HFA) 108 (90 BASE) MCG/ACT inhaler Inhale 2 puffs into the lungs every 4 (four) hours as needed for wheezing or shortness of breath. 1 Inhaler 0  . amLODipine (NORVASC) 10 MG tablet Take 1 tablet (10 mg total) by mouth daily. 30 tablet 5  . aspirin EC 325 MG tablet Take 325  mg by mouth daily.    Marland Kitchen atorvastatin (LIPITOR) 40 MG tablet Take 1 tablet (40 mg total) by mouth daily. 30 tablet 5  . clonazePAM (KLONOPIN) 0.5 MG tablet Take 0.5 mg by mouth 2 (two) times daily as needed for anxiety.    Marland Kitchen ibuprofen (ADVIL,MOTRIN) 200 MG tablet Take 400-800 mg by mouth every 6 (six) hours as needed (for pain).    Marland Kitchen lithium carbonate 300 MG capsule Take 300 mg by mouth 2 (two) times daily with a meal.    . LORazepam (ATIVAN)  0.5 MG tablet Take 0.5 mg by mouth 2 (two) times daily as needed for anxiety.    . Multiple Vitamin (MULTIVITAMIN WITH MINERALS) TABS tablet Take 1 tablet by mouth daily.    . norgestimate-ethinyl estradiol (ORTHO-CYCLEN,SPRINTEC,PREVIFEM) 0.25-35 MG-MCG tablet Take 1 tablet by mouth daily.    . Omega-3 Fatty Acids (FISH OIL PO) Take 1 capsule by mouth daily.    . propranolol ER (INDERAL LA) 80 MG 24 hr capsule Take 1 capsule (80 mg total) by mouth at bedtime. 30 capsule 6  . SAPHRIS 10 MG SUBL Take 1 tablet by mouth every evening.  0  . traZODone (DESYREL) 150 MG tablet TAKE 1 TABLET (150 MG TOTAL) BY MOUTH AT BEDTIME.  2  . vitamin B-12 (CYANOCOBALAMIN) 1000 MCG tablet 2000 mcg QD 14 days, then 1000 mcg daily 90 tablet 0   No current facility-administered medications for this visit.    Allergies as of 11/23/2015 - Review Complete 10/20/2015  Allergen Reaction Noted  . Ace inhibitors Swelling and Other (See Comments) 01/12/2014    Vitals: There were no vitals taken for this visit. Last Weight:  Wt Readings from Last 1 Encounters:  10/20/15 221 lb (100.245 kg)   [id number] is no weight on file to calculate BMI.     Last Height:   Ht Readings from Last 1 Encounters:  05/11/15 5\' 6"  (1.676 m)    Physical exam:  General: The patient is awake, alert and appears not in acute distress. The patient is well groomed. Head: Normocephalic, atraumatic. Neck is supple. Mallampati 4, still has tonsils.  neck circumference: 17. Nasal airflow congested , TMJ  is not  evident . Retrognathia is seen.  Cardiovascular:  Regular rate and rhythm, without  murmurs or carotid bruit, and without distended neck veins. Respiratory: Lungs are clear to auscultation. Skin:  Without evidence of edema, or rash Trunk: BMI is elevated  The patient's posture is erect  Neurologic exam : The patient is awake and alert, oriented to place and time.   Memory subjective  described as intact.  Attention span & concentration ability appears normal.  Speech is fluent,  without  dysarthria, dysphonia or aphasia.  Mood and affect are depressed, slowed .  Cranial nerves: Pupils are equal and briskly reactive to light. Funduscopic exam without  evidence of pallor or edema. Extraocular movements  in vertical and horizontal planes intact and without nystagmus. Visual fields by finger perimetry are intact. Taste has changed - metallic taste since being on lithium, while sense smell is intact, but she is congested.  Hearing to finger rub intact. Facial sensation intact to fine touch.Facial motor strength is symmetric and tongue and uvula move midline. Shoulder shrug was symmetrical.   Motor exam: Normal tone, muscle bulk and symmetric strength in all extremities. Sensory:  Fine touch, pinprick and vibration were tested in all extremities and are normal. Coordination: Rapid alternating movements in the fingers/hands was normal.  Finger-to-nose maneuver  normal without evidence of ataxia, dysmetria or tremor. Gait and station: Patient walks without assistive device and is able unassisted to climb up to the exam table.  Strength within normal limits.  Stance is stable and normal..Tandem gait is unfragmented. Turns with 3 Steps. Deep tendon reflexes: in the  upper and lower extremities are symmetric and intact. Babinski maneuver response is  downgoing.  The patient was advised of the nature of the diagnosed sleep disorder , the treatment options and risks for general a health and  wellness arising from  not treating the condition.  I spent more than 45 minutes of face to face time with the patient. Greater than 50% of time was spent in counseling and coordination of care. We have discussed the diagnosis and differential and I answered the patient's questions.     Assessment:  After physical and neurologic examination, review of laboratory studies,  Personal review of imaging studies, reports of other /same  Imaging studies , aneurysm procedure- aneyrysm location   . Results of polysomnography/ neurophysiology testing and pre-existing records as far as provided in visit., my assessment is   1) sleep deprivation, fatigue and insomnia :  snoring,  Waking herself up, gaining weight, getting less sleep high risk of apnea , partially due to elevated BMI and neck size/ Mallampati.  2)  Depression and anxiety ; reports waking from dreaming , early AM awakenings. Needs a sleep study with SPLIT protocol. Waking with a dry moth. No  headaches in AM.  The patient was prescribed Ambien and trazodone but should only take one of the drugs I would recommend that she stays with trazodone which seems to have less of a hangover effect for her. Her experience with Ambien has been that she is more daytime sleepy.  3) depression and anxiety currently treated with lithium antidepressants as well as lorazepam and Klonopin. I would like for the patient to concentrate on one benzodiazepine but not too. She should also discuss this with her provider.  4) brain aneurysm was coiled 2 years ago. Rupture. Mrs. 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  Plan:  Treatment plan and additional workup :  SPLIT night  polysomnography. No capnography, watch for hypoxia.  Rv after sleep study .   Asencion Partridge Jahred Tatar MD  11/23/2015  PS CLINICAL DATA: Follow-up aneurysm coiling  EXAM: MRI HEAD WITHOUT AND WITH CONTRAST  MRA HEAD WITHOUT CONTRAST  TECHNIQUE: Multiplanar, multiecho pulse sequences of the brain and surrounding structures were obtained without and with intravenous contrast. Angiographic images of the head were obtained using MRA technique without contrast.  CONTRAST: 60mL MULTIHANCE GADOBENATE DIMEGLUMINE 529 MG/ML IV SOLN  COMPARISON: MRI and MRA 12/20/2014  FINDINGS: MRI HEAD FINDINGS  Ventricle size is normal. Cerebral volume is normal.  Negative for acute infarct. Scattered small hyperintensities left frontal white matter are stable and most consistent with chronic microvascular ischemia. No large territory infarct. Brainstem and basal ganglia normal.  Negative for intracranial hemorrhage. Negative for mass or edema  Artifact from aneurysm coiling in the anterior communicating artery region  Postcontrast imaging reveals normal enhancement. No enhancing mass lesion. Leptomeningeal enhancement normal. Normal dural venous sinus enhancement.  MRA HEAD FINDINGS  Right carotid artery widely patent. Fetal origin right posterior communicating artery. Hypoplastic right A1 segment. Right middle cerebral artery appears normal  Left cavernous carotid widely patent. Both anterior cerebral arteries supplied from the left due to hypoplastic right A1 segment. 2 mm remnant aneurysm in the anterior communicating artery unchanged from the prior MRA. Left middle cerebral artery widely patent  Both vertebral arteries are patent to the basilar. PICA patent. Basilar widely patent. Superior cerebellar and posterior cerebral arteries patent bilaterally. Fetal origin right posterior communicating artery.  IMPRESSION: Minimal chronic microvascular  ischemic change in  the white matter, stable from the prior study. No acute intracranial abnormality CC: Ma Hillock, [address] [place], [place] [id number]

## 2015-11-23 NOTE — Telephone Encounter (Signed)
Dear Sim Boast,  I have just met a patient that follows your office and is currently under the care of [facility]. She carries a diagnosis of atypical depression or at least a depression that is not reacting or responding to typical treatment options. About 6 weeks ago she started on lithium I am worried that her current medication regimen may need to be adjusted and I would like you to have a look at it. I don't think Magda Paganini will mind if you have a visit with her once? She is very depressed and abulic, somehow this looks organic. Asencion Partridge D.

## 2015-11-27 NOTE — Telephone Encounter (Signed)
Telephone note faxed to Dr. Arvil Persons office for Dr. Caprice Beaver review.

## 2015-11-28 ENCOUNTER — Encounter: Payer: Self-pay | Admitting: *Deleted

## 2015-12-22 ENCOUNTER — Ambulatory Visit (INDEPENDENT_AMBULATORY_CARE_PROVIDER_SITE_OTHER): Payer: Commercial Managed Care - PPO | Admitting: Neurology

## 2015-12-22 DIAGNOSIS — R0683 Snoring: Secondary | ICD-10-CM

## 2015-12-22 DIAGNOSIS — R0689 Other abnormalities of breathing: Secondary | ICD-10-CM

## 2015-12-22 DIAGNOSIS — G4710 Hypersomnia, unspecified: Secondary | ICD-10-CM

## 2015-12-22 DIAGNOSIS — I671 Cerebral aneurysm, nonruptured: Secondary | ICD-10-CM

## 2015-12-22 DIAGNOSIS — F32A Depression, unspecified: Secondary | ICD-10-CM

## 2015-12-22 DIAGNOSIS — F329 Major depressive disorder, single episode, unspecified: Secondary | ICD-10-CM

## 2015-12-26 ENCOUNTER — Telehealth: Payer: Self-pay

## 2015-12-26 NOTE — Telephone Encounter (Signed)
Spoke to pt regarding her sleep study results. I advised her that her study does not reveal any significant sleep apnea or significant periodic limb movements of sleep resulting in significant sleep disruption. I advised pt to sleep on her side. I advised pt to avoid sedative-hypnotics which may worsen sleep apnea, alcohol and tobacco. Pt was advised to not drive or operate hazardous machinery when sleepy. Pt was advised to lose weight, diet, and exercise if not contraindicated by her other physicians. I advised pt to avoid caffeine containing beverages and chocolate. Pt verbalized understanding.  I offered a follow up appt with Dr. Brett Fairy but pt declined since she is "already seeing another doctor." Pt asked that I fax the sleep study results to Debbora Dus, NP.  I called Dr. Arvil Persons office and confirmed that Debbora Dus is practicing there. This was confirmed and the study faxed to that office.

## 2016-01-01 ENCOUNTER — Other Ambulatory Visit: Payer: Self-pay | Admitting: *Deleted

## 2016-01-01 DIAGNOSIS — Z6836 Body mass index (BMI) 36.0-36.9, adult: Secondary | ICD-10-CM

## 2016-01-01 DIAGNOSIS — I10 Essential (primary) hypertension: Secondary | ICD-10-CM

## 2016-01-01 DIAGNOSIS — E039 Hypothyroidism, unspecified: Secondary | ICD-10-CM

## 2016-01-01 MED ORDER — ATORVASTATIN CALCIUM 40 MG PO TABS: 40.0000 mg | ORAL_TABLET | Freq: Every day | ORAL | Status: DC

## 2016-01-01 MED ORDER — AMLODIPINE BESYLATE 10 MG PO TABS: 10.0000 mg | ORAL_TABLET | Freq: Every day | ORAL | Status: DC

## 2016-01-01 MED ORDER — PROPRANOLOL HCL ER 80 MG PO CP24: 80.0000 mg | ORAL_CAPSULE | Freq: Every day | ORAL | Status: DC

## 2016-01-04 ENCOUNTER — Other Ambulatory Visit (HOSPITAL_COMMUNITY): Payer: Self-pay | Admitting: Interventional Radiology

## 2016-01-04 ENCOUNTER — Telehealth (HOSPITAL_COMMUNITY): Payer: Self-pay

## 2016-01-04 DIAGNOSIS — I729 Aneurysm of unspecified site: Secondary | ICD-10-CM

## 2016-01-04 NOTE — Telephone Encounter (Signed)
Called to schedule 6 month f/u angiogram with Dr. Estanislado Pandy. Left message for pt to return call. AW

## 2016-01-25 ENCOUNTER — Other Ambulatory Visit: Payer: Self-pay | Admitting: General Surgery

## 2016-01-26 ENCOUNTER — Other Ambulatory Visit (HOSPITAL_COMMUNITY): Payer: Self-pay | Admitting: Interventional Radiology

## 2016-01-26 ENCOUNTER — Encounter (HOSPITAL_COMMUNITY): Payer: Self-pay

## 2016-01-26 ENCOUNTER — Ambulatory Visit (HOSPITAL_COMMUNITY)
Admission: RE | Admit: 2016-01-26 | Discharge: 2016-01-26 | Disposition: A | Discharge status: Home / Self Care | Payer: Commercial Managed Care - PPO | Source: Ambulatory Visit | Attending: Interventional Radiology | Admitting: Interventional Radiology

## 2016-01-26 VITALS — BP 124/83 | HR 55 | Temp 98.7°F | Resp 20 | Ht 66.0 in | Wt 216.0 lb

## 2016-01-26 DIAGNOSIS — I729 Aneurysm of unspecified site: Secondary | ICD-10-CM

## 2016-01-26 DIAGNOSIS — K219 Gastro-esophageal reflux disease without esophagitis: Secondary | ICD-10-CM | POA: Insufficient documentation

## 2016-01-26 DIAGNOSIS — M1990 Unspecified osteoarthritis, unspecified site: Secondary | ICD-10-CM | POA: Diagnosis not present

## 2016-01-26 DIAGNOSIS — I10 Essential (primary) hypertension: Secondary | ICD-10-CM | POA: Insufficient documentation

## 2016-01-26 DIAGNOSIS — Z7982 Long term (current) use of aspirin: Secondary | ICD-10-CM | POA: Diagnosis not present

## 2016-01-26 DIAGNOSIS — Z888 Allergy status to other drugs, medicaments and biological substances status: Secondary | ICD-10-CM | POA: Insufficient documentation

## 2016-01-26 DIAGNOSIS — E785 Hyperlipidemia, unspecified: Secondary | ICD-10-CM | POA: Insufficient documentation

## 2016-01-26 DIAGNOSIS — G4700 Insomnia, unspecified: Secondary | ICD-10-CM | POA: Diagnosis not present

## 2016-01-26 DIAGNOSIS — F419 Anxiety disorder, unspecified: Secondary | ICD-10-CM | POA: Diagnosis not present

## 2016-01-26 DIAGNOSIS — Z79899 Other long term (current) drug therapy: Secondary | ICD-10-CM | POA: Diagnosis not present

## 2016-01-26 DIAGNOSIS — Z833 Family history of diabetes mellitus: Secondary | ICD-10-CM | POA: Diagnosis not present

## 2016-01-26 DIAGNOSIS — I671 Cerebral aneurysm, nonruptured: Secondary | ICD-10-CM | POA: Diagnosis present

## 2016-01-26 DIAGNOSIS — Z8249 Family history of ischemic heart disease and other diseases of the circulatory system: Secondary | ICD-10-CM | POA: Insufficient documentation

## 2016-01-26 DIAGNOSIS — F329 Major depressive disorder, single episode, unspecified: Secondary | ICD-10-CM | POA: Insufficient documentation

## 2016-01-26 LAB — BASIC METABOLIC PANEL
Anion gap: 7 (ref 5–15)
BUN: 9 mg/dL (ref 6–20)
CO2: 26 mmol/L (ref 22–32)
Calcium: 9.2 mg/dL (ref 8.9–10.3)
Chloride: 106 mmol/L (ref 101–111)
Creatinine, Ser: 0.92 mg/dL (ref 0.44–1.00)
GFR calc Af Amer: >60 mL/min (ref >60)
GFR calc non Af Amer: >60 mL/min (ref >60)
Glucose, Bld: 117 mg/dL — ABNORMAL HIGH (ref 65–99)
Potassium: 3.9 mmol/L (ref 3.5–5.1)
Sodium: 139 mmol/L (ref 135–145)

## 2016-01-26 LAB — CBC
HCT: 40.2 % (ref 36.0–46.0)
Hemoglobin: 12.8 g/dL (ref 12.0–15.0)
MCH: 30.4 pg (ref 26.0–34.0)
MCHC: 31.8 g/dL (ref 30.0–36.0)
MCV: 95.5 fL (ref 78.0–100.0)
Platelets: 300 10*3/uL (ref 150–400)
RBC: 4.21 MIL/uL (ref 3.87–5.11)
RDW: 11.8 % (ref 11.5–15.5)
WBC: 8 10*3/uL (ref 4.0–10.5)

## 2016-01-26 LAB — PROTIME-INR
INR: 0.88 (ref 0.00–1.49)
Prothrombin Time: 12.2 seconds (ref 11.6–15.2)

## 2016-01-26 LAB — PREGNANCY, URINE: Preg Test, Ur: NEGATIVE

## 2016-01-26 LAB — APTT: aPTT: 28 seconds (ref 24–37)

## 2016-01-26 MED ORDER — HEPARIN SOD (PORK) LOCK FLUSH 100 UNIT/ML IV SOLN
INTRAVENOUS | Status: AC
  Filled 2016-01-26: qty 15

## 2016-01-26 MED ORDER — HEPARIN SODIUM (PORCINE) 1000 UNIT/ML IJ SOLN
INTRAMUSCULAR | Status: AC | PRN
Start: 2016-01-26 — End: 2016-01-26
  Administered 2016-01-26: 1000 [IU] via INTRAVENOUS

## 2016-01-26 MED ORDER — SODIUM CHLORIDE 0.9 % IV SOLN: INTRAVENOUS | Status: DC

## 2016-01-26 MED ORDER — MIDAZOLAM HCL 2 MG/2ML IJ SOLN
INTRAMUSCULAR | Status: AC | PRN
Start: 2016-01-26 — End: 2016-01-26
  Administered 2016-01-26: 1 mg via INTRAVENOUS

## 2016-01-26 MED ORDER — FENTANYL CITRATE (PF) 100 MCG/2ML IJ SOLN
INTRAMUSCULAR | Status: AC | PRN
  Administered 2016-01-26: 25 ug via INTRAVENOUS

## 2016-01-26 MED ORDER — FENTANYL CITRATE (PF) 100 MCG/2ML IJ SOLN
INTRAMUSCULAR | Status: AC
  Filled 2016-01-26: qty 2

## 2016-01-26 MED ORDER — MIDAZOLAM HCL 2 MG/2ML IJ SOLN
INTRAMUSCULAR | Status: AC
  Filled 2016-01-26: qty 2

## 2016-01-26 MED ORDER — SODIUM CHLORIDE 0.9 % IV SOLN: INTRAVENOUS | Status: AC

## 2016-01-26 MED ORDER — IBUPROFEN 600 MG PO TABS
600.0000 mg | ORAL_TABLET | Freq: Once | ORAL | Status: AC
  Administered 2016-01-26: 600 mg via ORAL
  Filled 2016-01-26: qty 1

## 2016-01-26 MED ORDER — LIDOCAINE HCL 1 % IJ SOLN
INTRAMUSCULAR | Status: AC
  Administered 2016-01-26: 10 mL
  Filled 2016-01-26: qty 20

## 2016-01-26 NOTE — Sedation Documentation (Signed)
Patient is resting comfortably. 

## 2016-01-26 NOTE — Sedation Documentation (Signed)
Patient denies pain and is resting comfortably.  

## 2016-01-26 NOTE — H&P (Signed)
Chief Complaint: history of aneurysm, follow up  Referring Physician: Dr. Luanne Bras  Supervising Physician: Luanne Bras  Patient Status:  Out-pt  HPI: [name] is an 45 y.o. female with a history of Suffered ruptured ACOM aneurysm in 01/2013 with coiling performed in 03/2013.  She then underwent LVIS stent over neck remnant placed 12/2013.  She has been feeling well recently with no new complaints.  She has had follow up MRIs which still show a small amount of flow to this aneurysm.  A follow up arteriogram has been ordered.   Past Medical History:  Past Medical History  Diagnosis Date  . PONV (postoperative nausea and vomiting)   . Hyperlipidemia     takes Atorvastatin daily  . Headache(784.0)   . SAH (subarachnoid hemorrhage) (HCC)     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  . Hypertension     takes Amlodipine and Propranolol daily  . Anxiety     takes Ativan daily as needed  . Depression     psychiatry  . Insomnia     takes Trazodone at bedtime  . GERD (gastroesophageal reflux disease)   . Arthritis     Past Surgical History:  Past Surgical History  Procedure Laterality Date  . Myomectomy  2009  . Laparotomy    . Myomectomy  05/16/2011    Procedure: MYOMECTOMY;  Surgeon: Margarette Asal;  Location: [facility];  Service: Gynecology;  Laterality: N/A;  abdominal  . Radiology with anesthesia N/A 02/18/2013    Procedure: RADIOLOGY WITH ANESTHESIA;  Surgeon: Rob Hickman, MD;  Location: [facility];  Service: Radiology;  Laterality: N/A;  . Brain surgery    . Radiology with anesthesia N/A 01/26/2014    Procedure: RADIOLOGY WITH ANESTHESIA;  Surgeon: Rob Hickman, MD;  Location: [facility];  Service: Radiology;  Laterality: N/A;  . Dilation and curettage of uterus  2009  . Radiology with anesthesia N/A 07/25/2014    Procedure: RADIOLOGY WITH ANESTHESIA;  Surgeon: Rob Hickman, MD;  Location: [facility];  Service: Radiology;  Laterality:  N/A;    Family History:  Family History  Problem Relation Age of Onset  . Heart disease Mother 76    cad  . Lupus Mother   . COPD Mother   . Arthritis Mother     rheumatoid  . AVM Mother   . Diabetes Mother   . Bipolar disorder Mother   . Drug abuse Mother   . Diabetes Father   . Cancer Father     thyroid  . Depression Sister   . Diabetes Sister   . Hyperlipidemia Sister   . Hypertension Sister   . Cancer Paternal Grandfather     colon  . Alcohol abuse Paternal Uncle   . Drug abuse Maternal Grandfather   . Alcohol abuse Maternal Grandfather     Social History:  reports that she has never smoked. She has never used smokeless tobacco. She reports that she drinks alcohol. She reports that she does not use illicit drugs.  Allergies:  Allergies  Allergen Reactions  . Ace Inhibitors Swelling and Other (See Comments)    Angioedema     Medications:   Medication List    ASK your doctor about these medications        amLODipine 10 MG tablet  Commonly known as:  NORVASC  Take 1 tablet (10 mg total) by mouth daily.     aspirin EC 325 MG tablet  Take 325  mg by mouth daily.     atorvastatin 40 MG tablet  Commonly known as:  LIPITOR  Take 1 tablet (40 mg total) by mouth daily.     clonazePAM 0.5 MG tablet  Commonly known as:  KLONOPIN  Take 0.5-1 mg by mouth 2 (two) times daily. 0.30m in the mornnig, 161min the evening     DULoxetine 60 MG capsule  Commonly known as:  CYMBALTA  Take 60 mg by mouth daily.     FISH OIL PO  Take 1 capsule by mouth daily.     ibuprofen 200 MG tablet  Commonly known as:  ADVIL,MOTRIN  Take 400-800 mg by mouth every 6 (six) hours as needed (for pain).     lithium carbonate 300 MG CR tablet  Commonly known as:  LITHOBID  Take 300 mg by mouth 2 (two) times daily.     multivitamin with minerals Tabs tablet  Take 1 tablet by mouth daily.     norgestimate-ethinyl estradiol 0.25-35 MG-MCG tablet  Commonly known as:   ORTHO-CYCLEN,SPRINTEC,PREVIFEM  Take 1 tablet by mouth daily.     propranolol ER 80 MG 24 hr capsule  Commonly known as:  INDERAL LA  Take 1 capsule (80 mg total) by mouth at bedtime.     zolpidem 12.5 MG CR tablet  Commonly known as:  AMBIEN CR  Take 12.5 mg by mouth at bedtime.        Please HPI for pertinent positives, otherwise complete 10 system ROS negative.  Mallampati Score: MD Evaluation Airway: WNL Heart: WNL Abdomen: WNL Chest/ Lungs: WNL ASA  Classification: 2 Mallampati/Airway Score: Two  Physical Exam: BP 116/83 mmHg  Temp(Src) 98 F (36.7 C) (Oral)  Resp 14  Ht '5\' 6"'  (1.676 m)  Wt 216 lb (97.977 kg)  BMI 34.88 kg/m2  SpO2 100%  LMP 01/25/2016 Body mass index is 34.88 kg/(m^2). General: pleasant, obese white female who is laying in bed in NAD HEENT: head is normocephalic, atraumatic.  Sclera are noninjected.  PERRL.  Ears and nose without any masses or lesions.  Mouth is pink and moist Heart: regular, rate, and rhythm.  Normal s1,s2. No obvious murmurs, gallops, or rubs noted.  Palpable radial and pedal pulses bilaterally Lungs: CTAB, no wheezes, rhonchi, or rales noted.  Respiratory effort nonlabored Abd: soft, NT, ND, +BS, no masses, hernias, or organomegaly MS: all 4 extremities are symmetrical with no cyanosis, clubbing, or edema. Psych: A&Ox3 with an appropriate affect.   Labs: Results for orders placed or performed during the hospital encounter of 01/26/16 (from the past 48 hour(s))  APTT     Status: None   Collection Time: 01/26/16  7:00 AM  Result Value Ref Range   aPTT 28 24 - 37 seconds  Basic metabolic panel     Status: Abnormal   Collection Time: 01/26/16  7:00 AM  Result Value Ref Range   Sodium 139 135 - 145 mmol/L   Potassium 3.9 3.5 - 5.1 mmol/L   Chloride 106 101 - 111 mmol/L   CO2 26 22 - 32 mmol/L   Glucose, Bld 117 (H) 65 - 99 mg/dL   BUN 9 6 - 20 mg/dL   Creatinine, Ser 0.92 0.44 - 1.00 mg/dL   Calcium 9.2 8.9 - 10.3 mg/dL    GFR calc non Af Amer >60 >60 mL/min   GFR calc Af Amer >60 >60 mL/min    Comment: (NOTE) The eGFR has been calculated using the CKD EPI equation. This calculation  has not been validated in all clinical situations. eGFR's persistently <60 mL/min signify possible Chronic Kidney Disease.    Anion gap 7 5 - 15  CBC     Status: None   Collection Time: 01/26/16  7:00 AM  Result Value Ref Range   WBC 8.0 4.0 - 10.5 K/uL   RBC 4.21 3.87 - 5.11 MIL/uL   Hemoglobin 12.8 12.0 - 15.0 g/dL   HCT 40.2 36.0 - 46.0 %   MCV 95.5 78.0 - 100.0 fL   MCH 30.4 26.0 - 34.0 pg   MCHC 31.8 30.0 - 36.0 g/dL   RDW 11.8 11.5 - 15.5 %   Platelets 300 150 - 400 K/uL  Protime-INR     Status: None   Collection Time: 01/26/16  7:00 AM  Result Value Ref Range   Prothrombin Time 12.2 11.6 - 15.2 seconds   INR 0.88 0.00 - 1.49  Pregnancy, urine     Status: None   Collection Time: 01/26/16  7:00 AM  Result Value Ref Range   Preg Test, Ur NEGATIVE NEGATIVE    Comment:        THE SENSITIVITY OF THIS METHODOLOGY IS >20 mIU/mL.     Imaging: No results found.  Assessment/Plan 1. S/p coiling of ACOM aneurysm in 2015 -will proceed with cerebral angiogram today to follow up on this area and to evaluate the other areas to make sure there are no new problems or issues. -she has been NPO and her labs and vitals have been reviewed. -Risks and Benefits discussed with the patient including, but not limited to bleeding, infection, vascular injury or contrast induced renal failure. All of the patient's questions were answered, patient is agreeable to proceed. Consent signed and in chart.   Thank you for this interesting consult.  I greatly enjoyed meeting [place] and look forward to participating in their care.  A copy of this report was sent to the requesting provider on this date.  Electronically Signed: Henreitta Cea 01/26/2016, 8:25 AM   I spent a total of    25 Minutes in face to face in  clinical consultation, greater than 50% of which was counseling/coordinating care for [facility] aneurysm

## 2016-01-26 NOTE — Procedures (Signed)
S/P 4 vessel cerebral arteriogram RT CFA approach. Findings. 1.Obliterated Acom aneurysm. 2.Small 1.42mm x 1.44mm neck remnant

## 2016-01-26 NOTE — Discharge Instructions (Signed)

## 2016-05-07 IMAGING — XA IR CARO CERE HEAD/NECK UNILAT LEFT (MS)
1 series · 14 of 24 positions shown · IV contrast (IODINE)
Comparison: none

CLINICAL DATA: Headaches. Previous history of rupture of anterior
communicating artery region aneurysm.

[Series 300: neuro · 14 of 49 slices shown]
[im 1/49]
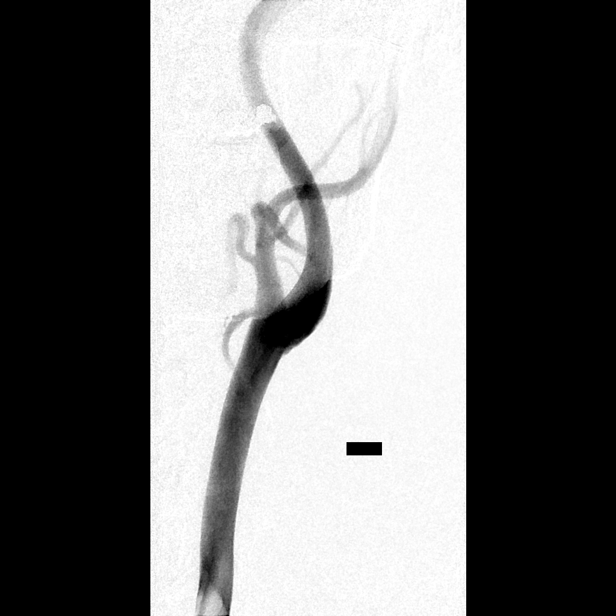
[im 5/49]
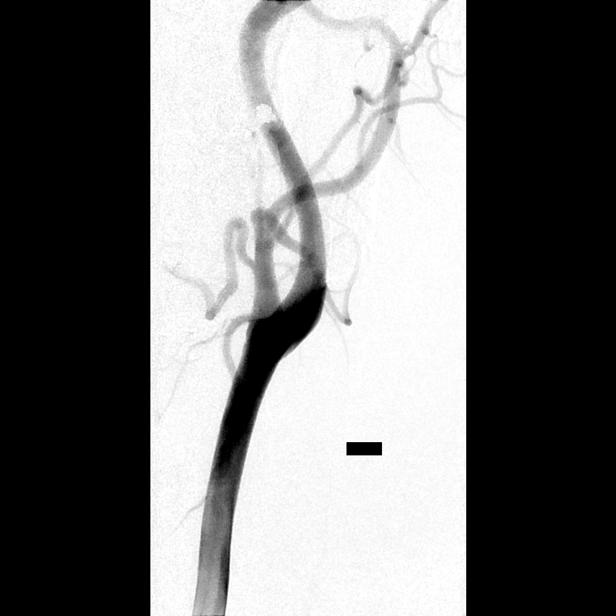
[im 9/49]
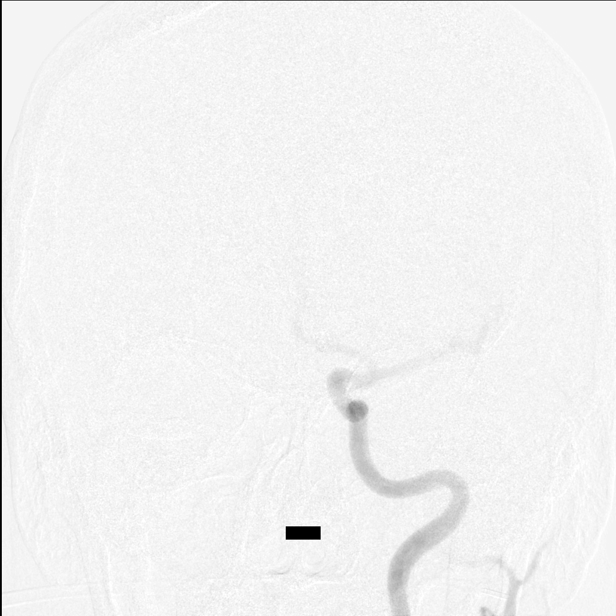
[im 13/49]
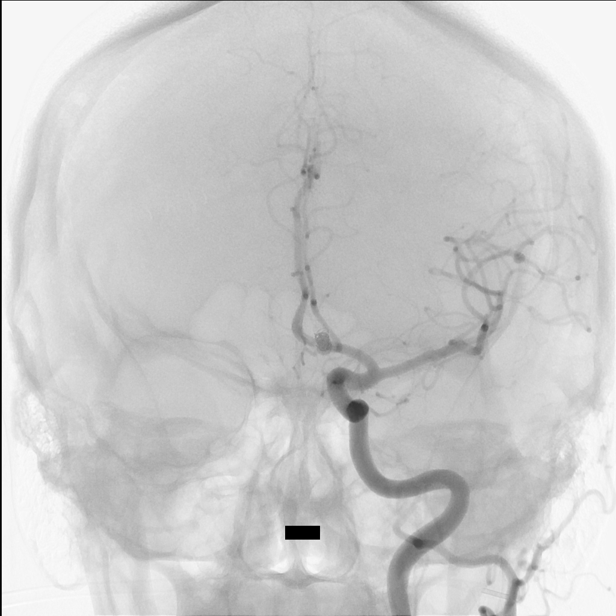
[im 15/49]
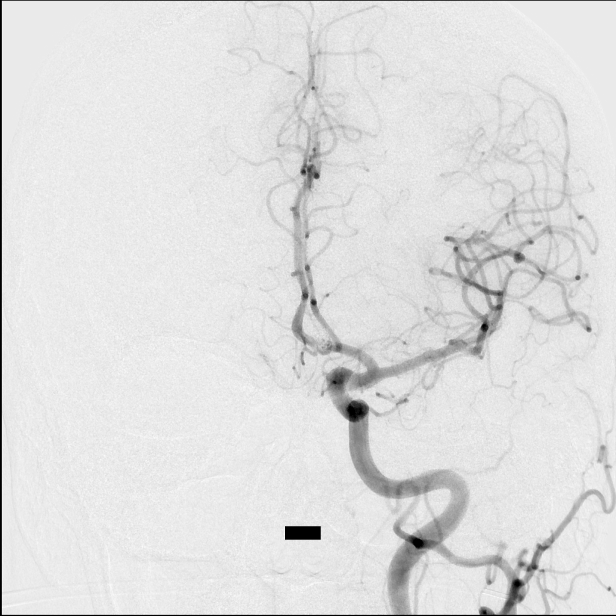
[im 19/49]
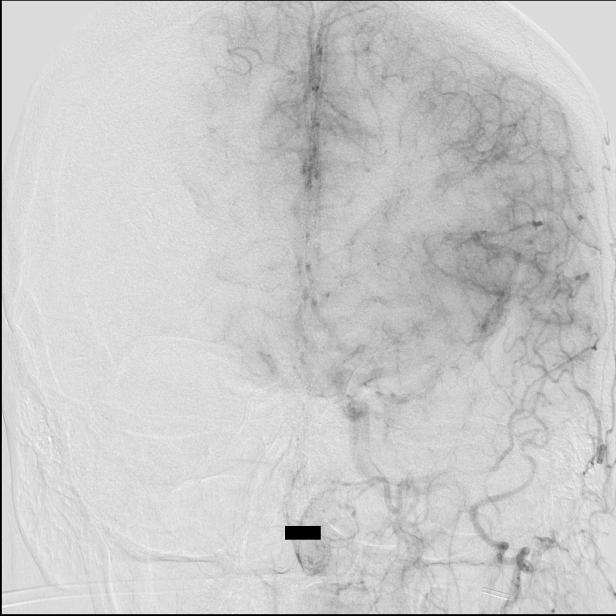
[im 23/49]
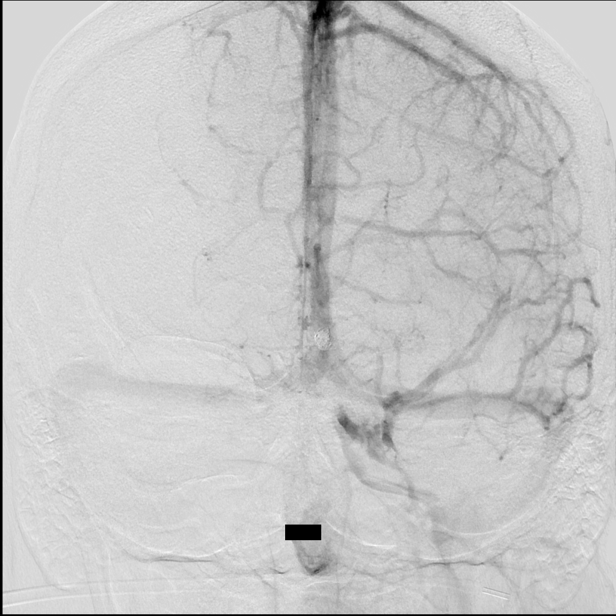
[im 26/49]
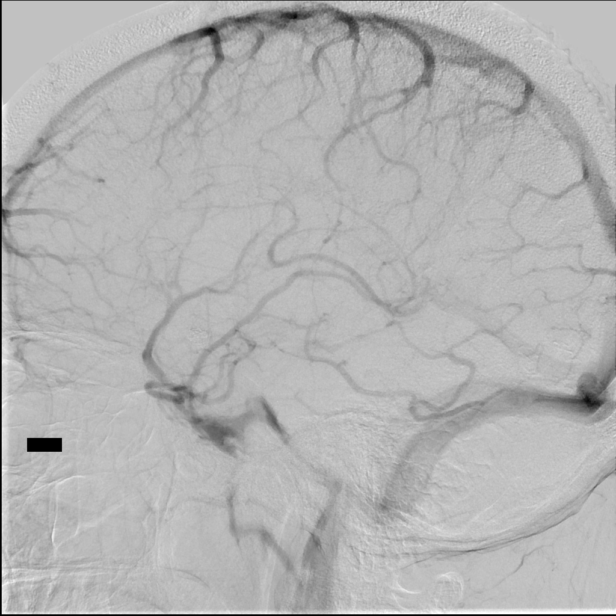
[im 30/49]
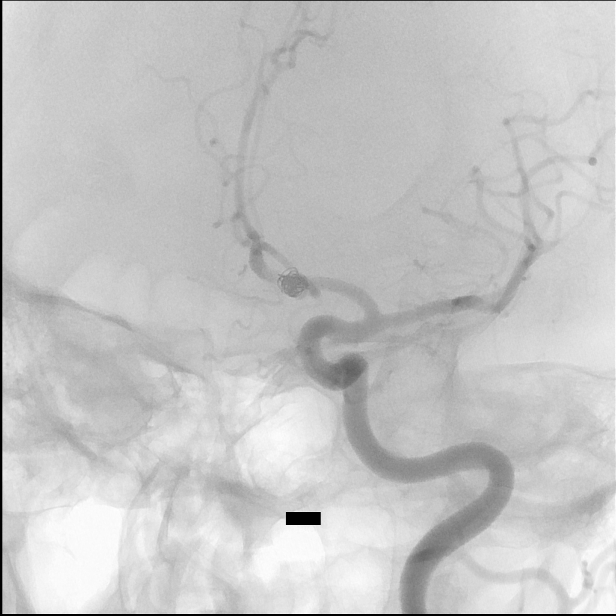
[im 34/49]
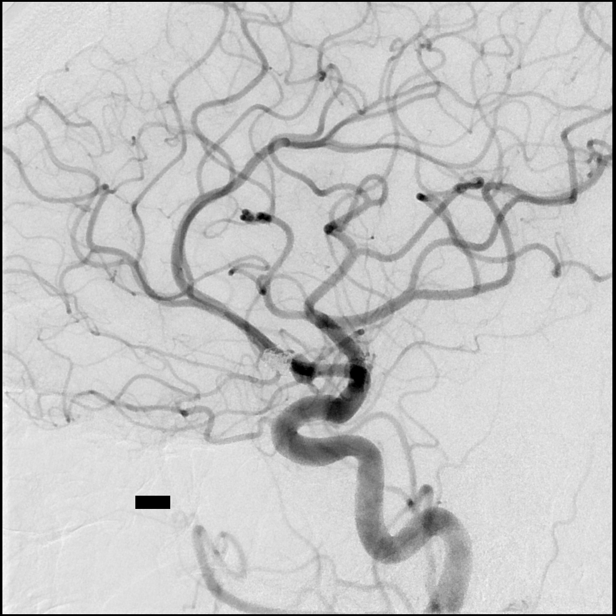
[im 38/49]
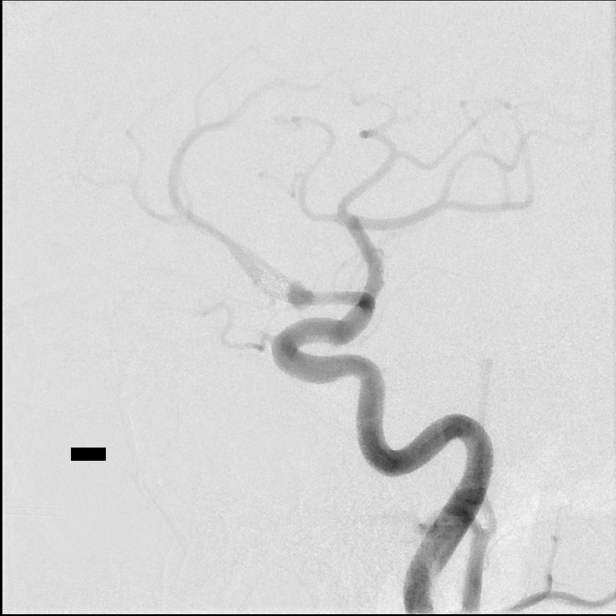
[im 40/49]
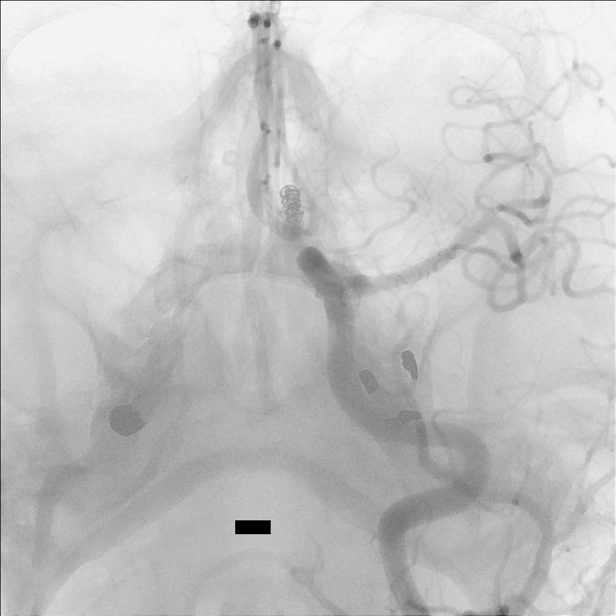
[im 44/49]
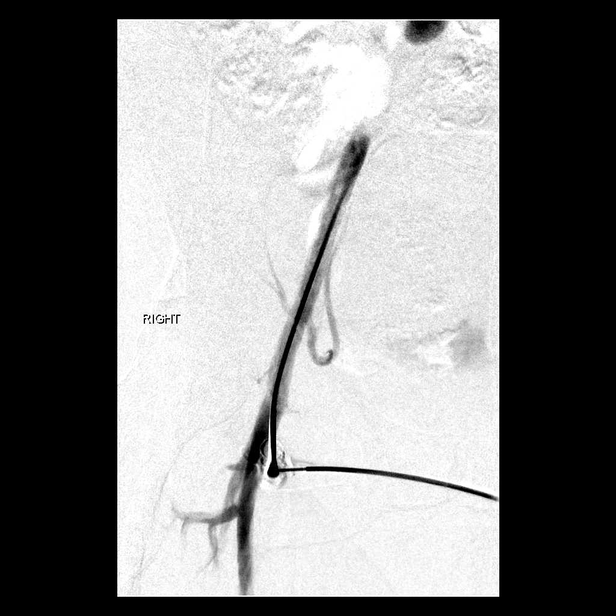
[im 49/49]
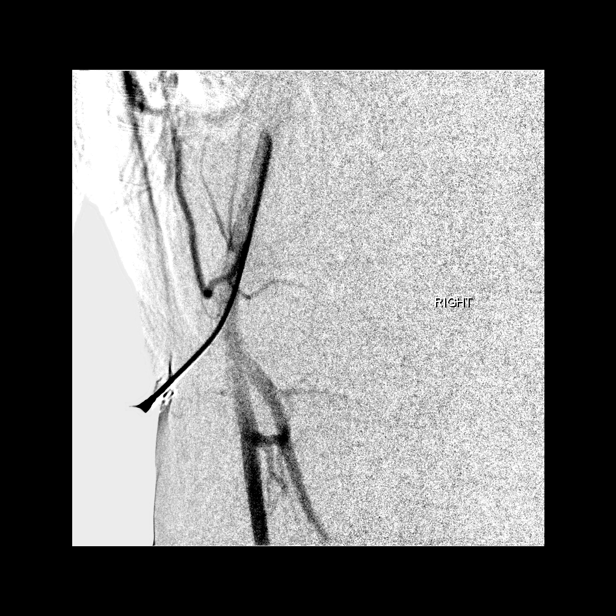

[14 of 24 positions shown; findings below may reference images not displayed]

EXAM:
IR ANGIO INTRA EXTRACRAN SEL COM CAROTID INNOMINATE UNI LEFT MOD SED

ANESTHESIA/SEDATION:
Conscious sedation.

MEDICATIONS:
Versed 1 mg IV.  Fentanyl 25 mcg IV.

CONTRAST:  25mL OMNIPAQUE IOHEXOL 300 MG/ML  SOLN

PROCEDURE:
Following a full explanation of the procedure along with the
potential associated complications, an informed witnessed consent
was obtained.

The right groin was prepped and draped in the usual sterile fashion.
Thereafter using modified Seldinger technique, transfemoral access
into the right common femoral artery was obtained without
difficulty. Over a 0.035 inch guidewire, a 5 French Pinnacle sheath
was inserted. Through this, and also over a 0.035 inch guidewire, a
5 French JB1 catheter was advanced to the aortic arch region and
selectively positioned in the left common carotid artery. An
arteriogram was then perfor[REDACTED]ed over the left common carotid
bifurcation and intracranially.

COMPLICATIONS:
None immediate
FINDINGS: The left common carotid arteriogram demonstrates the left external
carotid artery and its major branches to be normal.

The left internal carotid artery at the bulb to the cranial skull
base opacifies normally.

The petrous, the cavernous and the supraclinoid segments are widely
patent.

The left middle and the left anterior cerebral arteries opacify
normally into the capillary and the venous phases.

Cross filling of the right anterior cerebral artery via the anterior
communicating artery is seen. Again demonstrated is the previously
coiled anterior communicating artery region aneurysm without
evidence of coil compaction or recanalization. The neck remnant
measures 3.5 mm x 2.9 mm on today's examination.

No change in the morphology is seen otherwise. The previously
positioned stent across the neck of the aneurysm remains widely
patent.
IMPRESSION: Approximately 3.5 mm x 2.9 mm neck remnant at the base of the
previously endovascularly coiled anterior communicating artery
region aneurysm.

The angiographic findings were reviewed with the patient and
patient's sister. Given the fact that the neck remnant has not
changed for the past 5 months since the placement of the stent,
additional packing with coils within the neck remnant would be a
consideration to prevent potential future enlargement of the neck
remnant. Patient is agreeable to this.

Patient has been asked to wean herself off of the Plavix, and to
start taking aspirin 325 mg daily.

## 2016-07-01 ENCOUNTER — Other Ambulatory Visit: Payer: Self-pay | Admitting: *Deleted

## 2016-07-01 DIAGNOSIS — Z6836 Body mass index (BMI) 36.0-36.9, adult: Secondary | ICD-10-CM

## 2016-07-01 DIAGNOSIS — I10 Essential (primary) hypertension: Secondary | ICD-10-CM

## 2016-07-01 MED ORDER — ATORVASTATIN CALCIUM 40 MG PO TABS
40.0000 mg | ORAL_TABLET | Freq: Every day | ORAL | 0 refills | Status: DC
Start: 2016-07-01 — End: 2016-07-29

## 2016-07-01 MED ORDER — AMLODIPINE BESYLATE 10 MG PO TABS
10.0000 mg | ORAL_TABLET | Freq: Every day | ORAL | 0 refills | Status: DC
Start: 2016-07-01 — End: 2016-07-29

## 2016-07-23 ENCOUNTER — Other Ambulatory Visit (HOSPITAL_COMMUNITY): Payer: Self-pay | Admitting: Interventional Radiology

## 2016-07-23 DIAGNOSIS — I729 Aneurysm of unspecified site: Secondary | ICD-10-CM

## 2016-07-29 ENCOUNTER — Encounter: Payer: Self-pay | Admitting: Family Medicine

## 2016-07-29 ENCOUNTER — Ambulatory Visit (INDEPENDENT_AMBULATORY_CARE_PROVIDER_SITE_OTHER): Payer: Commercial Managed Care - PPO | Admitting: Family Medicine

## 2016-07-29 VITALS — BP 125/88 | HR 70 | Temp 98.6°F | Resp 20 | Wt 245.5 lb

## 2016-07-29 DIAGNOSIS — E785 Hyperlipidemia, unspecified: Secondary | ICD-10-CM | POA: Diagnosis not present

## 2016-07-29 DIAGNOSIS — I10 Essential (primary) hypertension: Secondary | ICD-10-CM | POA: Diagnosis not present

## 2016-07-29 DIAGNOSIS — E559 Vitamin D deficiency, unspecified: Secondary | ICD-10-CM | POA: Diagnosis not present

## 2016-07-29 DIAGNOSIS — Z6836 Body mass index (BMI) 36.0-36.9, adult: Secondary | ICD-10-CM | POA: Diagnosis not present

## 2016-07-29 DIAGNOSIS — E538 Deficiency of other specified B group vitamins: Secondary | ICD-10-CM

## 2016-07-29 MED ORDER — ATORVASTATIN CALCIUM 40 MG PO TABS: 40.0000 mg | ORAL_TABLET | Freq: Every day | ORAL | 1 refills | Status: DC

## 2016-07-29 MED ORDER — AMLODIPINE BESYLATE 10 MG PO TABS: 10.0000 mg | ORAL_TABLET | Freq: Every day | ORAL | 1 refills | Status: DC

## 2016-07-29 MED ORDER — PROPRANOLOL HCL ER 80 MG PO CP24
80.0000 mg | ORAL_CAPSULE | Freq: Every day | ORAL | 1 refills | Status: DC
Start: 2016-07-29 — End: 2017-01-22

## 2016-07-29 NOTE — Progress Notes (Signed)
Patient ID: [name], female   DOB: Oct 19, 1970, 45 y.o.   MRN: [id number]    [name] , [birth year], 45 y.o., female MRN: [id number]  CC: High blood pressure Subjective: Hypertension/hyperlipidemia: Patient is seen for follow up on her hypertension and hyperlipidemia. She reports compliance with her Inderal LA, Lipitor and amlodipine. She denies any chest pain, shortness of breath, dizziness, syncope or lower extremity edema.   Allergies  Allergen Reactions  . Ace Inhibitors Swelling and Other (See Comments)    Angioedema    Social History  Substance Use Topics  . Smoking status: Never Smoker  . Smokeless tobacco: Never Used  . Alcohol use Yes     Comment: wine and beer on occasion   Past Medical History:  Diagnosis Date  . Anxiety    takes Ativan daily as needed  . Arthritis   . Depression    psychiatry  . GERD (gastroesophageal reflux disease)   . Headache(784.0)   . Hyperlipidemia    takes Atorvastatin daily  . Hypertension    takes Amlodipine and Propranolol daily  . Insomnia    takes Trazodone at bedtime  . PONV (postoperative nausea and vomiting)   . 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 BRAIN SURGERY    . DILATION AND CURETTAGE OF UTERUS  2009  . LAPAROTOMY    . MYOMECTOMY  2009  . MYOMECTOMY  05/16/2011   Procedure: MYOMECTOMY;  Surgeon: Margarette Asal;  Location: [facility];  Service: Gynecology;  Laterality: N/A;  abdominal  . RADIOLOGY WITH ANESTHESIA N/A 02/18/2013   Procedure: RADIOLOGY WITH ANESTHESIA;  Surgeon: Rob Hickman, MD;  Location: [place];  Service: Radiology;  Laterality: N/A;  . RADIOLOGY WITH ANESTHESIA N/A 01/26/2014   Procedure: RADIOLOGY WITH ANESTHESIA;  Surgeon: Rob Hickman, MD;  Location: [place];  Service: Radiology;  Laterality: N/A;  . RADIOLOGY WITH ANESTHESIA N/A 07/25/2014   Procedure: RADIOLOGY  WITH ANESTHESIA;  Surgeon: Rob Hickman, MD;  Location: [place];  Service: Radiology;  Laterality: N/A;   Family History  Problem Relation Age of Onset  . Heart disease Mother 46    cad  . Lupus Mother   . COPD Mother   . Arthritis Mother     rheumatoid  . AVM Mother   . Diabetes Mother   . Bipolar disorder Mother   . Drug abuse Mother   . Diabetes Father   . Cancer Father     thyroid  . Depression Sister   . Diabetes Sister   . Hyperlipidemia Sister   . Hypertension Sister   . Cancer Paternal Grandfather     colon  . Alcohol abuse Paternal Uncle   . Drug abuse Maternal Grandfather   . Alcohol abuse Maternal Grandfather      Medication List       Accurate as of 07/29/16  1:11 PM. Always use your most recent med list.          amLODipine 10 MG tablet Commonly known as:  NORVASC Take 1 tablet (10 mg total) by mouth daily. Needs office visit prior to anymore refills.   aspirin EC 325 MG tablet Take 325 mg by mouth daily.   atorvastatin 40 MG tablet Commonly known as:  LIPITOR Take 1 tablet (40 mg total) by mouth daily. Needs office visit prior to anymore refills.   clonazePAM 0.5 MG  tablet Commonly known as:  KLONOPIN Take 0.5-1 mg by mouth 2 (two) times daily. 0.5mg  in the mornnig, 1mg  in the evening   DULoxetine 60 MG capsule Commonly known as:  CYMBALTA Take 60 mg by mouth daily.   FISH OIL PO Take 1 capsule by mouth daily.   ibuprofen 200 MG tablet Commonly known as:  ADVIL,MOTRIN Take 400-800 mg by mouth every 6 (six) hours as needed (for pain).   lithium carbonate 300 MG CR tablet Commonly known as:  LITHOBID Take 300 mg by mouth 2 (two) times daily.   multivitamin with minerals Tabs tablet Take 1 tablet by mouth daily.   norgestimate-ethinyl estradiol 0.25-35 MG-MCG tablet Commonly known as:  ORTHO-CYCLEN,SPRINTEC,PREVIFEM Take 1 tablet by mouth daily.   propranolol ER 80 MG 24 hr capsule Commonly known as:  INDERAL LA Take 1 capsule  (80 mg total) by mouth at bedtime.   zolpidem 12.5 MG CR tablet Commonly known as:  AMBIEN CR Take 12.5 mg by mouth at bedtime.      ROS: Negative, with the exception of above mentioned in HPI  Objective:  BP 125/88 (BP Location: Right Arm, Patient Position: Sitting, Cuff Size: Large)   Pulse 70   Temp 98.6 F (37 C)   Resp 20   Wt 245 lb 8 oz (111.4 kg)   SpO2 98%   BMI 39.62 kg/m  Body mass index is 39.62 kg/m. Gen: Afebrile. No acute distress. On toxic in appearance, well-developed, well-nourished, Caucasian female. Eyes:Pupils Equal Round Reactive to light, Extraocular movements intact,  Conjunctiva without redness, discharge or icterus. CV: RRR, no edema Chest: CTAB, no wheeze or crackles. Good air movement, normal resp effort.  Abd: Soft. NTND. BS present Neuro: Normal gait. PERLA. EOMi. Alert. Oriented x3   Assessment/Plan: [name] is a 45 y.o. female present for follow up OV Essential hypertension/hyperlipdemia: BMI 36.0-36.9,adult - stable today.  - will collect fasting labs with her CPE in January.   - Low salt diet, exercise > 150 minutes a week.  - refills on propranolol ER (INDERAL LA) 80 MG 24 hr capsule; Take 1 capsule (80 mg total) by mouth at bedtime.  Dispense: 90 capsule; Refill: 1 - refills on amLODipine (NORVASC) 10 MG tablet; Take 1 tablet (10 mg total) by mouth daily. Needs office visit prior to anymore refills.  Dispense: 90 tablet; Refill: 1 - refills on atorvastatin (LIPITOR) 40 MG tablet; Take 1 tablet (40 mg total) by mouth daily. Needs office visit prior to anymore refills.  Dispense: 90 tablet; Refill: 1     Future labs for CPE: CBC, CMP, TSH, A1C, Lipids placed today Rukiya Hodgkins, DO  [facility]     .

## 2016-07-29 NOTE — Patient Instructions (Signed)
CPE in January, fasting labs 2 days prior to provider appt.  I refilled blood pressure and cholesterol medications today.  - follow up on chronic medical conditions every 6 months.

## 2016-07-31 ENCOUNTER — Encounter (HOSPITAL_COMMUNITY): Payer: Self-pay

## 2016-07-31 ENCOUNTER — Ambulatory Visit (HOSPITAL_COMMUNITY): Payer: Commercial Managed Care - PPO

## 2016-07-31 ENCOUNTER — Ambulatory Visit (HOSPITAL_COMMUNITY)
Admission: RE | Admit: 2016-07-31 | Discharge: 2016-07-31 | Disposition: A | Discharge status: Home / Self Care | Payer: Commercial Managed Care - PPO | Source: Ambulatory Visit | Attending: Interventional Radiology | Admitting: Interventional Radiology

## 2016-07-31 DIAGNOSIS — I671 Cerebral aneurysm, nonruptured: Secondary | ICD-10-CM | POA: Insufficient documentation

## 2016-07-31 DIAGNOSIS — I729 Aneurysm of unspecified site: Secondary | ICD-10-CM

## 2016-07-31 LAB — CREATININE, SERUM
Creatinine, Ser: 0.91 mg/dL (ref 0.44–1.00)
GFR calc Af Amer: >60 mL/min (ref >60)
GFR calc non Af Amer: >60 mL/min (ref >60)

## 2016-07-31 MED ORDER — GADOBENATE DIMEGLUMINE 529 MG/ML IV SOLN
20.0000 mL | Freq: Once | INTRAVENOUS | Status: AC
  Administered 2016-07-31: 20 mL via INTRAVENOUS

## 2016-08-01 ENCOUNTER — Telehealth (HOSPITAL_COMMUNITY): Payer: Self-pay

## 2016-08-01 NOTE — Telephone Encounter (Signed)
Pt agreed to f/u in 6 months with mri/mra. AW  

## 2016-09-11 ENCOUNTER — Other Ambulatory Visit (INDEPENDENT_AMBULATORY_CARE_PROVIDER_SITE_OTHER): Payer: Commercial Managed Care - PPO

## 2016-09-11 DIAGNOSIS — Z6836 Body mass index (BMI) 36.0-36.9, adult: Secondary | ICD-10-CM

## 2016-09-11 DIAGNOSIS — E538 Deficiency of other specified B group vitamins: Secondary | ICD-10-CM

## 2016-09-11 DIAGNOSIS — E559 Vitamin D deficiency, unspecified: Secondary | ICD-10-CM

## 2016-09-11 DIAGNOSIS — E785 Hyperlipidemia, unspecified: Secondary | ICD-10-CM | POA: Diagnosis not present

## 2016-09-11 DIAGNOSIS — I10 Essential (primary) hypertension: Secondary | ICD-10-CM | POA: Diagnosis not present

## 2016-09-11 LAB — CBC WITH DIFFERENTIAL/PLATELET
Basophils Absolute: 0 10*3/uL (ref 0.0–0.1)
Basophils Relative: 0.2 % (ref 0.0–3.0)
Eosinophils Absolute: 0.2 10*3/uL (ref 0.0–0.7)
Eosinophils Relative: 1.7 % (ref 0.0–5.0)
HCT: 39.7 % (ref 36.0–46.0)
Hemoglobin: 13.3 g/dL (ref 12.0–15.0)
Lymphocytes Relative: 17.8 % (ref 12.0–46.0)
Lymphs Abs: 1.8 10*3/uL (ref 0.7–4.0)
MCHC: 33.6 g/dL (ref 30.0–36.0)
MCV: 92.8 fl (ref 78.0–100.0)
Monocytes Absolute: 0.4 10*3/uL (ref 0.1–1.0)
Monocytes Relative: 4.5 % (ref 3.0–12.0)
Neutro Abs: 7.5 10*3/uL (ref 1.4–7.7)
Neutrophils Relative %: 75.8 % (ref 43.0–77.0)
Platelets: 361 10*3/uL (ref 150.0–400.0)
RBC: 4.28 Mil/uL (ref 3.87–5.11)
RDW: 12.7 % (ref 11.5–15.5)
WBC: 9.9 10*3/uL (ref 4.0–10.5)

## 2016-09-11 LAB — COMPREHENSIVE METABOLIC PANEL
ALT: 21 U/L (ref 0–35)
AST: 18 U/L (ref 0–37)
Albumin: 4.1 g/dL (ref 3.5–5.2)
Alkaline Phosphatase: 71 U/L (ref 39–117)
BUN: 15 mg/dL (ref 6–23)
CO2: 27 mEq/L (ref 19–32)
Calcium: 9.7 mg/dL (ref 8.4–10.5)
Chloride: 103 mEq/L (ref 96–112)
Creatinine, Ser: 1 mg/dL (ref 0.40–1.20)
GFR: 64.29 mL/min (ref >60.00)
Glucose, Bld: 117 mg/dL — ABNORMAL HIGH (ref 70–99)
Potassium: 4.5 mEq/L (ref 3.5–5.1)
Sodium: 139 mEq/L (ref 135–145)
Total Bilirubin: 0.5 mg/dL (ref 0.2–1.2)
Total Protein: 6.9 g/dL (ref 6.0–8.3)

## 2016-09-11 LAB — VITAMIN D 25 HYDROXY (VIT D DEFICIENCY, FRACTURES): VITD: 79.42 ng/mL (ref 30.00–100.00)

## 2016-09-11 LAB — LIPID PANEL
Cholesterol: 208 mg/dL — ABNORMAL HIGH (ref 0–200)
HDL: 65.8 mg/dL (ref >39.00)
LDL Cholesterol: 106 mg/dL — ABNORMAL HIGH (ref 0–99)
NonHDL: 142.08
Total CHOL/HDL Ratio: 3
Triglycerides: 180 mg/dL — ABNORMAL HIGH (ref 0.0–149.0)
VLDL: 36 mg/dL (ref 0.0–40.0)

## 2016-09-11 LAB — VITAMIN B12: Vitamin B-12: 227 pg/mL (ref 211–911)

## 2016-09-11 LAB — TSH: TSH: 3.28 u[IU]/mL (ref 0.35–4.50)

## 2016-09-11 LAB — HEMOGLOBIN A1C: Hgb A1c MFr Bld: 5.3 % (ref 4.6–6.5)

## 2016-09-12 LAB — HM PAP SMEAR: HM Pap smear: NORMAL

## 2016-09-13 ENCOUNTER — Encounter: Payer: Self-pay | Admitting: Family Medicine

## 2016-09-13 ENCOUNTER — Ambulatory Visit (INDEPENDENT_AMBULATORY_CARE_PROVIDER_SITE_OTHER): Payer: Commercial Managed Care - PPO | Admitting: Family Medicine

## 2016-09-13 VITALS — BP 115/80 | HR 61 | Temp 98.4°F | Resp 20 | Ht 66.0 in | Wt 242.8 lb

## 2016-09-13 DIAGNOSIS — Z0000 Encounter for general adult medical examination without abnormal findings: Secondary | ICD-10-CM | POA: Diagnosis not present

## 2016-09-13 DIAGNOSIS — I10 Essential (primary) hypertension: Secondary | ICD-10-CM

## 2016-09-13 DIAGNOSIS — Z23 Encounter for immunization: Secondary | ICD-10-CM

## 2016-09-13 DIAGNOSIS — Z6839 Body mass index (BMI) 39.0-39.9, adult: Secondary | ICD-10-CM | POA: Diagnosis not present

## 2016-09-13 DIAGNOSIS — E785 Hyperlipidemia, unspecified: Secondary | ICD-10-CM | POA: Diagnosis not present

## 2016-09-13 DIAGNOSIS — E538 Deficiency of other specified B group vitamins: Secondary | ICD-10-CM | POA: Insufficient documentation

## 2016-09-13 DIAGNOSIS — I671 Cerebral aneurysm, nonruptured: Secondary | ICD-10-CM

## 2016-09-13 NOTE — Patient Instructions (Addendum)
Start B12 OTC supplement daily.  Fish oil increase to 2000-3000 mg daily.  It was great to see you today.    Health Maintenance, Female Introduction Adopting a healthy lifestyle and getting preventive care can go a long way to promote health and wellness. Talk with your health care provider about what schedule of regular examinations is right for you. This is a good chance for you to check in with your provider about disease prevention and staying healthy. In between checkups, there are plenty of things you can do on your own. Experts have done a lot of research about which lifestyle changes and preventive measures are most likely to keep you healthy. Ask your health care provider for more information. Weight and diet Eat a healthy diet  Be sure to include plenty of vegetables, fruits, low-fat dairy products, and lean protein.  Do not eat a lot of foods high in solid fats, added sugars, or salt.  Get regular exercise. This is one of the most important things you can do for your health.  Most adults should exercise for at least 150 minutes each week. The exercise should increase your heart rate and make you sweat (moderate-intensity exercise).  Most adults should also do strengthening exercises at least twice a week. This is in addition to the moderate-intensity exercise. Maintain a healthy weight  Body mass index (BMI) is a measurement that can be used to identify possible weight problems. It estimates body fat based on height and weight. Your health care provider can help determine your BMI and help you achieve or maintain a healthy weight.  For females 20 years of age and older:  A BMI below 18.5 is considered underweight.  A BMI of 18.5 to 24.9 is normal.  A BMI of 25 to 29.9 is considered overweight.  A BMI of 30 and above is considered obese. Watch levels of cholesterol and blood lipids  You should start having your blood tested for lipids and cholesterol at 44 years of age,  then have this test every 5 years.  You may need to have your cholesterol levels checked more often if:  Your lipid or cholesterol levels are high.  You are older than 44 years of age.  You are at high risk for heart disease. Cancer screening Lung Cancer  Lung cancer screening is recommended for adults 68-106 years old who are at high risk for lung cancer because of a history of smoking.  A yearly low-dose CT scan of the lungs is recommended for people who:  Currently smoke.  Have quit within the past 15 years.  Have at least a 30-pack-year history of smoking. A pack year is smoking an average of one pack of cigarettes a day for 1 year.  Yearly screening should continue until it has been 15 years since you quit.  Yearly screening should stop if you develop a health problem that would prevent you from having lung cancer treatment. Breast Cancer  Practice breast self-awareness. This means understanding how your breasts normally appear and feel.  It also means doing regular breast self-exams. Let your health care provider know about any changes, no matter how small.  If you are in your 20s or 30s, you should have a clinical breast exam (CBE) by a health care provider every 1-3 years as part of a regular health exam.  If you are 38 or older, have a CBE every year. Also consider having a breast X-ray (mammogram) every year.  If you have a  family history of breast cancer, talk to your health care provider about genetic screening.  If you are at high risk for breast cancer, talk to your health care provider about having an MRI and a mammogram every year.  Breast cancer gene (BRCA) assessment is recommended for women who have family members with BRCA-related cancers. BRCA-related cancers include:  Breast.  Ovarian.  Tubal.  Peritoneal cancers.  Results of the assessment will determine the need for genetic counseling and BRCA1 and BRCA2 testing. Cervical Cancer  Your health  care provider may recommend that you be screened regularly for cancer of the pelvic organs (ovaries, uterus, and vagina). This screening involves a pelvic examination, including checking for microscopic changes to the surface of your cervix (Pap test). You may be encouraged to have this screening done every 3 years, beginning at age 53.  For women ages 41-65, health care providers may recommend pelvic exams and Pap testing every 3 years, or they may recommend the Pap and pelvic exam, combined with testing for human papilloma virus (HPV), every 5 years. Some types of HPV increase your risk of cervical cancer. Testing for HPV may also be done on women of any age with unclear Pap test results.  Other health care providers may not recommend any screening for nonpregnant women who are considered low risk for pelvic cancer and who do not have symptoms. Ask your health care provider if a screening pelvic exam is right for you.  If you have had past treatment for cervical cancer or a condition that could lead to cancer, you need Pap tests and screening for cancer for at least 20 years after your treatment. If Pap tests have been discontinued, your risk factors (such as having a new sexual partner) need to be reassessed to determine if screening should resume. Some women have medical problems that increase the chance of getting cervical cancer. In these cases, your health care provider may recommend more frequent screening and Pap tests. Colorectal Cancer  This type of cancer can be detected and often prevented.  Routine colorectal cancer screening usually begins at 44 years of age and continues through 44 years of age.  Your health care provider may recommend screening at an earlier age if you have risk factors for colon cancer.  Your health care provider may also recommend using home test kits to check for hidden blood in the stool.  A small camera at the end of a tube can be used to examine your colon  directly (sigmoidoscopy or colonoscopy). This is done to check for the earliest forms of colorectal cancer.  Routine screening usually begins at age 31.  Direct examination of the colon should be repeated every 5-10 years through 44 years of age. However, you may need to be screened more often if early forms of precancerous polyps or small growths are found. Skin Cancer  Check your skin from head to toe regularly.  Tell your health care provider about any new moles or changes in moles, especially if there is a change in a mole's shape or color.  Also tell your health care provider if you have a mole that is larger than the size of a pencil eraser.  Always use sunscreen. Apply sunscreen liberally and repeatedly throughout the day.  Protect yourself by wearing long sleeves, pants, a wide-brimmed hat, and sunglasses whenever you are outside. Heart disease, diabetes, and high blood pressure  High blood pressure causes heart disease and increases the risk of stroke. High blood  pressure is more likely to develop in:  People who have blood pressure in the high end of the normal range (130-139/85-89 mm Hg).  People who are overweight or obese.  People who are African American.  If you are 33-37 years of age, have your blood pressure checked every 3-5 years. If you are 63 years of age or older, have your blood pressure checked every year. You should have your blood pressure measured twice-once when you are at a hospital or clinic, and once when you are not at a hospital or clinic. Record the average of the two measurements. To check your blood pressure when you are not at a hospital or clinic, you can use:  An automated blood pressure machine at a pharmacy.  A home blood pressure monitor.  If you are between 54 years and 24 years old, ask your health care provider if you should take aspirin to prevent strokes.  Have regular diabetes screenings. This involves taking a blood sample to check  your fasting blood sugar level.  If you are at a normal weight and have a low risk for diabetes, have this test once every three years after 44 years of age.  If you are overweight and have a high risk for diabetes, consider being tested at a younger age or more often. Preventing infection Hepatitis B  If you have a higher risk for hepatitis B, you should be screened for this virus. You are considered at high risk for hepatitis B if:  You were born in a country where hepatitis B is common. Ask your health care provider which countries are considered high risk.  Your parents were born in a high-risk country, and you have not been immunized against hepatitis B (hepatitis B vaccine).  You have HIV or AIDS.  You use needles to inject street drugs.  You live with someone who has hepatitis B.  You have had sex with someone who has hepatitis B.  You get hemodialysis treatment.  You take certain medicines for conditions, including cancer, organ transplantation, and autoimmune conditions. Hepatitis C  Blood testing is recommended for:  Everyone born from [birth year] through 1963.  Anyone with known risk factors for hepatitis C. Sexually transmitted infections (STIs)  You should be screened for sexually transmitted infections (STIs) including gonorrhea and chlamydia if:  You are sexually active and are younger than 44 years of age.  You are older than 44 years of age and your health care provider tells you that you are at risk for this type of infection.  Your sexual activity has changed since you were last screened and you are at an increased risk for chlamydia or gonorrhea. Ask your health care provider if you are at risk.  If you do not have HIV, but are at risk, it may be recommended that you take a prescription medicine daily to prevent HIV infection. This is called pre-exposure prophylaxis (PrEP). You are considered at risk if:  You are sexually active and do not regularly use  condoms or know the HIV status of your partner(s).  You take drugs by injection.  You are sexually active with a partner who has HIV. Talk with your health care provider about whether you are at high risk of being infected with HIV. If you choose to begin PrEP, you should first be tested for HIV. You should then be tested every 3 months for as long as you are taking PrEP. Pregnancy  If you are premenopausal and you may  become pregnant, ask your health care provider about preconception counseling.  If you may become pregnant, take 400 to 800 micrograms (mcg) of folic acid every day.  If you want to prevent pregnancy, talk to your health care provider about birth control (contraception). Osteoporosis and menopause  Osteoporosis is a disease in which the bones lose minerals and strength with aging. This can result in serious bone fractures. Your risk for osteoporosis can be identified using a bone density scan.  If you are 37 years of age or older, or if you are at risk for osteoporosis and fractures, ask your health care provider if you should be screened.  Ask your health care provider whether you should take a calcium or vitamin D supplement to lower your risk for osteoporosis.  Menopause may have certain physical symptoms and risks.  Hormone replacement therapy may reduce some of these symptoms and risks. Talk to your health care provider about whether hormone replacement therapy is right for you. Follow these instructions at home:  Schedule regular health, dental, and eye exams.  Stay current with your immunizations.  Do not use any tobacco products including cigarettes, chewing tobacco, or electronic cigarettes.  If you are pregnant, do not drink alcohol.  If you are breastfeeding, limit how much and how often you drink alcohol.  Limit alcohol intake to no more than 1 drink per day for nonpregnant women. One drink equals 12 ounces of beer, 5 ounces of wine, or 1 ounces of  hard liquor.  Do not use street drugs.  Do not share needles.  Ask your health care provider for help if you need support or information about quitting drugs.  Tell your health care provider if you often feel depressed.  Tell your health care provider if you have ever been abused or do not feel safe at home. This information is not intended to replace advice given to you by your health care provider. Make sure you discuss any questions you have with your health care provider. Document Released: 03/04/2011 Document Revised: 01/25/2016 Document Reviewed: 05/23/2015  2017 [employer]  Please help [place] help you:  It is a privilege to be able to take care of great patients such as yourself. We are honored you have chosen [facility] for your [facility] home. Below you will find basic instructions that you may need to access in the future. Please help [place] help you by reading the instructions, which cover many of the frequent questions we experience.   Prescription refills and request:  -In order to allow more efficient response time, please call your pharmacy for all refills. They will forward the request electronically to [place]. This allows for the quickest possible response. Request left on a nurse line can take longer to refill, since these are checked as time allows between office patients and other phone calls.  - refill request can take up to 3-5 working days to complete.  - If request is sent electronically and request is appropiate, it is usually completed in 1-2 business days.  - all patients will need to be seen routinely for all chronic medical conditions requiring prescription medications (see follow-up below). If you are overdue for follow up on your condition, you will be asked to make an appointment and we will call in enough medication to cover you until your appointment (up to 30 days).  - all controlled substances will require a face to face visit to request/refill.  - if you  desire your prescriptions to  go through a new pharmacy, and have an active script at original pharmacy, you will need to call your pharmacy and have scripts transferred to new pharmacy. This is completed between the pharmacy locations and not by your provider.    Results: If any images or labs were ordered, it can take up to 1 week to get results depending on the test ordered and the lab/facility running and resulting the test. - Normal or stable results, which do not need further discussion, will be released to your mychart immediately with attached note to you. A call will not be generated for normal results. Please make certain to sign up for mychart. If you have questions on how to activate your mychart you can call the front office.  - If your results need further discussion, our office will attempt to contact you via phone, and if unable to reach you after 2 attempts, we will release your abnormal result to your mychart with instructions.  - All results will be automatically released in mychart after 1 week.  - Your provider will provide you with explanation and instruction on all relevant material in your results. Please keep in mind, results and labs may appear confusing or abnormal to the untrained eye, but it does not mean they are actually abnormal for you personally. If you have any questions about your results that are not covered, or you desire more detailed explanation than what was provided, you should make an appointment with your provider to do so.   Our office handles many outgoing and incoming calls daily. If we have not contacted you within 1 week about your results, please check your mychart to see if there is a message first and if not, then contact our office.  In helping with this matter, you help decrease call volume, and therefore allow [place] to be able to respond to patients needs more efficiently.   Acute office visits (sick visit):  An acute visit is intended for a new problem  and are scheduled in shorter time slots to allow schedule openings for patients with new problems. This is the appropriate visit to discuss a new problem. In order to provide you with excellent quality medical care with proper time for you to explain your problem, have an exam and receive treatment with instructions, these appointments should be limited to one new problem per visit. If you experience a new problem, in which you desire to be addressed, please make an acute office visit, we save openings on the schedule to accommodate you. Please do not save your new problem for any other type of visit, let us take care of it properly and quickly for you.   Follow up visits:  Depending on your condition(s) your provider will need to see you routinely in order to provide you with quality care and prescribe medication(s). Most chronic conditions (Example: hypertension, Diabetes, depression/anxiety... etc), require visits a couple times a year. Your provider will instruct you on proper follow up for your personal medical conditions and history. Please make certain to make follow up appointments for your condition as instructed. Failing to do so could result in lapse in your medication treatment/refills. If you request a refill, and are overdue to be seen on a condition, we will always provide you with a 30 day script (once) to allow you time to schedule.    Medicare wellness (well visit): - we have a wonderful Nurse Maudie Mercury), that will meet with you and provide you will yearly medicare  wellness visits. These visits should occur yearly (can not be scheduled less than 1 calendar year apart) and cover preventive health, immunizations, advance directives and screenings you are entitled to yearly through your medicare benefits. Do not miss out on your entitled benefits, this is when medicare will pay for these benefits to be ordered for you.  These are strongly encouraged by your provider and is the appropriate type of  visit to make certain you are up to date with all preventive health benefits. If you have not had your medicare wellness exam in the last 12 months, please make certain to schedule one by calling the office and schedule your medicare wellness with Maudie Mercury as soon as possible.   Yearly physical (well visit):  - Adults are recommended to be seen yearly for physicals. Check with your insurance and date of your last physical, most insurances require one calendar year between physicals. Physicals include all preventive health topics, screenings, medical exam and labs that are appropriate for gender/age and history. You may have fasting labs needed at this visit. This is a well visit (not a sick visit), acute topics should not be covered during this visit.  - Pediatric patients are seen more frequently when they are younger. Your provider will advise you on well child visit timing that is appropriate for your their age. - This is not a medicare wellness visit. Medicare wellness exams do not have an exam portion to the visit. Some medicare companies allow for a physical, some do not allow a yearly physical. If your medicare allows a yearly physical you can schedule the medicare wellness with our nurse Maudie Mercury and have your physical with your provider after, on the same day. Please check with insurance for your full benefits.   Late Policy/No Shows:  - all new patients should arrive 15-30 minutes earlier than appointment to allow [place] time  to  obtain all personal demographics,  insurance information and for you to complete office paperwork. - All established patients should arrive 10-15 minutes earlier than appointment time to update all information and be checked in .  - In our best efforts to run on time, if you are late for your appointment you will be asked to either reschedule or if able, we will work you back into the schedule. There will be a wait time to work you back in the schedule,  depending on availability.   - If you are unable to make it to your appointment as scheduled, please call 24 hours ahead of time to allow [place] to fill the time slot with someone else who needs to be seen. If you do not cancel your appointment ahead of time, you may be charged a no show fee.

## 2016-09-13 NOTE — Progress Notes (Signed)
Patient ID: [name], female  DOB: [birth year], 46 y.o.   MRN: [id number] Patient Care Team    Relationship Specialty Notifications Start End  Ma Hillock, DO PCP - General Family Medicine  05/01/15   Sheralyn Boatman, MD Consulting Physician Psychiatry  11/27/15   Dan Humphreys  Nurse Practitioner  12/26/15     Subjective:  [name] is a 46 y.o.  Female  present for CPE. All past medical history, surgical history, allergies, family history, immunizations, medications and social history were updated in the electronic medical record today. All recent labs, ED visits and hospitalizations within the last year were reviewed.  Health maintenance:  Colonoscopy: PGF colon cancer history (82). Screen at 52.  Mammogram: 04/2016, normal, has completed at OB/GYN office.  Cervical cancer screening: 09/12/2016 Immunizations: tdap 2007 (due), Influenza 06/2016 (encouraged yearly) Infectious disease screening: HIV completed  DEXA: N/A Assistive device: none Oxygen BMS:XJDB Patient has a Dental home. Hospitalizations/ED visits: none  Immunization History  Administered Date(s) Administered  . Influenza Split 05/18/2011  . Influenza,inj,Quad PF,36+ Mos 07/09/2013, 05/04/2014, 05/11/2015  . Td 09/02/2005     Past Medical History:  Diagnosis Date  . Anxiety    takes Ativan daily as needed  . Arthritis   . Depression    psychiatry  . GERD (gastroesophageal reflux disease)   . Headache(784.0)   . Hyperlipidemia    takes Atorvastatin daily  . Hypertension    takes Amlodipine and Propranolol daily  . Insomnia    takes Trazodone at bedtime  . PONV (postoperative nausea and vomiting)   . 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 Ace Inhibitors Swelling and Other (See Comments)    Angioedema    Past Surgical History:  Procedure Laterality Date  . BRAIN SURGERY    . DILATION AND  CURETTAGE OF UTERUS  2009  . LAPAROTOMY    . MYOMECTOMY  2009  . MYOMECTOMY  05/16/2011   Procedure: MYOMECTOMY;  Surgeon: Margarette Asal;  Location: [facility];  Service: Gynecology;  Laterality: N/A;  abdominal  . RADIOLOGY WITH ANESTHESIA N/A 02/18/2013   Procedure: RADIOLOGY WITH ANESTHESIA;  Surgeon: Rob Hickman, MD;  Location: [facility];  Service: Radiology;  Laterality: N/A;  . RADIOLOGY WITH ANESTHESIA N/A 01/26/2014   Procedure: RADIOLOGY WITH ANESTHESIA;  Surgeon: Rob Hickman, MD;  Location: [place] [facility];  Service: Radiology;  Laterality: N/A;  . RADIOLOGY WITH ANESTHESIA N/A 07/25/2014   Procedure: RADIOLOGY WITH ANESTHESIA;  Surgeon: Rob Hickman, MD;  Location: [facility];  Service: Radiology;  Laterality: N/A;   Family History  Problem Relation Age of Onset  . Heart disease Mother 75    cad  . Lupus Mother   . COPD Mother   . Arthritis Mother     rheumatoid  . AVM Mother   . Diabetes Mother   . Bipolar disorder Mother   . Drug abuse Mother   . Diabetes Father   . Cancer Father     thyroid  . Depression Sister   . Diabetes Sister   . Hyperlipidemia Sister   . Hypertension Sister   . Cancer Paternal Grandfather     colon  . Alcohol abuse Paternal Uncle   . Drug abuse Maternal Grandfather   . Alcohol abuse Maternal Grandfather    Social History   Social History  . Marital status: Married  Spouse name: N/A  . Number of children: 0  . Years of education: N/A   Occupational History  .  [employer]   Social History Main Topics  . Smoking status: Never Smoker  . Smokeless tobacco: Never Used  . Alcohol use Yes     Comment: wine and beer on occasion  . Drug use: No  . Sexual activity: Yes    Birth control/ protection: Pill   Other Topics Concern  . Not on file   Social History Narrative  . No narrative on file   Allergies as of 09/13/2016      Reactions   Ace Inhibitors Swelling, Other (See Comments)   Angioedema       Medication  List       Accurate as of 09/13/16  1:48 PM. Always use your most recent med list.          amLODipine 10 MG tablet Commonly known as:  NORVASC Take 1 tablet (10 mg total) by mouth daily. Needs office visit prior to anymore refills.   aspirin EC 325 MG tablet Take 325 mg by mouth daily.   atorvastatin 40 MG tablet Commonly known as:  LIPITOR Take 1 tablet (40 mg total) by mouth daily. Needs office visit prior to anymore refills.   clonazePAM 0.5 MG tablet Commonly known as:  KLONOPIN Take 0.5-1 mg by mouth 2 (two) times daily. 0.25m in the mornnig, 163min the evening   DULoxetine 30 MG capsule Commonly known as:  CYMBALTA Take 90 mg by mouth daily.   FISH OIL PO Take 1 capsule by mouth daily.   lithium carbonate 300 MG CR tablet Commonly known as:  LITHOBID Take 300 mg by mouth 2 (two) times daily.   multivitamin with minerals Tabs tablet Take 1 tablet by mouth daily.   norgestimate-ethinyl estradiol 0.25-35 MG-MCG tablet Commonly known as:  ORTHO-CYCLEN,SPRINTEC,PREVIFEM Take 1 tablet by mouth daily.   propranolol ER 80 MG 24 hr capsule Commonly known as:  INDERAL LA Take 1 capsule (80 mg total) by mouth at bedtime.   zolpidem 12.5 MG CR tablet Commonly known as:  AMBIEN CR Take 12.5 mg by mouth at bedtime.        Recent Results (from the past 2160 hour(s))  Creatinine, serum     Status: None   Collection Time: 07/31/16  2:00 PM  Result Value Ref Range   Creatinine, Ser 0.91 0.44 - 1.00 mg/dL   GFR calc non Af Amer >60 >60 mL/min   GFR calc Af Amer >60 >60 mL/min    Comment: (NOTE) The eGFR has been calculated using the CKD EPI equation. This calculation has not been validated in all clinical situations. eGFR's persistently <60 mL/min signify possible Chronic Kidney Disease.   CBC w/Diff     Status: None   Collection Time: 09/11/16  8:03 AM  Result Value Ref Range   WBC 9.9 4.0 - 10.5 K/uL   RBC 4.28 3.87 - 5.11 Mil/uL   Hemoglobin 13.3 12.0 -  15.0 g/dL   HCT 39.7 36.0 - 46.0 %   MCV 92.8 78.0 - 100.0 fl   MCHC 33.6 30.0 - 36.0 g/dL   RDW 12.7 11.5 - 15.5 %   Platelets 361.0 150.0 - 400.0 K/uL   Neutrophils Relative % 75.8 43.0 - 77.0 %   Lymphocytes Relative 17.8 12.0 - 46.0 %   Monocytes Relative 4.5 3.0 - 12.0 %   Eosinophils Relative 1.7 0.0 - 5.0 %  Basophils Relative 0.2 0.0 - 3.0 %   Neutro Abs 7.5 1.4 - 7.7 K/uL   Lymphs Abs 1.8 0.7 - 4.0 K/uL   Monocytes Absolute 0.4 0.1 - 1.0 K/uL   Eosinophils Absolute 0.2 0.0 - 0.7 K/uL   Basophils Absolute 0.0 0.0 - 0.1 K/uL  Comp Met (CMET)     Status: Abnormal   Collection Time: 09/11/16  8:03 AM  Result Value Ref Range   Sodium 139 135 - 145 mEq/L   Potassium 4.5 3.5 - 5.1 mEq/L   Chloride 103 96 - 112 mEq/L   CO2 27 19 - 32 mEq/L   Glucose, Bld 117 (H) 70 - 99 mg/dL   BUN 15 6 - 23 mg/dL   Creatinine, Ser 1.00 0.40 - 1.20 mg/dL   Total Bilirubin 0.5 0.2 - 1.2 mg/dL   Alkaline Phosphatase 71 39 - 117 U/L   AST 18 0 - 37 U/L   ALT 21 0 - 35 U/L   Total Protein 6.9 6.0 - 8.3 g/dL   Albumin 4.1 3.5 - 5.2 g/dL   Calcium 9.7 8.4 - 10.5 mg/dL   GFR 64.29 >60.00 mL/min  TSH     Status: None   Collection Time: 09/11/16  8:03 AM  Result Value Ref Range   TSH 3.28 0.35 - 4.50 uIU/mL  HgB A1c     Status: None   Collection Time: 09/11/16  8:03 AM  Result Value Ref Range   Hgb A1c MFr Bld 5.3 4.6 - 6.5 %    Comment: Glycemic Control Guidelines for People with Diabetes:Non Diabetic:  <6%Goal of Therapy: <7%Additional Action Suggested:  >8%   Lipid panel     Status: Abnormal   Collection Time: 09/11/16  8:03 AM  Result Value Ref Range   Cholesterol 208 (H) 0 - 200 mg/dL    Comment: ATP III Classification       Desirable:  < 200 mg/dL               Borderline High:  200 - 239 mg/dL          High:  > = 240 mg/dL   Triglycerides 180.0 (H) 0.0 - 149.0 mg/dL    Comment: Normal:  <150 mg/dLBorderline High:  150 - 199 mg/dL   HDL 65.80 >39.00 mg/dL   VLDL 36.0 0.0 - 40.0 mg/dL    LDL Cholesterol 106 (H) 0 - 99 mg/dL   Total CHOL/HDL Ratio 3     Comment:                Men          Women1/2 Average Risk     3.4          3.3Average Risk          5.0          4.42X Average Risk          9.6          7.13X Average Risk          15.0          11.0                       NonHDL 142.08     Comment: NOTE:  Non-HDL goal should be 30 mg/dL higher than patient's LDL goal (i.e. LDL goal of < 70 mg/dL, would have non-HDL goal of < 100 mg/dL)  Vitamin D (25 hydroxy)  Status: None   Collection Time: 09/11/16  8:03 AM  Result Value Ref Range   VITD 79.42 30.00 - 100.00 ng/mL  B12     Status: None   Collection Time: 09/11/16  8:03 AM  Result Value Ref Range   Vitamin B-12 227 211 - 911 pg/mL     ROS: 14 pt review of systems performed and negative (unless mentioned in an HPI)  Objective: BP 115/80 (BP Location: Right Arm, Patient Position: Sitting, Cuff Size: Large)   Pulse 61   Temp 98.4 F (36.9 C)   Resp 20   Ht _0  (1.676 m)   Wt 242 lb 12 oz (110.1 kg)   LMP 09/03/2016   SpO2 98%   BMI 39.18 kg/m  Gen: Afebrile. No acute distress. Nontoxic in appearance, well-developed, well-nourished,  Obese, caucasian female.  HENT: AT. [place]. Bilateral TM visualized and normal in appearance, normal external auditory canal. MMM, no oral lesions, adequate dentition. Bilateral nares within normal limits. Throat without erythema, ulcerations or exudates. no Cough on exam, no hoarseness on exam. Eyes:Pupils Equal Round Reactive to light, Extraocular movements intact,  Conjunctiva without redness, discharge or icterus. Neck/lymp/endocrine: Supple,no lymphadenopathy, no thyromegaly CV: RRR, no edema, +2/4 P posterior tibialis pulses. no carotid bruits. No JVD. Chest: CTAB, no wheeze, rhonchi or crackles. Normal  Respiratory effort. good Air movement. Abd: Soft. obese. NTND. BS present. no Masses palpated. No hepatosplenomegaly. No rebound tenderness or guarding. Skin: no rashes,  purpura or petechiae. Warm and well-perfused. Skin intact. Neuro/Msk:  Normal gait. PERLA. EOMi. Alert. Oriented x3.  Cranial nerves II through XII intact. Muscle strength 5/5 upper/lower extremity. DTRs equal bilaterally. Psych: Normal affect, dress and demeanor. Normal speech. Normal thought content and judgment.   Assessment/plan: [name] is a 46 y.o. female present for CPE.  Encounter for preventive health examination Patient was encouraged to exercise greater than 150 minutes a week. Patient was encouraged to choose a diet filled with fresh fruits and vegetables, and lean meats. AVS provided to patient today for education/recommendation on gender specific health and safety maintenance. Colonoscopy: PGF colon cancer history (82). Screen at 52.  Mammogram: 04/2016, normal, has completed at OB/GYN office.  Cervical cancer screening: 09/12/2016 Immunizations: tdap administered today, Influenza 06/2016 (encouraged yearly) Infectious disease screening: HIV completed   BMI 39.0-39.9,adul - exercise > 150 minutes a week.  Hyperlipidemia, unspecified hyperlipidemia type Continue statin, asa Increase fish oil to 2-4 g daily.  Essential hypertension - stable today. Continue propranolol, norvasc - Low salt diet.  Anterior cerebral artery aneurysm Continue routine f/u with neuro/IR B12 deficiency Restart daily otc b12 supplement.    Return in about 6 months (around 03/13/2017), or HTN.  Electronically signed by: Howard Pouch, DO [place]

## 2016-12-31 ENCOUNTER — Telehealth: Payer: Self-pay

## 2016-12-31 ENCOUNTER — Encounter: Payer: Self-pay | Admitting: Family Medicine

## 2016-12-31 ENCOUNTER — Ambulatory Visit (INDEPENDENT_AMBULATORY_CARE_PROVIDER_SITE_OTHER): Payer: Commercial Managed Care - PPO | Admitting: Family Medicine

## 2016-12-31 VITALS — BP 122/86 | HR 62 | Temp 98.2°F | Resp 20 | Wt 245.5 lb

## 2016-12-31 DIAGNOSIS — R600 Localized edema: Secondary | ICD-10-CM

## 2016-12-31 LAB — BASIC METABOLIC PANEL WITH GFR
BUN: 15 mg/dL (ref 7–25)
CO2: 25 mmol/L (ref 20–31)
Calcium: 9.6 mg/dL (ref 8.6–10.2)
Chloride: 100 mmol/L (ref 98–110)
Creat: 1.01 mg/dL (ref 0.50–1.10)
GFR, Est African American: 79 mL/min (ref >=60)
GFR, Est Non African American: 68 mL/min (ref >=60)
Glucose, Bld: 93 mg/dL (ref 65–99)
Potassium: 4.3 mmol/L (ref 3.5–5.3)
Sodium: 137 mmol/L (ref 135–146)

## 2016-12-31 LAB — TSH: TSH: 2.85 u[IU]/mL (ref 0.35–4.50)

## 2016-12-31 MED ORDER — HYDROCHLOROTHIAZIDE 25 MG PO TABS: 12.5000 mg | ORAL_TABLET | Freq: Every day | ORAL | 1 refills | Status: DC

## 2016-12-31 NOTE — Telephone Encounter (Signed)
[name], from [place], [facility] called stating that there is an interaction with HCTZ and Lithium. Dr. Raoul Pitch notified and is aware. Patient to take the HCTZ as prescribed. Pharmacy notified.

## 2016-12-31 NOTE — Progress Notes (Signed)
[name] , Jan 05, 1971, 46 y.o., female MRN: [id number] Patient Care Team    Relationship Specialty Notifications Start End  Ma Hillock, DO PCP - General Family Medicine  05/01/15   Sheralyn Boatman, MD Consulting Physician Psychiatry  11/27/15   Dan Humphreys  Nurse Practitioner  12/26/15   Molli Posey, MD Consulting Physician Obstetrics and Gynecology  09/13/16     Chief Complaint  Patient presents with  . Foot Swelling    several weeks     Subjective: Pt presents for an OV with complaints of bilateral feet swelling of 4 weeks duration.  Associated symptoms include worse in the evening. Shoes fitting tightly. She has started exercising. Dietary changes made, eating more salads with dressing. Almonds are go to snack. She tries to not add sodium. BP readings are normal at home. She has not had any new medications. She is on amlodipine 10 mg Qd and has been on medication for some time. She denies chest pain, shortness of breath.   Depression screen [facility] 2/9 09/13/2016 11/23/2015  Decreased Interest 0 3  Down, Depressed, Hopeless 1 3  PHQ - 2 Score 1 6  Altered sleeping - 3  Tired, decreased energy - 3  Change in appetite - 3  Feeling bad or failure about yourself  - 3  Trouble concentrating - 2  Moving slowly or fidgety/restless - 2  Suicidal thoughts - 1  PHQ-9 Score - 23  Difficult doing work/chores - Somewhat difficult  Some encounter information is confidential and restricted. Go to Review Flowsheets activity to see all data.    Allergies  Allergen Reactions  . Ace Inhibitors Swelling and Other (See Comments)    Angioedema    Social History  Substance Use Topics  . Smoking status: Never Smoker  . Smokeless tobacco: Never Used  . Alcohol use Yes     Comment: wine and beer on occasion   Past Medical History:  Diagnosis Date  . Anxiety    takes Ativan daily as needed  . Arthritis   . Depression    psychiatry  . GERD (gastroesophageal reflux  disease)   . Headache(784.0)   . Hyperlipidemia    takes Atorvastatin daily  . Hypertension    takes Amlodipine and Propranolol daily  . Insomnia    takes Trazodone at bedtime  . PONV (postoperative nausea and vomiting)   . 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 BRAIN SURGERY    . DILATION AND CURETTAGE OF UTERUS  2009  . LAPAROTOMY    . MYOMECTOMY  2009  . MYOMECTOMY  05/16/2011   Procedure: MYOMECTOMY;  Surgeon: Margarette Asal;  Location: [facility];  Service: Gynecology;  Laterality: N/A;  abdominal  . RADIOLOGY WITH ANESTHESIA N/A 02/18/2013   Procedure: RADIOLOGY WITH ANESTHESIA;  Surgeon: Rob Hickman, MD;  Location: [place];  Service: Radiology;  Laterality: N/A;  . RADIOLOGY WITH ANESTHESIA N/A 01/26/2014   Procedure: RADIOLOGY WITH ANESTHESIA;  Surgeon: Rob Hickman, MD;  Location: [place];  Service: Radiology;  Laterality: N/A;  . RADIOLOGY WITH ANESTHESIA N/A 07/25/2014   Procedure: RADIOLOGY WITH ANESTHESIA;  Surgeon: Rob Hickman, MD;  Location: [place];  Service: Radiology;  Laterality: N/A;   Family History  Problem Relation Age of Onset  . Heart disease Mother 54    cad  . Lupus Mother   .  COPD Mother   . Arthritis Mother     rheumatoid  . AVM Mother   . Diabetes Mother   . Bipolar disorder Mother   . Drug abuse Mother   . Diabetes Father   . Cancer Father     thyroid  . Depression Sister   . Diabetes Sister   . Hyperlipidemia Sister   . Hypertension Sister   . Cancer Paternal Grandfather     colon  . Alcohol abuse Paternal Uncle   . Drug abuse Maternal Grandfather   . Alcohol abuse Maternal Grandfather    Allergies as of 12/31/2016      Reactions   Ace Inhibitors Swelling, Other (See Comments)   Angioedema       Medication List       Accurate as of 12/31/16 11:25 AM. Always use your most recent med list.          amLODipine 10 MG  tablet Commonly known as:  NORVASC Take 1 tablet (10 mg total) by mouth daily. Needs office visit prior to anymore refills.   aspirin EC 325 MG tablet Take 325 mg by mouth daily.   atorvastatin 40 MG tablet Commonly known as:  LIPITOR Take 1 tablet (40 mg total) by mouth daily. Needs office visit prior to anymore refills.   clonazePAM 0.5 MG tablet Commonly known as:  KLONOPIN Take 0.5-1 mg by mouth 2 (two) times daily. 0.5mg  in the mornnig, 1mg  in the evening   DULoxetine 30 MG capsule Commonly known as:  CYMBALTA Take 90 mg by mouth daily.   FISH OIL PO Take 1 capsule by mouth daily.   lithium carbonate 300 MG CR tablet Commonly known as:  LITHOBID Take 300 mg by mouth 2 (two) times daily.   multivitamin with minerals Tabs tablet Take 1 tablet by mouth daily.   norgestimate-ethinyl estradiol 0.25-35 MG-MCG tablet Commonly known as:  ORTHO-CYCLEN,SPRINTEC,PREVIFEM Take 1 tablet by mouth daily.   propranolol ER 80 MG 24 hr capsule Commonly known as:  INDERAL LA Take 1 capsule (80 mg total) by mouth at bedtime.   zolpidem 12.5 MG CR tablet Commonly known as:  AMBIEN CR Take 12.5 mg by mouth at bedtime.       All past medical history, surgical history, allergies, family history, immunizations andmedications were updated in the EMR today and reviewed under the history and medication portions of their EMR.     ROS: Negative, with the exception of above mentioned in HPI   Objective:  BP 122/86 (BP Location: Right Arm, Patient Position: Sitting, Cuff Size: Large)   Pulse 62   Temp 98.2 F (36.8 C)   Resp 20   Wt 245 lb 8 oz (111.4 kg)   SpO2 97%   BMI 39.62 kg/m  Body mass index is 39.62 kg/m. Gen: Afebrile. No acute distress. Nontoxic in appearance, well developed, well nourished.  HENT: AT. [place].MMM Eyes:Pupils Equal Round Reactive to light, Extraocular movements intact,  Conjunctiva without redness, discharge or icterus. CV: RRR, trace pitting edema Chest:  CTAB, no wheeze or crackles. Skin: no rashes, purpura or petechiae. WWP, intact Neuro: Normal gait. PERLA. EOMi. Alert. Oriented x3   No exam data present No results found. No results found for this or any previous visit (from the past 24 hour(s)).  Assessment/Plan: [name] is a 46 y.o. female present for OV for  Lower extremity edema - discussed ddx: amlodipine, sodium, venous stasis - low sodium diet encouraged, AVS provided. Watch salad dressings  can be high in sodium.  - elevate legs when able.  - BMP, TSH - she is to monitor BP at home.  - consider compression stockings.  - low dose HCTZ start (12.5 mg QD) she is to make her psych aware of new med. (low dose but may alter lithium levels).  - has upcoming appt, scheduled in 3 weeks.   Reviewed expectations re: course of current medical issues.  Discussed self-management of symptoms.  Outlined signs and symptoms indicating need for more acute intervention.  Patient verbalized understanding and all questions were answered.  Patient received an After-Visit Summary.   Note is dictated utilizing voice recognition software. Although note has been proof read prior to signing, occasional typographical errors still can be missed. If any questions arise, please do not hesitate to call for verification.   electronically signed by:  Howard Pouch, DO  [place]

## 2016-12-31 NOTE — Patient Instructions (Signed)
Start HCTZ (diuretic 12.5 mg) daily.  We will check labs today and call you with results.  Elevate legs when able.  Low sodium.     DASH Eating Plan DASH stands for "Dietary Approaches to Stop Hypertension." The DASH eating plan is a healthy eating plan that has been shown to reduce high blood pressure (hypertension). It may also reduce your risk for type 2 diabetes, heart disease, and stroke. The DASH eating plan may also help with weight loss. What are tips for following this plan? General guidelines   Avoid eating more than 2,300 mg (milligrams) of salt (sodium) a day. If you have hypertension, you may need to reduce your sodium intake to 1,500 mg a day.  Limit alcohol intake to no more than 1 drink a day for nonpregnant women and 2 drinks a day for men. One drink equals 12 oz of beer, 5 oz of wine, or 1 oz of hard liquor.  Work with your health care provider to maintain a healthy body weight or to lose weight. Ask what an ideal weight is for you.  Get at least 30 minutes of exercise that causes your heart to beat faster (aerobic exercise) most days of the week. Activities may include walking, swimming, or biking.  Work with your health care provider or diet and nutrition specialist (dietitian) to adjust your eating plan to your individual calorie needs. Reading food labels   Check food labels for the amount of sodium per serving. Choose foods with less than 5 percent of the Daily Value of sodium. Generally, foods with less than 300 mg of sodium per serving fit into this eating plan.  To find whole grains, look for the word "whole" as the first word in the ingredient list. Shopping   Buy products labeled as "low-sodium" or "no salt added."  Buy fresh foods. Avoid canned foods and premade or frozen meals. Cooking   Avoid adding salt when cooking. Use salt-free seasonings or herbs instead of table salt or sea salt. Check with your health care provider or pharmacist before using  salt substitutes.  Do not fry foods. Cook foods using healthy methods such as baking, boiling, grilling, and broiling instead.  Cook with heart-healthy oils, such as olive, canola, soybean, or sunflower oil. Meal planning    Eat a balanced diet that includes:  5 or more servings of fruits and vegetables each day. At each meal, try to fill half of your plate with fruits and vegetables.  Up to 6-8 servings of whole grains each day.  Less than 6 oz of lean meat, poultry, or fish each day. A 3-oz serving of meat is about the same size as a deck of cards. One egg equals 1 oz.  2 servings of low-fat dairy each day.  A serving of nuts, seeds, or beans 5 times each week.  Heart-healthy fats. Healthy fats called Omega-3 fatty acids are found in foods such as flaxseeds and coldwater fish, like sardines, salmon, and mackerel.  Limit how much you eat of the following:  Canned or prepackaged foods.  Food that is high in trans fat, such as fried foods.  Food that is high in saturated fat, such as fatty meat.  Sweets, desserts, sugary drinks, and other foods with added sugar.  Full-fat dairy products.  Do not salt foods before eating.  Try to eat at least 2 vegetarian meals each week.  Eat more home-cooked food and less restaurant, buffet, and fast food.  When eating at a  restaurant, ask that your food be prepared with less salt or no salt, if possible. What foods are recommended? The items listed may not be a complete list. Talk with your dietitian about what dietary choices are best for you. Grains  Whole-grain or whole-wheat bread. Whole-grain or whole-wheat pasta. Brown rice. [place]. Bulgur. Whole-grain and low-sodium cereals. Pita bread. Low-fat, low-sodium crackers. Whole-wheat flour tortillas. Vegetables  Fresh or frozen vegetables (raw, steamed, roasted, or grilled). Low-sodium or reduced-sodium tomato and vegetable juice. Low-sodium or reduced-sodium tomato sauce and  tomato paste. Low-sodium or reduced-sodium canned vegetables. Fruits  All fresh, dried, or frozen fruit. Canned fruit in natural juice (without added sugar). Meat and other protein foods  Skinless chicken or [place]. Ground chicken or [place]. Pork with fat trimmed off. Fish and seafood. Egg whites. Dried beans, peas, or lentils. Unsalted nuts, nut butters, and seeds. Unsalted canned beans. Lean cuts of beef with fat trimmed off. Low-sodium, lean deli meat. Dairy  Low-fat (1%) or fat-free (skim) milk. Fat-free, low-fat, or reduced-fat cheeses. Nonfat, low-sodium ricotta or cottage cheese. Low-fat or nonfat yogurt. Low-fat, low-sodium cheese. Fats and oils  Soft margarine without trans fats. Vegetable oil. Low-fat, reduced-fat, or light mayonnaise and salad dressings (reduced-sodium). Canola, safflower, olive, soybean, and sunflower oils. Avocado. Seasoning and other foods  Herbs. Spices. Seasoning mixes without salt. Unsalted popcorn and pretzels. Fat-free sweets. What foods are not recommended? The items listed may not be a complete list. Talk with your dietitian about what dietary choices are best for you. Grains  Baked goods made with fat, such as croissants, muffins, or some breads. Dry pasta or rice meal packs. Vegetables  Creamed or fried vegetables. Vegetables in a cheese sauce. Regular canned vegetables (not low-sodium or reduced-sodium). Regular canned tomato sauce and paste (not low-sodium or reduced-sodium). Regular tomato and vegetable juice (not low-sodium or reduced-sodium). Angie Fava. Olives. Fruits  Canned fruit in a light or heavy syrup. Fried fruit. Fruit in cream or butter sauce. Meat and other protein foods  Fatty cuts of meat. Ribs. Fried meat. Berniece Salines. Sausage. Bologna and other processed lunch meats. Salami. Fatback. Hotdogs. Bratwurst. Salted nuts and seeds. Canned beans with added salt. Canned or smoked fish. Whole eggs or egg yolks. Chicken or [place] with skin. Dairy  Whole  or 2% milk, cream, and half-and-half. Whole or full-fat cream cheese. Whole-fat or sweetened yogurt. Full-fat cheese. Nondairy creamers. Whipped toppings. Processed cheese and cheese spreads. Fats and oils  Butter. Stick margarine. Lard. Shortening. Ghee. Bacon fat. Tropical oils, such as coconut, palm kernel, or palm oil. Seasoning and other foods  Salted popcorn and pretzels. Onion salt, garlic salt, seasoned salt, table salt, and sea salt. Worcestershire sauce. Tartar sauce. Barbecue sauce. Teriyaki sauce. Soy sauce, including reduced-sodium. Steak sauce. Canned and packaged gravies. Fish sauce. Oyster sauce. Cocktail sauce. Horseradish that you find on the shelf. Ketchup. Mustard. Meat flavorings and tenderizers. Bouillon cubes. Hot sauce and Tabasco sauce. Premade or packaged marinades. Premade or packaged taco seasonings. Relishes. Regular salad dressings. Where to find more information:  National Heart, Lung, and [place]: https://wilson-eaton.com/  American Heart Association: www.heart.org Summary  The DASH eating plan is a healthy eating plan that has been shown to reduce high blood pressure (hypertension). It may also reduce your risk for type 2 diabetes, heart disease, and stroke.  With the DASH eating plan, you should limit salt (sodium) intake to 2,300 mg a day. If you have hypertension, you may need to reduce your sodium intake to 1,500  mg a day.  When on the DASH eating plan, aim to eat more fresh fruits and vegetables, whole grains, lean proteins, low-fat dairy, and heart-healthy fats.  Work with your health care provider or diet and nutrition specialist (dietitian) to adjust your eating plan to your individual calorie needs. This information is not intended to replace advice given to you by your health care provider. Make sure you discuss any questions you have with your health care provider. Document Released: 08/08/2011 Document Revised: 08/12/2016 Document Reviewed:  08/12/2016 Elsevier Interactive Patient Education  2017 [employer].

## 2017-01-01 ENCOUNTER — Telehealth: Payer: Self-pay | Admitting: Family Medicine

## 2017-01-01 NOTE — Telephone Encounter (Signed)
Please call pt: - her labs are normal - please remind her to discuss the potential start of the HCTZ 12.5 mg with her psychiatrist. Preferably before starting the medication. It had been called in for her.  - We discussed during her visit, that although this is a very low dose, it can cause lithium levels to be increased and lithium would need monitored more closely if we proceed with the use of the HCTZ for her edema. Depending on how her psychiatrist feels about the co-use, we may not be able to use it.

## 2017-01-01 NOTE — Telephone Encounter (Signed)
Spoke with patient reviewed lab results and information . Patient will follow up with psychiatry and let us know what they recommend.

## 2017-01-02 ENCOUNTER — Telehealth: Payer: Self-pay | Admitting: Family Medicine

## 2017-01-02 MED ORDER — FUROSEMIDE 20 MG PO TABS: ORAL_TABLET | ORAL | 3 refills | Status: DC

## 2017-01-02 NOTE — Telephone Encounter (Signed)
Patient states she was inform hydrochlorothiazide (HYDRODIURIL) 25 MG tablet will interact with lithium carbonate (LITHOBID) 300 MG CR tablet she is also taking.  Her doctor who is prescribing the lithium does not want to change her medication regimen.  Patient states she was told to call pcp to see if the hydrochlorothiazide (HYDRODIURIL) 25 MG tablet can be changed to something else.  Pharmacy:  [id number] - [place], [place] [address]

## 2017-01-02 NOTE — Telephone Encounter (Signed)
Spoke with patient reviewed information and instructions patient verbalized understanding. 

## 2017-01-02 NOTE — Telephone Encounter (Signed)
Please call pt: - I have switched the medicine to another diuretic, this one is to be used once daily only if needed for increased swelling. Ideally not taken daily.   - The main course of treatment is a low sodium diet, weight loss, elevate legs, low alcohol use, and if needed compression stockings. It is a frustrating condition, sometimes only occurs in warmer months, but no real known effective treatment for this condition.

## 2017-01-07 ENCOUNTER — Telehealth: Payer: Self-pay | Admitting: Family Medicine

## 2017-01-07 DIAGNOSIS — R600 Localized edema: Secondary | ICD-10-CM

## 2017-01-07 DIAGNOSIS — E538 Deficiency of other specified B group vitamins: Secondary | ICD-10-CM

## 2017-01-07 DIAGNOSIS — E785 Hyperlipidemia, unspecified: Secondary | ICD-10-CM

## 2017-01-07 DIAGNOSIS — R609 Edema, unspecified: Secondary | ICD-10-CM | POA: Insufficient documentation

## 2017-01-07 NOTE — Telephone Encounter (Signed)
Spoke with patient - scheduled lab appt.

## 2017-01-07 NOTE — Telephone Encounter (Signed)
Orders placed for her ; fasting labs 2 days prior to appt

## 2017-01-07 NOTE — Telephone Encounter (Signed)
I dont see mention of repeating lipids in your notes . Do you want to recheck this? Please advise

## 2017-01-07 NOTE — Telephone Encounter (Signed)
Patient wants to know if she can have order to have lipid panel checked prior to appt on 01/20/17.

## 2017-01-17 ENCOUNTER — Other Ambulatory Visit (HOSPITAL_COMMUNITY): Payer: Self-pay | Admitting: Interventional Radiology

## 2017-01-17 ENCOUNTER — Other Ambulatory Visit (INDEPENDENT_AMBULATORY_CARE_PROVIDER_SITE_OTHER): Payer: Commercial Managed Care - PPO

## 2017-01-17 DIAGNOSIS — R7989 Other specified abnormal findings of blood chemistry: Secondary | ICD-10-CM | POA: Diagnosis not present

## 2017-01-17 DIAGNOSIS — I729 Aneurysm of unspecified site: Secondary | ICD-10-CM

## 2017-01-17 DIAGNOSIS — E538 Deficiency of other specified B group vitamins: Secondary | ICD-10-CM

## 2017-01-17 DIAGNOSIS — E785 Hyperlipidemia, unspecified: Secondary | ICD-10-CM

## 2017-01-17 DIAGNOSIS — R600 Localized edema: Secondary | ICD-10-CM | POA: Diagnosis not present

## 2017-01-17 LAB — LIPID PANEL
Cholesterol: 192 mg/dL (ref 0–200)
HDL: 65.7 mg/dL (ref >39.00)
NonHDL: 126.72
Total CHOL/HDL Ratio: 3
Triglycerides: 212 mg/dL — ABNORMAL HIGH (ref 0.0–149.0)
VLDL: 42.4 mg/dL — ABNORMAL HIGH (ref 0.0–40.0)

## 2017-01-17 LAB — BASIC METABOLIC PANEL WITH GFR
BUN: 13 mg/dL (ref 7–25)
CO2: 23 mmol/L (ref 20–31)
Calcium: 9.3 mg/dL (ref 8.6–10.2)
Chloride: 104 mmol/L (ref 98–110)
Creat: 1.02 mg/dL (ref 0.50–1.10)
GFR, Est African American: 78 mL/min (ref >=60)
GFR, Est Non African American: 68 mL/min (ref >=60)
Glucose, Bld: 121 mg/dL — ABNORMAL HIGH (ref 65–99)
Potassium: 4.3 mmol/L (ref 3.5–5.3)
Sodium: 140 mmol/L (ref 135–146)

## 2017-01-17 LAB — VITAMIN B12: Vitamin B-12: 463 pg/mL (ref 211–911)

## 2017-01-17 LAB — LDL CHOLESTEROL, DIRECT: Direct LDL: 91 mg/dL

## 2017-01-20 ENCOUNTER — Ambulatory Visit: Payer: Self-pay | Admitting: Family Medicine

## 2017-01-22 ENCOUNTER — Encounter: Payer: Self-pay | Admitting: Family Medicine

## 2017-01-22 ENCOUNTER — Ambulatory Visit (INDEPENDENT_AMBULATORY_CARE_PROVIDER_SITE_OTHER): Payer: Commercial Managed Care - PPO | Admitting: Family Medicine

## 2017-01-22 VITALS — BP 126/86 | HR 62 | Temp 98.9°F | Resp 20 | Wt 248.5 lb

## 2017-01-22 DIAGNOSIS — I10 Essential (primary) hypertension: Secondary | ICD-10-CM

## 2017-01-22 DIAGNOSIS — Z6839 Body mass index (BMI) 39.0-39.9, adult: Secondary | ICD-10-CM | POA: Diagnosis not present

## 2017-01-22 DIAGNOSIS — R600 Localized edema: Secondary | ICD-10-CM

## 2017-01-22 DIAGNOSIS — I671 Cerebral aneurysm, nonruptured: Secondary | ICD-10-CM

## 2017-01-22 DIAGNOSIS — E785 Hyperlipidemia, unspecified: Secondary | ICD-10-CM | POA: Diagnosis not present

## 2017-01-22 MED ORDER — ATORVASTATIN CALCIUM 40 MG PO TABS: 40.0000 mg | ORAL_TABLET | Freq: Every day | ORAL | 3 refills | Status: DC

## 2017-01-22 MED ORDER — PROPRANOLOL HCL ER 80 MG PO CP24: 80.0000 mg | ORAL_CAPSULE | Freq: Every day | ORAL | 1 refills | Status: DC

## 2017-01-22 MED ORDER — AMLODIPINE BESYLATE 10 MG PO TABS: 10.0000 mg | ORAL_TABLET | Freq: Every day | ORAL | 1 refills | Status: DC

## 2017-01-22 NOTE — Progress Notes (Signed)
[name] , 01-23-1971, 46 y.o., female MRN: [id number] Patient Care Team    Relationship Specialty Notifications Start End  Ma Hillock, DO PCP - General Family Medicine  05/01/15   Sheralyn Boatman, MD Consulting Physician Psychiatry  11/27/15   Dan Humphreys  Nurse Practitioner  12/26/15   Molli Posey, MD Consulting Physician Obstetrics and Gynecology  09/13/16     Chief Complaint  Patient presents with  . Hypertension     Subjective:  Hypertension/Hyperlipidemia/LE edema:   She reports compliance with her Inderal LA, Lipitor and amlodipine. She has started the lasix 20 mg QD PRN and averaging about 3x a week, with only mild improvement in LE edema. Blood pressures ranges at home WNL ranges. Patient denies chest pain, shortness of breath or lower extremity edema.  ASA. Pt is  prescribed statin. Triglycerides elevated 180-212, otherwise lipid panel was better. Pt has a h/o cerebral aneurysm followed by neuro IR.  BMP: 01/17/2017 WNL CBC: 09/11/2016 WNL Diet: low salt  Exercise: exercising routinely RF: HLD, stable coiled aneurysm, HTN, Fhx  Lipid Panel     Component Value Date/Time   CHOL 192 01/17/2017 0758   TRIG 212.0 (H) 01/17/2017 0758   HDL 65.70 01/17/2017 0758   CHOLHDL 3 01/17/2017 0758   VLDL 42.4 (H) 01/17/2017 0758   LDLCALC 106 (H) 09/11/2016 0803   LDLDIRECT 91.0 01/17/2017 0758    Depression screen PHQ 2/9 01/22/2017 09/13/2016 11/23/2015  Decreased Interest 0 0 3  Down, Depressed, Hopeless 0 1 3  PHQ - 2 Score 0 1 6  Altered sleeping - - 3  Tired, decreased energy - - 3  Change in appetite - - 3  Feeling bad or failure about yourself  - - 3  Trouble concentrating - - 2  Moving slowly or fidgety/restless - - 2  Suicidal thoughts - - 1  PHQ-9 Score - - 23  Difficult doing work/chores - - Somewhat difficult  Some encounter information is confidential and restricted. Go to Review Flowsheets activity to see all data.    Allergies    Allergen Reactions  . Ace Inhibitors Swelling and Other (See Comments)    Angioedema    Social History  Substance Use Topics  . Smoking status: Never Smoker  . Smokeless tobacco: Never Used  . Alcohol use Yes     Comment: wine and beer on occasion   Past Medical History:  Diagnosis Date  . Anxiety    takes Ativan daily as needed  . Arthritis   . Depression    psychiatry  . GERD (gastroesophageal reflux disease)   . Headache(784.0)   . Hyperlipidemia    takes Atorvastatin daily  . Hypertension    takes Amlodipine and Propranolol daily  . Insomnia    takes Trazodone at bedtime  . PONV (postoperative nausea and vomiting)   . 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 BRAIN SURGERY    . DILATION AND CURETTAGE OF UTERUS  2009  . LAPAROTOMY    . MYOMECTOMY  2009  . MYOMECTOMY  05/16/2011   Procedure: MYOMECTOMY;  Surgeon: Margarette Asal;  Location: [facility];  Service: Gynecology;  Laterality: N/A;  abdominal  . RADIOLOGY WITH ANESTHESIA N/A 02/18/2013   Procedure: RADIOLOGY WITH ANESTHESIA;  Surgeon: Rob Hickman, MD;  Location: [facility];  Service: Radiology;  Laterality: N/A;  . RADIOLOGY WITH  ANESTHESIA N/A 01/26/2014   Procedure: RADIOLOGY WITH ANESTHESIA;  Surgeon: Rob Hickman, MD;  Location: [place] [facility];  Service: Radiology;  Laterality: N/A;  . RADIOLOGY WITH ANESTHESIA N/A 07/25/2014   Procedure: RADIOLOGY WITH ANESTHESIA;  Surgeon: Rob Hickman, MD;  Location: [place];  Service: Radiology;  Laterality: N/A;   Family History  Problem Relation Age of Onset  . Heart disease Mother 43       cad  . Lupus Mother   . COPD Mother   . Arthritis Mother        rheumatoid  . AVM Mother   . Diabetes Mother   . Bipolar disorder Mother   . Drug abuse Mother   . Diabetes Father   . Cancer Father        thyroid  . Depression Sister   . Diabetes Sister   .  Hyperlipidemia Sister   . Hypertension Sister   . Cancer Paternal Grandfather        colon  . Alcohol abuse Paternal Uncle   . Drug abuse Maternal Grandfather   . Alcohol abuse Maternal Grandfather    Allergies as of 01/22/2017      Reactions   Ace Inhibitors Swelling, Other (See Comments)   Angioedema       Medication List       Accurate as of 01/22/17  9:02 AM. Always use your most recent med list.          amLODipine 10 MG tablet Commonly known as:  NORVASC Take 1 tablet (10 mg total) by mouth daily. Needs office visit prior to anymore refills.   aspirin EC 325 MG tablet Take 325 mg by mouth daily.   atorvastatin 40 MG tablet Commonly known as:  LIPITOR Take 1 tablet (40 mg total) by mouth daily. Needs office visit prior to anymore refills.   clonazePAM 0.5 MG tablet Commonly known as:  KLONOPIN Take 0.5-1 mg by mouth 2 (two) times daily. 0.5mg  in the mornnig, 1mg  in the evening   DULoxetine 30 MG capsule Commonly known as:  CYMBALTA Take 90 mg by mouth daily.   FISH OIL PO Take 1 capsule by mouth daily.   furosemide 20 MG tablet Commonly known as:  LASIX 1/2-1  tab once daily PRN   lithium carbonate 300 MG CR tablet Commonly known as:  LITHOBID Take 300 mg by mouth 2 (two) times daily.   multivitamin with minerals Tabs tablet Take 1 tablet by mouth daily.   norgestimate-ethinyl estradiol 0.25-35 MG-MCG tablet Commonly known as:  ORTHO-CYCLEN,SPRINTEC,PREVIFEM Take 1 tablet by mouth daily.   propranolol ER 80 MG 24 hr capsule Commonly known as:  INDERAL LA Take 1 capsule (80 mg total) by mouth at bedtime.   zolpidem 12.5 MG CR tablet Commonly known as:  AMBIEN CR Take 12.5 mg by mouth at bedtime.       All past medical history, surgical history, allergies, family history, immunizations andmedications were updated in the EMR today and reviewed under the history and medication portions of their EMR.     ROS: Negative, with the exception of above  mentioned in HPI   Objective:  BP 126/86 (BP Location: Right Arm, Patient Position: Sitting, Cuff Size: Large)   Pulse 62   Temp 98.9 F (37.2 C)   Resp 20   Wt 248 lb 8 oz (112.7 kg)   SpO2 98%   BMI 40.11 kg/m  Body mass index is 40.11 kg/m. Gen: Afebrile. No acute distress.  HENT: AT. [place].MMM Eyes:Pupils Equal Round Reactive to light, Extraocular movements intact,  Conjunctiva without redness, discharge or icterus. CV: RRR no murmur, trace edema, +2/4 P posterior tibialis pulses Chest: CTAB, no wheeze or crackles Skin: no rashes, purpura or petechiae.  Neuro: Normal gait. PERLA. EOMi. Alert. Oriented x3 Psych: Normal affect, dress and demeanor. Normal speech. Normal thought content and judgment.  No exam data present No results found. No results found for this or any previous visit (from the past 24 hour(s)).  Assessment/Plan: REGNIA MATHWIG is a 46 y.o. female present for OV for  Essential hypertension Anterior cerebral artery aneurysm BMI 39.0-39.9,adult - well controlled.  - Follow with Neuro-IR as requested for Coiled Cerebral aneurysm. Stable.  - continue exercise and low sodium diet. She is trying to lose weight and exercising.  - amLODipine (NORVASC) 10 MG tablet; Take 1 tablet (10 mg total) by mouth daily. Needs office visit prior to anymore refills.  Dispense: 90 tablet; Refill: 1 - propranolol ER (INDERAL LA) 80 MG 24 hr capsule; Take 1 capsule (80 mg total) by mouth at bedtime.  Dispense: 90 capsule; Refill: 1  Hyperlipidemia, unspecified hyperlipidemia type - make certain fish oil supplement is 3-4g daily. If we have to could increase statin to Lipitor 80 mg, however she is dieting and exercising and rest of lipid panel looks good. Increase fiber.  - Recheck at CPE - atorvastatin (LIPITOR) 40 MG tablet; Take 1 tablet (40 mg total) by mouth daily. Needs office visit prior to anymore refills.  Dispense: 90 tablet; Refill: 3  Localized edema - discussed  ddx: amlodipine, sodium, venous stasis, obesity - low sodium diet encouraged, which she has been doing  - elevate legs when able.  - BMP and TSH normal - she is to monitor BP at home, has been normal ranges by report - started compression stockings at night. - lasix 20 mg QD  PRN, averaging about 3x week, notices only mild improvement.  - discussed if worsening  would want to obtain echo of heart and consider daily lasix.   Reviewed expectations re: course of current medical issues.  Discussed self-management of symptoms.  Outlined signs and symptoms indicating need for more acute intervention.  Patient verbalized understanding and all questions were answered.  Patient received an After-Visit Summary.   Note is dictated utilizing voice recognition software. Although note has been proof read prior to signing, occasional typographical errors still can be missed. If any questions arise, please do not hesitate to call for verification.   electronically signed by:  Howard Pouch, DO  [place]

## 2017-01-22 NOTE — Patient Instructions (Signed)
Continue meds, no changes today. BP looks great.  Make certain you are taking about 3000-4000 mg fish oil daily. Your cholesterol looks great, but the triglycerides portion is still mildly elevated.   Increase fiber, fresh vegetables, low sodium.  F/u 6 months as long as going well. Sooner if you need me.  Yearly physicals encouraged.   At-home Treatments for lowering triglycerides.  The main way to deal with high triglycerides is to eat better and get more exercise. Here are some guidelines to help you manage your level: Moderate exercise: Try to exercise 5 or more days each week. Talk to your doctor before you begin any exercise plan. Watch your weight: If you're carrying extra pounds, losing 5% to 10% of your weight can lower triglycerides. People with a healthy weight are more likely to have normal levels. Belly fat is associated with higher numbers. Eat less bad fat: Try to lower the saturated fat, trans fat, and cholesterol in your diet. Cutting back on carbohydrates will help, too. Drink less alcohol: Beer, wine, and liquor can raise levels. Some studies show that more than 1 drink a day for women or 2 for men can increase levels by a lot. Go fish: Mackerel, lake trout, herring, sardines, albacore tuna, and salmon are high in omega-3s, a fat that's good for you. It may be hard to get enough omega-3s from food. Your doctor may recommend a supplement or prescription.

## 2017-01-30 ENCOUNTER — Encounter (HOSPITAL_COMMUNITY): Payer: Self-pay

## 2017-01-30 ENCOUNTER — Ambulatory Visit (HOSPITAL_COMMUNITY)
Admission: RE | Admit: 2017-01-30 | Discharge: 2017-01-30 | Disposition: A | Discharge status: Home / Self Care | Payer: Commercial Managed Care - PPO | Source: Ambulatory Visit | Attending: Interventional Radiology | Admitting: Interventional Radiology

## 2017-01-30 ENCOUNTER — Ambulatory Visit (HOSPITAL_COMMUNITY): Payer: Commercial Managed Care - PPO

## 2017-01-30 DIAGNOSIS — I729 Aneurysm of unspecified site: Secondary | ICD-10-CM | POA: Diagnosis present

## 2017-01-30 DIAGNOSIS — I671 Cerebral aneurysm, nonruptured: Secondary | ICD-10-CM | POA: Insufficient documentation

## 2017-01-30 MED ORDER — GADOBENATE DIMEGLUMINE 529 MG/ML IV SOLN
20.0000 mL | Freq: Once | INTRAVENOUS | Status: AC
  Administered 2017-01-30: 20 mL via INTRAVENOUS

## 2017-02-05 ENCOUNTER — Telehealth (HOSPITAL_COMMUNITY): Payer: Self-pay

## 2017-02-05 NOTE — Telephone Encounter (Signed)
Pt agreed to f/u in 6 months with mra. AW  

## 2017-04-23 LAB — HM MAMMOGRAPHY

## 2017-07-16 ENCOUNTER — Encounter: Payer: Self-pay | Admitting: *Deleted

## 2017-07-16 ENCOUNTER — Other Ambulatory Visit: Payer: Self-pay | Admitting: *Deleted

## 2017-07-16 DIAGNOSIS — I10 Essential (primary) hypertension: Secondary | ICD-10-CM

## 2017-07-16 MED ORDER — AMLODIPINE BESYLATE 10 MG PO TABS: 10.0000 mg | ORAL_TABLET | Freq: Every day | ORAL | 0 refills | Status: DC

## 2017-07-16 MED ORDER — PROPRANOLOL HCL ER 80 MG PO CP24: 80.0000 mg | ORAL_CAPSULE | Freq: Every day | ORAL | 0 refills | Status: DC

## 2017-07-21 ENCOUNTER — Telehealth (HOSPITAL_COMMUNITY): Payer: Self-pay

## 2017-07-21 NOTE — Telephone Encounter (Signed)
Called to schedule 1 yr f/u mra, left message for pt to return call. AW

## 2017-07-23 ENCOUNTER — Other Ambulatory Visit (HOSPITAL_COMMUNITY): Payer: Self-pay | Admitting: Interventional Radiology

## 2017-07-23 DIAGNOSIS — I729 Aneurysm of unspecified site: Secondary | ICD-10-CM

## 2017-07-29 ENCOUNTER — Ambulatory Visit (HOSPITAL_COMMUNITY)
Admission: RE | Admit: 2017-07-29 | Discharge: 2017-07-29 | Disposition: A | Discharge status: Home / Self Care | Payer: Commercial Managed Care - PPO | Source: Ambulatory Visit | Attending: Interventional Radiology | Admitting: Interventional Radiology

## 2017-07-29 DIAGNOSIS — I729 Aneurysm of unspecified site: Secondary | ICD-10-CM

## 2017-07-29 DIAGNOSIS — I671 Cerebral aneurysm, nonruptured: Secondary | ICD-10-CM | POA: Insufficient documentation

## 2017-08-01 ENCOUNTER — Telehealth (HOSPITAL_COMMUNITY): Payer: Self-pay

## 2017-08-01 NOTE — Telephone Encounter (Signed)
Pt agreed to f/u in 6 months with mra wo per Dr. Estanislado Pandy. AW

## 2017-09-01 ENCOUNTER — Other Ambulatory Visit: Payer: Self-pay | Admitting: *Deleted

## 2017-09-01 ENCOUNTER — Encounter: Payer: Self-pay | Admitting: *Deleted

## 2017-09-01 DIAGNOSIS — I10 Essential (primary) hypertension: Secondary | ICD-10-CM

## 2017-09-01 MED ORDER — AMLODIPINE BESYLATE 10 MG PO TABS: 10.0000 mg | ORAL_TABLET | Freq: Every day | ORAL | 0 refills | Status: DC

## 2017-09-01 MED ORDER — PROPRANOLOL HCL ER 80 MG PO CP24: 80.0000 mg | ORAL_CAPSULE | Freq: Every day | ORAL | 0 refills | Status: DC

## 2017-09-15 ENCOUNTER — Encounter: Payer: Self-pay | Admitting: Family Medicine

## 2017-09-17 ENCOUNTER — Ambulatory Visit (INDEPENDENT_AMBULATORY_CARE_PROVIDER_SITE_OTHER): Payer: Commercial Managed Care - PPO | Admitting: Family Medicine

## 2017-09-17 ENCOUNTER — Encounter: Payer: Self-pay | Admitting: Family Medicine

## 2017-09-17 VITALS — BP 125/79 | HR 66 | Temp 98.0°F | Ht 65.4 in | Wt 259.4 lb

## 2017-09-17 DIAGNOSIS — Z0000 Encounter for general adult medical examination without abnormal findings: Secondary | ICD-10-CM

## 2017-09-17 DIAGNOSIS — Z131 Encounter for screening for diabetes mellitus: Secondary | ICD-10-CM

## 2017-09-17 DIAGNOSIS — I10 Essential (primary) hypertension: Secondary | ICD-10-CM | POA: Diagnosis not present

## 2017-09-17 DIAGNOSIS — E785 Hyperlipidemia, unspecified: Secondary | ICD-10-CM | POA: Diagnosis not present

## 2017-09-17 DIAGNOSIS — E538 Deficiency of other specified B group vitamins: Secondary | ICD-10-CM

## 2017-09-17 LAB — CBC WITH DIFFERENTIAL/PLATELET
Basophils Absolute: 0.1 10*3/uL (ref 0.0–0.1)
Basophils Relative: 0.7 % (ref 0.0–3.0)
Eosinophils Absolute: 0.2 10*3/uL (ref 0.0–0.7)
Eosinophils Relative: 1.9 % (ref 0.0–5.0)
HCT: 39.4 % (ref 36.0–46.0)
Hemoglobin: 13 g/dL (ref 12.0–15.0)
Lymphocytes Relative: 25.3 % (ref 12.0–46.0)
Lymphs Abs: 2.3 10*3/uL (ref 0.7–4.0)
MCHC: 33 g/dL (ref 30.0–36.0)
MCV: 95.7 fl (ref 78.0–100.0)
Monocytes Absolute: 0.6 10*3/uL (ref 0.1–1.0)
Monocytes Relative: 7.1 % (ref 3.0–12.0)
Neutro Abs: 5.8 10*3/uL (ref 1.4–7.7)
Neutrophils Relative %: 65 % (ref 43.0–77.0)
Platelets: 365 10*3/uL (ref 150.0–400.0)
RBC: 4.11 Mil/uL (ref 3.87–5.11)
RDW: 12.6 % (ref 11.5–15.5)
WBC: 8.9 10*3/uL (ref 4.0–10.5)

## 2017-09-17 LAB — LIPID PANEL
Cholesterol: 180 mg/dL (ref 0–200)
HDL: 59.3 mg/dL (ref >39.00)
LDL Cholesterol: 85 mg/dL (ref 0–99)
NonHDL: 121.07
Total CHOL/HDL Ratio: 3
Triglycerides: 179 mg/dL — ABNORMAL HIGH (ref 0.0–149.0)
VLDL: 35.8 mg/dL (ref 0.0–40.0)

## 2017-09-17 LAB — COMPREHENSIVE METABOLIC PANEL
ALT: 21 U/L (ref 0–35)
AST: 21 U/L (ref 0–37)
Albumin: 4.1 g/dL (ref 3.5–5.2)
Alkaline Phosphatase: 82 U/L (ref 39–117)
BUN: 15 mg/dL (ref 6–23)
CO2: 29 mEq/L (ref 19–32)
Calcium: 9.7 mg/dL (ref 8.4–10.5)
Chloride: 102 mEq/L (ref 96–112)
Creatinine, Ser: 0.89 mg/dL (ref 0.40–1.20)
GFR: 73.2 mL/min (ref >60.00)
Glucose, Bld: 102 mg/dL — ABNORMAL HIGH (ref 70–99)
Potassium: 4.3 mEq/L (ref 3.5–5.1)
Sodium: 138 mEq/L (ref 135–145)
Total Bilirubin: 0.3 mg/dL (ref 0.2–1.2)
Total Protein: 6.9 g/dL (ref 6.0–8.3)

## 2017-09-17 LAB — VITAMIN B12: Vitamin B-12: 784 pg/mL (ref 211–911)

## 2017-09-17 LAB — HEMOGLOBIN A1C: Hgb A1c MFr Bld: 5.8 % (ref 4.6–6.5)

## 2017-09-17 LAB — TSH: TSH: 4.12 u[IU]/mL (ref 0.35–4.50)

## 2017-09-17 MED ORDER — ATORVASTATIN CALCIUM 40 MG PO TABS: 40.0000 mg | ORAL_TABLET | Freq: Every day | ORAL | 3 refills | Status: DC

## 2017-09-17 MED ORDER — PROPRANOLOL HCL ER 80 MG PO CP24: 80.0000 mg | ORAL_CAPSULE | Freq: Every day | ORAL | 0 refills | Status: DC

## 2017-09-17 MED ORDER — AMLODIPINE BESYLATE 10 MG PO TABS: 10.0000 mg | ORAL_TABLET | Freq: Every day | ORAL | 0 refills | Status: DC

## 2017-09-17 NOTE — Progress Notes (Signed)
Patient ID: [name], female  DOB: Jan 13, 1971, 47 y.o.   MRN: [id number] Patient Care Team    Relationship Specialty Notifications Start End  Ma Hillock, DO PCP - General Family Medicine  05/01/15   Sheralyn Boatman, MD Consulting Physician Psychiatry  11/27/15   Dan Humphreys  Nurse Practitioner  12/26/15   Molli Posey, MD Consulting Physician Obstetrics and Gynecology  09/13/16     Subjective:  [name] is a 47 y.o.  Female  present for CPE. All past medical history, surgical history, allergies, family history, immunizations, medications and social history were updated in the electronic medical record today. All recent labs, ED visits and hospitalizations within the last year were reviewed.  Health maintenance:  Colonoscopy: PGF colon cancer history (82). Screen at 52.  Mammogram: 04/2017, normal, has completed at OB/GYN office. Per pt requesting records.  Cervical cancer screening: 06/20/2017; Dr. Matthew Saras Immunizations: tdap 09/2016 (due), Influenza 06/2017(encouraged yearly) Infectious disease screening: HIV completed  DEXA: N/A Assistive device: none Oxygen use: none Patient has a Dental home. Hospitalizations/ED visits: reviewed  Immunization History  Administered Date(s) Administered  . Influenza Split 05/18/2011  . Influenza,inj,Quad PF,6+ Mos 07/09/2013, 05/04/2014, 05/11/2015  . Influenza-Unspecified 06/20/2017  . Td 09/02/2005  . Tdap 09/13/2016     Past Medical History:  Diagnosis Date  . Anxiety    takes Ativan daily as needed  . Arthritis   . Depression    psychiatry  . GERD (gastroesophageal reflux disease)   . Headache(784.0)   . Hyperlipidemia    takes Atorvastatin daily  . Hypertension    takes Amlodipine and Propranolol daily  . Insomnia    takes Trazodone at bedtime  . PONV (postoperative nausea and vomiting)   . 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 Ace Inhibitors Swelling and Other (See Comments)    Angioedema    Past Surgical History:  Procedure Laterality Date  . BRAIN SURGERY    . DILATION AND CURETTAGE OF UTERUS  2009  . LAPAROTOMY    . MYOMECTOMY  2009  . MYOMECTOMY  05/16/2011   Procedure: MYOMECTOMY;  Surgeon: Margarette Asal;  Location: [facility];  Service: Gynecology;  Laterality: N/A;  abdominal  . RADIOLOGY WITH ANESTHESIA N/A 02/18/2013   Procedure: RADIOLOGY WITH ANESTHESIA;  Surgeon: Rob Hickman, MD;  Location: [place] [facility];  Service: Radiology;  Laterality: N/A;  . RADIOLOGY WITH ANESTHESIA N/A 01/26/2014   Procedure: RADIOLOGY WITH ANESTHESIA;  Surgeon: Rob Hickman, MD;  Location: [place];  Service: Radiology;  Laterality: N/A;  . RADIOLOGY WITH ANESTHESIA N/A 07/25/2014   Procedure: RADIOLOGY WITH ANESTHESIA;  Surgeon: Rob Hickman, MD;  Location: [place];  Service: Radiology;  Laterality: N/A;   Family History  Problem Relation Age of Onset  . Heart disease Mother 77       cad  . Lupus Mother   . COPD Mother   . Arthritis Mother        rheumatoid  . AVM Mother   . Diabetes Mother   . Bipolar disorder Mother   . Drug abuse Mother   . Diabetes Father   . Cancer Father        thyroid  . Depression Sister   . Diabetes Sister   . Hyperlipidemia Sister   . Hypertension Sister   . Cancer Paternal Grandfather  colon  . Alcohol abuse Paternal Uncle   . Drug abuse Maternal Grandfather   . Alcohol abuse Maternal Grandfather    Social History   Socioeconomic History  . Marital status: Married    Spouse name: Not on file  . Number of children: 0  . Years of education: Not on file  . Highest education level: Not on file  Social Needs  . Financial resource strain: Not on file  . Food insecurity - worry: Not on file  . Food insecurity - inability: Not on file  . Transportation needs - medical: Not on file  . Transportation needs - non-medical: Not on  file  Occupational History    Employer: [employer]  Tobacco Use  . Smoking status: Never Smoker  . Smokeless tobacco: Never Used  Substance and Sexual Activity  . Alcohol use: Yes    Comment: wine and beer on occasion  . Drug use: No  . Sexual activity: Yes    Birth control/protection: Pill  Other Topics Concern  . Not on file  Social History Narrative  . Not on file   Allergies as of 09/17/2017      Reactions   Ace Inhibitors Swelling, Other (See Comments)   Angioedema       Medication List        Accurate as of 09/17/17  1:12 PM. Always use your most recent med list.          amLODipine 10 MG tablet Commonly known as:  NORVASC Take 1 tablet (10 mg total) by mouth daily. Needs office visit prior to anymore refills.   aspirin EC 325 MG tablet Take 325 mg by mouth daily.   atorvastatin 40 MG tablet Commonly known as:  LIPITOR Take 1 tablet (40 mg total) by mouth daily. Needs office visit prior to anymore refills.   clonazePAM 0.5 MG tablet Commonly known as:  KLONOPIN Take 0.5-1 mg by mouth 2 (two) times daily. 0.'5mg'$  in the mornnig, '1mg'$  in the evening   DULoxetine 30 MG capsule Commonly known as:  CYMBALTA Take 90 mg by mouth daily.   FISH OIL PO Take 1 capsule by mouth daily.   furosemide 20 MG tablet Commonly known as:  LASIX 1/2-1  tab once daily PRN   lithium carbonate 300 MG CR tablet Commonly known as:  LITHOBID Take 300 mg by mouth 2 (two) times daily.   multivitamin with minerals Tabs tablet Take 1 tablet by mouth daily.   norgestimate-ethinyl estradiol 0.25-35 MG-MCG tablet Commonly known as:  ORTHO-CYCLEN,SPRINTEC,PREVIFEM Take 1 tablet by mouth daily.   propranolol ER 80 MG 24 hr capsule Commonly known as:  INDERAL LA Take 1 capsule (80 mg total) by mouth at bedtime. Needs office visit prior to anymore refills   zolpidem 12.5 MG CR tablet Commonly known as:  AMBIEN CR Take 12.5 mg by mouth at bedtime.       No results  found for this or any previous visit (from the past 2160 hour(s)).   ROS: 14 pt review of systems performed and negative (unless mentioned in an HPI)  Objective: BP 125/79 (BP Location: Right Arm, Patient Position: Sitting, Cuff Size: Large)   Pulse 66   Temp 98 F (36.7 C) (Oral)   Ht 5' 5.4" (1.661 m)   Wt 259 lb 6.4 oz (117.7 kg)   SpO2 98%   BMI 42.64 kg/m  Gen: Afebrile. No acute distress. Nontoxic in appearance, well-developed, well-nourished, obese Caucasian female. HENT: AT. .  Bilateral TM visualized and normal in appearance. MMM. Bilateral nares without erythema or fullness. Throat without erythema or exudates. No cough, no hoarseness. Eyes:Pupils Equal Round Reactive to light, Extraocular movements intact,  Conjunctiva without redness, discharge or icterus. Neck/lymp/endocrine: Supple, no lymphadenopathy, no thyromegaly CV: RRR no murmur, minimal edema, +2/4 P posterior tibialis pulses Chest: CTAB, no wheeze or crackles Abd: Soft. Obese. NTND. BS present. No Masses palpated.  Skin: No rashes, purpura or petechiae. Warm well perfused. Intact. Neuro/msk:  Normal gait. PERLA. EOMi. Alert. Oriented. Cranial nerves II through XII intact. Muscle strength 5/5 bilateral extremity. DTRs equal bilaterally. Psych: Normal affect, dress and demeanor. Normal speech. Normal thought content and judgment.  Assessment/plan: TORINA EY is a 47 y.o. female present for CPE.  Encounter for preventive health examination Patient was encouraged to exercise greater than 150 minutes a week. Patient was encouraged to choose a diet filled with fresh fruits and vegetables, and lean meats. AVS provided to patient today for education/recommendation on gender specific health and safety maintenance. Colonoscopy: PGF colon cancer history (82). Screen at 52.  Mammogram: 04/2017, normal, has completed at OB/GYN office. Per pt requesting records.  Cervical cancer screening: 06/20/2017; Dr.  Matthew Saras Immunizations: tdap 09/2016 (due), Influenza 06/2017(encouraged yearly) Infectious disease screening: HIV completed  B12 deficiency - Vitamin B12 Essential hypertension/hyperlipidemia/morbid obesity - stable. Continue amlodipine and propranolol, refills provided today. -Low-sodium diet. Diet and exercise modifications discussed today. Patient started the keto diet. - CBC w/Diff - Comp Met (CMET) - TSH - Lipid panel - Continue Lipitor 40 mg daily, refills provided today - amLODipine (NORVASC) 10 MG tablet; Take 1 tablet (10 mg total) by mouth daily. Needs office visit prior to anymore refills.  Dispense: 14 tablet; Refill: 0 - propranolol ER (INDERAL LA) 80 MG 24 hr capsule; Take 1 capsule (80 mg total) by mouth at bedtime. Needs office visit prior to anymore refills  Dispense: 14 capsule; Refill: 0 Him up 6 months Diabetes mellitus screening - HgB A1c   Return in about 1 year (around 09/17/2018) for CPE.  Electronically signed by: Howard Pouch, DO [place]

## 2017-09-17 NOTE — Patient Instructions (Signed)

## 2017-09-22 ENCOUNTER — Encounter: Payer: Self-pay | Admitting: *Deleted

## 2017-10-06 ENCOUNTER — Other Ambulatory Visit: Payer: Self-pay

## 2017-10-06 ENCOUNTER — Other Ambulatory Visit: Payer: Self-pay | Admitting: Family Medicine

## 2017-10-06 ENCOUNTER — Telehealth: Payer: Self-pay | Admitting: Family Medicine

## 2017-10-06 DIAGNOSIS — I10 Essential (primary) hypertension: Secondary | ICD-10-CM

## 2017-10-06 MED ORDER — AMLODIPINE BESYLATE 10 MG PO TABS: 10.0000 mg | ORAL_TABLET | Freq: Every day | ORAL | 5 refills | Status: DC

## 2017-10-06 MED ORDER — PROPRANOLOL HCL ER 80 MG PO CP24
80.0000 mg | ORAL_CAPSULE | Freq: Every day | ORAL | 3 refills | Status: DC
Start: 2017-10-06 — End: 2018-02-02

## 2017-10-06 NOTE — Telephone Encounter (Signed)
Patient requesting RF of propanolol.  Last CPE was 09/17/17.  Next appointment is schedule July.  I refilled # 30 x 3 rfs.

## 2017-10-06 NOTE — Telephone Encounter (Signed)
Copied from [place] [id number]. Topic: Inquiry >> Oct 06, 2017 10:53 AM Pricilla Handler wrote: Reason for CRM: Patient called stating that she needs the following medications refilled ASAP: AmLODipine (NORVASC) 10 MG tablet and Propranolol ER (INDERAL LA) 80 MG 24 hr capsule. Patient already contacted the pharmacy. Patient's preferred pharmacy is [id number] - [place], [place] [phone] (Phone) [phone] (Fax). Office pease call patient.

## 2017-10-06 NOTE — Telephone Encounter (Signed)
TC to patient as requested. Left VM for patient that the prescriptions are at the pharmacy she requested.

## 2017-10-06 NOTE — Telephone Encounter (Signed)
Refill sent to pharmacy.   

## 2018-01-02 ENCOUNTER — Other Ambulatory Visit: Payer: Self-pay | Admitting: Family Medicine

## 2018-01-02 ENCOUNTER — Telehealth: Payer: Self-pay | Admitting: *Deleted

## 2018-01-02 MED ORDER — FUROSEMIDE 20 MG PO TABS
ORAL_TABLET | ORAL | 3 refills | Status: DC
Start: 2018-01-02 — End: 2018-09-23

## 2018-01-02 NOTE — Telephone Encounter (Signed)
Refilled Lasix for patient.

## 2018-01-02 NOTE — Progress Notes (Signed)
Lasix refilled.

## 2018-01-02 NOTE — Telephone Encounter (Signed)
Received request for refill on Lasix this was last filled on 01/02/17.patient was last seen 09/17/17 for CPE. Please advise

## 2018-01-12 ENCOUNTER — Other Ambulatory Visit (HOSPITAL_COMMUNITY): Payer: Self-pay | Admitting: Interventional Radiology

## 2018-01-12 DIAGNOSIS — I729 Aneurysm of unspecified site: Secondary | ICD-10-CM

## 2018-01-19 ENCOUNTER — Ambulatory Visit (HOSPITAL_COMMUNITY)
Admission: RE | Admit: 2018-01-19 | Discharge: 2018-01-19 | Disposition: A | Discharge status: Home / Self Care | Payer: Commercial Managed Care - PPO | Source: Ambulatory Visit | Attending: Interventional Radiology | Admitting: Interventional Radiology

## 2018-01-19 DIAGNOSIS — I671 Cerebral aneurysm, nonruptured: Secondary | ICD-10-CM | POA: Diagnosis not present

## 2018-01-19 DIAGNOSIS — I729 Aneurysm of unspecified site: Secondary | ICD-10-CM | POA: Diagnosis present

## 2018-01-21 ENCOUNTER — Telehealth (HOSPITAL_COMMUNITY): Payer: Self-pay

## 2018-01-21 NOTE — Telephone Encounter (Signed)
Pt agreed to f/u in 6 months with an MRA only. AW

## 2018-02-02 ENCOUNTER — Other Ambulatory Visit: Payer: Self-pay | Admitting: *Deleted

## 2018-02-02 DIAGNOSIS — I10 Essential (primary) hypertension: Secondary | ICD-10-CM

## 2018-02-02 MED ORDER — PROPRANOLOL HCL ER 80 MG PO CP24: 80.0000 mg | ORAL_CAPSULE | Freq: Every day | ORAL | 0 refills | Status: DC

## 2018-03-10 ENCOUNTER — Other Ambulatory Visit: Payer: Self-pay

## 2018-03-10 DIAGNOSIS — I10 Essential (primary) hypertension: Secondary | ICD-10-CM

## 2018-03-10 MED ORDER — PROPRANOLOL HCL ER 80 MG PO CP24: 80.0000 mg | ORAL_CAPSULE | Freq: Every day | ORAL | 0 refills | Status: DC

## 2018-03-17 ENCOUNTER — Telehealth: Payer: Self-pay | Admitting: Family Medicine

## 2018-03-17 ENCOUNTER — Ambulatory Visit (INDEPENDENT_AMBULATORY_CARE_PROVIDER_SITE_OTHER): Payer: Commercial Managed Care - PPO | Admitting: Family Medicine

## 2018-03-17 ENCOUNTER — Encounter: Payer: Self-pay | Admitting: Family Medicine

## 2018-03-17 VITALS — BP 111/74 | HR 66 | Temp 99.0°F | Resp 20 | Ht 65.0 in | Wt 255.0 lb

## 2018-03-17 DIAGNOSIS — R600 Localized edema: Secondary | ICD-10-CM

## 2018-03-17 DIAGNOSIS — G479 Sleep disorder, unspecified: Secondary | ICD-10-CM

## 2018-03-17 DIAGNOSIS — I10 Essential (primary) hypertension: Secondary | ICD-10-CM | POA: Diagnosis not present

## 2018-03-17 DIAGNOSIS — R0683 Snoring: Secondary | ICD-10-CM | POA: Insufficient documentation

## 2018-03-17 DIAGNOSIS — R7309 Other abnormal glucose: Secondary | ICD-10-CM

## 2018-03-17 DIAGNOSIS — E785 Hyperlipidemia, unspecified: Secondary | ICD-10-CM

## 2018-03-17 DIAGNOSIS — E8881 Metabolic syndrome: Secondary | ICD-10-CM

## 2018-03-17 DIAGNOSIS — G4730 Sleep apnea, unspecified: Secondary | ICD-10-CM

## 2018-03-17 LAB — LIPID PANEL
Cholesterol: 179 mg/dL (ref 0–200)
HDL: 61.7 mg/dL (ref >39.00)
LDL Cholesterol: 81 mg/dL (ref 0–99)
NonHDL: 117.69
Total CHOL/HDL Ratio: 3
Triglycerides: 184 mg/dL — ABNORMAL HIGH (ref 0.0–149.0)
VLDL: 36.8 mg/dL (ref 0.0–40.0)

## 2018-03-17 LAB — HEMOGLOBIN A1C: Hgb A1c MFr Bld: 5.7 % (ref 4.6–6.5)

## 2018-03-17 MED ORDER — PROPRANOLOL HCL ER 80 MG PO CP24: 80.0000 mg | ORAL_CAPSULE | Freq: Every day | ORAL | 1 refills | Status: DC

## 2018-03-17 MED ORDER — AMLODIPINE BESYLATE 10 MG PO TABS: 10.0000 mg | ORAL_TABLET | Freq: Every day | ORAL | 1 refills | Status: DC

## 2018-03-17 MED ORDER — FENOFIBRATE 48 MG PO TABS: 48.0000 mg | ORAL_TABLET | Freq: Every day | ORAL | 1 refills | Status: DC

## 2018-03-17 NOTE — Patient Instructions (Signed)
I have refilled your medications.  Referral to [facility]--> for sleep study/disturnace/  We will call you with labs once available.  Your BP looks great.   F/u 6 months.

## 2018-03-17 NOTE — Telephone Encounter (Signed)
Please inform patient the following information: Her triglycerides are still mildly elevated. I have added a low dose medication called fenofibrate, it is a fiber based medication, to help lower triglycerides. The rest of her panel and diabetes screen is good. A1c is normal now.   I have palced the neurology referral for her sleep study also.

## 2018-03-17 NOTE — Progress Notes (Signed)
[name] , August 13, 1971, 47 y.o., female MRN: [id number] Patient Care Team    Relationship Specialty Notifications Start End  Ma Hillock, DO PCP - General Family Medicine  05/01/15   Sheralyn Boatman, MD Consulting Physician Psychiatry  11/27/15   Dan Humphreys  Nurse Practitioner  12/26/15   Molli Posey, MD Consulting Physician Obstetrics and Gynecology  09/13/16     Chief Complaint  Patient presents with  . Hypertension  . Hyperlipidemia     Subjective:  Hypertension/Hyperlipidemia/LE edema:   She reports appliance with her Inderal LA, Lipitor and amlodipine. She has started the lasix 20 mg QD PRN-not taking often, with only mild improvement in LE edema. Blood pressures ranges at home WNL ranges. Patient denies chest pain, shortness of breath, dizziness or lower extremity edema.  Pt is  prescribed statin and taking fish oil 3600 mg daily.  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  BMP:09/17/2017 within normal limits CBC: 09/17/2017 within normal limits Lipids: 03/17/2018 cholesterol 179, HDL 61, LDL 81, triglycerides 184. TSH: Diet: low salt  Exercise: exercising routinely RF: HLD, stable coiled aneurysm, HTN, Fhx  Lipid Panel     Component Value Date/Time   CHOL 180 09/17/2017 0910   TRIG 179.0 (H) 09/17/2017 0910   HDL 59.30 09/17/2017 0910   CHOLHDL 3 09/17/2017 0910   VLDL 35.8 09/17/2017 0910   LDLCALC 85 09/17/2017 0910   LDLDIRECT 91.0 01/17/2017 0758    Depression screen PHQ 2/9 03/17/2018 09/17/2017 01/22/2017 09/13/2016 11/23/2015  Decreased Interest 0 1 0 0 3  Down, Depressed, Hopeless 0 1 0 1 3  PHQ - 2 Score 0 2 0 1 6  Altered sleeping 1 0 - - 3  Tired, decreased energy 1 1 - - 3  Change in appetite - 1 - - 3  Feeling bad or failure about yourself  1 1 - - 3  Trouble concentrating 0 0 - - 2  Moving slowly or fidgety/restless 0 0 - - 2  Suicidal thoughts 0 0 - - 1  PHQ-9 Score 3 5 - - 23   Difficult doing work/chores Not difficult at all Not difficult at all - - Somewhat difficult  Some encounter information is confidential and restricted. Go to Review Flowsheets activity to see all data.    Allergies  Allergen Reactions  . Ace Inhibitors Swelling and Other (See Comments)    Angioedema    Social History   Tobacco Use  . Smoking status: Never Smoker  . Smokeless tobacco: Never Used  Substance Use Topics  . Alcohol use: Yes    Comment: wine and beer on occasion   Past Medical History:  Diagnosis Date  . Anxiety    takes Ativan daily as needed  . Arthritis   . Depression    psychiatry  . GERD (gastroesophageal reflux disease)   . Headache(784.0)   . Hyperlipidemia    takes Atorvastatin daily  . Hypertension    takes Amlodipine and Propranolol daily  . Insomnia    takes Trazodone at bedtime  . PONV (postoperative nausea and vomiting)   . 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 BRAIN SURGERY    . DILATION AND CURETTAGE OF UTERUS  2009  . LAPAROTOMY    . MYOMECTOMY  2009  . MYOMECTOMY  05/16/2011  Procedure: MYOMECTOMY;  Surgeon: Margarette Asal;  Location: [facility];  Service: Gynecology;  Laterality: N/A;  abdominal  . RADIOLOGY WITH ANESTHESIA N/A 02/18/2013   Procedure: RADIOLOGY WITH ANESTHESIA;  Surgeon: Rob Hickman, MD;  Location: [place];  Service: Radiology;  Laterality: N/A;  . RADIOLOGY WITH ANESTHESIA N/A 01/26/2014   Procedure: RADIOLOGY WITH ANESTHESIA;  Surgeon: Rob Hickman, MD;  Location: [place];  Service: Radiology;  Laterality: N/A;  . RADIOLOGY WITH ANESTHESIA N/A 07/25/2014   Procedure: RADIOLOGY WITH ANESTHESIA;  Surgeon: Rob Hickman, MD;  Location: [place];  Service: Radiology;  Laterality: N/A;   Family History  Problem Relation Age of Onset  . Heart disease Mother 68       cad  . Lupus Mother   . COPD Mother   .  Arthritis Mother        rheumatoid  . AVM Mother   . Diabetes Mother   . Bipolar disorder Mother   . Drug abuse Mother   . Diabetes Father   . Cancer Father        thyroid  . Depression Sister   . Diabetes Sister   . Hyperlipidemia Sister   . Hypertension Sister   . Cancer Paternal Grandfather        colon  . Alcohol abuse Paternal Uncle   . Drug abuse Maternal Grandfather   . Alcohol abuse Maternal Grandfather    Allergies as of 03/17/2018      Reactions   Ace Inhibitors Swelling, Other (See Comments)   Angioedema       Medication List        Accurate as of 03/17/18  8:17 AM. Always use your most recent med list.          amLODipine 10 MG tablet Commonly known as:  NORVASC Take 1 tablet (10 mg total) by mouth daily. Needs office visit prior to anymore refills.   aspirin EC 325 MG tablet Take 325 mg by mouth daily.   atorvastatin 40 MG tablet Commonly known as:  LIPITOR Take 1 tablet (40 mg total) by mouth daily. Needs office visit prior to anymore refills.   baclofen 10 MG tablet Commonly known as:  LIORESAL Take 1 tablet by mouth 3 (three) times daily as needed.   clonazePAM 0.5 MG tablet Commonly known as:  KLONOPIN Take 0.5-1 mg by mouth 2 (two) times daily. 0.5mg  in the mornnig, 1mg  in the evening   DULoxetine 30 MG capsule Commonly known as:  CYMBALTA Take 90 mg by mouth daily.   FISH OIL PO Take 1 capsule by mouth daily.   furosemide 20 MG tablet Commonly known as:  LASIX 1/2-1  tab once daily PRN   lithium carbonate 300 MG CR tablet Commonly known as:  LITHOBID Take 300 mg by mouth 2 (two) times daily.   multivitamin with minerals Tabs tablet Take 1 tablet by mouth daily.   norgestimate-ethinyl estradiol 0.25-35 MG-MCG tablet Commonly known as:  ORTHO-CYCLEN,SPRINTEC,PREVIFEM Take 1 tablet by mouth daily.   prochlorperazine 10 MG tablet Commonly known as:  COMPAZINE Take 1 tablet by mouth 3 (three) times daily as needed.     propranolol ER 80 MG 24 hr capsule Commonly known as:  INDERAL LA Take 1 capsule (80 mg total) by mouth at bedtime. Needs office visit prior to any additional refills   zolpidem 12.5 MG CR tablet Commonly known as:  AMBIEN CR Take 12.5 mg by mouth at bedtime.  All past medical history, surgical history, allergies, family history, immunizations andmedications were updated in the EMR today and reviewed under the history and medication portions of their EMR.     ROS: Negative, with the exception of above mentioned in HPI   Objective:  BP 111/74 (BP Location: Right Arm, Patient Position: Sitting, Cuff Size: Large)   Pulse 66   Temp 99 F (37.2 C)   Resp 20   Ht 5\' 5"  (1.651 m)   Wt 255 lb (115.7 kg)   LMP 03/17/2018   SpO2 97%   BMI 42.43 kg/m  Body mass index is 42.43 kg/m. Gen: Afebrile. No acute distress.  Nontoxic and presentation.  Very pleasant Caucasian obese female. HENT: AT. [place].  MMM.  Eyes:Pupils Equal Round Reactive to light, Extraocular movements intact,  Conjunctiva without redness, discharge or icterus. Neck/lymp/endocrine: Supple, no lymphadenopathy, no thyromegaly CV: RRR no murmur, no edema, +2/4 P posterior tibialis pulses Chest: CTAB, no wheeze or crackles Abd: Soft. NTND. BS present.  No masses palpated.  Skin: No rashes, purpura or petechiae.  Neuro:  Normal gait. PERLA. EOMi. Alert. Oriented x3  Psych: Normal affect, dress and demeanor. Normal speech. Normal thought content and judgment.   No exam data present No results found. No results found for this or any previous visit (from the past 24 hour(s)).  Assessment/Plan: JOLEY UTECHT is a 47 y.o. female present for OV for  Essential hypertension/HLD/morbid obesity Anterior cerebral artery aneurysm/extremity edema -Stable.  Continue amlodipine and propranolol, refills provided today. -Low-sodium diet. Diet and exercise modifications discussed today.  - Lipid panel - Continue Lipitor  40 mg daily, refills provided today -Continue Lasix 20 mg daily as needed.  Use compression stockings.  Weight loss can also be helpful with lower extremity edema. - amLODipine (NORVASC) 10 MG tablet; Take 1 tablet (10 mg total) by mouth daily. Needs office visit prior to anymore refills.  Dispense: 14 tablet; Refill: 0 - propranolol ER (INDERAL LA) 80 MG 24 hr capsule; Take 1 capsule (80 mg total) by mouth at bedtime. Needs office visit prior to anymore refills  Dispense: 14 capsule - make certain fish oil supplement is 3-4g daily. If we have to could increase statin to Lipitor 80 mg, however she is dieting and exercising and rest of lipid panel looks good. Increase fiber.  - atorvastatin (LIPITOR) 40 MG tablet; Take 1 tablet (40 mg total) by mouth daily. Needs office visit prior to anymore refills.  Dispense: 90 tablet; Refill: 3  Metabolic syndrome/Elevated hemoglobin A1c Diet and exercise - HgB A1c  Snoring/sleep disturbance Patient fears her sleep disturbance is sleep apnea.  She had a test at [facility] few years ago.  She is interested in being screened again by them secondary to weight gain and increased symptoms.  Referral placed today.    Reviewed expectations re: course of current medical issues.  Discussed self-management of symptoms.  Outlined signs and symptoms indicating need for more acute intervention.  Patient verbalized understanding and all questions were answered.  Patient received an After-Visit Summary.   Note is dictated utilizing voice recognition software. Although note has been proof read prior to signing, occasional typographical errors still can be missed. If any questions arise, please do not hesitate to call for verification.   electronically signed by:  Howard Pouch, DO  [place]

## 2018-03-18 NOTE — Telephone Encounter (Signed)
Patient notified and verbalized understanding. 

## 2018-03-20 ENCOUNTER — Encounter: Payer: Self-pay | Admitting: Family Medicine

## 2018-06-17 ENCOUNTER — Ambulatory Visit: Payer: Commercial Managed Care - PPO | Admitting: Neurology

## 2018-07-02 ENCOUNTER — Ambulatory Visit (INDEPENDENT_AMBULATORY_CARE_PROVIDER_SITE_OTHER): Payer: Commercial Managed Care - PPO | Admitting: Neurology

## 2018-07-02 ENCOUNTER — Encounter: Payer: Self-pay | Admitting: Neurology

## 2018-07-02 VITALS — BP 129/88 | HR 69 | Ht 66.0 in | Wt 254.0 lb

## 2018-07-02 DIAGNOSIS — R519 Headache, unspecified: Secondary | ICD-10-CM | POA: Insufficient documentation

## 2018-07-02 DIAGNOSIS — R51 Headache: Secondary | ICD-10-CM | POA: Diagnosis not present

## 2018-07-02 DIAGNOSIS — R0683 Snoring: Secondary | ICD-10-CM | POA: Diagnosis not present

## 2018-07-02 DIAGNOSIS — E661 Drug-induced obesity: Secondary | ICD-10-CM

## 2018-07-02 DIAGNOSIS — Z6841 Body Mass Index (BMI) 40.0 and over, adult: Secondary | ICD-10-CM

## 2018-07-02 DIAGNOSIS — G478 Other sleep disorders: Secondary | ICD-10-CM | POA: Diagnosis not present
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
## 2018-07-23 ENCOUNTER — Other Ambulatory Visit (HOSPITAL_COMMUNITY): Payer: Self-pay | Admitting: Interventional Radiology

## 2018-07-23 DIAGNOSIS — I729 Aneurysm of unspecified site: Secondary | ICD-10-CM

## 2018-08-05 ENCOUNTER — Ambulatory Visit (HOSPITAL_COMMUNITY)
Admission: RE | Admit: 2018-08-05 | Discharge: 2018-08-05 | Disposition: A | Discharge status: Home / Self Care | Payer: Commercial Managed Care - PPO | Source: Ambulatory Visit | Attending: Interventional Radiology | Admitting: Interventional Radiology

## 2018-08-05 DIAGNOSIS — I729 Aneurysm of unspecified site: Secondary | ICD-10-CM | POA: Insufficient documentation

## 2018-08-07 ENCOUNTER — Telehealth (HOSPITAL_COMMUNITY): Payer: Self-pay | Admitting: Radiology

## 2018-08-07 NOTE — Telephone Encounter (Signed)
Called pt, told her next f/u will be MRA w/o contrast in 6 months time. Pt agrees with this plan of care. JM

## 2018-08-11 ENCOUNTER — Telehealth: Payer: Self-pay

## 2018-08-11 NOTE — Telephone Encounter (Signed)
We have attempted to call the patient two times to schedule sleep study.  Patient has been unavailable at the phone numbers we have on file and has not returned our calls.  At this point we will send a letter asking patient to please contact the sleep lab to schedule their sleep study.  If patient calls back we will schedule them for their sleep study. 

## 2018-08-18 ENCOUNTER — Encounter: Payer: Self-pay | Admitting: Family Medicine

## 2018-08-18 ENCOUNTER — Ambulatory Visit (INDEPENDENT_AMBULATORY_CARE_PROVIDER_SITE_OTHER): Payer: Commercial Managed Care - PPO | Admitting: Family Medicine

## 2018-08-18 VITALS — BP 112/68 | HR 85 | Temp 98.1°F | Resp 16 | Ht 66.0 in | Wt 253.0 lb

## 2018-08-18 DIAGNOSIS — J029 Acute pharyngitis, unspecified: Secondary | ICD-10-CM | POA: Diagnosis not present

## 2018-08-18 DIAGNOSIS — B349 Viral infection, unspecified: Secondary | ICD-10-CM

## 2018-08-18 DIAGNOSIS — J0110 Acute frontal sinusitis, unspecified: Secondary | ICD-10-CM | POA: Diagnosis not present

## 2018-08-18 LAB — POCT RAPID STREP A (OFFICE): Rapid Strep A Screen: NEGATIVE

## 2018-08-18 MED ORDER — HYDROCODONE-HOMATROPINE 5-1.5 MG/5ML PO SYRP: 5.0000 mL | ORAL_SOLUTION | Freq: Four times a day (QID) | ORAL | 0 refills | Status: DC | PRN

## 2018-08-18 MED ORDER — HYDROCODONE-HOMATROPINE 5-1.5 MG/5ML PO SYRP: 5.0000 mL | ORAL_SOLUTION | Freq: Four times a day (QID) | ORAL | 0 refills | Status: AC | PRN

## 2018-08-18 MED ORDER — AMOXICILLIN-POT CLAVULANATE 875-125 MG PO TABS: 1.0000 | ORAL_TABLET | Freq: Two times a day (BID) | ORAL | 0 refills | Status: DC

## 2018-08-18 NOTE — Progress Notes (Signed)
[name] , 02/14/1971, 47 y.o., female MRN: [id number] Patient Care Team    Relationship Specialty Notifications Start End  Ma Hillock, DO PCP - General Family Medicine  05/01/15   Sheralyn Boatman, MD Consulting Physician Psychiatry  11/27/15   Dan Humphreys  Nurse Practitioner  12/26/15   Molli Posey, MD Consulting Physician Obstetrics and Gynecology  09/13/16     Chief Complaint  Patient presents with  . Cough    sore throat, headache     Subjective: Pt presents for an OV with complaints of coughof 6 days duration.  Associated symptoms include sometimes cough is productive, sore throat, low grade fever, chills, frontal headache, nasal congestion. She denies nausea, wheezing, SOB, vomit or diarrhea. She took OTC sinus meds and tylenol use this morning.   Depression screen [facility] 2/9 03/17/2018 09/17/2017 01/22/2017 09/13/2016 11/23/2015  Decreased Interest 0 1 0 0 3  Down, Depressed, Hopeless 0 1 0 1 3  PHQ - 2 Score 0 2 0 1 6  Altered sleeping 1 0 - - 3  Tired, decreased energy 1 1 - - 3  Change in appetite - 1 - - 3  Feeling bad or failure about yourself  1 1 - - 3  Trouble concentrating 0 0 - - 2  Moving slowly or fidgety/restless 0 0 - - 2  Suicidal thoughts 0 0 - - 1  PHQ-9 Score 3 5 - - 23  Difficult doing work/chores Not difficult at all Not difficult at all - - Somewhat difficult  Some encounter information is confidential and restricted. Go to Review Flowsheets activity to see all data.    Allergies  Allergen Reactions  . Ace Inhibitors Swelling and Other (See Comments)    Angioedema    Social History   Tobacco Use  . Smoking status: Never Smoker  . Smokeless tobacco: Never Used  Substance Use Topics  . Alcohol use: Yes    Comment: 2-3 x weekly   Past Medical History:  Diagnosis Date  . Anxiety    takes Ativan daily as needed  . Arthritis   . Depression    psychiatry  . GERD (gastroesophageal reflux disease)   . Headache(784.0)   .  Hyperlipidemia    takes Atorvastatin daily  . Hypertension    takes Amlodipine and Propranolol daily  . Insomnia    takes Trazodone at bedtime  . PONV (postoperative nausea and vomiting)   . 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 BRAIN SURGERY     aneurysm coiling  . DILATION AND CURETTAGE OF UTERUS  2009  . LAPAROTOMY    . MYOMECTOMY  05/16/2011   Procedure: MYOMECTOMY;  Surgeon: Margarette Asal;  Location: [facility];  Service: Gynecology;  Laterality: N/A;  abdominal  . RADIOLOGY WITH ANESTHESIA N/A 02/18/2013   Procedure: RADIOLOGY WITH ANESTHESIA;  Surgeon: Rob Hickman, MD;  Location: [facility];  Service: Radiology;  Laterality: N/A;  . RADIOLOGY WITH ANESTHESIA N/A 01/26/2014   Procedure: RADIOLOGY WITH ANESTHESIA;  Surgeon: Rob Hickman, MD;  Location: [facility];  Service: Radiology;  Laterality: N/A;  . RADIOLOGY WITH ANESTHESIA N/A 07/25/2014   Procedure: RADIOLOGY WITH ANESTHESIA;  Surgeon: Rob Hickman, MD;  Location: [facility];  Service: Radiology;  Laterality: N/A;   Family History  Problem Relation Age of Onset  . Heart disease Mother 37  cad  . Lupus Mother   . COPD Mother   . Arthritis Mother        rheumatoid  . AVM Mother   . Diabetes Mother   . Bipolar disorder Mother   . Drug abuse Mother   . Fibromyalgia Mother   . Diabetes Father   . Cancer Father        thyroid, lung  . Depression Sister   . Diabetes Sister   . Hyperlipidemia Sister   . Hypertension Sister   . Cancer Paternal Grandfather        colon  . Alcohol abuse Paternal Uncle   . Drug abuse Maternal Grandfather   . Alcohol abuse Maternal Grandfather    Allergies as of 08/18/2018      Reactions   Ace Inhibitors Swelling, Other (See Comments)   Angioedema       Medication List       Accurate as of August 18, 2018 11:26 AM. Always use your most recent med list.          amLODipine 10 MG tablet Commonly known as:  NORVASC Take 1 tablet (10 mg total) by mouth daily. Needs office visit prior to anymore refills.   aspirin EC 325 MG tablet Take 325 mg by mouth daily.   atorvastatin 40 MG tablet Commonly known as:  LIPITOR Take 1 tablet (40 mg total) by mouth daily. Needs office visit prior to anymore refills.   baclofen 10 MG tablet Commonly known as:  LIORESAL Take 1 tablet by mouth 3 (three) times daily as needed.   clonazePAM 0.5 MG tablet Commonly known as:  KLONOPIN Take 0.5-1 mg by mouth 2 (two) times daily. 0.5mg  in the mornnig, 1mg  in the evening   DULoxetine 30 MG capsule Commonly known as:  CYMBALTA Take 90 mg by mouth daily.   fenofibrate 48 MG tablet Commonly known as:  TRICOR Take 1 tablet (48 mg total) by mouth daily.   FISH OIL PO Take 1 capsule by mouth daily.   furosemide 20 MG tablet Commonly known as:  LASIX 1/2-1  tab once daily PRN   lithium carbonate 300 MG CR tablet Commonly known as:  LITHOBID Take 300 mg by mouth 2 (two) times daily.   multivitamin with minerals Tabs tablet Take 1 tablet by mouth daily.   norgestimate-ethinyl estradiol 0.25-35 MG-MCG tablet Commonly known as:  ORTHO-CYCLEN,SPRINTEC,PREVIFEM Take 1 tablet by mouth daily.   prochlorperazine 10 MG tablet Commonly known as:  COMPAZINE Take 1 tablet by mouth 3 (three) times daily as needed.   propranolol ER 80 MG 24 hr capsule Commonly known as:  INDERAL LA Take 1 capsule (80 mg total) by mouth at bedtime. Needs office visit prior to any additional refills   zolpidem 12.5 MG CR tablet Commonly known as:  AMBIEN CR Take 12.5 mg by mouth at bedtime.       All past medical history, surgical history, allergies, family history, immunizations andmedications were updated in the EMR today and reviewed under the history and medication portions of their EMR.     ROS: Negative, with the exception of above mentioned in HPI   Objective:  BP  112/68 (BP Location: Left Arm, Patient Position: Sitting, Cuff Size: Normal)   Pulse 85   Temp 98.1 F (36.7 C) (Oral)   Resp 16   Ht 5\' 6"  (1.676 m)   Wt 253 lb (114.8 kg)   SpO2 98%   BMI 40.84 kg/m  Body mass index is  40.84 kg/m. Gen: Afebrile. No acute distress. Nontoxic in appearance, well developed, well nourished.  HENT: AT. . Bilateral TM visualized bilateral fullness. MMM, no oral lesions. Bilateral nares with erythema, drainage and swelling. Throat without erythema or exudates. Mild cough. frontal sinus TTP.  Eyes:Pupils Equal Round Reactive to light, Extraocular movements intact,  Conjunctiva without redness, discharge or icterus. Neck/lymp/endocrine: Supple, bilateral ant cervical  lymphadenopathy CV: RRR, no edema Chest: CTAB, no wheeze or crackles. Good air movement, normal resp effort.  Abd: Soft. NTND. BS presetn. no Masses palpated. No rebound or guarding. Skin: no rashes, purpura or petechiae.  Neuro:  Normal gait. PERLA. EOMi. Alert. Oriented x3 Psych: Normal affect, dress and demeanor. Normal speech. Normal thought content and judgment.  No exam data present No results found. Results for orders placed or performed in visit on 08/18/18 (from the past 24 hour(s))  POCT rapid strep A     Status: Normal   Collection Time: 08/18/18 11:22 AM  Result Value Ref Range   Rapid Strep A Screen Negative Negative    Assessment/Plan: CYBILL URIEGAS is a 47 y.o. female present for OV for  Acute frontal sinusitis, recurrence not specified Rest, hydrate.  Rapid strep negative + flonase, mucinex (DM if cough),  nasal saline flushes 2-3x a day.  augmentin prescribed, take until completed.  Hycodan cough syrup. No refills.  If cough present it can last up to 6-8 weeks.  F/U 2 weeks of not improved.    Reviewed expectations re: course of current medical issues.  Discussed self-management of symptoms.  Outlined signs and symptoms indicating need for more acute  intervention.  Patient verbalized understanding and all questions were answered.  Patient received an After-Visit Summary.    Orders Placed This Encounter  Procedures  . POCT rapid strep A     Note is dictated utilizing voice recognition software. Although note has been proof read prior to signing, occasional typographical errors still can be missed. If any questions arise, please do not hesitate to call for verification.   electronically signed by:  Howard Pouch, DO  [place]

## 2018-08-18 NOTE — Patient Instructions (Addendum)
Rest, hydrate.  Rapid strep negative + flonase, mucinex (DM if cough),  nasal saline flushes 2-3x a day.  augmentin prescribed, take until completed.  Hycodan cough syrup. No refills.  If cough present it can last up to 6-8 weeks.  F/U 2 weeks of not improved.      Sinusitis, Adult Sinusitis is soreness and inflammation of your sinuses. Sinuses are hollow spaces in the bones around your face. They are located:  Around your eyes.  In the middle of your forehead.  Behind your nose.  In your cheekbones.  Your sinuses and nasal passages are lined with a stringy fluid (mucus). Mucus normally drains out of your sinuses. When your nasal tissues get inflamed or swollen, the mucus can get trapped or blocked so air cannot flow through your sinuses. This lets bacteria, viruses, and funguses grow, and that leads to infection. Follow these instructions at home: Medicines  Take, use, or apply over-the-counter and prescription medicines only as told by your doctor. These may include nasal sprays.  If you were prescribed an antibiotic medicine, take it as told by your doctor. Do not stop taking the antibiotic even if you start to feel better. Hydrate and Humidify  Drink enough water to keep your pee (urine) clear or pale yellow.  Use a cool mist humidifier to keep the humidity level in your home above 50%.  Breathe in steam for 10-15 minutes, 3-4 times a day or as told by your doctor. You can do this in the bathroom while a hot shower is running.  Try not to spend time in cool or dry air. Rest  Rest as much as possible.  Sleep with your head raised (elevated).  Make sure to get enough sleep each night. General instructions  Put a warm, moist washcloth on your face 3-4 times a day or as told by your doctor. This will help with discomfort.  Wash your hands often with soap and water. If there is no soap and water, use hand sanitizer.  Do not smoke. Avoid being around people who are  smoking (secondhand smoke).  Keep all follow-up visits as told by your doctor. This is important. Contact a doctor if:  You have a fever.  Your symptoms get worse.  Your symptoms do not get better within 10 days. Get help right away if:  You have a very bad headache.  You cannot stop throwing up (vomiting).  You have pain or swelling around your face or eyes.  You have trouble seeing.  You feel confused.  Your neck is stiff.  You have trouble breathing. This information is not intended to replace advice given to you by your health care provider. Make sure you discuss any questions you have with your health care provider. Document Released: 02/05/2008 Document Revised: 04/14/2016 Document Reviewed: 06/14/2015 Elsevier Interactive Patient Education  [employer].

## 2018-09-04 ENCOUNTER — Other Ambulatory Visit: Payer: Self-pay

## 2018-09-04 ENCOUNTER — Other Ambulatory Visit: Payer: Self-pay | Admitting: Emergency Medicine

## 2018-09-04 ENCOUNTER — Ambulatory Visit: Payer: Self-pay | Admitting: Family Medicine

## 2018-09-04 ENCOUNTER — Ambulatory Visit (INDEPENDENT_AMBULATORY_CARE_PROVIDER_SITE_OTHER): Payer: Commercial Managed Care - PPO | Admitting: Family Medicine

## 2018-09-04 ENCOUNTER — Encounter: Payer: Self-pay | Admitting: Family Medicine

## 2018-09-04 VITALS — BP 116/83 | HR 74 | Temp 98.1°F | Resp 16 | Ht 66.0 in | Wt 250.0 lb

## 2018-09-04 DIAGNOSIS — J0100 Acute maxillary sinusitis, unspecified: Secondary | ICD-10-CM

## 2018-09-04 DIAGNOSIS — E785 Hyperlipidemia, unspecified: Secondary | ICD-10-CM

## 2018-09-04 MED ORDER — DOXYCYCLINE HYCLATE 100 MG PO TABS: 100.0000 mg | ORAL_TABLET | Freq: Two times a day (BID) | ORAL | 0 refills | Status: DC

## 2018-09-04 MED ORDER — FENOFIBRATE 48 MG PO TABS: 48.0000 mg | ORAL_TABLET | Freq: Every day | ORAL | 1 refills | Status: DC

## 2018-09-04 NOTE — Progress Notes (Signed)
[name] , [birth year], 48 y.o., female MRN: [id number] Patient Care Team    Relationship Specialty Notifications Start End  Ma Hillock, DO PCP - General Family Medicine  05/01/15   Sheralyn Boatman, MD Consulting Physician Psychiatry  11/27/15   Dan Humphreys  Nurse Practitioner  12/26/15   Molli Posey, MD Consulting Physician Obstetrics and Gynecology  09/13/16     Chief Complaint  Patient presents with  . URI     Subjective: Pt presents for an OV with complaints of nasal congestion, cough with yellow sputum, sore throat , headache and sinus pressure of 1 week  duration.  Associated symptoms include low grade fever and chills.  She was treated with augmentin last month for her sinus and she states she did have complete recovery- however many people at work are ill and she believes she caught something new. Her symptoms are worsening.    Depression screen [facility] 2/9 03/17/2018 09/17/2017 01/22/2017 09/13/2016 11/23/2015  Decreased Interest 0 1 0 0 3  Down, Depressed, Hopeless 0 1 0 1 3  PHQ - 2 Score 0 2 0 1 6  Altered sleeping 1 0 - - 3  Tired, decreased energy 1 1 - - 3  Change in appetite - 1 - - 3  Feeling bad or failure about yourself  1 1 - - 3  Trouble concentrating 0 0 - - 2  Moving slowly or fidgety/restless 0 0 - - 2  Suicidal thoughts 0 0 - - 1  PHQ-9 Score 3 5 - - 23  Difficult doing work/chores Not difficult at all Not difficult at all - - Somewhat difficult  Some encounter information is confidential and restricted. Go to Review Flowsheets activity to see all data.    Allergies  Allergen Reactions  . Ace Inhibitors Swelling and Other (See Comments)    Angioedema    Social History   Tobacco Use  . Smoking status: Never Smoker  . Smokeless tobacco: Never Used  Substance Use Topics  . Alcohol use: Yes    Comment: 2-3 x weekly   Past Medical History:  Diagnosis Date  . Anxiety    takes Ativan daily as needed  . Arthritis   . Depression     psychiatry  . GERD (gastroesophageal reflux disease)   . Headache(784.0)   . Hyperlipidemia    takes Atorvastatin daily  . Hypertension    takes Amlodipine and Propranolol daily  . Insomnia    takes Trazodone at bedtime  . PONV (postoperative nausea and vomiting)   . 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 BRAIN SURGERY     aneurysm coiling  . DILATION AND CURETTAGE OF UTERUS  2009  . LAPAROTOMY    . MYOMECTOMY  05/16/2011   Procedure: MYOMECTOMY;  Surgeon: Margarette Asal;  Location: [facility];  Service: Gynecology;  Laterality: N/A;  abdominal  . RADIOLOGY WITH ANESTHESIA N/A 02/18/2013   Procedure: RADIOLOGY WITH ANESTHESIA;  Surgeon: Rob Hickman, MD;  Location: [facility];  Service: Radiology;  Laterality: N/A;  . RADIOLOGY WITH ANESTHESIA N/A 01/26/2014   Procedure: RADIOLOGY WITH ANESTHESIA;  Surgeon: Rob Hickman, MD;  Location: [facility];  Service: Radiology;  Laterality: N/A;  . RADIOLOGY WITH ANESTHESIA N/A 07/25/2014   Procedure: RADIOLOGY WITH ANESTHESIA;  Surgeon: Rob Hickman, MD;  Location: [facility];  Service: Radiology;  Laterality:  N/A;   Family History  Problem Relation Age of Onset  . Heart disease Mother 6       cad  . Lupus Mother   . COPD Mother   . Arthritis Mother        rheumatoid  . AVM Mother   . Diabetes Mother   . Bipolar disorder Mother   . Drug abuse Mother   . Fibromyalgia Mother   . Diabetes Father   . Cancer Father        thyroid, lung  . Depression Sister   . Diabetes Sister   . Hyperlipidemia Sister   . Hypertension Sister   . Cancer Paternal Grandfather        colon  . Alcohol abuse Paternal Uncle   . Drug abuse Maternal Grandfather   . Alcohol abuse Maternal Grandfather    Allergies as of 09/04/2018      Reactions   Ace Inhibitors Swelling, Other (See Comments)   Angioedema       Medication List       Accurate  as of September 04, 2018 10:39 AM. Always use your most recent med list.        amLODipine 10 MG tablet Commonly known as:  NORVASC Take 1 tablet (10 mg total) by mouth daily. Needs office visit prior to anymore refills.   aspirin EC 325 MG tablet Take 325 mg by mouth daily.   atorvastatin 40 MG tablet Commonly known as:  LIPITOR Take 1 tablet (40 mg total) by mouth daily. Needs office visit prior to anymore refills.   baclofen 10 MG tablet Commonly known as:  LIORESAL Take 1 tablet by mouth 3 (three) times daily as needed.   clonazePAM 0.5 MG tablet Commonly known as:  KLONOPIN Take 0.5-1 mg by mouth 2 (two) times daily. 0.5mg  in the mornnig, 1mg  in the evening   DULoxetine 30 MG capsule Commonly known as:  CYMBALTA Take 90 mg by mouth daily.   fenofibrate 48 MG tablet Commonly known as:  TRICOR Take 1 tablet (48 mg total) by mouth daily.   FISH OIL PO Take 1 capsule by mouth daily.   furosemide 20 MG tablet Commonly known as:  LASIX 1/2-1  tab once daily PRN   lithium carbonate 300 MG CR tablet Commonly known as:  LITHOBID Take 300 mg by mouth 2 (two) times daily.   multivitamin with minerals Tabs tablet Take 1 tablet by mouth daily.   norgestimate-ethinyl estradiol 0.25-35 MG-MCG tablet Commonly known as:  ORTHO-CYCLEN,SPRINTEC,PREVIFEM Take 1 tablet by mouth daily.   prochlorperazine 10 MG tablet Commonly known as:  COMPAZINE Take 1 tablet by mouth 3 (three) times daily as needed.   propranolol ER 80 MG 24 hr capsule Commonly known as:  INDERAL LA Take 1 capsule (80 mg total) by mouth at bedtime. Needs office visit prior to any additional refills   zolpidem 12.5 MG CR tablet Commonly known as:  AMBIEN CR Take 12.5 mg by mouth at bedtime.       All past medical history, surgical history, allergies, family history, immunizations andmedications were updated in the EMR today and reviewed under the history and medication portions of their EMR.     ROS:  Negative, with the exception of above mentioned in HPI   Objective:  BP 116/83   Pulse 74   Temp 98.1 F (36.7 C) (Oral)   Resp 16   Ht 5\' 6"  (1.676 m)   Wt 250 lb (113.4 kg)  SpO2 94%   BMI 40.35 kg/m  Body mass index is 40.35 kg/m. Gen: Afebrile. No acute distress. Nontoxic in appearance, well developed, well nourished. HENT: AT. [place]. Bilateral TM visualized mild fullness bilateral. MMM, no oral lesions. Bilateral nares with erythema, drainage present. Throat without erythema or exudates. No cough present. TTP rigt max sinus.  Eyes:Pupils Equal Round Reactive to light, Extraocular movements intact,  Conjunctiva without redness, discharge or icterus. Neck/lymp/endocrine: Supple,left anterior lymphadenopathy CV: RRR no murmur, no edema Chest: CTAB, no wheeze or crackles. Good air movement, normal resp effort.  Neuro:  Normal gait. PERLA. EOMi. Alert. Oriented x3  No exam data present No results found. No results found for this or any previous visit (from the past 24 hour(s)).  Assessment/Plan: [name] is a 48 y.o. female present for OV for  Acute maxillary sinusitis, recurrence not specified Rest, hydrate.  Continue + flonase, coricidin doxy prescribed, take until completed.  If cough present it can last up to 6-8 weeks.  F/U 2 weeks of not improved.    Reviewed expectations re: course of current medical issues.  Discussed self-management of symptoms.  Outlined signs and symptoms indicating need for more acute intervention.  Patient verbalized understanding and all questions were answered.  Patient received an After-Visit Summary.    No orders of the defined types were placed in this encounter.    Note is dictated utilizing voice recognition software. Although note has been proof read prior to signing, occasional typographical errors still can be missed. If any questions arise, please do not hesitate to call for verification.   electronically signed  by:  Howard Pouch, DO  [place]

## 2018-09-04 NOTE — Patient Instructions (Addendum)
Rest, hydrate.  Continue + flonase, coricidin doxy prescribed, take until completed.  If cough present it can last up to 6-8 weeks.  F/U 2 weeks of not improved.    Sinusitis, Adult Sinusitis is soreness and swelling (inflammation) of your sinuses. Sinuses are hollow spaces in the bones around your face. They are located:  Around your eyes.  In the middle of your forehead.  Behind your nose.  In your cheekbones. Your sinuses and nasal passages are lined with a fluid called mucus. Mucus drains out of your sinuses. Swelling can trap mucus in your sinuses. This lets germs (bacteria, virus, or fungus) grow, which leads to infection. Most of the time, this condition is caused by a virus. What are the causes? This condition is caused by:  Allergies.  Asthma.  Germs.  Things that block your nose or sinuses.  Growths in the nose (nasal polyps).  Chemicals or irritants in the air.  Fungus (rare). What increases the risk? You are more likely to develop this condition if:  You have a weak body defense system (immune system).  You do a lot of swimming or diving.  You use nasal sprays too much.  You smoke. What are the signs or symptoms? The main symptoms of this condition are pain and a feeling of pressure around the sinuses. Other symptoms include:  Stuffy nose (congestion).  Runny nose (drainage).  Swelling and warmth in the sinuses.  Headache.  Toothache.  A cough that may get worse at night.  Mucus that collects in the throat or the back of the nose (postnasal drip).  Being unable to smell and taste.  Being very tired (fatigue).  A fever.  Sore throat.  Bad breath. How is this diagnosed? This condition is diagnosed based on:  Your symptoms.  Your medical history.  A physical exam.  Tests to find out if your condition is short-term (acute) or long-term (chronic). Your doctor may: ? Check your nose for growths (polyps). ? Check your sinuses using  a tool that has a light (endoscope). ? Check for allergies or germs. ? Do imaging tests, such as an MRI or CT scan. How is this treated? Treatment for this condition depends on the cause and whether it is short-term or long-term.  If caused by a virus, your symptoms should go away on their own within 10 days. You may be given medicines to relieve symptoms. They include: ? Medicines that shrink swollen tissue in the nose. ? Medicines that treat allergies (antihistamines). ? A spray that treats swelling of the nostrils. ? Rinses that help get rid of thick mucus in your nose (nasal saline washes).  If caused by bacteria, your doctor may wait to see if you will get better without treatment. You may be given antibiotic medicine if you have: ? A very bad infection. ? A weak body defense system.  If caused by growths in the nose, you may need to have surgery. Follow these instructions at home: Medicines  Take, use, or apply over-the-counter and prescription medicines only as told by your doctor. These may include nasal sprays.  If you were prescribed an antibiotic medicine, take it as told by your doctor. Do not stop taking the antibiotic even if you start to feel better. Hydrate and humidify   Drink enough water to keep your pee (urine) pale yellow.  Use a cool mist humidifier to keep the humidity level in your home above 50%.  Breathe in steam for 10-15 minutes, 3-4  times a day, or as told by your doctor. You can do this in the bathroom while a hot shower is running.  Try not to spend time in cool or dry air. Rest  Rest as much as you can.  Sleep with your head raised (elevated).  Make sure you get enough sleep each night. General instructions   Put a warm, moist washcloth on your face 3-4 times a day, or as often as told by your doctor. This will help with discomfort.  Wash your hands often with soap and water. If there is no soap and water, use hand sanitizer.  Do not  smoke. Avoid being around people who are smoking (secondhand smoke).  Keep all follow-up visits as told by your doctor. This is important. Contact a doctor if:  You have a fever.  Your symptoms get worse.  Your symptoms do not get better within 10 days. Get help right away if:  You have a very bad headache.  You cannot stop throwing up (vomiting).  You have very bad pain or swelling around your face or eyes.  You have trouble seeing.  You feel confused.  Your neck is stiff.  You have trouble breathing. Summary  Sinusitis is swelling of your sinuses. Sinuses are hollow spaces in the bones around your face.  This condition is caused by tissues in your nose that become inflamed or swollen. This traps germs. These can lead to infection.  If you were prescribed an antibiotic medicine, take it as told by your doctor. Do not stop taking it even if you start to feel better.  Keep all follow-up visits as told by your doctor. This is important. This information is not intended to replace advice given to you by your health care provider. Make sure you discuss any questions you have with your health care provider. Document Released: 02/05/2008 Document Revised: 01/19/2018 Document Reviewed: 01/19/2018 Elsevier Interactive Patient Education  2019 [employer].

## 2018-09-18 ENCOUNTER — Encounter: Payer: Self-pay | Admitting: Family Medicine

## 2018-09-23 ENCOUNTER — Ambulatory Visit (INDEPENDENT_AMBULATORY_CARE_PROVIDER_SITE_OTHER): Payer: Commercial Managed Care - PPO | Admitting: Family Medicine

## 2018-09-23 ENCOUNTER — Encounter: Payer: Self-pay | Admitting: Family Medicine

## 2018-09-23 VITALS — BP 108/72 | HR 64 | Temp 98.2°F | Resp 16 | Ht 65.5 in | Wt 252.0 lb

## 2018-09-23 DIAGNOSIS — I671 Cerebral aneurysm, nonruptured: Secondary | ICD-10-CM

## 2018-09-23 DIAGNOSIS — Z131 Encounter for screening for diabetes mellitus: Secondary | ICD-10-CM | POA: Diagnosis not present

## 2018-09-23 DIAGNOSIS — E785 Hyperlipidemia, unspecified: Secondary | ICD-10-CM | POA: Diagnosis not present

## 2018-09-23 DIAGNOSIS — I10 Essential (primary) hypertension: Secondary | ICD-10-CM | POA: Diagnosis not present

## 2018-09-23 DIAGNOSIS — Z0000 Encounter for general adult medical examination without abnormal findings: Secondary | ICD-10-CM | POA: Diagnosis not present

## 2018-09-23 LAB — HEMOGLOBIN A1C: Hgb A1c MFr Bld: 5.4 % (ref 4.6–6.5)

## 2018-09-23 LAB — COMPREHENSIVE METABOLIC PANEL
ALT: 12 U/L (ref 0–35)
AST: 15 U/L (ref 0–37)
Albumin: 4.2 g/dL (ref 3.5–5.2)
Alkaline Phosphatase: 59 U/L (ref 39–117)
BUN: 15 mg/dL (ref 6–23)
CO2: 29 mEq/L (ref 19–32)
Calcium: 9.7 mg/dL (ref 8.4–10.5)
Chloride: 100 mEq/L (ref 96–112)
Creatinine, Ser: 1.11 mg/dL (ref 0.40–1.20)
GFR: 53.13 mL/min — ABNORMAL LOW (ref >60.00)
Glucose, Bld: 108 mg/dL — ABNORMAL HIGH (ref 70–99)
Potassium: 4.8 mEq/L (ref 3.5–5.1)
Sodium: 137 mEq/L (ref 135–145)
Total Bilirubin: 0.4 mg/dL (ref 0.2–1.2)
Total Protein: 7.1 g/dL (ref 6.0–8.3)

## 2018-09-23 LAB — LIPID PANEL
Cholesterol: 197 mg/dL (ref 0–200)
HDL: 70.3 mg/dL (ref >39.00)
LDL Cholesterol: 91 mg/dL (ref 0–99)
NonHDL: 126.95
Total CHOL/HDL Ratio: 3
Triglycerides: 180 mg/dL — ABNORMAL HIGH (ref 0.0–149.0)
VLDL: 36 mg/dL (ref 0.0–40.0)

## 2018-09-23 LAB — CBC WITH DIFFERENTIAL/PLATELET
Basophils Absolute: 0.1 10*3/uL (ref 0.0–0.1)
Basophils Relative: 0.7 % (ref 0.0–3.0)
Eosinophils Absolute: 0.2 10*3/uL (ref 0.0–0.7)
Eosinophils Relative: 1.6 % (ref 0.0–5.0)
HCT: 39.5 % (ref 36.0–46.0)
Hemoglobin: 13.2 g/dL (ref 12.0–15.0)
Lymphocytes Relative: 19 % (ref 12.0–46.0)
Lymphs Abs: 2.1 10*3/uL (ref 0.7–4.0)
MCHC: 33.4 g/dL (ref 30.0–36.0)
MCV: 96 fl (ref 78.0–100.0)
Monocytes Absolute: 0.5 10*3/uL (ref 0.1–1.0)
Monocytes Relative: 4.9 % (ref 3.0–12.0)
Neutro Abs: 8 10*3/uL — ABNORMAL HIGH (ref 1.4–7.7)
Neutrophils Relative %: 73.8 % (ref 43.0–77.0)
Platelets: 405 10*3/uL — ABNORMAL HIGH (ref 150.0–400.0)
RBC: 4.12 Mil/uL (ref 3.87–5.11)
RDW: 12.3 % (ref 11.5–15.5)
WBC: 10.8 10*3/uL — ABNORMAL HIGH (ref 4.0–10.5)

## 2018-09-23 LAB — TSH: TSH: 4.06 u[IU]/mL (ref 0.35–4.50)

## 2018-09-23 MED ORDER — ATORVASTATIN CALCIUM 40 MG PO TABS: 40.0000 mg | ORAL_TABLET | Freq: Every day | ORAL | 3 refills | Status: DC

## 2018-09-23 MED ORDER — AMLODIPINE BESYLATE 10 MG PO TABS: 10.0000 mg | ORAL_TABLET | Freq: Every day | ORAL | 1 refills | Status: DC

## 2018-09-23 MED ORDER — FUROSEMIDE 20 MG PO TABS: ORAL_TABLET | ORAL | 3 refills | Status: DC

## 2018-09-23 MED ORDER — PROPRANOLOL HCL ER 80 MG PO CP24: 80.0000 mg | ORAL_CAPSULE | Freq: Every day | ORAL | 1 refills | Status: DC

## 2018-09-23 NOTE — Patient Instructions (Signed)

## 2018-09-23 NOTE — Progress Notes (Signed)
Patient ID: [name], female  DOB: 04-Sep-1970, 48 y.o.   MRN: [id number] Patient Care Team    Relationship Specialty Notifications Start End  Ma Hillock, DO PCP - General Family Medicine  05/01/15   Sheralyn Boatman, MD Consulting Physician Psychiatry  11/27/15   Dan Humphreys  Nurse Practitioner  12/26/15   Molli Posey, MD Consulting Physician Obstetrics and Gynecology  09/13/16     Chief Complaint  Patient presents with  . Annual Exam    Pt is fasting.    Subjective:  [name] is a 48 y.o.  Female  present for CPE. All past medical history, surgical history, allergies, family history, immunizations, medications and social history were updated in the electronic medical record today. All recent labs, ED visits and hospitalizations within the last year were reviewed.  Hypertension/Hyperlipidemia/LE edema:   She reports appliance with her Inderal LA, Lipitor and amlodipine. She has started the lasix 20 mg QD PRN-not taking often, with only mild improvement in LE edema- worse in warmer months. Blood pressures ranges at home WNL ranges. Patient denies chest pain, shortness of breath, dizziness or lower extremity edema.  Pt is  prescribed statin, fenofibrate, and taking fish oil 3600 mg daily.  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 Stable- scans q 6 mos.  BMP:09/17/2017 within normal limits CBC: 09/17/2017 within normal limits Lipids: 03/17/2018 cholesterol 179, HDL 61, LDL 81, triglycerides 184 (started tricor after) TSH: Diet: low salt  Exercise: exercising routinely RF: HLD, stable coiled aneurysm, HTN, Fhx  Health maintenance: updated 09/23/18 Colonoscopy: PGF colon cancer history (80). Screen at 52.  Mammogram: 04/2018, normal, has completed at OB/GYN office. Per pt requesting records.  Cervical cancer screening: 06/20/2017; Dr. Matthew Saras Patient's last menstrual period was  09/02/2018. Immunizations: tdap 09/2016 (due), Influenza 06/2018(encouraged yearly) Infectious disease screening: HIV completed  DEXA: N/A Assistive device: none Oxygen HKF:EXMD Patient has a Dental home. Hospitalizations/ED visits: reviewed   Depression screen [facility] 2/9 09/23/2018 03/17/2018 09/17/2017 01/22/2017 09/13/2016  Decreased Interest 1 0 1 0 0  Down, Depressed, Hopeless 1 0 1 0 1  PHQ - 2 Score 2 0 2 0 1  Altered sleeping 1 1 0 - -  Tired, decreased energy '1 1 1 ' - -  Change in appetite 0 - 1 - -  Feeling bad or failure about yourself  '1 1 1 ' - -  Trouble concentrating 0 0 0 - -  Moving slowly or fidgety/restless 0 0 0 - -  Suicidal thoughts 0 0 0 - -  PHQ-9 Score '5 3 5 ' - -  Difficult doing work/chores Somewhat difficult Not difficult at all Not difficult at all - -  Some encounter information is confidential and restricted. Go to Review Flowsheets activity to see all data.   GAD 7 : Generalized Anxiety Score 09/23/2018 03/17/2018 09/17/2017  Nervous, Anxious, on Edge 1 0 1  Control/stop worrying 0 0 0  Worry too much - different things 1 0 3  Trouble relaxing 0 0 1  Restless 0 1 0  Easily annoyed or irritable 0 1 0  Afraid - awful might happen 1 0 0  Total GAD 7 Score '3 2 5  ' Anxiety Difficulty Somewhat difficult Not difficult at all Not difficult at all  Some encounter information is confidential and restricted. Go to Review Flowsheets activity to see all data.    Immunization History  Administered  Date(s) Administered  . Influenza Split 05/18/2011  . Influenza,inj,Quad PF,6+ Mos 07/09/2013, 05/04/2014, 05/11/2015, 05/29/2018  . Influenza-Unspecified 06/20/2017  . Td 09/02/2005  . Tdap 09/13/2016    Past Medical History:  Diagnosis Date  . Anxiety    takes Ativan daily as needed  . Arthritis   . Depression    psychiatry  . GERD (gastroesophageal reflux disease)   . Headache(784.0)   . Hyperlipidemia    takes Atorvastatin daily  . Hypertension    takes  Amlodipine and Propranolol daily  . Insomnia    takes Trazodone at bedtime  . PONV (postoperative nausea and vomiting)   . 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 Ace Inhibitors Swelling and Other (See Comments)    Angioedema    Past Surgical History:  Procedure Laterality Date  . BRAIN SURGERY     aneurysm coiling  . DILATION AND CURETTAGE OF UTERUS  2009  . LAPAROTOMY    . MYOMECTOMY  05/16/2011   Procedure: MYOMECTOMY;  Surgeon: Margarette Asal;  Location: [facility];  Service: Gynecology;  Laterality: N/A;  abdominal  . RADIOLOGY WITH ANESTHESIA N/A 02/18/2013   Procedure: RADIOLOGY WITH ANESTHESIA;  Surgeon: Rob Hickman, MD;  Location: [place];  Service: Radiology;  Laterality: N/A;  . RADIOLOGY WITH ANESTHESIA N/A 01/26/2014   Procedure: RADIOLOGY WITH ANESTHESIA;  Surgeon: Rob Hickman, MD;  Location: [place];  Service: Radiology;  Laterality: N/A;  . RADIOLOGY WITH ANESTHESIA N/A 07/25/2014   Procedure: RADIOLOGY WITH ANESTHESIA;  Surgeon: Rob Hickman, MD;  Location: [place];  Service: Radiology;  Laterality: N/A;   Family History  Problem Relation Age of Onset  . Heart disease Mother 69       cad  . Lupus Mother   . COPD Mother   . Arthritis Mother        rheumatoid  . AVM Mother   . Diabetes Mother   . Bipolar disorder Mother   . Drug abuse Mother   . Fibromyalgia Mother   . Diabetes Father   . Cancer Father        thyroid, lung  . Depression Sister   . Diabetes Sister   . Hyperlipidemia Sister   . Hypertension Sister   . Cancer Paternal Grandfather        colon  . Alcohol abuse Paternal Uncle   . Drug abuse Maternal Grandfather   . Alcohol abuse Maternal Grandfather    Social History   Socioeconomic History  . Marital status: Married    Spouse name: Not on file  . Number of children: 0  . Years of education: 78  . Highest education level: Not on file   Occupational History    Employer: [employer]  Social Needs  . Financial resource strain: Not on file  . Food insecurity:    Worry: Not on file    Inability: Not on file  . Transportation needs:    Medical: Not on file    Non-medical: Not on file  Tobacco Use  . Smoking status: Never Smoker  . Smokeless tobacco: Never Used  Substance and Sexual Activity  . Alcohol use: Yes    Comment: 2-3 x weekly  . Drug use: No  . Sexual activity: Yes    Birth control/protection: Pill  Lifestyle  . Physical activity:    Days per week: Not on file  Minutes per session: Not on file  . Stress: Not on file  Relationships  . Social connections:    Talks on phone: Not on file    Gets together: Not on file    Attends religious service: Not on file    Active member of club or organization: Not on file    Attends meetings of clubs or organizations: Not on file    Relationship status: Not on file  . Intimate partner violence:    Fear of current or ex partner: Not on file    Emotionally abused: Not on file    Physically abused: Not on file    Forced sexual activity: Not on file  Other Topics Concern  . Not on file  Social History Narrative   Lives with husband   Caffeine- 2-12 oz cans daily   Allergies as of 09/23/2018      Reactions   Ace Inhibitors Swelling, Other (See Comments)   Angioedema       Medication List       Accurate as of September 23, 2018  8:44 AM. Always use your most recent med list.        amLODipine 10 MG tablet Commonly known as:  NORVASC Take 1 tablet (10 mg total) by mouth daily. Needs office visit prior to anymore refills.   aspirin EC 325 MG tablet Take 325 mg by mouth daily.   atorvastatin 40 MG tablet Commonly known as:  LIPITOR Take 1 tablet (40 mg total) by mouth daily. Needs office visit prior to anymore refills.   baclofen 10 MG tablet Commonly known as:  LIORESAL Take 1 tablet by mouth 3 (three) times daily as needed.   clonazePAM  0.5 MG tablet Commonly known as:  KLONOPIN Take 0.5-1 mg by mouth 2 (two) times daily. 0.38m in the mornnig, 164min the evening   DULoxetine 30 MG capsule Commonly known as:  CYMBALTA Take 90 mg by mouth daily.   fenofibrate 48 MG tablet Commonly known as:  TRICOR Take 1 tablet (48 mg total) by mouth daily.   FISH OIL PO Take 1 capsule by mouth daily.   furosemide 20 MG tablet Commonly known as:  LASIX 1/2-1  tab once daily PRN   lithium carbonate 300 MG CR tablet Commonly known as:  LITHOBID Take 300 mg by mouth 2 (two) times daily.   multivitamin with minerals Tabs tablet Take 1 tablet by mouth daily.   norgestimate-ethinyl estradiol 0.25-35 MG-MCG tablet Commonly known as:  ORTHO-CYCLEN,SPRINTEC,PREVIFEM Take 1 tablet by mouth daily.   prochlorperazine 10 MG tablet Commonly known as:  COMPAZINE Take 1 tablet by mouth 3 (three) times daily as needed.   propranolol ER 80 MG 24 hr capsule Commonly known as:  INDERAL LA Take 1 capsule (80 mg total) by mouth at bedtime. Needs office visit prior to any additional refills   zolpidem 12.5 MG CR tablet Commonly known as:  AMBIEN CR Take 12.5 mg by mouth at bedtime.       All past medical history, surgical history, allergies, family history, immunizations andmedications were updated in the EMR today and reviewed under the history and medication portions of their EMR.      Mr MrJodene Namead Wo Contrast Result Date: 08/05/2018 IMPRESSION: Stable examination. Previous coiling of an anterior communicating region aneurysm in a patient with both anterior cerebral arteries deriving from the left carotid circulation. 2-3 mm residual flow component has not enlarged or changed in morphology. Electronically Signed  By: Nelson Chimes M.D.   On: 08/05/2018 16:50     ROS: 14 pt review of systems performed and negative (unless mentioned in an HPI)  Objective: BP 108/72 (BP Location: Left Arm, Patient Position: Sitting, Cuff Size: Normal)    Pulse 64   Temp 98.2 F (36.8 C) (Oral)   Resp 16   Ht 5' 5.5" (1.664 m)   Wt 252 lb (114.3 kg)   LMP 09/02/2018   SpO2 100%   BMI 41.30 kg/m  Gen: Afebrile. No acute distress. Nontoxic in appearance, well-developed, well-nourished,  Obese caucasian female.  HENT: AT. [place]. Bilateral TM visualized and normal in appearance, normal external auditory canal. MMM, no oral lesions, adequate dentition. Bilateral nares within normal limits. Throat without erythema, ulcerations or exudates. no Cough on exam, no hoarseness on exam. Eyes:Pupils Equal Round Reactive to light, Extraocular movements intact,  Conjunctiva without redness, discharge or icterus. Neck/lymp/endocrine: Supple,no lymphadenopathy, no thyromegaly CV: RRR no murmur, no edema, +2/4 P posterior tibialis pulses. no carotid bruits. No JVD. Chest: CTAB, no wheeze, rhonchi or crackles. normal Respiratory effort. good Air movement. Abd: Soft. flat. NTND. BS present. no Masses palpated. No hepatosplenomegaly. No rebound tenderness or guarding. Skin: no rashes, purpura or petechiae. Warm and well-perfused. Skin intact. Neuro/Msk:  Normal gait. PERLA. EOMi. Alert. Oriented x3.  Cranial nerves II through XII intact. Muscle strength 5/5 upper/lower extremity. DTRs equal bilaterally. Psych: Normal affect, dress and demeanor. Normal speech. Normal thought content and judgment.   No exam data present  Assessment/plan: [name] is a 48 y.o. female present for CPE . Essential hypertension/Anterior cerebral artery aneurysm/HLD/morbid obesity - stable. Continue Inderal, amlodipine, LASix prn, ASA and fenofibrate  - CBC w/Diff - Comp Met (CMET) - TSH - propranolol ER (INDERAL LA) 80 MG 24 hr capsule; Take 1 capsule (80 mg total) by mouth at bedtime. Needs office visit prior to any additional refills  Dispense: 90 capsule; Refill: 1 - amLODipine (NORVASC) 10 MG tablet; Take 1 tablet (10 mg total) by mouth daily. Needs office visit  prior to anymore refills.  Dispense: 90 tablet; Refill: 1 - atorvastatin (LIPITOR) 40 MG tablet; Take 1 tablet (40 mg total) by mouth daily. Needs office visit prior to anymore refills.  Dispense: 90 tablet; Refill: 3 Diabetes mellitus screening - HgB A1c Encounter for preventive health examination Patient was encouraged to exercise greater than 150 minutes a week. Patient was encouraged to choose a diet filled with fresh fruits and vegetables, and lean meats. AVS provided to patient today for education/recommendation on gender specific health and safety maintenance. Colonoscopy: PGF colon cancer history (80). Screen at 52.  Mammogram: 04/2018, normal, has completed at OB/GYN office. Per pt requesting records.  Cervical cancer screening: 06/20/2017; Dr. Matthew Saras Patient's last menstrual period was 09/02/2018. Immunizations: tdap 09/2016 (due), Influenza 06/2018(encouraged yearly) Infectious disease screening: HIV completed  DEXA: N/A   Return in about 1 year (around 09/24/2019) for CPE. 6 mos on [facility]  Electronically signed by: Howard Pouch, DO [place]

## 2018-09-24 ENCOUNTER — Telehealth: Payer: Self-pay | Admitting: Family Medicine

## 2018-09-24 DIAGNOSIS — E785 Hyperlipidemia, unspecified: Secondary | ICD-10-CM

## 2018-09-24 MED ORDER — FENOFIBRATE 145 MG PO TABS: 145.0000 mg | ORAL_TABLET | Freq: Every day | ORAL | 3 refills | Status: DC

## 2018-09-24 NOTE — Telephone Encounter (Signed)
Please inform patient the following information: Her lipids are about the same. I have increased her tricor dose and sent in refills. So she does not have to wast e meds, she can finish the dose/bottle she currently has by taking 3 of the 48 mg tabs daily she currently has.  Her other labs are normal, with exception of signs of mild dehydration and/or mild infection. Make sure to hydrate and if she soon develops symptoms of an illness that last more than a week- make an appt to see seen. Otherwise, we will recheck those labs on her next appt in 6 months.

## 2018-09-24 NOTE — Telephone Encounter (Signed)
Pt notified of results and voiced understanding

## 2018-11-10 DIAGNOSIS — K589 Irritable bowel syndrome without diarrhea: Secondary | ICD-10-CM | POA: Insufficient documentation

## 2018-12-22 ENCOUNTER — Other Ambulatory Visit: Payer: Self-pay | Admitting: Family Medicine

## 2019-02-03 ENCOUNTER — Telehealth (HOSPITAL_COMMUNITY): Payer: Self-pay

## 2019-02-03 NOTE — Telephone Encounter (Signed)
Called pt to let her know that we still haven't received auth yet. Call back to schedule. AW

## 2019-02-10 ENCOUNTER — Other Ambulatory Visit (HOSPITAL_COMMUNITY): Payer: Self-pay | Admitting: Interventional Radiology

## 2019-02-10 DIAGNOSIS — I729 Aneurysm of unspecified site: Secondary | ICD-10-CM

## 2019-02-15 ENCOUNTER — Other Ambulatory Visit: Payer: Self-pay

## 2019-02-15 ENCOUNTER — Ambulatory Visit (HOSPITAL_COMMUNITY)
Admission: RE | Admit: 2019-02-15 | Discharge: 2019-02-15 | Disposition: A | Discharge status: Home / Self Care | Payer: Commercial Managed Care - PPO | Source: Ambulatory Visit | Attending: Interventional Radiology | Admitting: Interventional Radiology

## 2019-02-15 DIAGNOSIS — I729 Aneurysm of unspecified site: Secondary | ICD-10-CM

## 2019-02-18 ENCOUNTER — Telehealth (HOSPITAL_COMMUNITY): Payer: Self-pay

## 2019-02-18 NOTE — Telephone Encounter (Signed)
Pt agreed to f/u in 6 months with [facility] wo. AW 

## 2019-03-17 ENCOUNTER — Telehealth: Payer: Self-pay

## 2019-03-17 NOTE — Telephone Encounter (Signed)
Called patient and lvm in regards to a medication refill that was faxed to [place] by [facility]. I wanted to see if patient has enough Propranolol 80mg  until her office visit on 03/19/19. Asked patient to call me back.

## 2019-03-19 ENCOUNTER — Telehealth: Payer: Self-pay | Admitting: Family Medicine

## 2019-03-19 ENCOUNTER — Other Ambulatory Visit: Payer: Self-pay

## 2019-03-19 ENCOUNTER — Ambulatory Visit (INDEPENDENT_AMBULATORY_CARE_PROVIDER_SITE_OTHER): Payer: Commercial Managed Care - PPO | Admitting: Family Medicine

## 2019-03-19 ENCOUNTER — Encounter: Payer: Self-pay | Admitting: Family Medicine

## 2019-03-19 VITALS — BP 104/73 | HR 66 | Temp 97.8°F | Resp 17 | Ht 66.0 in | Wt 247.5 lb

## 2019-03-19 DIAGNOSIS — E785 Hyperlipidemia, unspecified: Secondary | ICD-10-CM

## 2019-03-19 DIAGNOSIS — N183 Chronic kidney disease, stage 3 unspecified: Secondary | ICD-10-CM

## 2019-03-19 DIAGNOSIS — I129 Hypertensive chronic kidney disease with stage 1 through stage 4 chronic kidney disease, or unspecified chronic kidney disease: Secondary | ICD-10-CM

## 2019-03-19 DIAGNOSIS — R600 Localized edema: Secondary | ICD-10-CM | POA: Diagnosis not present

## 2019-03-19 DIAGNOSIS — R944 Abnormal results of kidney function studies: Secondary | ICD-10-CM

## 2019-03-19 DIAGNOSIS — I671 Cerebral aneurysm, nonruptured: Secondary | ICD-10-CM

## 2019-03-19 DIAGNOSIS — D72829 Elevated white blood cell count, unspecified: Secondary | ICD-10-CM | POA: Diagnosis not present

## 2019-03-19 DIAGNOSIS — I10 Essential (primary) hypertension: Secondary | ICD-10-CM

## 2019-03-19 LAB — COMPREHENSIVE METABOLIC PANEL
ALT: 24 U/L (ref 0–35)
AST: 24 U/L (ref 0–37)
Albumin: 4.4 g/dL (ref 3.5–5.2)
Alkaline Phosphatase: 40 U/L (ref 39–117)
BUN: 15 mg/dL (ref 6–23)
CO2: 27 mEq/L (ref 19–32)
Calcium: 9.6 mg/dL (ref 8.4–10.5)
Chloride: 106 mEq/L (ref 96–112)
Creatinine, Ser: 1.17 mg/dL (ref 0.40–1.20)
GFR: 49.89 mL/min — ABNORMAL LOW (ref >60.00)
Glucose, Bld: 104 mg/dL — ABNORMAL HIGH (ref 70–99)
Potassium: 4.4 mEq/L (ref 3.5–5.1)
Sodium: 141 mEq/L (ref 135–145)
Total Bilirubin: 0.5 mg/dL (ref 0.2–1.2)
Total Protein: 7 g/dL (ref 6.0–8.3)

## 2019-03-19 LAB — CBC WITH DIFFERENTIAL/PLATELET
Basophils Absolute: 0.1 10*3/uL (ref 0.0–0.1)
Basophils Relative: 0.9 % (ref 0.0–3.0)
Eosinophils Absolute: 0.1 10*3/uL (ref 0.0–0.7)
Eosinophils Relative: 1.5 % (ref 0.0–5.0)
HCT: 38.1 % (ref 36.0–46.0)
Hemoglobin: 12.5 g/dL (ref 12.0–15.0)
Lymphocytes Relative: 17.2 % (ref 12.0–46.0)
Lymphs Abs: 1.3 10*3/uL (ref 0.7–4.0)
MCHC: 32.8 g/dL (ref 30.0–36.0)
MCV: 96 fl (ref 78.0–100.0)
Monocytes Absolute: 0.4 10*3/uL (ref 0.1–1.0)
Monocytes Relative: 5.6 % (ref 3.0–12.0)
Neutro Abs: 5.7 10*3/uL (ref 1.4–7.7)
Neutrophils Relative %: 74.8 % (ref 43.0–77.0)
Platelets: 391 10*3/uL (ref 150.0–400.0)
RBC: 3.97 Mil/uL (ref 3.87–5.11)
RDW: 12.4 % (ref 11.5–15.5)
WBC: 7.6 10*3/uL (ref 4.0–10.5)

## 2019-03-19 LAB — LIPID PANEL
Cholesterol: 248 mg/dL — ABNORMAL HIGH (ref 0–200)
HDL: 61.8 mg/dL (ref >39.00)
LDL Cholesterol: 155 mg/dL — ABNORMAL HIGH (ref 0–99)
NonHDL: 186.42
Total CHOL/HDL Ratio: 4
Triglycerides: 159 mg/dL — ABNORMAL HIGH (ref 0.0–149.0)
VLDL: 31.8 mg/dL (ref 0.0–40.0)

## 2019-03-19 MED ORDER — AMLODIPINE BESYLATE 10 MG PO TABS: 10.0000 mg | ORAL_TABLET | Freq: Every day | ORAL | 1 refills | Status: DC

## 2019-03-19 MED ORDER — FUROSEMIDE 20 MG PO TABS: ORAL_TABLET | ORAL | 0 refills | Status: DC

## 2019-03-19 MED ORDER — PROPRANOLOL HCL ER 80 MG PO CP24: 80.0000 mg | ORAL_CAPSULE | Freq: Every day | ORAL | 1 refills | Status: DC

## 2019-03-19 MED ORDER — ATORVASTATIN CALCIUM 80 MG PO TABS: 80.0000 mg | ORAL_TABLET | Freq: Every day | ORAL | 3 refills | Status: DC

## 2019-03-19 NOTE — Patient Instructions (Signed)
We will call you with lab results . I have refilled your meds.  Stay safe.    High Triglycerides Eating Plan Triglycerides are a type of fat in the blood. High levels of triglycerides can increase your risk of heart disease and stroke. If your triglyceride levels are high, choosing the right foods can help lower your triglycerides and keep your heart healthy. Work with your health care provider or a diet and nutrition specialist (dietitian) to develop an eating plan that is right for you. What are tips for following this plan? General guidelines   Lose weight, if you are overweight. For most people, losing 5-10 lbs (2-5 kg) helps lower triglyceride levels. A weight-loss plan may include. ? 30 minutes of exercise at least 5 days a week. ? Reducing the amount of calories, sugar, and fat you eat.  Eat a wide variety of fresh fruits, vegetables, and whole grains. These foods are high in fiber.  Eat foods that contain healthy fats, such as fatty fish, nuts, seeds, and olive oil.  Avoid foods that are high in added sugar, added salt (sodium), saturated fat, and trans fat.  Avoid low-fiber, refined carbohydrates such as white bread, crackers, noodles, and white rice.  Avoid foods with partially hydrogenated oils (trans fats), such as fried foods or stick margarine.  Limit alcohol intake to no more than 1 drink a day for nonpregnant women and 2 drinks a day for men. One drink equals 12 oz of beer, 5 oz of wine, or 1 oz of hard liquor. Your health care provider may recommend that you drink less depending on your overall health. Reading food labels  Check food labels for the amount of saturated fat. Choose foods with no or very little saturated fat.  Check food labels for the amount of trans fat. Choose foods with no trans fat.  Check food labels for the amount of cholesterol. Choose foods low in cholesterol. Ask your dietitian how much cholesterol you should have each day.  Check food  labels for the amount of sodium. Choose foods with less than 140 milligrams (mg) per serving. Shopping  Buy dairy products labeled as nonfat (skim) or low-fat (1%).  Avoid buying processed or prepackaged foods. These are often high in added sugar, sodium, and fat. Cooking  Choose healthy fats when cooking, such as olive oil or canola oil.  Cook foods using lower fat methods, such as baking, broiling, boiling, or grilling.  Make your own sauces, dressings, and marinades when possible, instead of buying them. Store-bought sauces, dressings, and marinades are often high in sodium and sugar. Meal planning  Eat more home-cooked food and less restaurant, buffet, and fast food.  Eat fatty fish at least 2 times each week. Examples of fatty fish include salmon, trout, mackerel, tuna, and herring.  If you eat whole eggs, do not eat more than 3 egg yolks per week. What foods are recommended? The items listed may not be a complete list. Talk with your dietitian about what dietary choices are best for you. Grains Whole wheat or whole grain breads, crackers, cereals, and pasta. Unsweetened oatmeal. Bulgur. Barley. Quinoa. Brown rice. Whole wheat flour tortillas. Vegetables Fresh or frozen vegetables. Low-sodium canned vegetables. Fruits All fresh, canned (in natural juice), or frozen fruits. Meats and other protein foods Skinless chicken or [place]. Ground chicken or [place]. Lean cuts of pork, trimmed of fat. Fish and seafood, especially salmon, trout, and herring. Egg whites. Dried beans, peas, or lentils. Unsalted nuts or seeds.  Unsalted canned beans. Natural peanut or almond butter. Dairy Low-fat dairy products. Skim or low-fat (1%) milk. Reduced fat (2%) and low-sodium cheese. Low-fat ricotta cheese. Low-fat cottage cheese. Plain, low-fat yogurt. Fats and oils Tub margarine without trans fats. Light or reduced-fat mayonnaise. Light or reduced-fat salad dressings. Avocado. Safflower, olive,  sunflower, soybean, and canola oils. What foods are not recommended? The items listed may not be a complete list. Talk with your dietitian about what dietary choices are best for you. Grains White bread. White (regular) pasta. White rice. Cornbread. Bagels. Pastries. Crackers that contain trans fat. Vegetables Creamed or fried vegetables. Vegetables in a cheese sauce. Fruits Sweetened dried fruit. Canned fruit in syrup. Fruit juice. Meats and other protein foods Fatty cuts of meat. Ribs. Chicken wings. Berniece Salines. Sausage. Bologna. Salami. Chitterlings. Fatback. Hot dogs. Bratwurst. Packaged lunch meats. Dairy Whole or reduced-fat (2%) milk. Half-and-half. Cream cheese. Full-fat or sweetened yogurt. Full-fat cheese. Nondairy creamers. Whipped toppings. Processed cheese or cheese spreads. Cheese curds. Beverages Alcohol. Sweetened drinks, such as soda, lemonade, fruit drinks, or punches. Fats and oils Butter. Stick margarine. Lard. Shortening. Ghee. Bacon fat. Tropical oils, such as coconut, palm kernel, or palm oils. Sweets and desserts Corn syrup. Sugars. Honey. Molasses. Candy. Jam and jelly. Syrup. Sweetened cereals. Cookies. Pies. Cakes. Donuts. Muffins. Ice cream. Condiments Store-bought sauces, dressings, and marinades that are high in sugar, such as ketchup and barbecue sauce. Summary  High levels of triglycerides can increase the risk of heart disease and stroke. Choosing the right foods can help lower your triglycerides.  Eat plenty of fresh fruits, vegetables, and whole grains. Choose low-fat dairy and lean meats. Eat fatty fish at least twice a week.  Avoid processed and prepackaged foods with added sugar, sodium, saturated fat, and trans fat.  If you need suggestions or have questions about what types of food are good for you, talk with your health care provider or a dietitian. This information is not intended to replace advice given to you by your health care provider. Make sure  you discuss any questions you have with your health care provider. Document Released: 06/06/2004 Document Revised: 08/01/2017 Document Reviewed: 10/22/2016 Elsevier Patient Education  2020 [employer].

## 2019-03-19 NOTE — Telephone Encounter (Signed)
Please inform patient the following information: Her kidney function continues to slowly decline- Cr 1.17 and GFR 49. Please encourage her to speak to her psychiatrist concerning her the possibility of her psychiatric medications causing this change. Lithium can cause kidney issues.  Her cholesterol has risen, poss from the medication she is on>>> increased atorvastatin dose to 80 mg. She can finish what she has by taking two 40 mg tabs. New bottle will be 80 mg (take 1). Continue fenofibrate.   Her blood counts are now normal.   Follow up in 3 months to recheck lipids and kidney function  with provider

## 2019-03-19 NOTE — Progress Notes (Signed)
[name] , October 23, 1970, 48 y.o., female MRN: [id number] Patient Care Team    Relationship Specialty Notifications Start End  Ma Hillock, DO PCP - General Family Medicine  05/01/15   Sheralyn Boatman, MD Consulting Physician Psychiatry  11/27/15   Dan Humphreys  Nurse Practitioner  12/26/15   Molli Posey, MD Consulting Physician Obstetrics and Gynecology  09/13/16     Chief Complaint  Patient presents with  . Hypertension    Pt does take BP at home PRN. Pt took BP meds at 0630. Needs refills      Subjective:  Hypertension/Hyperlipidemia/LE edema:   She reports compliance with her Inderal LA, Lipitor, tricor and amlodipine. She has started the lasix 20 mg QD PRN-not taking often, with only mild improvement in LE edema. Patient denies chest pain, shortness of breath, dizziness or lower extremity edema.  Pt is  prescribed statin and taking fish oil 3600 mg daily.  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  BMP: 03/19/2019 GFR 49.89 and declining. CBC: 03/19/2019 within normal limits Lipids: 03/19/2019 cholesterol 248, HDL 61, LDL 155, triglycerides 159. TSH: 09/23/2018 within normal limits Diet: low salt  Exercise: exercising routinely RF: HLD, stable coiled aneurysm, HTN, Fhx  Chronic kidney disease: New declining kidney function over the last 6 months.  Recheck kidney function today.  Consider psychiatric medications as possible because she is on lithium.  Lipid Panel     Component Value Date/Time   CHOL 197 09/23/2018 0843   TRIG 180.0 (H) 09/23/2018 0843   HDL 70.30 09/23/2018 0843   CHOLHDL 3 09/23/2018 0843   VLDL 36.0 09/23/2018 0843   LDLCALC 91 09/23/2018 0843   LDLDIRECT 91.0 01/17/2017 0758    Depression screen PHQ 2/9 03/19/2019 09/23/2018 03/17/2018 09/17/2017 01/22/2017  Decreased Interest 1 1 0 1 0  Down, Depressed, Hopeless 1 1 0 1 0  PHQ - 2 Score 2 2 0 2 0  Altered sleeping 0 1 1 0 -  Tired,  decreased energy 1 1 1 1  -  Change in appetite 2 0 - 1 -  Feeling bad or failure about yourself  2 1 1 1  -  Trouble concentrating 0 0 0 0 -  Moving slowly or fidgety/restless 1 0 0 0 -  Suicidal thoughts 0 0 0 0 -  PHQ-9 Score 8 5 3 5  -  Difficult doing work/chores Somewhat difficult Somewhat difficult Not difficult at all Not difficult at all -  Some encounter information is confidential and restricted. Go to Review Flowsheets activity to see all data.    Allergies  Allergen Reactions  . Ace Inhibitors Swelling and Other (See Comments)    Angioedema    Social History   Tobacco Use  . Smoking status: Never Smoker  . Smokeless tobacco: Never Used  Substance Use Topics  . Alcohol use: Yes    Comment: 2-3 x weekly   Past Medical History:  Diagnosis Date  . Anxiety    takes Ativan daily as needed  . Arthritis   . Depression    psychiatry  . GERD (gastroesophageal reflux disease)   . Headache(784.0)   . Hyperlipidemia    takes Atorvastatin daily  . Hypertension    takes Amlodipine and Propranolol daily  . Insomnia    takes Trazodone at bedtime  . PONV (postoperative nausea and vomiting)   . 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 BRAIN SURGERY     aneurysm coiling  . DILATION AND CURETTAGE OF UTERUS  2009  . LAPAROTOMY    . MYOMECTOMY  05/16/2011   Procedure: MYOMECTOMY;  Surgeon: Margarette Asal;  Location: [facility];  Service: Gynecology;  Laterality: N/A;  abdominal  . RADIOLOGY WITH ANESTHESIA N/A 02/18/2013   Procedure: RADIOLOGY WITH ANESTHESIA;  Surgeon: Rob Hickman, MD;  Location: [facility];  Service: Radiology;  Laterality: N/A;  . RADIOLOGY WITH ANESTHESIA N/A 01/26/2014   Procedure: RADIOLOGY WITH ANESTHESIA;  Surgeon: Rob Hickman, MD;  Location: [facility];  Service: Radiology;  Laterality: N/A;  . RADIOLOGY WITH ANESTHESIA N/A 07/25/2014   Procedure:  RADIOLOGY WITH ANESTHESIA;  Surgeon: Rob Hickman, MD;  Location: [facility];  Service: Radiology;  Laterality: N/A;   Family History  Problem Relation Age of Onset  . Heart disease Mother 40       cad  . Lupus Mother   . COPD Mother   . Arthritis Mother        rheumatoid  . AVM Mother   . Diabetes Mother   . Bipolar disorder Mother   . Drug abuse Mother   . Fibromyalgia Mother   . Diabetes Father   . Cancer Father        thyroid, lung  . Depression Sister   . Diabetes Sister   . Hyperlipidemia Sister   . Hypertension Sister   . Cancer Paternal Grandfather        colon  . Alcohol abuse Paternal Uncle   . Drug abuse Maternal Grandfather   . Alcohol abuse Maternal Grandfather    Allergies as of 03/19/2019      Reactions   Ace Inhibitors Swelling, Other (See Comments)   Angioedema       Medication List       Accurate as of March 19, 2019  8:44 AM. If you have any questions, ask your nurse or doctor.        Aimovig 140 MG/ML Soaj Generic drug: Erenumab-aooe   amLODipine 10 MG tablet Commonly known as: NORVASC Take 1 tablet (10 mg total) by mouth daily. Needs office visit prior to anymore refills.   aspirin EC 325 MG tablet Take 325 mg by mouth daily.   atorvastatin 40 MG tablet Commonly known as: LIPITOR Take 1 tablet (40 mg total) by mouth daily. Needs office visit prior to anymore refills.   baclofen 10 MG tablet Commonly known as: LIORESAL Take 1 tablet by mouth 3 (three) times daily as needed.   clonazePAM 0.5 MG tablet Commonly known as: KLONOPIN Take 0.5-1 mg by mouth 2 (two) times daily. 0.5mg  in the mornnig, 1mg  in the evening   DULoxetine 30 MG capsule Commonly known as: CYMBALTA Take 30 mg by mouth daily. Two tablets daily   fenofibrate 145 MG tablet Commonly known as: TRICOR Take 1 tablet (145 mg total) by mouth daily.   FISH OIL PO Take 1 capsule by mouth daily.   furosemide 20 MG tablet Commonly known as: LASIX TAKE 1/2-1 TAB ONCE  DAILY AS NEEDED   lithium carbonate 300 MG CR tablet Commonly known as: LITHOBID Take 600 mg by mouth 2 (two) times daily.   multivitamin with minerals Tabs tablet Take 1 tablet by mouth daily.   norgestimate-ethinyl estradiol 0.25-35 MG-MCG tablet Commonly known as: ORTHO-CYCLEN Take 1 tablet by mouth daily.   OLANZapine  5 MG tablet Commonly known as: ZYPREXA Take 5 mg by mouth at bedtime.   prochlorperazine 10 MG tablet Commonly known as: COMPAZINE Take 1 tablet by mouth 3 (three) times daily as needed.   propranolol ER 80 MG 24 hr capsule Commonly known as: INDERAL LA Take 1 capsule (80 mg total) by mouth at bedtime. Needs office visit prior to any additional refills   zolpidem 12.5 MG CR tablet Commonly known as: AMBIEN CR Take 12.5 mg by mouth at bedtime.       All past medical history, surgical history, allergies, family history, immunizations andmedications were updated in the EMR today and reviewed under the history and medication portions of their EMR.     ROS: Negative, with the exception of above mentioned in HPI   Objective:  BP 104/73 (BP Location: Left Arm, Patient Position: Sitting, Cuff Size: Normal)   Pulse 66   Temp 97.8 F (36.6 C) (Temporal)   Resp 17   Ht 5\' 6"  (1.676 m)   Wt 247 lb 8 oz (112.3 kg)   LMP 03/15/2019 (Exact Date)   SpO2 97%   BMI 39.95 kg/m  Body mass index is 39.95 kg/m. Gen: Afebrile. No acute distress.  Nontoxic in presentation.  Well-developed, well-nourished, obese, very pleasant Caucasian female. HENT: AT. [place].  MMM. Eyes:Pupils Equal Round Reactive to light, Extraocular movements intact,  Conjunctiva without redness, discharge or icterus. Neck/lymp/endocrine: Supple, no lymphadenopathy, no thyromegaly CV: RRR no murmur, no edema, +2/4 P posterior tibialis pulses Chest: CTAB, no wheeze or crackles Abd: Soft.  Obese. NTND. BS present.  Skin: No rashes, purpura or petechiae.  Neuro:  Normal gait. PERLA. EOMi. Alert.  Oriented x3  Psych: Normal affect, dress and demeanor. Normal speech. Normal thought content and judgment.   No exam data present No results found. No results found for this or any previous visit (from the past 24 hour(s)).  Assessment/Plan: [name] is a 48 y.o. female present for OV for  Essential hypertension/HLD/morbid obesity Anterior cerebral artery aneurysm/extremity edema -Stable blood pressure.  Continue amlodipine and propranolol, refills provided today. -Low-sodium diet. Diet and exercise modifications discussed today.  - Lipid panel - Continue Lipitor 40 mg daily-adjustments will be made if cholesterol panel results are high. -Continue Lasix 20 mg daily as needed.  Use compression stockings.  Weight loss can also be helpful with lower extremity edema.  - make certain fish oil supplement is 3-4g daily. -Continue fenofibrate. - f/u dependent upon labs.  If cholesterol elevated will bring back in 3 months given she was recently started on Zyprexa as well with her psychiatrist.  Chronic kidney disease, stage III: This is a new finding for her.  Started 6 months with declining GFR.  Will obtain BMP today.  If still below normal or worsening will need to consider psychiatric medications as possible cause of her kidney dysfunction. -Close follow-up if still abnormal in 3 months   Reviewed expectations re: course of current medical issues.  Discussed self-management of symptoms.  Outlined signs and symptoms indicating need for more acute intervention.  Patient verbalized understanding and all questions were answered.  Patient received an After-Visit Summary.   Note is dictated utilizing voice recognition software. Although note has been proof read prior to signing, occasional typographical errors still can be missed. If any questions arise, please do not hesitate to call for verification.   > 25 minutes spent with patient, >50% of time spent face to face     electronically  signed by:  Howard Pouch, DO  [place]

## 2019-03-22 ENCOUNTER — Encounter: Payer: Self-pay | Admitting: Family Medicine

## 2019-03-22 DIAGNOSIS — N183 Chronic kidney disease, stage 3 unspecified: Secondary | ICD-10-CM | POA: Insufficient documentation

## 2019-03-22 NOTE — Telephone Encounter (Signed)
Patient states that she will call the Psychiatrist to discuss medications.   She is aware of the atorvastatin dose change.  3 month follow-up has been scheduled for 06/23/2019 @ 8:30am.

## 2019-03-24 ENCOUNTER — Ambulatory Visit: Payer: Self-pay | Admitting: Family Medicine

## 2019-04-19 ENCOUNTER — Telehealth: Payer: Self-pay

## 2019-04-19 NOTE — Telephone Encounter (Signed)
Pt called and LM on nurses VM asking If labs could be sent to one her specialist. Pt was called back and VM was left letting pt know I would call her back tomorrow to get the specialist name, most recent labs printed off and on my desk. Will call patient back tomorrow.

## 2019-04-20 NOTE — Telephone Encounter (Signed)
Pt was called and needs labs faxed to the following:  Beryle Lathe NP at [facility], Mental health-  [phone]   Labs printed and faxed.

## 2019-06-23 ENCOUNTER — Ambulatory Visit (INDEPENDENT_AMBULATORY_CARE_PROVIDER_SITE_OTHER): Payer: Commercial Managed Care - PPO | Admitting: Family Medicine

## 2019-06-23 ENCOUNTER — Encounter: Payer: Self-pay | Admitting: Family Medicine

## 2019-06-23 ENCOUNTER — Other Ambulatory Visit: Payer: Self-pay

## 2019-06-23 VITALS — BP 125/89 | HR 82 | Temp 97.7°F | Resp 18 | Ht 66.0 in | Wt 252.1 lb

## 2019-06-23 DIAGNOSIS — I10 Essential (primary) hypertension: Secondary | ICD-10-CM | POA: Diagnosis not present

## 2019-06-23 DIAGNOSIS — E785 Hyperlipidemia, unspecified: Secondary | ICD-10-CM | POA: Diagnosis not present

## 2019-06-23 DIAGNOSIS — R21 Rash and other nonspecific skin eruption: Secondary | ICD-10-CM | POA: Diagnosis not present

## 2019-06-23 DIAGNOSIS — N1831 Chronic kidney disease, stage 3a: Secondary | ICD-10-CM

## 2019-06-23 LAB — LIPID PANEL
Cholesterol: 212 mg/dL — ABNORMAL HIGH (ref 0–200)
HDL: 64.9 mg/dL (ref >39.00)
LDL Cholesterol: 108 mg/dL — ABNORMAL HIGH (ref 0–99)
NonHDL: 146.99
Total CHOL/HDL Ratio: 3
Triglycerides: 196 mg/dL — ABNORMAL HIGH (ref 0.0–149.0)
VLDL: 39.2 mg/dL (ref 0.0–40.0)

## 2019-06-23 LAB — COMPREHENSIVE METABOLIC PANEL
ALT: 17 U/L (ref 0–35)
AST: 16 U/L (ref 0–37)
Albumin: 4.3 g/dL (ref 3.5–5.2)
Alkaline Phosphatase: 50 U/L (ref 39–117)
BUN: 15 mg/dL (ref 6–23)
CO2: 27 mEq/L (ref 19–32)
Calcium: 9.8 mg/dL (ref 8.4–10.5)
Chloride: 103 mEq/L (ref 96–112)
Creatinine, Ser: 1.02 mg/dL (ref 0.40–1.20)
GFR: 58.38 mL/min — ABNORMAL LOW (ref >60.00)
Glucose, Bld: 109 mg/dL — ABNORMAL HIGH (ref 70–99)
Potassium: 4.1 mEq/L (ref 3.5–5.1)
Sodium: 139 mEq/L (ref 135–145)
Total Bilirubin: 0.5 mg/dL (ref 0.2–1.2)
Total Protein: 6.9 g/dL (ref 6.0–8.3)

## 2019-06-23 MED ORDER — AMLODIPINE BESYLATE 10 MG PO TABS: 10.0000 mg | ORAL_TABLET | Freq: Every day | ORAL | 0 refills | Status: DC

## 2019-06-23 MED ORDER — TRIAMCINOLONE ACETONIDE 0.1 % EX CREA: 1.0000 "application " | TOPICAL_CREAM | Freq: Two times a day (BID) | CUTANEOUS | 0 refills | Status: DC

## 2019-06-23 MED ORDER — PROPRANOLOL HCL ER 80 MG PO CP24: 80.0000 mg | ORAL_CAPSULE | Freq: Every day | ORAL | 0 refills | Status: DC

## 2019-06-23 NOTE — Patient Instructions (Signed)
Start steroid cream called in for your ear- twice a day.  If you do not see improvement in 2-4 weeks- or it is worsening with cream>> follow up.   I will call you with lab results once available.

## 2019-06-23 NOTE — Progress Notes (Signed)
[name] , 06-16-1971, 48 y.o., female MRN: [id number] Patient Care Team    Relationship Specialty Notifications Start End  Ma Hillock, DO PCP - General Family Medicine  05/01/15   Sheralyn Boatman, MD Consulting Physician Psychiatry  11/27/15   Dan Humphreys  Nurse Practitioner  12/26/15   Molli Posey, MD Consulting Physician Obstetrics and Gynecology  09/13/16     Chief Complaint  Patient presents with  . Hyperlipidemia    Pt is here for recheck of lipids and kidney function      Subjective:  Hypertension/Hyperlipidemia/LE edema:   She reports compliance with her Inderal LA, Lipitor, tricor and amlodipine. She has started the lasix 20 mg QD PRN-not taking often, with only mild improvement in LE edema. Patient denies chest pain, shortness of breath, dizziness or lower extremity edema.  She is tolerating the higher dose statin- which was increased 3 months ago.   Pt is  prescribed statin and taking fish oil 3600 mg daily.  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  BMP: 03/19/2019 GFR 49.89 and declining. CBC: 03/19/2019 within normal limits Lipids: 03/19/2019 cholesterol 248, HDL 61, LDL 155, triglycerides 159. TSH: 09/23/2018 within normal limits Diet: low salt  Exercise: exercising routinely RF: HLD, stable coiled aneurysm, HTN, Fhx  Chronic kidney disease: New declining kidney function over the last 6 months.  Recheck kidney function today- now off lithium 5 weeks.   Rash: Pt reports 6 mos dry itchy right ear. She has been using a&D ointment, but it is not helpful. She denies injury prior to onset. She doe snot have a known h/o eczema.   Lipid Panel     Component Value Date/Time   CHOL 248 (H) 03/19/2019 0905   TRIG 159.0 (H) 03/19/2019 0905   HDL 61.80 03/19/2019 0905   CHOLHDL 4 03/19/2019 0905   VLDL 31.8 03/19/2019 0905   LDLCALC 155 (H) 03/19/2019 0905   LDLDIRECT 91.0 01/17/2017 0758     Depression screen PHQ 2/9 03/19/2019 09/23/2018 03/17/2018 09/17/2017 01/22/2017  Decreased Interest 1 1 0 1 0  Down, Depressed, Hopeless 1 1 0 1 0  PHQ - 2 Score 2 2 0 2 0  Altered sleeping 0 1 1 0 -  Tired, decreased energy 1 1 1 1  -  Change in appetite 2 0 - 1 -  Feeling bad or failure about yourself  2 1 1 1  -  Trouble concentrating 0 0 0 0 -  Moving slowly or fidgety/restless 1 0 0 0 -  Suicidal thoughts 0 0 0 0 -  PHQ-9 Score 8 5 3 5  -  Difficult doing work/chores Somewhat difficult Somewhat difficult Not difficult at all Not difficult at all -  Some encounter information is confidential and restricted. Go to Review Flowsheets activity to see all data.    Allergies  Allergen Reactions  . Ace Inhibitors Swelling and Other (See Comments)    Angioedema    Social History   Tobacco Use  . Smoking status: Never Smoker  . Smokeless tobacco: Never Used  Substance Use Topics  . Alcohol use: Yes    Comment: 2-3 x weekly   Past Medical History:  Diagnosis Date  . Anxiety    takes Ativan daily as needed  . Arthritis   . Depression    psychiatry  . GERD (gastroesophageal reflux disease)   . Headache(784.0)   . Hyperlipidemia  takes Atorvastatin daily  . Hypertension    takes Amlodipine and Propranolol daily  . Insomnia    takes Trazodone at bedtime  . PONV (postoperative nausea and vomiting)   . 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 BRAIN SURGERY     aneurysm coiling  . DILATION AND CURETTAGE OF UTERUS  2009  . LAPAROTOMY    . MYOMECTOMY  05/16/2011   Procedure: MYOMECTOMY;  Surgeon: Margarette Asal;  Location: [facility];  Service: Gynecology;  Laterality: N/A;  abdominal  . RADIOLOGY WITH ANESTHESIA N/A 02/18/2013   Procedure: RADIOLOGY WITH ANESTHESIA;  Surgeon: Rob Hickman, MD;  Location: [facility];  Service: Radiology;  Laterality: N/A;  . RADIOLOGY WITH  ANESTHESIA N/A 01/26/2014   Procedure: RADIOLOGY WITH ANESTHESIA;  Surgeon: Rob Hickman, MD;  Location: [facility];  Service: Radiology;  Laterality: N/A;  . RADIOLOGY WITH ANESTHESIA N/A 07/25/2014   Procedure: RADIOLOGY WITH ANESTHESIA;  Surgeon: Rob Hickman, MD;  Location: [facility];  Service: Radiology;  Laterality: N/A;   Family History  Problem Relation Age of Onset  . Heart disease Mother 74       cad  . Lupus Mother   . COPD Mother   . Arthritis Mother        rheumatoid  . AVM Mother   . Diabetes Mother   . Bipolar disorder Mother   . Drug abuse Mother   . Fibromyalgia Mother   . Diabetes Father   . Cancer Father        thyroid, lung  . Depression Sister   . Diabetes Sister   . Hyperlipidemia Sister   . Hypertension Sister   . Cancer Paternal Grandfather        colon  . Alcohol abuse Paternal Uncle   . Drug abuse Maternal Grandfather   . Alcohol abuse Maternal Grandfather    Allergies as of 06/23/2019      Reactions   Ace Inhibitors Swelling, Other (See Comments)   Angioedema       Medication List       Accurate as of June 23, 2019  8:32 AM. If you have any questions, ask your nurse or doctor.        STOP taking these medications   Aimovig 140 MG/ML Soaj Generic drug: Erenumab-aooe Stopped by: Howard Pouch, DO   lithium carbonate 300 MG CR tablet Commonly known as: LITHOBID Stopped by: Howard Pouch, DO   zolpidem 12.5 MG CR tablet Commonly known as: AMBIEN CR Stopped by: Howard Pouch, DO     TAKE these medications   Ajovy 225 MG/1.5ML Soaj Generic drug: Fremanezumab-vfrm Inject into the skin.   amLODipine 10 MG tablet Commonly known as: NORVASC Take 1 tablet (10 mg total) by mouth daily. Needs office visit prior to anymore refills.   aspirin EC 325 MG tablet Take 325 mg by mouth daily.   atorvastatin 80 MG tablet Commonly known as: LIPITOR Take 1 tablet (80 mg total) by mouth daily. Needs office visit prior to anymore refills.    baclofen 10 MG tablet Commonly known as: LIORESAL Take 1 tablet by mouth 3 (three) times daily as needed.   clonazePAM 0.5 MG tablet Commonly known as: KLONOPIN Take 0.5-1 mg by mouth 2 (two) times daily. 0.5mg  in the mornnig, 1mg  in the evening   DULoxetine 30 MG capsule Commonly known as: CYMBALTA Take 30  mg by mouth daily. Two tablets daily   fenofibrate 145 MG tablet Commonly known as: TRICOR Take 1 tablet (145 mg total) by mouth daily.   FISH OIL PO Take 1 capsule by mouth daily.   furosemide 20 MG tablet Commonly known as: LASIX TAKE 1/2-1 TAB ONCE DAILY AS NEEDED   multivitamin with minerals Tabs tablet Take 1 tablet by mouth daily.   norgestimate-ethinyl estradiol 0.25-35 MG-MCG tablet Commonly known as: ORTHO-CYCLEN Take 1 tablet by mouth daily.   OLANZapine 5 MG tablet Commonly known as: ZYPREXA Take 5 mg by mouth at bedtime.   prochlorperazine 10 MG tablet Commonly known as: COMPAZINE Take 1 tablet by mouth 3 (three) times daily as needed.   propranolol ER 80 MG 24 hr capsule Commonly known as: INDERAL LA Take 1 capsule (80 mg total) by mouth at bedtime. Needs office visit prior to any additional refills       All past medical history, surgical history, allergies, family history, immunizations andmedications were updated in the EMR today and reviewed under the history and medication portions of their EMR.     ROS: Negative, with the exception of above mentioned in HPI   Objective:  BP 125/89 (BP Location: Left Arm, Patient Position: Sitting, Cuff Size: Normal)   Pulse 82   Temp 97.7 F (36.5 C) (Temporal)   Resp 18   Ht 5\' 6"  (1.676 m)   Wt 252 lb 2 oz (114.4 kg)   LMP 06/09/2019 (Approximate)   SpO2 92%   BMI 40.69 kg/m  Body mass index is 40.69 kg/m. Gen: Afebrile. No acute distress. Nontoxic. caucasian female.  HENT: AT. [place]. Bilateral TM visualized and normal in appearance. External right ear with dry flaky skin, with mild erythema and  swelling.  Eyes:Pupils Equal Round Reactive to light, Extraocular movements intact,  Conjunctiva without redness, discharge or icterus. Neck/lymp/endocrine: Supple,no lymphadenopathy CV: RRR no murmur, no edema, +2/4 P posterior tibialis pulses Chest: CTAB, no wheeze or crackles Abd: Soft. NTND. BS present.  MSK: no CVA tenderness bilateral  Neuro:  Normal gait. PERLA. EOMi. Alert. Oriented x3  No exam data present No results found. No results found for this or any previous visit (from the past 24 hour(s)).  Assessment/Plan: LIANDRA UNDERBERG is a 48 y.o. female present for OV for  Essential hypertension/HLD/morbid obesity Anterior cerebral artery aneurysm/extremity edema -Stable blood pressure.  Continue amlodipine and propranolol, refills provided today -Low-sodium diet. Diet and exercise modifications discussed today.  - Lipid panel - continue  Lipitor 80 mg daily- tolerating higher dose (psychiatric meds poss. Cause of increase) -Continue Lasix 20 mg daily as needed.  Use compression stockings.  Weight loss can also be helpful with lower extremity edema.  - make certain fish oil supplement is 3-4g daily. - continue fenofibrate. - cmp and lipids recheck today. - f/u 6 mos.   Chronic kidney disease, stage III: - She has now been removed from lithium use. Hopefully, kidney function has returned normal.  - recheck today- if still declining/abnormal will need to pursue further work up spep/upep and [place].   Rash:  - right ear pinna and helix with mild redness, swelling and dryness. No obvious lesions. No injury reported. Onset > 6 mos.  - start with kenalog cream BID. If not seeing improvement in 2-4 weeks or worsening>> follow up and consider abx ointment or antifungal ointment.  Derm/ENT   Reviewed expectations re: course of current medical issues.  Discussed self-management of symptoms.  Outlined signs  and symptoms indicating need for more acute intervention.  Patient  verbalized understanding and all questions were answered.  Patient received an After-Visit Summary.   Note is dictated utilizing voice recognition software. Although note has been proof read prior to signing, occasional typographical errors still can be missed. If any questions arise, please do not hesitate to call for verification.      electronically signed by:  Howard Pouch, DO  [place]

## 2019-08-05 ENCOUNTER — Telehealth: Payer: Self-pay | Admitting: Family Medicine

## 2019-08-05 NOTE — Telephone Encounter (Signed)
Pt was called and told we could email her the release and she could sign and send back or drop off. She agreed and would like document emailed to her. Email on file is correct. Please send her a release [place].

## 2019-08-05 NOTE — Telephone Encounter (Signed)
Patient request to send lab results to [facility]. Fax number is [phone]  I told patient we needed a signed release, she said that Dr. Raoul Pitch has sent these over before for her.  If there is something I can assist with let me know.  Patient can be reached at [phone]  Thank you

## 2019-08-30 ENCOUNTER — Other Ambulatory Visit (HOSPITAL_COMMUNITY): Payer: Self-pay | Admitting: Interventional Radiology

## 2019-08-30 DIAGNOSIS — I671 Cerebral aneurysm, nonruptured: Secondary | ICD-10-CM

## 2019-09-13 ENCOUNTER — Ambulatory Visit (HOSPITAL_COMMUNITY)
Admission: RE | Admit: 2019-09-13 | Discharge: 2019-09-13 | Disposition: A | Discharge status: Home / Self Care | Payer: Commercial Managed Care - PPO | Source: Ambulatory Visit | Attending: Interventional Radiology | Admitting: Interventional Radiology

## 2019-09-13 ENCOUNTER — Other Ambulatory Visit: Payer: Self-pay

## 2019-09-13 DIAGNOSIS — I671 Cerebral aneurysm, nonruptured: Secondary | ICD-10-CM | POA: Diagnosis not present

## 2019-09-14 ENCOUNTER — Telehealth (HOSPITAL_COMMUNITY): Payer: Self-pay

## 2019-09-14 NOTE — Telephone Encounter (Signed)
Pt agreed to f/u in 6 months with [facility] head wo. AW

## 2019-09-22 ENCOUNTER — Other Ambulatory Visit: Payer: Self-pay

## 2019-09-22 DIAGNOSIS — E785 Hyperlipidemia, unspecified: Secondary | ICD-10-CM

## 2019-09-22 MED ORDER — FENOFIBRATE 145 MG PO TABS: 145.0000 mg | ORAL_TABLET | Freq: Every day | ORAL | 0 refills | Status: DC

## 2019-09-22 NOTE — Progress Notes (Signed)
Faxed refill request from [facility] for pts fenofibrate. Pt has yearly appt 12/2019. Up to date on [facility]. Sent RX for 90 days

## 2019-10-04 ENCOUNTER — Telehealth: Payer: Self-pay

## 2019-10-04 DIAGNOSIS — I10 Essential (primary) hypertension: Secondary | ICD-10-CM

## 2019-10-04 MED ORDER — AMLODIPINE BESYLATE 10 MG PO TABS: 10.0000 mg | ORAL_TABLET | Freq: Every day | ORAL | 0 refills | Status: DC

## 2019-10-04 NOTE — Telephone Encounter (Signed)
90 day supply sent to pharmacy. Pt has 6 month [place] visit scheduled in April 2021.

## 2019-10-04 NOTE — Telephone Encounter (Signed)
Message left on pts VM letting he know RX was sent to pharmacy and of pts appt in 12/2019

## 2019-10-04 NOTE — Telephone Encounter (Signed)
Patient requesting refill of amlodipine to be sent to [place].

## 2019-12-21 ENCOUNTER — Other Ambulatory Visit: Payer: Self-pay

## 2019-12-21 ENCOUNTER — Ambulatory Visit (INDEPENDENT_AMBULATORY_CARE_PROVIDER_SITE_OTHER): Payer: Commercial Managed Care - PPO | Admitting: Family Medicine

## 2019-12-21 ENCOUNTER — Encounter: Payer: Self-pay | Admitting: Family Medicine

## 2019-12-21 DIAGNOSIS — I10 Essential (primary) hypertension: Secondary | ICD-10-CM

## 2019-12-21 MED ORDER — AMLODIPINE BESYLATE 10 MG PO TABS: 10.0000 mg | ORAL_TABLET | Freq: Every day | ORAL | 1 refills | Status: DC

## 2019-12-21 MED ORDER — PROPRANOLOL HCL ER 120 MG PO CP24: 120.0000 mg | ORAL_CAPSULE | Freq: Every day | ORAL | 1 refills | Status: DC

## 2019-12-21 NOTE — Patient Instructions (Signed)
Increased your propanol for more BP coverage.  Monitor BP at home with goal < 130/80- if over this routinely after medications in the afternoon>> follow up sooner. Otherwise follow up beginning of October for your Physical and chronic conditions.

## 2019-12-21 NOTE — Progress Notes (Signed)
Pre visit review using our clinic review tool, if applicable. No additional management support is needed unless otherwise documented below in the visit note. 

## 2019-12-21 NOTE — Progress Notes (Signed)
[name] , 18-Feb-1971, 49 y.o., female MRN: [id number] Patient Care Team    Relationship Specialty Notifications Start End  Ma Hillock, DO PCP - General Family Medicine  05/01/15   Dan Humphreys  Nurse Practitioner  12/26/15   Molli Posey, MD Consulting Physician Obstetrics and Gynecology  09/13/16   Nevada Crane, MD Consulting Physician Psychiatry  12/21/19     Chief Complaint  Patient presents with  . Follow-up    no concerns     Subjective: [name] is a 49 y.o. female Hypertension/Hyperlipidemia/LE edema:   She reports compliance with her Inderal LA 80, Lipitor, tricor and amlodipine 10. She has started the lasix 20 mg QD PRN-not taking often, with only mild improvement in LE edema. Patient denies chest pain, shortness of breath, dizziness or lower extremity edema.  She is tolerating the higher dose statin.   Pt is  prescribed statin and taking fish oil 3600 mg daily.  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  BMP: 03/19/2019 GFR 49.89 and declining. CBC: 03/19/2019 within normal limits Lipids: 03/19/2019 cholesterol 248, HDL 61, LDL 155, triglycerides 159. TSH: 09/23/2018 within normal limits Diet: low salt  Exercise: exercising routinely RF: HLD, stable coiled aneurysm, HTN, Fhx  Chronic kidney disease: improved now off lithium.  Lipid Panel     Component Value Date/Time   CHOL 212 (H) 06/23/2019 0855   TRIG 196.0 (H) 06/23/2019 0855   HDL 64.90 06/23/2019 0855   CHOLHDL 3 06/23/2019 0855   VLDL 39.2 06/23/2019 0855   LDLCALC 108 (H) 06/23/2019 0855   LDLDIRECT 91.0 01/17/2017 0758    Depression screen PHQ 2/9 03/19/2019 09/23/2018 03/17/2018 09/17/2017 01/22/2017  Decreased Interest 1 1 0 1 0  Down, Depressed, Hopeless 1 1 0 1 0  PHQ - 2 Score 2 2 0 2 0  Altered sleeping 0 1 1 0 -  Tired, decreased energy 1 1 1 1  -  Change in appetite 2 0 - 1 -  Feeling bad or failure about  yourself  2 1 1 1  -  Trouble concentrating 0 0 0 0 -  Moving slowly or fidgety/restless 1 0 0 0 -  Suicidal thoughts 0 0 0 0 -  PHQ-9 Score 8 5 3 5  -  Difficult doing work/chores Somewhat difficult Somewhat difficult Not difficult at all Not difficult at all -  Some encounter information is confidential and restricted. Go to Review Flowsheets activity to see all data.    Allergies  Allergen Reactions  . Ace Inhibitors Swelling and Other (See Comments)    Angioedema    Social History   Tobacco Use  . Smoking status: Never Smoker  . Smokeless tobacco: Never Used  Substance Use Topics  . Alcohol use: Yes    Comment: 2-3 x weekly   Past Medical History:  Diagnosis Date  . Anxiety    takes Ativan daily as needed  . Arthritis   . Depression    psychiatry  . GERD (gastroesophageal reflux disease)   . Headache(784.0)   . Hyperlipidemia    takes Atorvastatin daily  . Hypertension    takes Amlodipine and Propranolol daily  . Insomnia    takes Trazodone at bedtime  . PONV (postoperative nausea and vomiting)   . 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  BRAIN SURGERY     aneurysm coiling  . DILATION AND CURETTAGE OF UTERUS  2009  . LAPAROTOMY    . MYOMECTOMY  05/16/2011   Procedure: MYOMECTOMY;  Surgeon: Margarette Asal;  Location: [facility];  Service: Gynecology;  Laterality: N/A;  abdominal  . RADIOLOGY WITH ANESTHESIA N/A 02/18/2013   Procedure: RADIOLOGY WITH ANESTHESIA;  Surgeon: Rob Hickman, MD;  Location: [facility];  Service: Radiology;  Laterality: N/A;  . RADIOLOGY WITH ANESTHESIA N/A 01/26/2014   Procedure: RADIOLOGY WITH ANESTHESIA;  Surgeon: Rob Hickman, MD;  Location: [facility];  Service: Radiology;  Laterality: N/A;  . RADIOLOGY WITH ANESTHESIA N/A 07/25/2014   Procedure: RADIOLOGY WITH ANESTHESIA;  Surgeon: Rob Hickman, MD;  Location: [facility];  Service:  Radiology;  Laterality: N/A;   Family History  Problem Relation Age of Onset  . Heart disease Mother 21       cad  . Lupus Mother   . COPD Mother   . Arthritis Mother        rheumatoid  . AVM Mother   . Diabetes Mother   . Bipolar disorder Mother   . Drug abuse Mother   . Fibromyalgia Mother   . Diabetes Father   . Cancer Father        thyroid, lung  . Depression Sister   . Diabetes Sister   . Hyperlipidemia Sister   . Hypertension Sister   . Cancer Paternal Grandfather        colon  . Alcohol abuse Paternal Uncle   . Drug abuse Maternal Grandfather   . Alcohol abuse Maternal Grandfather    Allergies as of 47/20/2021      Reactions   Ace Inhibitors Swelling, Other (See Comments)   Angioedema       Medication List       Accurate as of December 21, 2019  9:49 AM. If you have any questions, ask your nurse or doctor.        Ajovy 225 MG/1.5ML Soaj Generic drug: Fremanezumab-vfrm Inject into the skin.   amLODipine 10 MG tablet Commonly known as: NORVASC Take 1 tablet (10 mg total) by mouth daily.   aspirin EC 325 MG tablet Take 325 mg by mouth daily.   atorvastatin 80 MG tablet Commonly known as: LIPITOR Take 1 tablet (80 mg total) by mouth daily. Needs office visit prior to anymore refills.   baclofen 10 MG tablet Commonly known as: LIORESAL Take 1 tablet by mouth 3 (three) times daily as needed.   clonazePAM 0.5 MG tablet Commonly known as: KLONOPIN Take 0.5-1 mg by mouth 2 (two) times daily. 0.5mg  in the mornnig, 1mg  in the evening   DULoxetine 60 MG capsule Commonly known as: CYMBALTA Take 60 mg by mouth daily.   Equetro 300 MG Cp12 Generic drug: Carbamazepine Take 900 mg by mouth daily.   fenofibrate 145 MG tablet Commonly known as: TRICOR Take 1 tablet (145 mg total) by mouth daily.   FISH OIL PO Take 1 capsule by mouth daily.   furosemide 20 MG tablet Commonly known as: LASIX TAKE 1/2-1 TAB ONCE DAILY AS NEEDED   multivitamin with  minerals Tabs tablet Take 1 tablet by mouth daily.   norgestimate-ethinyl estradiol 0.25-35 MG-MCG tablet Commonly known as: ORTHO-CYCLEN Take 1 tablet by mouth daily.   OLANZapine 15 MG tablet Commonly known as: ZYPREXA Take 15 mg by mouth at bedtime.   prochlorperazine 10 MG tablet Commonly known as: COMPAZINE Take 1 tablet by  mouth 3 (three) times daily as needed.   propranolol ER 120 MG 24 hr capsule Commonly known as: Inderal LA Take 1 capsule (120 mg total) by mouth daily. What changed:   medication strength  how much to take  when to take this  additional instructions Changed by: Howard Pouch, DO   QUEtiapine 100 MG tablet Commonly known as: SEROQUEL Take 100 mg by mouth at bedtime.   triamcinolone cream 0.1 % Commonly known as: KENALOG Apply 1 application topically 2 (two) times daily.   zolpidem 12.5 MG CR tablet Commonly known as: AMBIEN CR Take 12.5 mg by mouth at bedtime.       All past medical history, surgical history, allergies, family history, immunizations andmedications were updated in the EMR today and reviewed under the history and medication portions of their EMR.     ROS: Negative, with the exception of above mentioned in HPI   Objective:  BP (!) 144/99 (BP Location: Left Arm, Patient Position: Sitting, Cuff Size: Normal)   Pulse 71   Temp (!) 94 F (34.4 C) (Temporal)   Resp 18   Ht 5\' 6"  (1.676 m)   Wt 231 lb 2 oz (104.8 kg)   SpO2 95%   BMI 37.30 kg/m  Body mass index is 37.3 kg/m. Gen: Afebrile. No acute distress. Pleasant female.  HENT: AT. [place].  Eyes:Pupils Equal Round Reactive to light, Extraocular movements intact,  Conjunctiva without redness, discharge or icterus. Neck/lymp/endocrine: Supple,no lymphadenopathy, no thyromegaly CV: RRR no murmur, no edema, +2/4 P posterior tibialis pulses Chest: CTAB, no wheeze or crackles Skin: no rashes, purpura or petechiae.  Neuro:  Normal gait. PERLA. EOMi. Alert. Oriented x3    Psych: Normal affect, dress and demeanor. Normal speech. Normal thought content and judgment.    No exam data present No results found. No results found for this or any previous visit (from the past 24 hour(s)).  Assessment/Plan: ISSAMAR HUCKABAY is a 49 y.o. female present for OV for  Essential hypertension/HLD/morbid obesity Anterior cerebral artery aneurysm/extremity edema -BP above goal today and on rpt.  - Continue amlodipine 10  - increase propranolo 80 > 120- pt to monitor - goal at least <130/80 with history -Low-sodium diet. Diet and exercise modifications discussed today.  - continue  Lipitor 80 mg daily -Continue Lasix 20 mg daily as needed. Rarely needs.  -Use compression stockings.  Weight loss can also be helpful with lower extremity edema ans she has done great w/ 20 lbs weight loss.  - make certain fish oil supplement is 3-4g daily. - continue fenofibrate. - labs UTD - f/u 5.5 mos.   Chronic kidney disease, stage III: - normal now off  lithium    Reviewed expectations re: course of current medical issues.  Discussed self-management of symptoms.  Outlined signs and symptoms indicating need for more acute intervention.  Patient verbalized understanding and all questions were answered.  Patient received an After-Visit Summary.   Note is dictated utilizing voice recognition software. Although note has been proof read prior to signing, occasional typographical errors still can be missed. If any questions arise, please do not hesitate to call for verification.   No orders of the defined types were placed in this encounter.  Meds ordered this encounter  Medications  . propranolol ER (INDERAL LA) 120 MG 24 hr capsule    Sig: Take 1 capsule (120 mg total) by mouth daily.    Dispense:  90 capsule    Refill:  1  .  amLODipine (NORVASC) 10 MG tablet    Sig: Take 1 tablet (10 mg total) by mouth daily.    Dispense:  90 tablet    Refill:  1   Referral Orders   No referral(s) requested today      electronically signed by:  Howard Pouch, DO  [place]

## 2019-12-23 ENCOUNTER — Other Ambulatory Visit: Payer: Self-pay

## 2019-12-23 DIAGNOSIS — E785 Hyperlipidemia, unspecified: Secondary | ICD-10-CM

## 2019-12-23 MED ORDER — FENOFIBRATE 145 MG PO TABS: 145.0000 mg | ORAL_TABLET | Freq: Every day | ORAL | 0 refills | Status: DC

## 2020-01-10 LAB — CBC AND DIFFERENTIAL: Hemoglobin: 12.6 (ref 12.0–16.0)

## 2020-01-10 LAB — HM PAP SMEAR

## 2020-01-12 ENCOUNTER — Encounter: Payer: Self-pay | Admitting: Family Medicine

## 2020-01-26 DIAGNOSIS — Z975 Presence of (intrauterine) contraceptive device: Secondary | ICD-10-CM | POA: Insufficient documentation

## 2020-03-13 ENCOUNTER — Other Ambulatory Visit (HOSPITAL_COMMUNITY): Payer: Self-pay | Admitting: Interventional Radiology

## 2020-03-13 DIAGNOSIS — I671 Cerebral aneurysm, nonruptured: Secondary | ICD-10-CM

## 2020-03-26 ENCOUNTER — Other Ambulatory Visit: Payer: Self-pay | Admitting: Family Medicine

## 2020-03-26 DIAGNOSIS — E785 Hyperlipidemia, unspecified: Secondary | ICD-10-CM

## 2020-03-28 ENCOUNTER — Other Ambulatory Visit: Payer: Self-pay

## 2020-03-28 DIAGNOSIS — E785 Hyperlipidemia, unspecified: Secondary | ICD-10-CM

## 2020-03-28 MED ORDER — ATORVASTATIN CALCIUM 80 MG PO TABS: 80.0000 mg | ORAL_TABLET | Freq: Every day | ORAL | 0 refills | Status: DC

## 2020-04-03 ENCOUNTER — Ambulatory Visit (HOSPITAL_COMMUNITY)
Admission: RE | Admit: 2020-04-03 | Discharge: 2020-04-03 | Disposition: A | Discharge status: Home / Self Care | Payer: Commercial Managed Care - PPO | Source: Ambulatory Visit | Attending: Interventional Radiology | Admitting: Interventional Radiology

## 2020-04-03 ENCOUNTER — Other Ambulatory Visit: Payer: Self-pay

## 2020-04-03 DIAGNOSIS — I671 Cerebral aneurysm, nonruptured: Secondary | ICD-10-CM | POA: Insufficient documentation

## 2020-04-06 ENCOUNTER — Telehealth (HOSPITAL_COMMUNITY): Payer: Self-pay

## 2020-04-06 NOTE — Telephone Encounter (Signed)
Pt agreed to f/u in 6 months with mra head wo. AW  

## 2020-06-09 ENCOUNTER — Other Ambulatory Visit: Payer: Self-pay | Admitting: Family Medicine

## 2020-06-18 IMAGING — MR MR MRA HEAD W/O CM
3 series · 19 of 48 positions shown · non-contrast
Comparison: 01/19/2018 and multiple previous MRIs as distant as
07/31/2016. Angiography 01/26/2016.

CLINICAL DATA: Followup aneurysm.

EXAM:
MRA HEAD WITHOUT CONTRAST
TECHNIQUE: Angiographic images of the Circle of Willis were obtained using MRA
technique without intravenous contrast.

[Series 3: (id) mt fs · axial · 1.4mm · 0.43mm/px · z∈[-86,+15]mm · 17 of 152 slices shown]
[im 1/152]
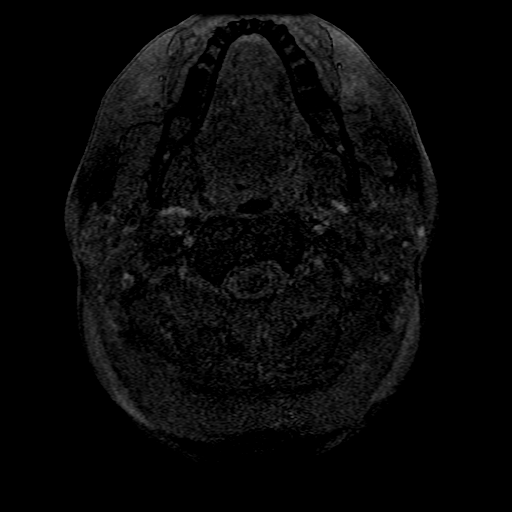
[im 4/152]
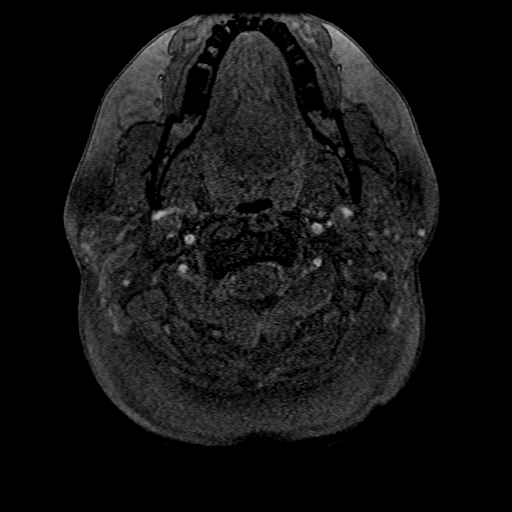
[im 7/152]
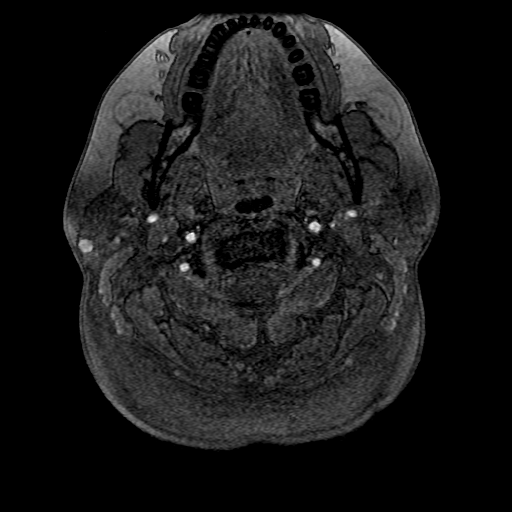
[im 11/152]
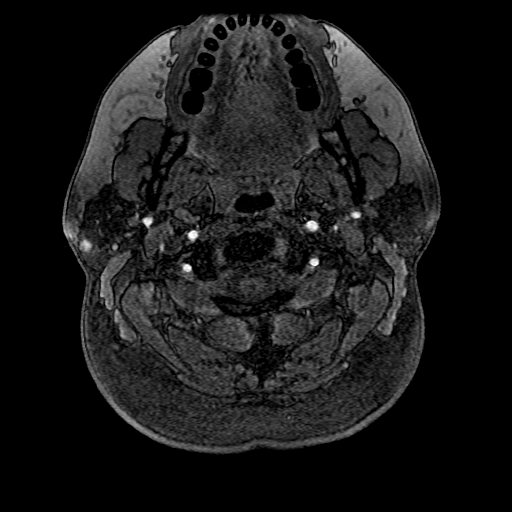
[im 14/152]
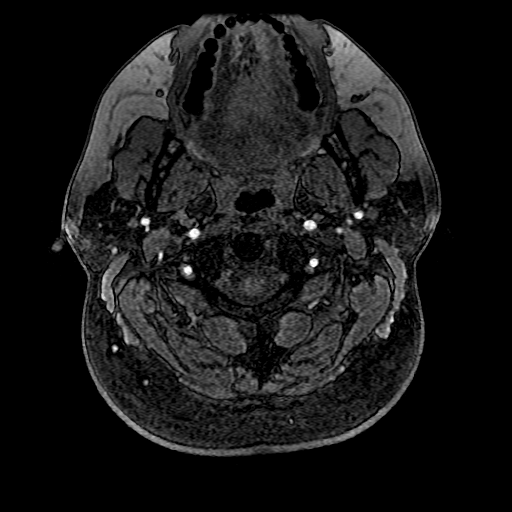
[im 17/152]
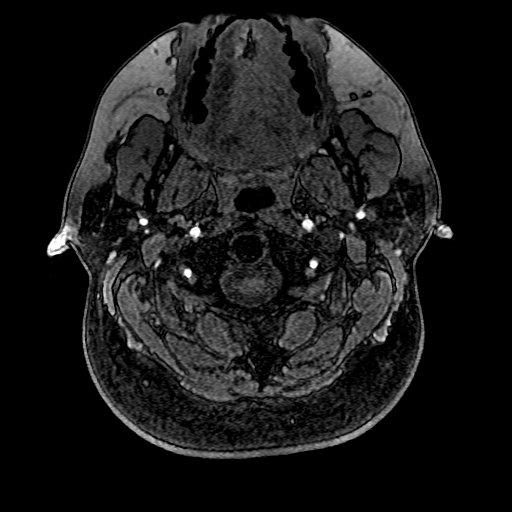
[im 21/152]
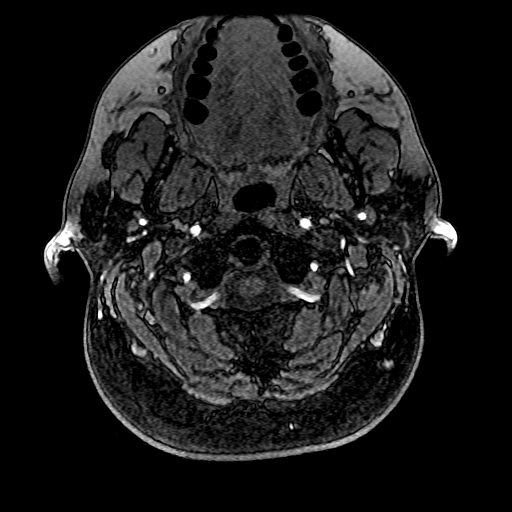
[im 24/152]
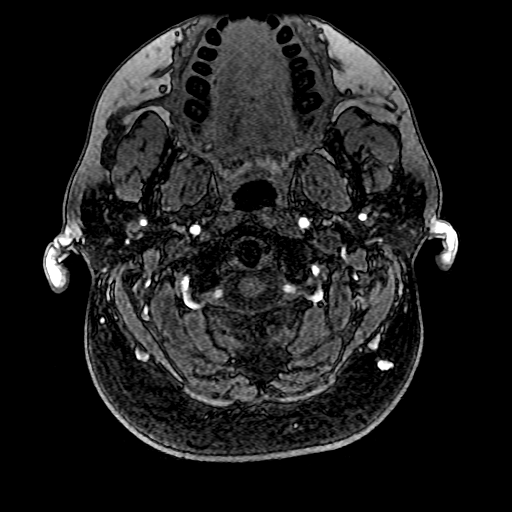
[im 27/152]
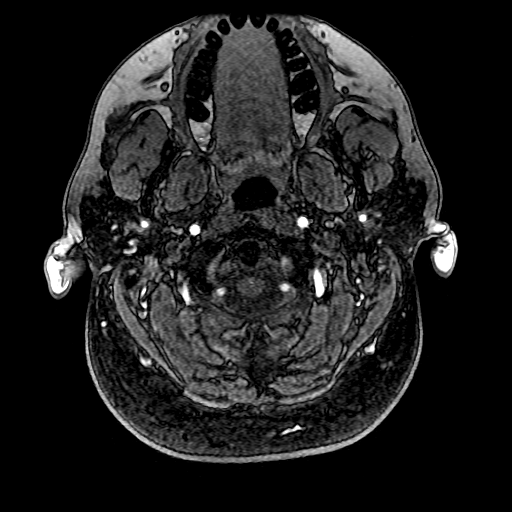
[im 47/152]
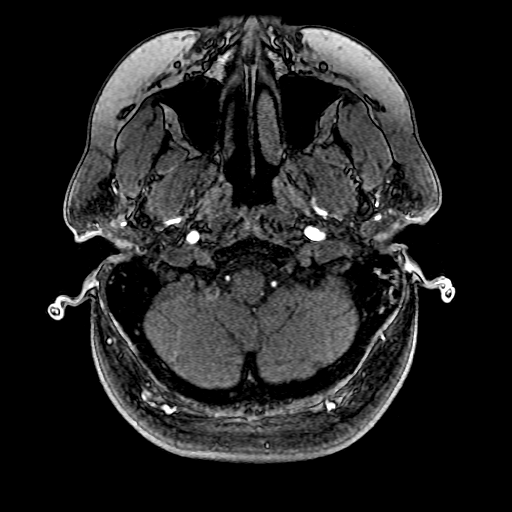
[im 68/152]
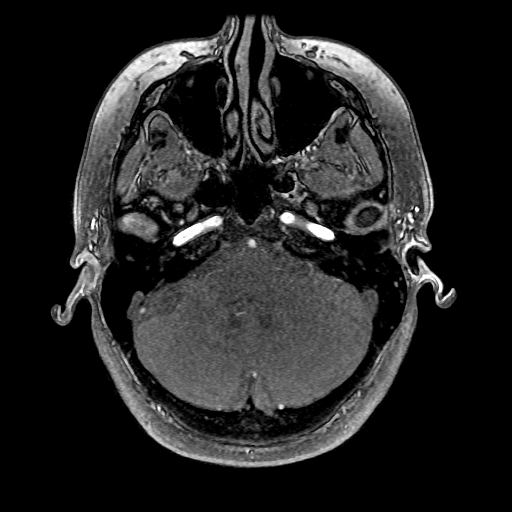
[im 78/152]
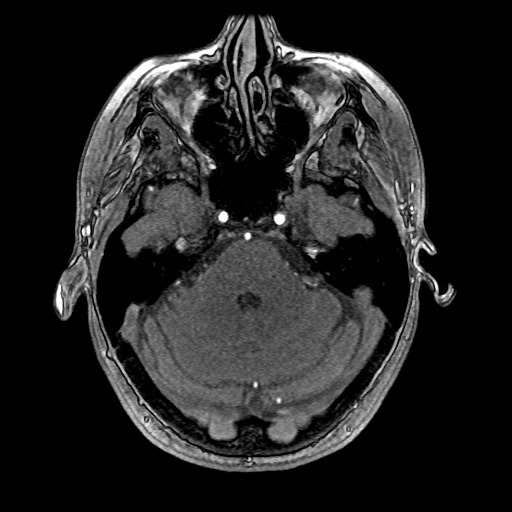
[im 84/152]
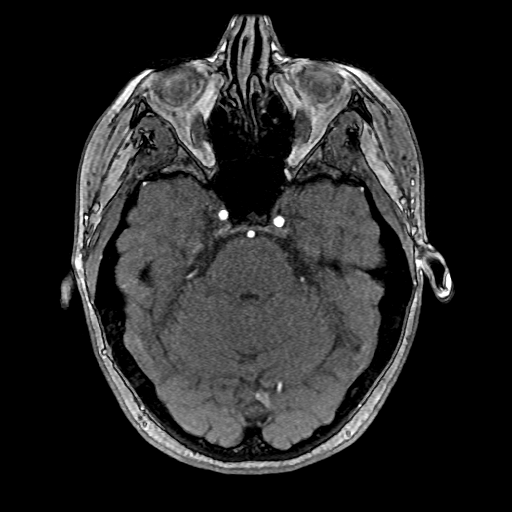
[im 105/152]
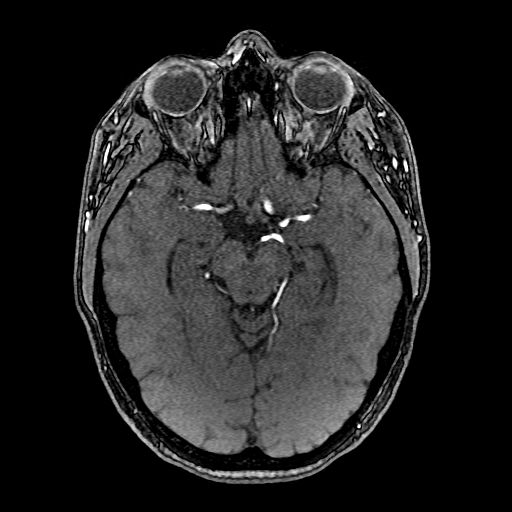
[im 125/152]
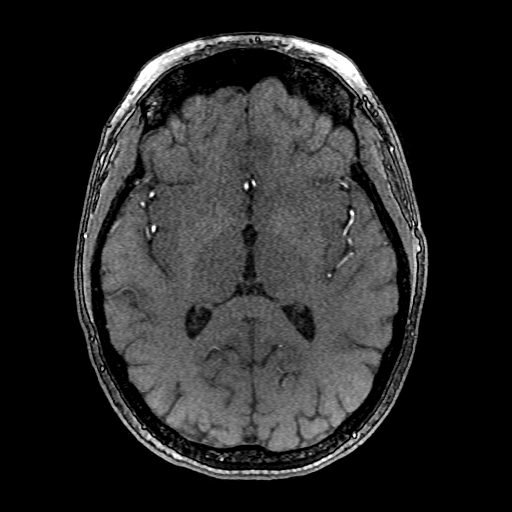
[im 128/152]
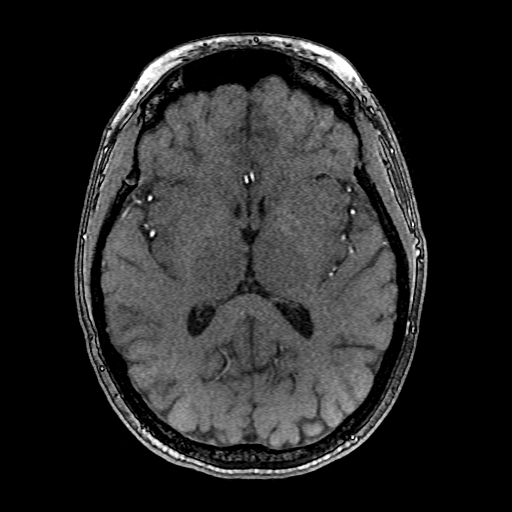
[im 145/152]
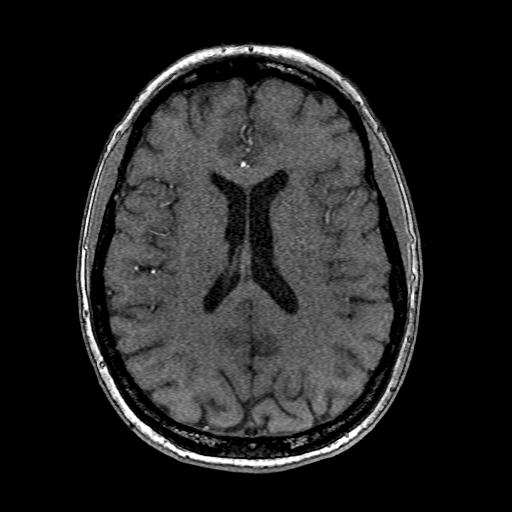

[Series 302: processed images · axial · 1.4mm · 0.35mm/px · 1 of 1 slices shown (1 of 2)]
[im 1/1]
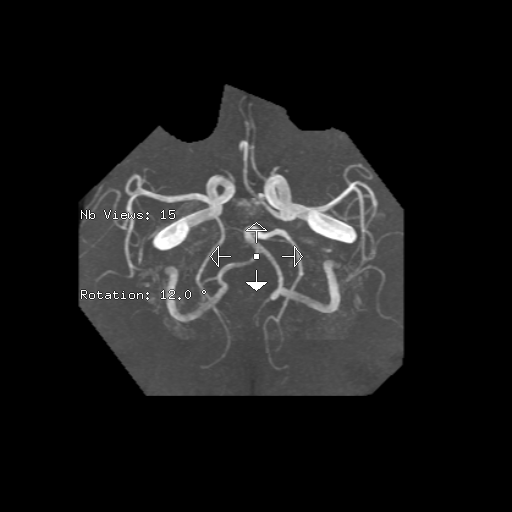

[Series 303: processed images · axial · 1.4mm · 0.35mm/px · 1 of 1 slices shown (2 of 2)]
[im 1/1]
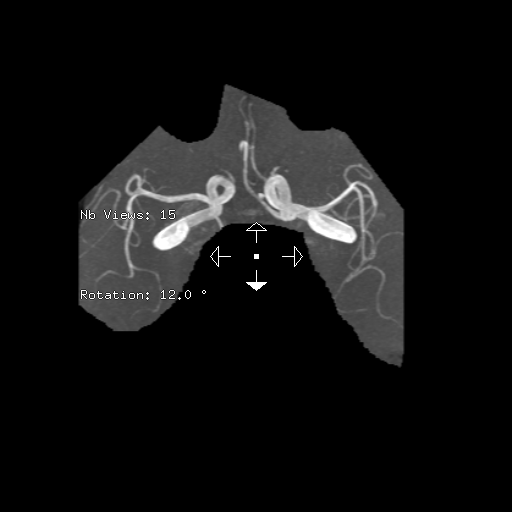

[19 of 48 positions shown; findings below may reference images not displayed]

FINDINGS: Both internal carotid arteries are patent through the skull base. On
the right, the A1 segment is aplastic. There is fetal origin of the
posterior cerebral artery. No aneurysm seen on the right. On the
left, the internal carotid arteries widely patent through the siphon
region. Middle cerebral artery and its branches appear normal. There
is a large A1 segment supplying both anterior cerebral arteries.
Previously coiled anterior communicating region aneurysm with
residual 2-3 mm component showing flow, similar to the previous
studies. No evidence of enlargement or change.

Both vertebral arteries are widely patent to the basilar. No basilar
stenosis. Posterior circulation branch vessels appear normal.
IMPRESSION: Stable examination. Previous coiling of an anterior communicating
region aneurysm in a patient with both anterior cerebral arteries
deriving from the left carotid circulation. 2-3 mm residual flow
component has not enlarged or changed in morphology.

## 2020-06-19 ENCOUNTER — Other Ambulatory Visit: Payer: Self-pay | Admitting: Family Medicine

## 2020-06-19 DIAGNOSIS — E785 Hyperlipidemia, unspecified: Secondary | ICD-10-CM

## 2020-06-21 ENCOUNTER — Other Ambulatory Visit: Payer: Self-pay

## 2020-06-21 ENCOUNTER — Encounter: Payer: Self-pay | Admitting: Family Medicine

## 2020-06-21 ENCOUNTER — Ambulatory Visit (INDEPENDENT_AMBULATORY_CARE_PROVIDER_SITE_OTHER): Payer: Commercial Managed Care - PPO | Admitting: Family Medicine

## 2020-06-21 VITALS — BP 120/85 | HR 87 | Temp 98.6°F | Ht 66.25 in | Wt 257.0 lb

## 2020-06-21 DIAGNOSIS — E785 Hyperlipidemia, unspecified: Secondary | ICD-10-CM

## 2020-06-21 DIAGNOSIS — Z131 Encounter for screening for diabetes mellitus: Secondary | ICD-10-CM

## 2020-06-21 DIAGNOSIS — Z1159 Encounter for screening for other viral diseases: Secondary | ICD-10-CM | POA: Diagnosis not present

## 2020-06-21 DIAGNOSIS — I10 Essential (primary) hypertension: Secondary | ICD-10-CM | POA: Diagnosis not present

## 2020-06-21 DIAGNOSIS — Z0000 Encounter for general adult medical examination without abnormal findings: Secondary | ICD-10-CM | POA: Diagnosis not present

## 2020-06-21 DIAGNOSIS — Z1211 Encounter for screening for malignant neoplasm of colon: Secondary | ICD-10-CM

## 2020-06-21 LAB — CBC WITH DIFFERENTIAL/PLATELET
Basophils Absolute: 0 10*3/uL (ref 0.0–0.1)
Basophils Relative: 0.7 % (ref 0.0–3.0)
Eosinophils Absolute: 0.1 10*3/uL (ref 0.0–0.7)
Eosinophils Relative: 1.5 % (ref 0.0–5.0)
HCT: 38.2 % (ref 36.0–46.0)
Hemoglobin: 12.7 g/dL (ref 12.0–15.0)
Lymphocytes Relative: 22 % (ref 12.0–46.0)
Lymphs Abs: 1.4 10*3/uL (ref 0.7–4.0)
MCHC: 33.3 g/dL (ref 30.0–36.0)
MCV: 90.5 fl (ref 78.0–100.0)
Monocytes Absolute: 0.5 10*3/uL (ref 0.1–1.0)
Monocytes Relative: 7.1 % (ref 3.0–12.0)
Neutro Abs: 4.4 10*3/uL (ref 1.4–7.7)
Neutrophils Relative %: 68.7 % (ref 43.0–77.0)
Platelets: 355 10*3/uL (ref 150.0–400.0)
RBC: 4.21 Mil/uL (ref 3.87–5.11)
RDW: 13.5 % (ref 11.5–15.5)
WBC: 6.4 10*3/uL (ref 4.0–10.5)

## 2020-06-21 LAB — COMPREHENSIVE METABOLIC PANEL
ALT: 24 U/L (ref 0–35)
AST: 18 U/L (ref 0–37)
Albumin: 4.5 g/dL (ref 3.5–5.2)
Alkaline Phosphatase: 44 U/L (ref 39–117)
BUN: 15 mg/dL (ref 6–23)
CO2: 29 mEq/L (ref 19–32)
Calcium: 9.9 mg/dL (ref 8.4–10.5)
Chloride: 101 mEq/L (ref 96–112)
Creatinine, Ser: 1.11 mg/dL (ref 0.40–1.20)
GFR: 59.15 mL/min — ABNORMAL LOW (ref >60.00)
Glucose, Bld: 101 mg/dL — ABNORMAL HIGH (ref 70–99)
Potassium: 4.3 mEq/L (ref 3.5–5.1)
Sodium: 138 mEq/L (ref 135–145)
Total Bilirubin: 0.5 mg/dL (ref 0.2–1.2)
Total Protein: 7 g/dL (ref 6.0–8.3)

## 2020-06-21 LAB — LIPID PANEL
Cholesterol: 216 mg/dL — ABNORMAL HIGH (ref 0–200)
HDL: 80.4 mg/dL (ref >39.00)
LDL Cholesterol: 118 mg/dL — ABNORMAL HIGH (ref 0–99)
NonHDL: 135.13
Total CHOL/HDL Ratio: 3
Triglycerides: 86 mg/dL (ref 0.0–149.0)
VLDL: 17.2 mg/dL (ref 0.0–40.0)

## 2020-06-21 LAB — TSH: TSH: 2.79 u[IU]/mL (ref 0.35–4.50)

## 2020-06-21 LAB — HEMOGLOBIN A1C: Hgb A1c MFr Bld: 6 % (ref 4.6–6.5)

## 2020-06-21 MED ORDER — ATORVASTATIN CALCIUM 80 MG PO TABS: 80.0000 mg | ORAL_TABLET | Freq: Every day | ORAL | 1 refills | Status: DC

## 2020-06-21 MED ORDER — PROPRANOLOL HCL ER 120 MG PO CP24: 120.0000 mg | ORAL_CAPSULE | Freq: Every day | ORAL | 1 refills | Status: DC

## 2020-06-21 MED ORDER — FENOFIBRATE 145 MG PO TABS: 145.0000 mg | ORAL_TABLET | Freq: Every day | ORAL | 1 refills | Status: DC

## 2020-06-21 MED ORDER — AMLODIPINE BESYLATE 10 MG PO TABS: 10.0000 mg | ORAL_TABLET | Freq: Every day | ORAL | 1 refills | Status: DC

## 2020-06-21 NOTE — Progress Notes (Signed)
This visit occurred during the SARS-CoV-2 public health emergency.  Safety protocols were in place, including screening questions prior to the visit, additional usage of staff PPE, and extensive cleaning of exam room while observing appropriate contact time as indicated for disinfecting solutions.    Patient ID: [name], female  DOB: 1971-05-16, 49 y.o.   MRN: [id number] Patient Care Team    Relationship Specialty Notifications Start End  Ma Hillock, DO PCP - General Family Medicine  05/01/15   Dan Humphreys  Nurse Practitioner  12/26/15   Molli Posey, MD Consulting Physician Obstetrics and Gynecology  09/13/16   Nevada Crane, MD Consulting Physician Psychiatry  12/21/19     Chief Complaint  Patient presents with  . Annual Exam    pt is fasting     Subjective:  NIL [name] is a 49 y.o.  Female  present for CPE. All past medical history, surgical history, allergies, family history, immunizations, medications and social history were updated in the electronic medical record today. All recent labs, ED visits and hospitalizations within the last year were reviewed.  Health maintenance:  Colonoscopy: PGF colon cancer history (80). Screen at 47>referred to female GI  Mammogram: 04/2018, normal, has completed at OB/GYN office.Perpt requesting records.  Cervical cancer screening: 01/2020 Immunizations: tdap1/2018UTD, Influenza UTD 05/2020 (encouraged yearly), covid series completed. Discussed booster had covid also in Aug. 2021 Infectious disease screening: HIV completed and hep c screen collected today DEXA: N/A Assistive device: none Oxygen IZT:IWPY Patient has a Dental home. Hospitalizations/ED visits: reviewed  Hypertension/Hyperlipidemia/LE edema:   She reports compliance with her Inderal LA 80, Lipitor, tricor and amlodipine 10. She has started the lasix 20 mg QD PRN-not taking often, with only mild improvement in LE edema. Patient denies chest  pain, shortness of breath, dizziness or lower extremity edema.  Pt is  prescribed statin and taking fish oil 3600 mg daily.  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  Diet: low salt  Exercise: exercising routinely RF: HLD, stable coiled aneurysm, HTN, Fhx   Depression screen [facility] 2/9 06/21/2020 03/19/2019 09/23/2018 03/17/2018 09/17/2017  Decreased Interest 0 1 1 0 1  Down, Depressed, Hopeless 0 1 1 0 1  PHQ - 2 Score 0 2 2 0 2  Altered sleeping - 0 1 1 0  Tired, decreased energy - 1 1 1 1   Change in appetite - 2 0 - 1  Feeling bad or failure about yourself  - 2 1 1 1   Trouble concentrating - 0 0 0 0  Moving slowly or fidgety/restless - 1 0 0 0  Suicidal thoughts - 0 0 0 0  PHQ-9 Score - 8 5 3 5   Difficult doing work/chores - Somewhat difficult Somewhat difficult Not difficult at all Not difficult at all  Some encounter information is confidential and restricted. Go to Review Flowsheets activity to see all data.  Some recent data might be hidden   Immunization History  Administered Date(s) Administered  . Influenza Split 05/18/2011  . Influenza,inj,Quad PF,6+ Mos 07/09/2013, 05/04/2014, 05/11/2015, 05/29/2018  . Influenza-Unspecified 06/20/2017, 05/14/2019, 05/12/2020  . PFIZER SARS-COV-2 Vaccination 11/29/2019, 12/24/2019  . Td 09/02/2005  . Tdap 09/13/2016    Past Medical History:  Diagnosis Date  . Anxiety    takes Ativan daily as needed  . Arthritis   . Depression    psychiatry  . GERD (gastroesophageal reflux disease)   . Headache(784.0)   .  Hyperlipidemia    takes Atorvastatin daily  . Hypertension    takes Amlodipine and Propranolol daily  . Insomnia    takes Trazodone at bedtime  . PONV (postoperative nausea and vomiting)   . 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 Ace Inhibitors Swelling and Other (See Comments)    Angioedema     Past Surgical History:  Procedure Laterality Date  . BRAIN SURGERY     aneurysm coiling  . DILATION AND CURETTAGE OF UTERUS  2009  . LAPAROTOMY    . MYOMECTOMY  05/16/2011   Procedure: MYOMECTOMY;  Surgeon: Margarette Asal;  Location: [facility];  Service: Gynecology;  Laterality: N/A;  abdominal  . RADIOLOGY WITH ANESTHESIA N/A 02/18/2013   Procedure: RADIOLOGY WITH ANESTHESIA;  Surgeon: Rob Hickman, MD;  Location: [place];  Service: Radiology;  Laterality: N/A;  . RADIOLOGY WITH ANESTHESIA N/A 01/26/2014   Procedure: RADIOLOGY WITH ANESTHESIA;  Surgeon: Rob Hickman, MD;  Location: [place];  Service: Radiology;  Laterality: N/A;  . RADIOLOGY WITH ANESTHESIA N/A 07/25/2014   Procedure: RADIOLOGY WITH ANESTHESIA;  Surgeon: Rob Hickman, MD;  Location: [place];  Service: Radiology;  Laterality: N/A;   Family History  Problem Relation Age of Onset  . Heart disease Mother 56       cad  . Lupus Mother   . COPD Mother   . Arthritis Mother        rheumatoid  . AVM Mother   . Diabetes Mother   . Bipolar disorder Mother   . Drug abuse Mother   . Fibromyalgia Mother   . Diabetes Father   . Cancer Father        thyroid, lung  . Depression Sister   . Diabetes Sister   . Hyperlipidemia Sister   . Hypertension Sister   . Cancer Paternal Grandfather        colon  . Alcohol abuse Paternal Uncle   . Drug abuse Maternal Grandfather   . Alcohol abuse Maternal Grandfather    Social History   Social History Narrative   Lives with husband   Caffeine- 2-12 oz cans daily    Allergies as of 06/21/2020      Reactions   Ace Inhibitors Swelling, Other (See Comments)   Angioedema       Medication List       Accurate as of June 21, 2020  8:45 AM. If you have any questions, ask your nurse or doctor.        STOP taking these medications   DULoxetine 60 MG capsule Commonly known as: CYMBALTA Stopped by: Howard Pouch, DO   Equetro 300 MG Cp12 Generic drug:  Carbamazepine Stopped by: Howard Pouch, DO   furosemide 20 MG tablet Commonly known as: LASIX Stopped by: Howard Pouch, DO   hydrOXYzine 50 MG capsule Commonly known as: VISTARIL Stopped by: Howard Pouch, DO   norgestimate-ethinyl estradiol 0.25-35 MG-MCG tablet Commonly known as: ORTHO-CYCLEN Stopped by: Howard Pouch, DO   OLANZapine 15 MG tablet Commonly known as: ZYPREXA Stopped by: Howard Pouch, DO   triamcinolone cream 0.1 % Commonly known as: KENALOG Stopped by: Howard Pouch, DO   Ubrogepant 100 MG Tabs Stopped by: Howard Pouch, DO     TAKE these medications   Ajovy 225 MG/1.5ML Soaj Generic drug: Fremanezumab-vfrm Inject into the skin.   amLODipine 10 MG tablet Commonly known as: NORVASC Take  1 tablet (10 mg total) by mouth daily.   aspirin EC 325 MG tablet Take 325 mg by mouth daily.   atorvastatin 80 MG tablet Commonly known as: LIPITOR Take 1 tablet (80 mg total) by mouth daily.   baclofen 10 MG tablet Commonly known as: LIORESAL Take 1 tablet by mouth 3 (three) times daily as needed.   clonazePAM 0.5 MG tablet Commonly known as: KLONOPIN Take 0.5-1 mg by mouth 2 (two) times daily. 0.5mg  in the mornnig, 1mg  in the evening   fenofibrate 145 MG tablet Commonly known as: TRICOR Take 1 tablet (145 mg total) by mouth daily.   FISH OIL PO Take 1 capsule by mouth daily.   Mirena (52 MG) 20 MCG/24HR IUD Generic drug: levonorgestrel Mirena 20 mcg/24 hours (6 yrs) 52 mg intrauterine device  Take 1 device by intrauterine route.   multivitamin with minerals Tabs tablet Take 1 tablet by mouth daily.   prazosin 2 MG capsule Commonly known as: MINIPRESS Take 4 mg by mouth at bedtime.   prochlorperazine 10 MG tablet Commonly known as: COMPAZINE Take 1 tablet by mouth 3 (three) times daily as needed.   propranolol ER 120 MG 24 hr capsule Commonly known as: INDERAL LA Take 1 capsule (120 mg total) by mouth daily. What changed: how much to  take Changed by: Howard Pouch, DO   SEROquel XR 150 MG 24 hr tablet Generic drug: QUEtiapine Fumarate Take 150 mg by mouth at bedtime. What changed: Another medication with the same name was removed. Continue taking this medication, and follow the directions you see here. Changed by: Howard Pouch, DO   Viibryd 40 MG Tabs Generic drug: Vilazodone HCl Take 40 mg by mouth daily.   zolpidem 12.5 MG CR tablet Commonly known as: AMBIEN CR Take 12.5 mg by mouth at bedtime.       All past medical history, surgical history, allergies, family history, immunizations andmedications were updated in the EMR today and reviewed under the history and medication portions of their EMR.     No results found for this or any previous visit (from the past 2160 hour(s)).  MR ANGIO HEAD WO CONTRAST  Result Date: 04/03/2020 CLINICAL DATA:  Aneurysm follow-up EXAM: MRA HEAD WITHOUT CONTRAST TECHNIQUE: Angiographic images of the Circle of Willis were obtained using MRA technique without intravenous contrast. COMPARISON:  09/13/2019 FINDINGS: POSTERIOR CIRCULATION: --Vertebral arteries: Normal V4 segments. --Inferior cerebellar arteries: Normal. --Basilar artery: Normal. --Superior cerebellar arteries: Normal. --Posterior cerebral arteries: Normal. ANTERIOR CIRCULATION: --Intracranial internal carotid arteries: Normal. --Anterior cerebral arteries (ACA): Stenting coiling at the site of treated anterior communicating artery aneurysm. There is a small amount of residual flow related enhancement at the right posterior aspect of the coil mass. This is unchanged, measuring 3 mm. --Middle cerebral arteries (MCA): Normal. IMPRESSION: Unchanged appearance of treated anterior communicating artery aneurysm with a small amount of residual flow related enhancement at the right posterior aspect of the coil mass. Electronically Signed   By: Ulyses Jarred M.D.   On: 04/03/2020 23:57     ROS: 14 pt review of systems performed and  negative (unless mentioned in an HPI)  Objective: BP 120/85   Pulse 87   Temp 98.6 F (37 C) (Oral)   Ht 5' 6.25" (1.683 m)   Wt 257 lb (116.6 kg)   SpO2 97%   BMI 41.17 kg/m  Gen: Afebrile. No acute distress. Nontoxic in appearance, well-developed, well-nourished,  Pleasant female.  HENT: AT. [place]. Bilateral TM visualized and  normal in appearance, normal external auditory canal. MMM, no oral lesions, adequate dentition. Bilateral nares within normal limits. Throat without erythema, ulcerations or exudates. no Cough on exam, no hoarseness on exam. Eyes:Pupils Equal Round Reactive to light, Extraocular movements intact,  Conjunctiva without redness, discharge or icterus. Neck/lymp/endocrine: Supple,no lymphadenopathy, no thyromegaly CV: RRR no murmur, no edema, +2/4 P posterior tibialis pulses.  Chest: CTAB, no wheeze, rhonchi or crackles. normal Respiratory effort. godd Air movement. Abd: Soft. NTND. BS present. no Masses palpated. No hepatosplenomegaly. No rebound tenderness or guarding. Skin: no rashes, purpura or petechiae. Warm and well-perfused. Skin intact. Neuro/Msk:  Normal gait. PERLA. EOMi. Alert. Oriented x3.  Cranial nerves II through XII intact. Muscle strength 5/5 upper/lower extremity. DTRs equal bilaterally. Psych: Normal affect, dress and demeanor. Normal speech. Normal thought content and judgment.  No exam data present  Assessment/plan: BRANDIE LOPES is a 49 y.o. female present for  CPE/CMC Essential hypertension/HLD/morbid obesity Anterior cerebral artery aneurysm/extremity edema - stable - continue amlodipine 10  - continue propranolo 80 > 120- pt to monitor - goal at least <130/80 with history -Low-sodium diet. Diet and exercise modifications discussed today.  - continue  Lipitor 80 mg daily -Continue Lasix 20 mg daily as needed. Rarely needs.  -Use compression stockings.  Weight loss can also be helpful with lower extremity edema ans she has done great  w/ 20 lbs weight loss.  - make certain fish oil supplement is 3-4g daily. - continue fenofibrate - CBC with Differential/Platelet - Comprehensive metabolic panel - Lipid panel - TSH F/u 5.5 mos  Need for hepatitis C screening test - Hepatitis C Antibody Diabetes mellitus screening - Hemoglobin A1c Colon cancer screening - Ambulatory referral to Gastroenterology  Encounter for preventive health examination Patient was encouraged to exercise greater than 150 minutes a week. Patient was encouraged to choose a diet filled with fresh fruits and vegetables, and lean meats. AVS provided to patient today for education/recommendation on gender specific health and safety maintenance. Colonoscopy: PGF colon cancer history (80). Screen at 47>referred to female GI  Mammogram: 04/2018, normal, has completed at OB/GYN office.Perpt requesting records.  Cervical cancer screening: 01/2020 Immunizations: tdap1/2018UTD, Influenza UTD 05/2020 (encouraged yearly), covid series completed. Discussed booster had covid also in Aug. 2021 Infectious disease screening: HIV completed and hep c screen collected today DEXA: N/A  Return in about 24 weeks (around 12/06/2020) for [facility]MC (30 min). and 1 yr CPE   Orders Placed This Encounter  Procedures  . CBC with Differential/Platelet  . Comprehensive metabolic panel  . Hemoglobin A1c  . Lipid panel  . TSH  . Hepatitis C Antibody  . Ambulatory referral to Gastroenterology   Meds ordered this encounter  Medications  . propranolol ER (INDERAL LA) 120 MG 24 hr capsule    Sig: Take 1 capsule (120 mg total) by mouth daily.    Dispense:  90 capsule    Refill:  1  . fenofibrate (TRICOR) 145 MG tablet    Sig: Take 1 tablet (145 mg total) by mouth daily.    Dispense:  90 tablet    Refill:  1  . atorvastatin (LIPITOR) 80 MG tablet    Sig: Take 1 tablet (80 mg total) by mouth daily.    Dispense:  90 tablet    Refill:  1  . amLODipine (NORVASC) 10 MG tablet     Sig: Take 1 tablet (10 mg total) by mouth daily.    Dispense:  90 tablet  Refill:  1    Referral Orders     Ambulatory referral to Gastroenterology   Electronically signed by: Howard Pouch, DO [place]

## 2020-06-21 NOTE — Patient Instructions (Signed)
Health Maintenance, Female Adopting a healthy lifestyle and getting preventive care are important in promoting health and wellness. Ask your health care provider about:  The right schedule for you to have regular tests and exams.  Things you can do on your own to prevent diseases and keep yourself healthy. What should I know about diet, weight, and exercise? Eat a healthy diet   Eat a diet that includes plenty of vegetables, fruits, low-fat dairy products, and lean protein.  Do not eat a lot of foods that are high in solid fats, added sugars, or sodium. Maintain a healthy weight Body mass index (BMI) is used to identify weight problems. It estimates body fat based on height and weight. Your health care provider can help determine your BMI and help you achieve or maintain a healthy weight. Get regular exercise Get regular exercise. This is one of the most important things you can do for your health. Most adults should:  Exercise for at least 150 minutes each week. The exercise should increase your heart rate and make you sweat (moderate-intensity exercise).  Do strengthening exercises at least twice a week. This is in addition to the moderate-intensity exercise.  Spend less time sitting. Even light physical activity can be beneficial. Watch cholesterol and blood lipids Have your blood tested for lipids and cholesterol at 47 years of age, then have this test every 5 years. Have your cholesterol levels checked more often if:  Your lipid or cholesterol levels are high.  You are older than 47 years of age.  You are at high risk for heart disease. What should I know about cancer screening? Depending on your health history and family history, you may need to have cancer screening at various ages. This may include screening for:  Breast cancer.  Cervical cancer.  Colorectal cancer.  Skin cancer.  Lung cancer. What should I know about heart disease, diabetes, and high blood  pressure? Blood pressure and heart disease  High blood pressure causes heart disease and increases the risk of stroke. This is more likely to develop in people who have high blood pressure readings, are of African descent, or are overweight.  Have your blood pressure checked: ? Every 3-5 years if you are 18-39 years of age. ? Every year if you are 40 years old or older. Diabetes Have regular diabetes screenings. This checks your fasting blood sugar level. Have the screening done:  Once every three years after age 40 if you are at a normal weight and have a low risk for diabetes.  More often and at a younger age if you are overweight or have a high risk for diabetes. What should I know about preventing infection? Hepatitis B If you have a higher risk for hepatitis B, you should be screened for this virus. Talk with your health care provider to find out if you are at risk for hepatitis B infection. Hepatitis C Testing is recommended for:  Everyone born from 1945 through 1965.  Anyone with known risk factors for hepatitis C. Sexually transmitted infections (STIs)  Get screened for STIs, including gonorrhea and chlamydia, if: ? You are sexually active and are younger than 47 years of age. ? You are older than 47 years of age and your health care provider tells you that you are at risk for this type of infection. ? Your sexual activity has changed since you were last screened, and you are at increased risk for chlamydia or gonorrhea. Ask your health care provider if   you are at risk.  Ask your health care provider about whether you are at high risk for HIV. Your health care provider may recommend a prescription medicine to help prevent HIV infection. If you choose to take medicine to prevent HIV, you should first get tested for HIV. You should then be tested every 3 months for as long as you are taking the medicine. Pregnancy  If you are about to stop having your period (premenopausal) and  you may become pregnant, seek counseling before you get pregnant.  Take 400 to 800 micrograms (mcg) of folic acid every day if you become pregnant.  Ask for birth control (contraception) if you want to prevent pregnancy. Osteoporosis and menopause Osteoporosis is a disease in which the bones lose minerals and strength with aging. This can result in bone fractures. If you are 65 years old or older, or if you are at risk for osteoporosis and fractures, ask your health care provider if you should:  Be screened for bone loss.  Take a calcium or vitamin D supplement to lower your risk of fractures.  Be given hormone replacement therapy (HRT) to treat symptoms of menopause. Follow these instructions at home: Lifestyle  Do not use any products that contain nicotine or tobacco, such as cigarettes, e-cigarettes, and chewing tobacco. If you need help quitting, ask your health care provider.  Do not use street drugs.  Do not share needles.  Ask your health care provider for help if you need support or information about quitting drugs. Alcohol use  Do not drink alcohol if: ? Your health care provider tells you not to drink. ? You are pregnant, may be pregnant, or are planning to become pregnant.  If you drink alcohol: ? Limit how much you use to 0-1 drink a day. ? Limit intake if you are breastfeeding.  Be aware of how much alcohol is in your drink. In the [place] one drink equals one 12 oz bottle of beer (355 mL), one 5 oz glass of wine (148 mL), or one 1 oz glass of hard liquor (44 mL). General instructions  Schedule regular health, dental, and eye exams.  Stay current with your vaccines.  Tell your health care provider if: ? You often feel depressed. ? You have ever been abused or do not feel safe at home. Summary  Adopting a healthy lifestyle and getting preventive care are important in promoting health and wellness.  Follow your health care provider's instructions about healthy  diet, exercising, and getting tested or screened for diseases.  Follow your health care provider's instructions on monitoring your cholesterol and blood pressure. This information is not intended to replace advice given to you by your health care provider. Make sure you discuss any questions you have with your health care provider. Document Revised: 08/12/2018 Document Reviewed: 08/12/2018 Elsevier Patient Education  2020 [employer]  

## 2020-06-22 LAB — HEPATITIS C ANTIBODY
Hepatitis C Ab: NONREACTIVE
SIGNAL TO CUT-OFF: 0.01 (ref <1.00)

## 2020-07-03 ENCOUNTER — Telehealth: Payer: Self-pay | Admitting: Family Medicine

## 2020-07-03 NOTE — Telephone Encounter (Addendum)
This is not a new medication or a new dose for patient. Please advise pharmacy she has been on this dose.  Thanks.

## 2020-07-03 NOTE — Telephone Encounter (Signed)
Pharmacy called to verify 120mg  daily dose. Stated this medication is normally titrated. Please call pharmacy to confirm. [phone] option1.

## 2020-07-03 NOTE — Telephone Encounter (Signed)
Spoke with and confirmed that pt was on 80 mg of the propanolol prior to the 120 mg. Pt recently switched pharmacies.  FYI

## 2020-07-06 ENCOUNTER — Other Ambulatory Visit: Payer: Self-pay | Admitting: Family Medicine

## 2020-07-26 ENCOUNTER — Encounter: Payer: Self-pay | Admitting: Gastroenterology

## 2020-09-21 ENCOUNTER — Other Ambulatory Visit: Payer: Self-pay | Admitting: Family Medicine

## 2020-09-21 DIAGNOSIS — E785 Hyperlipidemia, unspecified: Secondary | ICD-10-CM

## 2020-09-21 DIAGNOSIS — I10 Essential (primary) hypertension: Secondary | ICD-10-CM

## 2020-09-28 ENCOUNTER — Encounter: Payer: Commercial Managed Care - PPO | Admitting: Gastroenterology

## 2020-10-02 ENCOUNTER — Other Ambulatory Visit: Payer: Self-pay | Admitting: Family Medicine

## 2020-10-23 ENCOUNTER — Other Ambulatory Visit (HOSPITAL_COMMUNITY): Payer: Self-pay | Admitting: Interventional Radiology

## 2020-10-23 DIAGNOSIS — I671 Cerebral aneurysm, nonruptured: Secondary | ICD-10-CM

## 2020-11-17 ENCOUNTER — Encounter: Payer: Commercial Managed Care - PPO | Admitting: Gastroenterology

## 2020-11-20 ENCOUNTER — Ambulatory Visit (HOSPITAL_COMMUNITY)
Admission: RE | Admit: 2020-11-20 | Discharge: 2020-11-20 | Disposition: A | Discharge status: Home / Self Care | Payer: Commercial Managed Care - PPO | Source: Ambulatory Visit | Attending: Interventional Radiology | Admitting: Interventional Radiology

## 2020-11-20 ENCOUNTER — Other Ambulatory Visit: Payer: Self-pay

## 2020-11-20 DIAGNOSIS — I671 Cerebral aneurysm, nonruptured: Secondary | ICD-10-CM | POA: Diagnosis present

## 2020-11-21 ENCOUNTER — Other Ambulatory Visit (HOSPITAL_COMMUNITY): Payer: Self-pay | Admitting: Interventional Radiology

## 2020-11-21 DIAGNOSIS — I671 Cerebral aneurysm, nonruptured: Secondary | ICD-10-CM

## 2020-11-24 LAB — HM MAMMOGRAPHY

## 2020-12-01 ENCOUNTER — Ambulatory Visit (HOSPITAL_COMMUNITY)
Admission: RE | Admit: 2020-12-01 | Discharge: 2020-12-01 | Disposition: A | Discharge status: Home / Self Care | Payer: Commercial Managed Care - PPO | Source: Ambulatory Visit | Attending: Interventional Radiology | Admitting: Interventional Radiology

## 2020-12-01 ENCOUNTER — Other Ambulatory Visit: Payer: Self-pay

## 2020-12-01 DIAGNOSIS — I671 Cerebral aneurysm, nonruptured: Secondary | ICD-10-CM

## 2020-12-05 HISTORY — PX: IR RADIOLOGIST EVAL & MGMT: IMG5224

## 2020-12-06 ENCOUNTER — Encounter: Payer: Self-pay | Admitting: Family Medicine

## 2020-12-06 ENCOUNTER — Other Ambulatory Visit: Payer: Self-pay

## 2020-12-06 ENCOUNTER — Ambulatory Visit (INDEPENDENT_AMBULATORY_CARE_PROVIDER_SITE_OTHER): Payer: Commercial Managed Care - PPO | Admitting: Family Medicine

## 2020-12-06 VITALS — BP 109/72 | HR 73 | Temp 98.2°F | Ht 66.0 in | Wt 262.0 lb

## 2020-12-06 DIAGNOSIS — E785 Hyperlipidemia, unspecified: Secondary | ICD-10-CM

## 2020-12-06 DIAGNOSIS — R7303 Prediabetes: Secondary | ICD-10-CM | POA: Diagnosis not present

## 2020-12-06 DIAGNOSIS — I10 Essential (primary) hypertension: Secondary | ICD-10-CM

## 2020-12-06 DIAGNOSIS — I671 Cerebral aneurysm, nonruptured: Secondary | ICD-10-CM

## 2020-12-06 DIAGNOSIS — F418 Other specified anxiety disorders: Secondary | ICD-10-CM

## 2020-12-06 DIAGNOSIS — R600 Localized edema: Secondary | ICD-10-CM

## 2020-12-06 DIAGNOSIS — N1831 Chronic kidney disease, stage 3a: Secondary | ICD-10-CM

## 2020-12-06 LAB — POCT GLYCOSYLATED HEMOGLOBIN (HGB A1C)
HbA1c POC (<> result, manual entry): 5.3 %
HbA1c, POC (controlled diabetic range): 5.3 % (ref 0.0–7.0)
HbA1c, POC (prediabetic range): 5.3 % — AB (ref 5.7–6.4)
Hemoglobin A1C: 5.3 % (ref 4.0–5.6)

## 2020-12-06 MED ORDER — AMLODIPINE BESYLATE 10 MG PO TABS: 10.0000 mg | ORAL_TABLET | Freq: Every day | ORAL | 1 refills | Status: DC

## 2020-12-06 MED ORDER — ATORVASTATIN CALCIUM 80 MG PO TABS: 80.0000 mg | ORAL_TABLET | Freq: Every day | ORAL | 1 refills | Status: DC

## 2020-12-06 MED ORDER — PROPRANOLOL HCL ER 120 MG PO CP24: 120.0000 mg | ORAL_CAPSULE | Freq: Every day | ORAL | 1 refills | Status: DC

## 2020-12-06 MED ORDER — FENOFIBRATE 145 MG PO TABS: 145.0000 mg | ORAL_TABLET | Freq: Every day | ORAL | 1 refills | Status: DC

## 2020-12-06 NOTE — Patient Instructions (Signed)
a1c and BP look great, Good job! Next appt in October 21-28 as physical.

## 2020-12-06 NOTE — Progress Notes (Signed)
This visit occurred during the SARS-CoV-2 public health emergency.  Safety protocols were in place, including screening questions prior to the visit, additional usage of staff PPE, and extensive cleaning of exam room while observing appropriate contact time as indicated for disinfecting solutions.    Patient ID: [name], female  DOB: 16-Dec-1970, 50 y.o.   MRN: [id number] Patient Care Team    Relationship Specialty Notifications Start End  Ma Hillock, DO PCP - General Family Medicine  05/01/15   Dan Humphreys  Nurse Practitioner  12/26/15   Molli Posey, MD Consulting Physician Obstetrics and Gynecology  09/13/16   Nevada Crane, MD Consulting Physician Psychiatry  12/21/19     Chief Complaint  Patient presents with  . Follow-up    [facility]; pt is fasting     Subjective: [name] is a 50 y.o.  Female  present for [facility] Hypertension/Hyperlipidemia/LE edema:   She reports compliance  with her Inderal LA 120, Lipitor, tricor and amlodipine 10. She has started the lasix 20 mg QD PRN-not taking often, with only mild improvement in LE edema. Patient denies chest pain, shortness of breath, dizziness or lower extremity edema.  Pt is  prescribed statin and taking fish oil 3600 mg daily.  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  Diet: low salt  Exercise: exercising routinely RF: HLD, stable coiled aneurysm, HTN, Fhx   Depression screen [facility] 2/9 06/21/2020 03/19/2019 09/23/2018 03/17/2018 09/17/2017  Decreased Interest 0 1 1 0 1  Down, Depressed, Hopeless 0 1 1 0 1  PHQ - 2 Score 0 2 2 0 2  Altered sleeping - 0 1 1 0  Tired, decreased energy - 1 1 1 1   Change in appetite - 2 0 - 1  Feeling bad or failure about yourself  - 2 1 1 1   Trouble concentrating - 0 0 0 0  Moving slowly or fidgety/restless - 1 0 0 0  Suicidal thoughts - 0 0 0 0  PHQ-9 Score - 8 5 3 5   Difficult doing work/chores - Somewhat difficult Somewhat  difficult Not difficult at all Not difficult at all  Some encounter information is confidential and restricted. Go to Review Flowsheets activity to see all data.  Some recent data might be hidden   Immunization History  Administered Date(s) Administered  . Influenza Split 05/18/2011  . Influenza,inj,Quad PF,6+ Mos 07/09/2013, 05/04/2014, 05/11/2015, 05/29/2018  . Influenza-Unspecified 06/20/2017, 05/14/2019, 05/12/2020  . PFIZER(Purple Top)SARS-COV-2 Vaccination 11/29/2019, 12/24/2019, 08/04/2020  . Td 09/02/2005  . Tdap 09/13/2016    Past Medical History:  Diagnosis Date  . Anxiety    takes Ativan daily as needed  . Arthritis   . Depression    psychiatry  . GERD (gastroesophageal reflux disease)   . Headache(784.0)   . Hyperlipidemia    takes Atorvastatin daily  . Hypertension    takes Amlodipine and Propranolol daily  . Insomnia    takes Trazodone at bedtime  . PONV (postoperative nausea and vomiting)   . 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 Ace Inhibitors Swelling and Other (See Comments)    Angioedema    Past Surgical History:  Procedure Laterality Date  . BRAIN SURGERY     aneurysm coiling  . DILATION AND CURETTAGE OF UTERUS  2009  . IR RADIOLOGIST EVAL & MGMT  12/05/2020  . LAPAROTOMY    . MYOMECTOMY  05/16/2011   Procedure: MYOMECTOMY;  Surgeon: Margarette Asal;  Location: [facility];  Service: Gynecology;  Laterality: N/A;  abdominal  . RADIOLOGY WITH ANESTHESIA N/A 02/18/2013   Procedure: RADIOLOGY WITH ANESTHESIA;  Surgeon: Rob Hickman, MD;  Location: [place];  Service: Radiology;  Laterality: N/A;  . RADIOLOGY WITH ANESTHESIA N/A 01/26/2014   Procedure: RADIOLOGY WITH ANESTHESIA;  Surgeon: Rob Hickman, MD;  Location: [place];  Service: Radiology;  Laterality: N/A;  . RADIOLOGY WITH ANESTHESIA N/A 07/25/2014   Procedure: RADIOLOGY WITH ANESTHESIA;  Surgeon: Rob Hickman, MD;  Location: [facility];  Service: Radiology;  Laterality: N/A;   Family History  Problem Relation Age of Onset  . Heart disease Mother 76       cad  . Lupus Mother   . COPD Mother   . Arthritis Mother        rheumatoid  . AVM Mother   . Diabetes Mother   . Bipolar disorder Mother   . Drug abuse Mother   . Fibromyalgia Mother   . Diabetes Father   . Cancer Father        thyroid, lung  . Depression Sister   . Diabetes Sister   . Hyperlipidemia Sister   . Hypertension Sister   . Cancer Paternal Grandfather        colon  . Alcohol abuse Paternal Uncle   . Drug abuse Maternal Grandfather   . Alcohol abuse Maternal Grandfather    Social History   Social History Narrative   Lives with husband   Caffeine- 2-12 oz cans daily    Allergies as of 12/06/2020      Reactions   Ace Inhibitors Swelling, Other (See Comments)   Angioedema       Medication List       Accurate as of December 06, 2020  8:33 AM. If you have any questions, ask your nurse or doctor.        STOP taking these medications   Ajovy 225 MG/1.5ML Soaj Generic drug: Fremanezumab-vfrm Stopped by: Howard Pouch, DO   prazosin 2 MG capsule Commonly known as: MINIPRESS Stopped by: Howard Pouch, DO   Viibryd 40 MG Tabs Generic drug: Vilazodone HCl Stopped by: Howard Pouch, DO   zolpidem 12.5 MG CR tablet Commonly known as: AMBIEN CR Stopped by: Howard Pouch, DO     TAKE these medications   amLODipine 10 MG tablet Commonly known as: NORVASC Take 1 tablet (10 mg total) by mouth daily.   aspirin EC 325 MG tablet Take 325 mg by mouth daily.   atorvastatin 80 MG tablet Commonly known as: LIPITOR Take 1 tablet (80 mg total) by mouth daily.   baclofen 10 MG tablet Commonly known as: LIORESAL Take 1 tablet by mouth 3 (three) times daily as needed.   clonazePAM 0.5 MG tablet Commonly known as: KLONOPIN Take 0.5-1 mg by mouth 2 (two) times daily. 0.5mg  in the mornnig, 1mg  in the evening    DULoxetine 30 MG capsule Commonly known as: CYMBALTA Take 90 mg by mouth.   Eszopiclone 3 MG Tabs Take 3 mg by mouth at bedtime.   fenofibrate 145 MG tablet Commonly known as: TRICOR Take 1 tablet (145 mg total) by mouth daily.   FISH OIL PO Take 1 capsule by mouth daily.   Fluocinolone Acetonide Scalp 0.01 % Oil Apply topically once a week.   ketoconazole 2 % shampoo Commonly known  as: NIZORAL Apply topically once a week.   Mirena (52 MG) 20 MCG/24HR IUD Generic drug: levonorgestrel Mirena 20 mcg/24 hours (6 yrs) 52 mg intrauterine device  Take 1 device by intrauterine route.   multivitamin with minerals Tabs tablet Take 1 tablet by mouth daily.   Nurtec 75 MG Tbdp Generic drug: Rimegepant Sulfate SMARTSIG:1 Tablet(s) Sublingual Every Other Day   prochlorperazine 10 MG tablet Commonly known as: COMPAZINE Take 1 tablet by mouth 3 (three) times daily as needed.   propranolol ER 120 MG 24 hr capsule Commonly known as: INDERAL LA Take 1 capsule (120 mg total) by mouth daily.   QUEtiapine Fumarate 150 MG 24 hr tablet Commonly known as: SEROQUEL XR Take 150 mg by mouth at bedtime.   triamcinolone 0.1 % Commonly known as: KENALOG Apply topically 2 (two) times daily.       All past medical history, surgical history, allergies, family history, immunizations andmedications were updated in the EMR today and reviewed under the history and medication portions of their EMR.     Recent Results (from the past 2160 hour(s))  POCT HgB A1C     Status: Abnormal   Collection Time: 12/06/20  8:14 AM  Result Value Ref Range   Hemoglobin A1C 5.3 4.0 - 5.6 %   HbA1c POC (<> result, manual entry) 5.3 4.0 - 5.6 %   HbA1c, POC (prediabetic range) 5.3 (A) 5.7 - 6.4 %   HbA1c, POC (controlled diabetic range) 5.3 0.0 - 7.0 %    MR ANGIO HEAD WO CONTRAST  Result Date: 04/03/2020 IMPRESSION: Unchanged appearance of treated anterior communicating artery aneurysm with a small amount  of residual flow related enhancement at the right posterior aspect of the coil mass. Electronically Signed   By: Ulyses Jarred M.D.   On: 04/03/2020 23:57     ROS: 14 pt review of systems performed and negative (unless mentioned in an HPI)  Objective: BP 109/72   Pulse 73   Temp 98.2 F (36.8 C) (Oral)   Ht 5\' 6"  (1.676 m)   Wt 262 lb (118.8 kg)   SpO2 96%   BMI 42.29 kg/m  Gen: Afebrile. No acute distress. Nontoxic, pleasant female.  HENT: AT. Lupus. MM Eyes:Pupils Equal Round Reactive to light, Extraocular movements intact,  Conjunctiva without redness, discharge or icterus. Neck/lymp/endocrine: Supple,no lymphadenopathy, no thyromegaly CV: RRR no murmur, no edema, +2/4 P posterior tibialis pulses Chest: CTAB, no wheeze or crackles Abd: Soft. NTND. BS present  Skin: no rashes, purpura or petechiae.  Neuro: Normal gait. PERLA. EOMi. Alert. Oriented x3 Psych: Normal affect, dress and demeanor. Normal speech. Normal thought content and judgment.  No exam data present  Assessment/plan: TERRIAH REGGIO is a 50 y.o. female present for [facility] Essential hypertension/HLD/morbid obesity Anterior cerebral artery aneurysm/extremity edema - stable.  - continue  amlodipine 10  - continue propranolol 120 mg -Low-sodium diet. Diet and exercise modifications discussed today.  -continue   Lipitor 80 mg daily -Continue Lasix 20 mg daily as needed. Rarely needs.  -Use compression stockings.  Weight loss can also be helpful with lower extremity edema - make certain fish oil supplement is 3-4g daily. - continue fenofibrate - continue follow ups with IR for aneurysm> stable.  F/u 5.5 mos  Prediabetes Has been working on diet - POCT HgB A1C 6.0> 5.3 today! That is great!  Depression with anxiety Managed by psychiatry team  Stage 3a chronic kidney disease ([place]) Continues to improve over time since lithium was  stopped.  Continue to monitor yearly   Return in about 7 months (around  06/22/2021) for CPE (30 min), [facility] (30 min). and 1 yr CPE   Orders Placed This Encounter  Procedures  . POCT HgB A1C   Meds ordered this encounter  Medications  . propranolol ER (INDERAL LA) 120 MG 24 hr capsule    Sig: Take 1 capsule (120 mg total) by mouth daily.    Dispense:  90 capsule    Refill:  1    This prescription was filled on 10/02/2020. Any refills authorized will be placed on file.  . fenofibrate (TRICOR) 145 MG tablet    Sig: Take 1 tablet (145 mg total) by mouth daily.    Dispense:  90 tablet    Refill:  1  . atorvastatin (LIPITOR) 80 MG tablet    Sig: Take 1 tablet (80 mg total) by mouth daily.    Dispense:  90 tablet    Refill:  1  . amLODipine (NORVASC) 10 MG tablet    Sig: Take 1 tablet (10 mg total) by mouth daily.    Dispense:  90 tablet    Refill:  1   Referral Orders  No referral(s) requested today     Electronically signed by: Howard Pouch, [place]

## 2021-01-02 ENCOUNTER — Other Ambulatory Visit: Payer: Self-pay

## 2021-01-02 ENCOUNTER — Ambulatory Visit (AMBULATORY_SURGERY_CENTER): Payer: Commercial Managed Care - PPO | Admitting: *Deleted

## 2021-01-02 VITALS — Ht 66.25 in | Wt 254.0 lb

## 2021-01-02 DIAGNOSIS — Z1211 Encounter for screening for malignant neoplasm of colon: Secondary | ICD-10-CM

## 2021-01-02 MED ORDER — PEG-KCL-NACL-NASULF-NA ASC-C 100 G PO SOLR: 1.0000 | Freq: Once | ORAL | 0 refills | Status: AC

## 2021-01-02 NOTE — Progress Notes (Signed)
Patient's pre-visit was done today over the phone with the patient due to COVID-19 pandemic. Name,DOB and address verified. Insurance verified. Patient denies any allergies to Eggs and Soy. Patient denies any problems with anesthesia/sedation. Patient denies taking diet pills or blood thinners. Packet of Prep instructions mailed to patient including a copy of a consent form-pt is aware. Patient understands to call us back with any questions or concerns. Patient is aware of our care-partner policy and UKGUR-42 safety protocol. EMMI education assigned to the patient for the procedure, sent to [place]. The patient is COVID-19 vaccinated, per patient.

## 2021-01-16 ENCOUNTER — Encounter: Payer: Self-pay | Admitting: Gastroenterology

## 2021-01-16 ENCOUNTER — Other Ambulatory Visit: Payer: Self-pay

## 2021-01-16 ENCOUNTER — Ambulatory Visit (AMBULATORY_SURGERY_CENTER): Payer: Commercial Managed Care - PPO | Admitting: Gastroenterology

## 2021-01-16 VITALS — BP 117/86 | HR 71 | Temp 97.5°F | Resp 16 | Ht 66.25 in | Wt 254.0 lb

## 2021-01-16 DIAGNOSIS — Z1211 Encounter for screening for malignant neoplasm of colon: Secondary | ICD-10-CM

## 2021-01-16 DIAGNOSIS — D120 Benign neoplasm of cecum: Secondary | ICD-10-CM | POA: Diagnosis not present

## 2021-01-16 DIAGNOSIS — K621 Rectal polyp: Secondary | ICD-10-CM

## 2021-01-16 DIAGNOSIS — D125 Benign neoplasm of sigmoid colon: Secondary | ICD-10-CM | POA: Diagnosis not present

## 2021-01-16 DIAGNOSIS — D128 Benign neoplasm of rectum: Secondary | ICD-10-CM

## 2021-01-16 HISTORY — PX: COLONOSCOPY: SHX174

## 2021-01-16 MED ORDER — SODIUM CHLORIDE 0.9 % IV SOLN
500.0000 mL | Freq: Once | INTRAVENOUS | Status: DC
Start: 2021-01-16 — End: 2021-01-16

## 2021-01-16 NOTE — Op Note (Signed)
[facility] Patient Name: [name] Procedure Date: 01/16/2021 8:24 AM MRN: [id number] Endoscopist: Remo Lipps P. Havery Moros , MD Age: 50 Referring MD:  Date of Birth: Nov 16, 1970 Gender: Female Account #: [id number] Procedure:                Colonoscopy Indications:              Screening for colorectal malignant neoplasm, This                            is the patient's first colonoscopy Medicines:                Monitored Anesthesia Care Procedure:                Pre-Anesthesia Assessment:                           - Prior to the procedure, a History and Physical                            was performed, and patient medications and                            allergies were reviewed. The patient's tolerance of                            previous anesthesia was also reviewed. The risks                            and benefits of the procedure and the sedation                            options and risks were discussed with the patient.                            All questions were answered, and informed consent                            was obtained. Prior Anticoagulants: The patient has                            taken no previous anticoagulant or antiplatelet                            agents. ASA Grade Assessment: III - A patient with                            severe systemic disease. After reviewing the risks                            and benefits, the patient was deemed in                            satisfactory condition to undergo the procedure.  After obtaining informed consent, the colonoscope                            was passed under direct vision. Throughout the                            procedure, the patient's blood pressure, pulse, and                            oxygen saturations were monitored continuously. The                            Olympus [id number]-[id number] (#[id number]) Colonoscope was                            introduced  through the anus and advanced to the the                            cecum, identified by appendiceal orifice and                            ileocecal valve. The colonoscopy was performed                            without difficulty. The patient tolerated the                            procedure well. The quality of the bowel                            preparation was good. The ileocecal valve,                            appendiceal orifice, and rectum were photographed. Scope In: 8:34:24 AM Scope Out: 9:01:41 AM Scope Withdrawal Time: 0 hours 21 minutes 20 seconds  Total Procedure Duration: 0 hours 27 minutes 17 seconds  Findings:                 Skin tags were found on perianal exam.                           Two sessile polyps were found in the cecum. The                            polyps were 2 to 3 mm in size. These polyps were                            removed with a cold snare. Resection and retrieval                            were complete.                           A 3 mm polyp was found in the ileocecal valve.  The                            polyp was flat. The polyp was removed with a cold                            snare. Resection and retrieval were complete.                           There was a medium-sized lipoma, in the ascending                            colon.                           A 4 mm polyp was found in the sigmoid colon. The                            polyp was sessile. The polyp was removed with a                            cold snare. Resection and retrieval were complete.                           A 3 mm polyp was found in the rectum. The polyp was                            sessile. The polyp was removed with a cold snare.                            Resection and retrieval were complete.                           Internal hemorrhoids were found during retroflexion.                           The colon was tortuous with looping in the right                             colon, led to abdominal pressure to achieve cecal                            intubation.                           The exam was otherwise without abnormality. Complications:            No immediate complications. Estimated blood loss:                            Minimal. Estimated Blood Loss:     Estimated blood loss was minimal. Impression:               - Perianal skin tags found on perianal exam.                           -  Two 2 to 3 mm polyps in the cecum, removed with a                            cold snare. Resected and retrieved.                           - One 3 mm polyp at the ileocecal valve, removed                            with a cold snare. Resected and retrieved.                           - Medium-sized lipoma in the ascending colon.                           - One 4 mm polyp in the sigmoid colon, removed with                            a cold snare. Resected and retrieved.                           - One 3 mm polyp in the rectum, removed with a cold                            snare. Resected and retrieved.                           - Internal hemorrhoids.                           - Tortuous colon with looping in right colon.                           - The examination was otherwise normal. Recommendation:           - Patient has a contact number available for                            emergencies. The signs and symptoms of potential                            delayed complications were discussed with the                            patient. Return to normal activities tomorrow.                            Written discharge instructions were provided to the                            patient.                           - Resume previous diet.                           -  Continue present medications.                           - Await pathology results. Remo Lipps P. Racine Erby, MD 01/16/2021 9:11:17 AM This report has been signed electronically.

## 2021-01-16 NOTE — Progress Notes (Signed)
PT taken to PACU. Monitors in place. VSS. Report given to RN. 

## 2021-01-16 NOTE — Progress Notes (Signed)
Called to room to assist during endoscopic procedure.  Patient ID and intended procedure confirmed with present staff. Received instructions for my participation in the procedure from the performing physician.  

## 2021-01-16 NOTE — Patient Instructions (Signed)
Handout given for polyps.  YOU HAD AN ENDOSCOPIC PROCEDURE TODAY AT THE [facility]:   Refer to the procedure report that was given to you for any specific questions about what was found during the examination.  If the procedure report does not answer your questions, please call your gastroenterologist to clarify.  If you requested that your care partner not be given the details of your procedure findings, then the procedure report has been included in a sealed envelope for you to review at your convenience later.  YOU SHOULD EXPECT: Some feelings of bloating in the abdomen. Passage of more gas than usual.  Walking can help get rid of the air that was put into your GI tract during the procedure and reduce the bloating. If you had a lower endoscopy (such as a colonoscopy or flexible sigmoidoscopy) you may notice spotting of blood in your stool or on the toilet paper. If you underwent a bowel prep for your procedure, you may not have a normal bowel movement for a few days.  Please Note:  You might notice some irritation and congestion in your nose or some drainage.  This is from the oxygen used during your procedure.  There is no need for concern and it should clear up in a day or so.  SYMPTOMS TO REPORT IMMEDIATELY:   Following lower endoscopy (colonoscopy or flexible sigmoidoscopy):  Excessive amounts of blood in the stool  Significant tenderness or worsening of abdominal pains  Swelling of the abdomen that is new, acute  Fever of 100F or higher  For urgent or emergent issues, a gastroenterologist can be reached at any hour by calling [phone]. Do not use MyChart messaging for urgent concerns.    DIET:  We do recommend a small meal at first, but then you may proceed to your regular diet.  Drink plenty of fluids but you should avoid alcoholic beverages for 24 hours.  ACTIVITY:  You should plan to take it easy for the rest of today and you should NOT DRIVE or use heavy  machinery until tomorrow (because of the sedation medicines used during the test).    FOLLOW UP: Our staff will call the number listed on your records 48-72 hours following your procedure to check on you and address any questions or concerns that you may have regarding the information given to you following your procedure. If we do not reach you, we will leave a message.  We will attempt to reach you two times.  During this call, we will ask if you have developed any symptoms of COVID 19. If you develop any symptoms (ie: fever, flu-like symptoms, shortness of breath, cough etc.) before then, please call [phone].  If you test positive for Covid 19 in the 2 weeks post procedure, please call and report this information to us.    If any biopsies were taken you will be contacted by phone or by letter within the next 1-3 weeks.  Please call us at [phone] if you have not heard about the biopsies in 3 weeks.    SIGNATURES/CONFIDENTIALITY: You and/or your care partner have signed paperwork which will be entered into your electronic medical record.  These signatures attest to the fact that that the information above on your After Visit Summary has been reviewed and is understood.  Full responsibility of the confidentiality of this discharge information lies with you and/or your care-partner. 

## 2021-01-16 NOTE — Progress Notes (Signed)
Pt's states no medical or surgical changes since previsit or office visit.   VS taken by CW 

## 2021-01-18 ENCOUNTER — Telehealth: Payer: Self-pay

## 2021-01-18 NOTE — Telephone Encounter (Signed)
First attempt follow up call to pt, lm on vm 

## 2021-01-18 NOTE — Telephone Encounter (Signed)
Second attempt follow up call to pt, lm on vm. 

## 2021-02-09 ENCOUNTER — Ambulatory Visit (INDEPENDENT_AMBULATORY_CARE_PROVIDER_SITE_OTHER): Payer: Commercial Managed Care - PPO | Admitting: Family Medicine

## 2021-02-09 ENCOUNTER — Other Ambulatory Visit: Payer: Self-pay

## 2021-02-09 ENCOUNTER — Encounter: Payer: Self-pay | Admitting: Family Medicine

## 2021-02-09 VITALS — BP 115/80 | HR 66 | Temp 97.8°F | Wt 255.0 lb

## 2021-02-09 DIAGNOSIS — M7918 Myalgia, other site: Secondary | ICD-10-CM

## 2021-02-09 DIAGNOSIS — M25559 Pain in unspecified hip: Secondary | ICD-10-CM | POA: Diagnosis not present

## 2021-02-09 DIAGNOSIS — I729 Aneurysm of unspecified site: Secondary | ICD-10-CM | POA: Diagnosis not present

## 2021-02-09 MED ORDER — NAPROXEN 500 MG PO TABS: 500.0000 mg | ORAL_TABLET | Freq: Two times a day (BID) | ORAL | 0 refills | Status: DC

## 2021-02-09 MED ORDER — CYCLOBENZAPRINE HCL 10 MG PO TABS: 10.0000 mg | ORAL_TABLET | Freq: Three times a day (TID) | ORAL | 0 refills | Status: DC | PRN

## 2021-02-09 NOTE — Patient Instructions (Signed)
Gluteal Strain  A gluteal strain happens when the muscles in the buttocks (gluteal muscles) are overstretched or torn. A tear can be partial or complete. A gluteal strain can cause pain and stiffness in your buttocks, legs, and lower back. Astrain might also be called "pulling a muscle." The severity of a gluteal strain is rated as Grade 1, 2, or 3. A Grade 3 strainhas the most tearing and pain. What are the causes? This condition may be caused by: Stretching the muscles too far. Putting too much stress on the muscles before they are warmed up. Overusing the muscles. Repetitive muscle movements over long periods of time. Injury. What increases the risk? The following factors may make you more likely to develop this condition: Being out in cold weather. Being physically tired. Having poor strength and flexibility. Not warming up properly before physical activity. Exercising or playing sports with sudden bursts of activity. Having poor exercise techniques. What are the signs or symptoms? Symptoms of this condition include: Pain in the buttocks, especially when moving the legs. Pain may spread to the lower back or to the legs. Bruising and swelling of the buttocks. Tenderness, weakness, or stiffness in the buttocks. Muscle spasms. How is this diagnosed? This condition is diagnosed based on a physical exam and medical history. Your health care provider may do some range of motion exercises with you. You may have tests, such as MRI, ultrasound, or X-rays. Your strain may be rated based on how severe it is. The ratings are: Grade 1 strain (mild). Your muscles are overstretched. You might have very small muscle tears. This type of strain generally heals in about one week. Grade 2 strain (moderate). Your muscles are partially torn. This may take one to two months to heal. Grade 3 strain (severe). Your muscles are completely torn. A severe strain can take more than three months to heal. Grade  3 gluteal strains are rare. How is this treated? This condition may be treated with: Resting, icing, and raising (elevating) the injured area as much as possible. Over-the-counter pain medicines. If your gluteal strain is severe or very painful, your health care provider may prescribe pain medicines or physical therapy. Surgery for severe strains israre. Follow these instructions at home: Managing pain, stiffness, and swelling  If directed, put ice on the injured area: Put ice in a plastic bag. Place a towel between your skin and the bag. Leave the ice on for 20 minutes, 2-3 times per day.  Activity Do not drive or use heavy machinery while taking prescription pain medicine. Return to your normal activities as told by your health care provider. Ask your health care provider what activities are safe for you. Rest your gluteal muscles as much as possible, especially for the first 2-3 days. Begin exercising or stretching as told by your health care provider. General instructions Take over-the-counter and prescription medicines only as told by your health care provider. Follow your treatment plan as told by your health care provider. This may include: Physical therapy. Massage. Local electrical stimulation (transcutaneous electrical nerve stimulation, TENS). Keep all follow-up visits as told by your health care provider. This is important. How is this prevented? Warm up and stretch before physical activity. Stretch after physical activity. Learn and use correct techniques for exercising and playing sports. Avoid difficult physical activities if your muscles are tired or sore. Strengthen your gluteal muscles and the surrounding muscles. Develop your flexibility by stretching at least once a day. Contact a health care provider if  you have: Pain or swelling that gets worse or does not get better with medicine. A sudden, sharp pain that moves through your leg. Get help right away if  you: Develop weakness in part of your body. Lose feeling in part of your body. Have severe pain. Are unable to walk. Have difficulty controlling your bladder or bowel movements. Summary A gluteal strain happens when the muscles in the buttocks (gluteal muscles) are overstretched or torn. A gluteal strain can cause pain and stiffness in your buttocks, legs, and lower back. If your gluteal strain is severe or very painful, your health care provider may prescribe pain medicines or physical therapy. This information is not intended to replace advice given to you by your health care provider. Make sure you discuss any questions you have with your healthcare provider. Document Revised: 12/10/2018 Document Reviewed: 01/12/2018 Elsevier Patient Education  [place].

## 2021-02-09 NOTE — Progress Notes (Signed)
This visit occurred during the SARS-CoV-2 public health emergency.  Safety protocols were in place, including screening questions prior to the visit, additional usage of staff PPE, and extensive cleaning of exam room while observing appropriate contact time as indicated for disinfecting solutions.    [name] , May 13, 1971, 50 y.o., female MRN: [id number] Patient Care Team    Relationship Specialty Notifications Start End  Ma Hillock, DO PCP - General Family Medicine  05/01/15   Dan Humphreys  Nurse Practitioner  12/26/15   Molli Posey, MD Consulting Physician Obstetrics and Gynecology  09/13/16   [place] Crane, MD Consulting Physician Psychiatry  12/21/19     Chief Complaint  Patient presents with   Hip Pain    Pt c/o R hip pain x 8 hours      Subjective: Pt presents for an OV with complaints of right posterior hip pain of 8 hours duration.  Associated symptoms include pain with weightbearing and walking.  Patient reports she was in the shower this morning and was trying to reach over to the side to touch the heel of her foot and she felt a pop and pain in the ipsilateral hip.  She reports taking Advil and baclofen this morning to help with the discomfort and it did ease off the pain temporarily.   Depression screen [facility] 2/9 06/21/2020 03/19/2019 09/23/2018 03/17/2018 09/17/2017  Decreased Interest 0 1 1 0 1  Down, Depressed, Hopeless 0 1 1 0 1  PHQ - 2 Score 0 2 2 0 2  Altered sleeping - 0 1 1 0  Tired, decreased energy - 1 1 1 1   Change in appetite - 2 0 - 1  Feeling bad or failure about yourself  - 2 1 1 1   Trouble concentrating - 0 0 0 0  Moving slowly or fidgety/restless - 1 0 0 0  Suicidal thoughts - 0 0 0 0  PHQ-9 Score - 8 5 3 5   Difficult doing work/chores - Somewhat difficult Somewhat difficult Not difficult at all Not difficult at all  Some encounter information is confidential and restricted. Go to Review Flowsheets activity to see all data.   Some recent data might be hidden    Allergies  Allergen Reactions   Ace Inhibitors Swelling and Other (See Comments)    Angioedema    Social History   Social History Narrative   Lives with husband   Caffeine- 2-12 oz cans daily   Past Medical History:  Diagnosis Date   Anxiety    takes Ativan daily as needed   Arthritis    Cancer ([place])    skin pre-cancer mole   Depression    psychiatry   GERD (gastroesophageal reflux disease)    Headache(784.0)    Hyperlipidemia    takes Atorvastatin daily   Hypertension    takes Amlodipine and Propranolol daily   Insomnia    takes Trazodone at bedtime   PONV (postoperative nausea and vomiting)    SAH (subarachnoid hemorrhage) ([place]) 2014   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   Sleep apnea    MILD   Past Surgical History:  Procedure Laterality Date   BRAIN SURGERY     aneurysm coiling   DILATION AND CURETTAGE OF UTERUS  2009   IR RADIOLOGIST EVAL & MGMT  12/05/2020   LAPAROTOMY     MYOMECTOMY  05/16/2011   Procedure: MYOMECTOMY;  Surgeon: Margarette Asal;  Location: [facility];  Service: Gynecology;  Laterality: N/A;  abdominal   RADIOLOGY WITH ANESTHESIA N/A 02/18/2013   Procedure: RADIOLOGY WITH ANESTHESIA;  Surgeon: Rob Hickman, MD;  Location: [facility];  Service: Radiology;  Laterality: N/A;   RADIOLOGY WITH ANESTHESIA N/A 01/26/2014   Procedure: RADIOLOGY WITH ANESTHESIA;  Surgeon: Rob Hickman, MD;  Location: [facility];  Service: Radiology;  Laterality: N/A;   RADIOLOGY WITH ANESTHESIA N/A 07/25/2014   Procedure: RADIOLOGY WITH ANESTHESIA;  Surgeon: Rob Hickman, MD;  Location: [facility];  Service: Radiology;  Laterality: N/A;   Family History  Problem Relation Age of Onset   Heart disease Mother 23       cad   Lupus Mother    COPD Mother    Arthritis Mother        rheumatoid   AVM Mother    Diabetes Mother    Bipolar disorder Mother    Drug abuse Mother    Fibromyalgia Mother    Colon polyps Mother     Diabetes Father    Cancer Father        thyroid, lung   Depression Sister    Diabetes Sister    Hyperlipidemia Sister    Hypertension Sister    Cancer Paternal Grandfather        colon   Colon cancer Paternal Grandfather    Alcohol abuse Paternal Uncle    Drug abuse Maternal Grandfather    Alcohol abuse Maternal Grandfather    Esophageal cancer Neg Hx    Stomach cancer Neg Hx    Rectal cancer Neg Hx    Allergies as of 02/09/2021       Reactions   Ace Inhibitors Swelling, Other (See Comments)   Angioedema         Medication List        Accurate as of February 09, 2021  4:13 PM. If you have any questions, ask your nurse or doctor.          STOP taking these medications    baclofen 10 MG tablet Commonly known as: LIORESAL Stopped by: Howard Pouch, DO       TAKE these medications    amLODipine 10 MG tablet Commonly known as: NORVASC Take 1 tablet (10 mg total) by mouth daily.   aspirin EC 325 MG tablet Take 325 mg by mouth daily.   atorvastatin 80 MG tablet Commonly known as: LIPITOR Take 1 tablet (80 mg total) by mouth daily.   clonazePAM 0.5 MG tablet Commonly known as: KLONOPIN Take 0.5-1 mg by mouth 2 (two) times daily. 0.5mg  in the mornnig, 1mg  in the evening   cyclobenzaprine 10 MG tablet Commonly known as: FLEXERIL Take 1 tablet (10 mg total) by mouth 3 (three) times daily as needed for muscle spasms. Started by: Howard Pouch, DO   DULoxetine 30 MG capsule Commonly known as: CYMBALTA Take 90 mg by mouth.   esomeprazole 20 MG packet Commonly known as: NEXIUM Take 20 mg by mouth daily before breakfast.   Eszopiclone 3 MG Tabs Take 3 mg by mouth at bedtime.   fenofibrate 145 MG tablet Commonly known as: TRICOR Take 1 tablet (145 mg total) by mouth daily.   fexofenadine 60 MG tablet Commonly known as: ALLEGRA Take 60 mg by mouth daily.   FISH OIL PO Take 1 capsule by mouth daily.   Fluocinolone Acetonide Scalp 0.01 % Oil Apply  topically once a week.   ketoconazole 2 % shampoo Commonly known as: NIZORAL Apply topically once a week.  Mirena (52 MG) 20 MCG/DAY Iud Generic drug: levonorgestrel Mirena 20 mcg/24 hours (6 yrs) 52 mg intrauterine device  Take 1 device by intrauterine route.   multivitamin with minerals Tabs tablet Take 1 tablet by mouth daily.   naproxen 500 MG tablet Commonly known as: Naprosyn Take 1 tablet (500 mg total) by mouth 2 (two) times daily with a meal. Started by: Howard Pouch, DO   Nurtec 75 MG Tbdp Generic drug: Rimegepant Sulfate SMARTSIG:1 Tablet(s) Sublingual Every Other Day   prochlorperazine 10 MG tablet Commonly known as: COMPAZINE Take 1 tablet by mouth 3 (three) times daily as needed.   propranolol ER 120 MG 24 hr capsule Commonly known as: INDERAL LA Take 1 capsule (120 mg total) by mouth daily.   QUEtiapine Fumarate 150 MG 24 hr tablet Commonly known as: SEROQUEL XR Take 150 mg by mouth at bedtime.   triamcinolone cream 0.1 % Commonly known as: KENALOG Apply topically 2 (two) times daily.        All past medical history, surgical history, allergies, family history, immunizations andmedications were updated in the EMR today and reviewed under the history and medication portions of their EMR.     ROS: Negative, with the exception of above mentioned in HPI   Objective:  BP 115/80   Pulse 66   Temp 97.8 F (36.6 C) (Oral)   Wt 255 lb (115.7 kg)   SpO2 97%   BMI 40.85 kg/m  Body mass index is 40.85 kg/m. Gen: Afebrile. No acute distress. Nontoxic in appearance, well developed, well nourished.  HENT: AT. [place].  Eyes:Pupils Equal Round Reactive to light, Extraocular movements intact,  Conjunctiva without redness, discharge or icterus. MSK: No lumbar spine tenderness.  No lumbar paraspinal muscle tenderness or fullness.  No SI joint pain bilaterally.  No tenderness to palpation over piriformis.  Mild tenderness to palpation lateral aspect of right  buttock.  Negative straight leg raises bilaterally.  FABRE-Right causes lateral posterior hip area pain.  Gluteal pain with resisted ABD duction of lower extremity.  Mild weakness in right abduction lower extremity.  Neurovascularly intact distally. Skin: no rashes, purpura or petechiae.  Neuro: Walking with a limp. PERLA. EOMi. Alert. Oriented x3  Psych: Normal affect, dress and demeanor. Normal speech. Normal thought content and judgment.  No results found. No results found. No results found for this or any previous visit (from the past 24 hour(s)).  Assessment/Plan: [name] is a 50 y.o. female present for OV for  Hip pain/gluteal pain Concern for possible gluteal ligament tear/detachment versus gluteal strain.  Possible mild iliolumbar ligament strain as well. Encouraged her to go home and ice area for 15-20 minutes a few times a day over the weekend, then switch over to heat application. Start naproxen twice daily with food for 7 days Start Flexeril 5-10 mg 3 times daily as needed, encouraged her to try to take for at least 5 days Rest. Follow-up in 2 weeks if not seeing improvement in pain, sooner if worsening need to further evaluate and considering imaging study at that time.  Aneurysm ([place]), anterior cerebral Stable.  Follows routinely with IR neurology    Reviewed expectations re: course of current medical issues. Discussed self-management of symptoms. Outlined signs and symptoms indicating need for more acute intervention. Patient verbalized understanding and all questions were answered. Patient received an After-Visit Summary.    No orders of the defined types were placed in this encounter.  Meds ordered this encounter  Medications  cyclobenzaprine (FLEXERIL) 10 MG tablet    Sig: Take 1 tablet (10 mg total) by mouth 3 (three) times daily as needed for muscle spasms.    Dispense:  30 tablet    Refill:  0   naproxen (NAPROSYN) 500 MG tablet    Sig: Take  1 tablet (500 mg total) by mouth 2 (two) times daily with a meal.    Dispense:  30 tablet    Refill:  0    Referral Orders  No referral(s) requested today     Note is dictated utilizing voice recognition software. Although note has been proof read prior to signing, occasional typographical errors still can be missed. If any questions arise, please do not hesitate to call for verification.   electronically signed by:  Howard Pouch, DO  [place]

## 2021-02-23 ENCOUNTER — Ambulatory Visit: Payer: Commercial Managed Care - PPO | Admitting: Family Medicine

## 2021-03-29 ENCOUNTER — Other Ambulatory Visit: Payer: Self-pay | Admitting: Family Medicine

## 2021-03-29 DIAGNOSIS — E785 Hyperlipidemia, unspecified: Secondary | ICD-10-CM

## 2021-03-29 DIAGNOSIS — I10 Essential (primary) hypertension: Secondary | ICD-10-CM

## 2021-04-02 DIAGNOSIS — U071 COVID-19: Secondary | ICD-10-CM

## 2021-04-02 HISTORY — DX: COVID-19: U07.1

## 2021-06-25 ENCOUNTER — Encounter: Payer: Commercial Managed Care - PPO | Admitting: Family Medicine

## 2021-07-09 ENCOUNTER — Other Ambulatory Visit: Payer: Self-pay | Admitting: Family Medicine

## 2021-07-09 DIAGNOSIS — E785 Hyperlipidemia, unspecified: Secondary | ICD-10-CM

## 2021-07-10 ENCOUNTER — Other Ambulatory Visit: Payer: Self-pay

## 2021-07-10 DIAGNOSIS — I10 Essential (primary) hypertension: Secondary | ICD-10-CM

## 2021-07-10 MED ORDER — AMLODIPINE BESYLATE 10 MG PO TABS: 10.0000 mg | ORAL_TABLET | Freq: Every day | ORAL | 0 refills | Status: DC

## 2021-07-23 ENCOUNTER — Other Ambulatory Visit: Payer: Self-pay | Admitting: Family Medicine

## 2021-07-23 DIAGNOSIS — E785 Hyperlipidemia, unspecified: Secondary | ICD-10-CM

## 2021-08-06 ENCOUNTER — Other Ambulatory Visit: Payer: Self-pay | Admitting: Family Medicine

## 2021-08-06 DIAGNOSIS — I10 Essential (primary) hypertension: Secondary | ICD-10-CM

## 2021-08-07 ENCOUNTER — Other Ambulatory Visit: Payer: Self-pay | Admitting: Family Medicine

## 2021-08-07 DIAGNOSIS — I10 Essential (primary) hypertension: Secondary | ICD-10-CM

## 2021-08-09 ENCOUNTER — Other Ambulatory Visit: Payer: Self-pay | Admitting: Family Medicine

## 2021-08-09 DIAGNOSIS — I10 Essential (primary) hypertension: Secondary | ICD-10-CM

## 2021-08-10 ENCOUNTER — Other Ambulatory Visit: Payer: Self-pay | Admitting: Family Medicine

## 2021-08-10 ENCOUNTER — Telehealth: Payer: Self-pay | Admitting: Family Medicine

## 2021-08-10 DIAGNOSIS — I10 Essential (primary) hypertension: Secondary | ICD-10-CM

## 2021-08-10 NOTE — Telephone Encounter (Signed)
Spoke with pharmacy and gave verbal of what was sent on Monday. Pharmacy will call pt.

## 2021-08-10 NOTE — Telephone Encounter (Signed)
Pt informed that her medication has been sent 08/06/21.

## 2021-08-13 ENCOUNTER — Other Ambulatory Visit: Payer: Self-pay | Admitting: Family Medicine

## 2021-08-13 DIAGNOSIS — E785 Hyperlipidemia, unspecified: Secondary | ICD-10-CM

## 2021-08-21 ENCOUNTER — Other Ambulatory Visit: Payer: Self-pay

## 2021-08-22 ENCOUNTER — Encounter: Payer: Self-pay | Admitting: Family Medicine

## 2021-08-22 ENCOUNTER — Ambulatory Visit (INDEPENDENT_AMBULATORY_CARE_PROVIDER_SITE_OTHER): Payer: Commercial Managed Care - PPO | Admitting: Family Medicine

## 2021-08-22 VITALS — BP 102/67 | HR 61 | Temp 97.8°F | Ht 65.25 in | Wt 262.0 lb

## 2021-08-22 DIAGNOSIS — I10 Essential (primary) hypertension: Secondary | ICD-10-CM | POA: Diagnosis not present

## 2021-08-22 DIAGNOSIS — Z975 Presence of (intrauterine) contraceptive device: Secondary | ICD-10-CM

## 2021-08-22 DIAGNOSIS — N1831 Chronic kidney disease, stage 3a: Secondary | ICD-10-CM

## 2021-08-22 DIAGNOSIS — E538 Deficiency of other specified B group vitamins: Secondary | ICD-10-CM | POA: Diagnosis not present

## 2021-08-22 DIAGNOSIS — E782 Mixed hyperlipidemia: Secondary | ICD-10-CM | POA: Diagnosis not present

## 2021-08-22 DIAGNOSIS — I671 Cerebral aneurysm, nonruptured: Secondary | ICD-10-CM | POA: Diagnosis not present

## 2021-08-22 DIAGNOSIS — E8881 Metabolic syndrome: Secondary | ICD-10-CM

## 2021-08-22 DIAGNOSIS — R7989 Other specified abnormal findings of blood chemistry: Secondary | ICD-10-CM | POA: Diagnosis not present

## 2021-08-22 DIAGNOSIS — Z0000 Encounter for general adult medical examination without abnormal findings: Secondary | ICD-10-CM | POA: Diagnosis not present

## 2021-08-22 DIAGNOSIS — R7303 Prediabetes: Secondary | ICD-10-CM

## 2021-08-22 DIAGNOSIS — E785 Hyperlipidemia, unspecified: Secondary | ICD-10-CM

## 2021-08-22 DIAGNOSIS — I729 Aneurysm of unspecified site: Secondary | ICD-10-CM

## 2021-08-22 LAB — COMPREHENSIVE METABOLIC PANEL
ALT: 202 U/L — ABNORMAL HIGH (ref 0–35)
AST: 185 U/L — ABNORMAL HIGH (ref 0–37)
Albumin: 4.2 g/dL (ref 3.5–5.2)
Alkaline Phosphatase: 47 U/L (ref 39–117)
BUN: 10 mg/dL (ref 6–23)
CO2: 29 mEq/L (ref 19–32)
Calcium: 9.4 mg/dL (ref 8.4–10.5)
Chloride: 100 mEq/L (ref 96–112)
Creatinine, Ser: 0.95 mg/dL (ref 0.40–1.20)
GFR: 71.09 mL/min (ref >60.00)
Glucose, Bld: 86 mg/dL (ref 70–99)
Potassium: 3.8 mEq/L (ref 3.5–5.1)
Sodium: 137 mEq/L (ref 135–145)
Total Bilirubin: 0.6 mg/dL (ref 0.2–1.2)
Total Protein: 6.8 g/dL (ref 6.0–8.3)

## 2021-08-22 LAB — CBC
HCT: 37.2 % (ref 36.0–46.0)
Hemoglobin: 12.2 g/dL (ref 12.0–15.0)
MCHC: 32.9 g/dL (ref 30.0–36.0)
MCV: 95.3 fl (ref 78.0–100.0)
Platelets: 350 10*3/uL (ref 150.0–400.0)
RBC: 3.9 Mil/uL (ref 3.87–5.11)
RDW: 13 % (ref 11.5–15.5)
WBC: 7.8 10*3/uL (ref 4.0–10.5)

## 2021-08-22 LAB — LIPID PANEL
Cholesterol: 197 mg/dL (ref 0–200)
HDL: 60.9 mg/dL (ref >39.00)
LDL Cholesterol: 110 mg/dL — ABNORMAL HIGH (ref 0–99)
NonHDL: 135.79
Total CHOL/HDL Ratio: 3
Triglycerides: 127 mg/dL (ref 0.0–149.0)
VLDL: 25.4 mg/dL (ref 0.0–40.0)

## 2021-08-22 LAB — HEMOGLOBIN A1C: Hgb A1c MFr Bld: 5.8 % (ref 4.6–6.5)

## 2021-08-22 LAB — VITAMIN D 25 HYDROXY (VIT D DEFICIENCY, FRACTURES): VITD: 44.84 ng/mL (ref 30.00–100.00)

## 2021-08-22 LAB — TSH: TSH: 5.61 u[IU]/mL — ABNORMAL HIGH (ref 0.35–5.50)

## 2021-08-22 LAB — VITAMIN B12: Vitamin B-12: 370 pg/mL (ref 211–911)

## 2021-08-22 MED ORDER — PROPRANOLOL HCL ER 120 MG PO CP24
120.0000 mg | ORAL_CAPSULE | Freq: Every day | ORAL | 1 refills | Status: DC
Start: 2021-08-22 — End: 2022-02-11

## 2021-08-22 MED ORDER — FENOFIBRATE 145 MG PO TABS: 145.0000 mg | ORAL_TABLET | Freq: Every day | ORAL | 1 refills | Status: DC

## 2021-08-22 MED ORDER — ATORVASTATIN CALCIUM 80 MG PO TABS
80.0000 mg | ORAL_TABLET | Freq: Every day | ORAL | 3 refills | Status: DC
Start: 2021-08-22 — End: 2022-02-12

## 2021-08-22 MED ORDER — AMLODIPINE BESYLATE 10 MG PO TABS: 10.0000 mg | ORAL_TABLET | Freq: Every day | ORAL | 1 refills | Status: DC

## 2021-08-22 NOTE — Patient Instructions (Signed)
°Great to see you today.  °I have refilled the medication(s) we provide.  ° °If labs were collected, we will inform you of lab results once received either by echart message or telephone call.  ° - echart message- for normal results that have been seen by the patient already.  ° - telephone call: abnormal results or if patient has not viewed results in their echart. ° °Health Maintenance, Female °Adopting a healthy lifestyle and getting preventive care are important in promoting health and wellness. Ask your health care provider about: °The right schedule for you to have regular tests and exams. °Things you can do on your own to prevent diseases and keep yourself healthy. °What should I know about diet, weight, and exercise? °Eat a healthy diet ° °Eat a diet that includes plenty of vegetables, fruits, low-fat dairy products, and lean protein. °Do not eat a lot of foods that are high in solid fats, added sugars, or sodium. °Maintain a healthy weight °Body mass index (BMI) is used to identify weight problems. It estimates body fat based on height and weight. Your health care provider can help determine your BMI and help you achieve or maintain a healthy weight. °Get regular exercise °Get regular exercise. This is one of the most important things you can do for your health. Most adults should: °Exercise for at least 150 minutes each week. The exercise should increase your heart rate and make you sweat (moderate-intensity exercise). °Do strengthening exercises at least twice a week. This is in addition to the moderate-intensity exercise. °Spend less time sitting. Even light physical activity can be beneficial. °Watch cholesterol and blood lipids °Have your blood tested for lipids and cholesterol at 48 years of age, then have this test every 5 years. °Have your cholesterol levels checked more often if: °Your lipid or cholesterol levels are high. °You are older than 48 years of age. °You are at high risk for heart  disease. °What should I know about cancer screening? °Depending on your health history and family history, you may need to have cancer screening at various ages. This may include screening for: °Breast cancer. °Cervical cancer. °Colorectal cancer. °Skin cancer. °Lung cancer. °What should I know about heart disease, diabetes, and high blood pressure? °Blood pressure and heart disease °High blood pressure causes heart disease and increases the risk of stroke. This is more likely to develop in people who have high blood pressure readings or are overweight. °Have your blood pressure checked: °Every 3-5 years if you are 18-39 years of age. °Every year if you are 40 years old or older. °Diabetes °Have regular diabetes screenings. This checks your fasting blood sugar level. Have the screening done: °Once every three years after age 40 if you are at a normal weight and have a low risk for diabetes. °More often and at a younger age if you are overweight or have a high risk for diabetes. °What should I know about preventing infection? °Hepatitis B °If you have a higher risk for hepatitis B, you should be screened for this virus. Talk with your health care provider to find out if you are at risk for hepatitis B infection. °Hepatitis C °Testing is recommended for: °Everyone born from 1945 through 1965. °Anyone with known risk factors for hepatitis C. °Sexually transmitted infections (STIs) °Get screened for STIs, including gonorrhea and chlamydia, if: °You are sexually active and are younger than 48 years of age. °You are older than 48 years of age and your health care provider   tells you that you are at risk for this type of infection. °Your sexual activity has changed since you were last screened, and you are at increased risk for chlamydia or gonorrhea. Ask your health care provider if you are at risk. °Ask your health care provider about whether you are at high risk for HIV. Your health care provider may recommend a  prescription medicine to help prevent HIV infection. If you choose to take medicine to prevent HIV, you should first get tested for HIV. You should then be tested every 3 months for as long as you are taking the medicine. °Pregnancy °If you are about to stop having your period (premenopausal) and you may become pregnant, seek counseling before you get pregnant. °Take 400 to 800 micrograms (mcg) of folic acid every day if you become pregnant. °Ask for birth control (contraception) if you want to prevent pregnancy. °Osteoporosis and menopause °Osteoporosis is a disease in which the bones lose minerals and strength with aging. This can result in bone fractures. If you are 65 years old or older, or if you are at risk for osteoporosis and fractures, ask your health care provider if you should: °Be screened for bone loss. °Take a calcium or vitamin D supplement to lower your risk of fractures. °Be given hormone replacement therapy (HRT) to treat symptoms of menopause. °Follow these instructions at home: °Alcohol use °Do not drink alcohol if: °Your health care provider tells you not to drink. °You are pregnant, may be pregnant, or are planning to become pregnant. °If you drink alcohol: °Limit how much you have to: °0-1 drink a day. °Know how much alcohol is in your drink. In the [place] one drink equals one 12 oz bottle of beer (355 mL), one 5 oz glass of wine (148 mL), or one 1½ oz glass of hard liquor (44 mL). °Lifestyle °Do not use any products that contain nicotine or tobacco. These products include cigarettes, chewing tobacco, and vaping devices, such as e-cigarettes. If you need help quitting, ask your health care provider. °Do not use street drugs. °Do not share needles. °Ask your health care provider for help if you need support or information about quitting drugs. °General instructions °Schedule regular health, dental, and eye exams. °Stay current with your vaccines. °Tell your health care provider if: °You often  feel depressed. °You have ever been abused or do not feel safe at home. °Summary °Adopting a healthy lifestyle and getting preventive care are important in promoting health and wellness. °Follow your health care provider's instructions about healthy diet, exercising, and getting tested or screened for diseases. °Follow your health care provider's instructions on monitoring your cholesterol and blood pressure. °This information is not intended to replace advice given to you by your health care provider. Make sure you discuss any questions you have with your health care provider. °Document Revised: 01/08/2021 Document Reviewed: 01/08/2021 °Elsevier Patient Education © 2022 [employer] ° °

## 2021-08-22 NOTE — Progress Notes (Signed)
This visit occurred during the SARS-CoV-2 public health emergency.  Safety protocols were in place, including screening questions prior to the visit, additional usage of staff PPE, and extensive cleaning of exam room while observing appropriate contact time as indicated for disinfecting solutions.    Patient ID: [name], female  DOB: [birth year], 50 y.o.   MRN: [id number] Patient Care Team    Relationship Specialty Notifications Start End  Ma Hillock, DO PCP - General Family Medicine  05/01/15   Dan Humphreys  Nurse Practitioner  12/26/15   Molli Posey, MD Consulting Physician Obstetrics and Gynecology  09/13/16   [place] Crane, MD Consulting Physician Psychiatry  12/21/19   Armbruster, Carlota Raspberry, MD Consulting Physician Gastroenterology  08/22/21     Chief Complaint  Patient presents with   Annual Exam    Pt is not fasting    Subjective: [name] is a 50 y.o.  Female  present for CPE . All past medical history, surgical history, allergies, family history, immunizations, medications and social history were updated in the electronic medical record today. All recent labs, ED visits and hospitalizations within the last year were reviewed.  Health maintenance:  Colonoscopy: PGF colon cancer history (80). Screen completed 12/2020 - 3 yr followup for polyps. Dr. Havery Moros Mammogram: 10/2020, normal, has completed at OB/GYN office. Dr. Matthew Saras.  Cervical cancer screening: 01/2020- GYN Immunizations: tdap 09/2016 UTD, Influenza UTD 2022 (encouraged yearly), covid series completed.  Infectious disease screening: HIV completed and hep c completed DEXA: routine screen at 62 Assistive device: none Oxygen IRS:WNIO Patient has a Dental home. Hospitalizations/ED visits: reviewed  Hypertension/Hyperlipidemia/LE edema:   She reports compliance  with her Inderal LA 120, Lipitor, tricor and amlodipine 10. She has started the lasix 20 mg QD PRN-not taking often, with  only mild improvement in LE edema. Patient denies chest pain, shortness of breath, dizziness or lower extremity edema.  Pt is  prescribed statin and tricor,  taking fish oil 3600 mg daily.  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  Diet: low salt  Exercise: exercising routinely RF: HLD, stable coiled aneurysm, HTN, Fhx    Depression screen [facility] 2/9 06/21/2020 03/19/2019 09/23/2018 03/17/2018 09/17/2017  Decreased Interest 0 1 1 0 1  Down, Depressed, Hopeless 0 1 1 0 1  PHQ - 2 Score 0 2 2 0 2  Altered sleeping - 0 1 1 0  Tired, decreased energy - 1 1 1 1   Change in appetite - 2 0 - 1  Feeling bad or failure about yourself  - 2 1 1 1   Trouble concentrating - 0 0 0 0  Moving slowly or fidgety/restless - 1 0 0 0  Suicidal thoughts - 0 0 0 0  PHQ-9 Score - 8 5 3 5   Difficult doing work/chores - Somewhat difficult Somewhat difficult Not difficult at all Not difficult at all  Some encounter information is confidential and restricted. Go to Review Flowsheets activity to see all data.  Some recent data might be hidden   GAD 7 : Generalized Anxiety Score 03/19/2019 09/23/2018 03/17/2018 09/17/2017  Nervous, Anxious, on Edge 2 1 0 1  Control/stop worrying 3 0 0 0  Worry too much - different things 3 1 0 3  Trouble relaxing 1 0 0 1  Restless 1 0 1 0  Easily annoyed or irritable 1 0 1 0  Afraid - awful might happen 1 1  0 0  Total GAD 7 Score 12 3 2 5   Anxiety Difficulty Somewhat difficult Somewhat difficult Not difficult at all Not difficult at all  Some encounter information is confidential and restricted. Go to Review Flowsheets activity to see all data.    Immunization History  Administered Date(s) Administered   Influenza Split 05/18/2011   Influenza,inj,Quad PF,6+ Mos 07/09/2013, 05/04/2014, 05/11/2015, 05/29/2018   Influenza-Unspecified 06/20/2017, 05/14/2019, 05/12/2020, 05/11/2021   PFIZER(Purple Top)SARS-COV-2 Vaccination 11/29/2019,  12/24/2019, 08/04/2020   Td 09/02/2005   Tdap 09/13/2016     Past Medical History:  Diagnosis Date   Anxiety    takes Ativan daily as needed   Arthritis    Cancer ([place])    skin pre-cancer mole   Depression    psychiatry   GERD (gastroesophageal reflux disease)    Headache(784.0)    Hyperlipidemia    takes Atorvastatin daily   Hypertension    takes Amlodipine and Propranolol daily   Insomnia    takes Trazodone at bedtime   PONV (postoperative nausea and vomiting)    SAH (subarachnoid hemorrhage) ([place]) 2014   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   Sleep apnea    MILD   Allergies  Allergen Reactions   Ace Inhibitors Swelling and Other (See Comments)    Angioedema    Past Surgical History:  Procedure Laterality Date   BRAIN SURGERY     aneurysm coiling   DILATION AND CURETTAGE OF UTERUS  2009   IR RADIOLOGIST EVAL & MGMT  12/05/2020   LAPAROTOMY     MYOMECTOMY  05/16/2011   Procedure: MYOMECTOMY;  Surgeon: Margarette Asal;  Location: [facility];  Service: Gynecology;  Laterality: N/A;  abdominal   RADIOLOGY WITH ANESTHESIA N/A 02/18/2013   Procedure: RADIOLOGY WITH ANESTHESIA;  Surgeon: Rob Hickman, MD;  Location: [facility];  Service: Radiology;  Laterality: N/A;   RADIOLOGY WITH ANESTHESIA N/A 01/26/2014   Procedure: RADIOLOGY WITH ANESTHESIA;  Surgeon: Rob Hickman, MD;  Location: [facility];  Service: Radiology;  Laterality: N/A;   RADIOLOGY WITH ANESTHESIA N/A 07/25/2014   Procedure: RADIOLOGY WITH ANESTHESIA;  Surgeon: Rob Hickman, MD;  Location: [facility];  Service: Radiology;  Laterality: N/A;   Family History  Problem Relation Age of Onset   Heart disease Mother 62       cad   Lupus Mother    COPD Mother    Arthritis Mother        rheumatoid   AVM Mother    Diabetes Mother    Bipolar disorder Mother    Drug abuse Mother    Fibromyalgia Mother    Colon polyps Mother    Diabetes Father    Cancer Father        thyroid, lung   Depression  Sister    Diabetes Sister    Hyperlipidemia Sister    Hypertension Sister    Cancer Paternal Grandfather        colon   Colon cancer Paternal Grandfather    Alcohol abuse Paternal Uncle    Drug abuse Maternal Grandfather    Alcohol abuse Maternal Grandfather    Esophageal cancer Neg Hx    Stomach cancer Neg Hx    Rectal cancer Neg Hx    Social History   Social History Narrative   Lives with husband   Caffeine- 2-12 oz cans daily    Allergies as of 08/22/2021       Reactions   Ace Inhibitors Swelling, Other (  See Comments)   Angioedema         Medication List        Accurate as of August 22, 2021  1:17 PM. If you have any questions, ask your nurse or doctor.          STOP taking these medications    cyclobenzaprine 10 MG tablet Commonly known as: FLEXERIL Stopped by: Howard Pouch, DO   naproxen 500 MG tablet Commonly known as: Naprosyn Stopped by: Howard Pouch, DO       TAKE these medications    amLODipine 10 MG tablet Commonly known as: NORVASC Take 1 tablet (10 mg total) by mouth daily.   aspirin EC 325 MG tablet Take 325 mg by mouth daily.   atorvastatin 80 MG tablet Commonly known as: LIPITOR Take 1 tablet (80 mg total) by mouth daily.   clonazePAM 0.5 MG tablet Commonly known as: KLONOPIN Take 0.5-1 mg by mouth 2 (two) times daily. 0.5mg  in the mornnig, 1mg  in the evening   DULoxetine 30 MG capsule Commonly known as: CYMBALTA Take 90 mg by mouth.   esomeprazole 20 MG packet Commonly known as: NEXIUM Take 20 mg by mouth daily before breakfast.   Eszopiclone 3 MG Tabs Take 3 mg by mouth at bedtime.   fenofibrate 145 MG tablet Commonly known as: TRICOR Take 1 tablet (145 mg total) by mouth daily.   fexofenadine 60 MG tablet Commonly known as: ALLEGRA Take 60 mg by mouth daily.   FISH OIL PO Take 1 capsule by mouth daily.   Fluocinolone Acetonide Scalp 0.01 % Oil Apply topically once a week.   ketoconazole 2 %  shampoo Commonly known as: NIZORAL Apply topically once a week.   Mirena (52 MG) 20 MCG/DAY Iud Generic drug: levonorgestrel Mirena 20 mcg/24 hours (6 yrs) 52 mg intrauterine device  Take 1 device by intrauterine route.   multivitamin with minerals Tabs tablet Take 1 tablet by mouth daily.   Nurtec 75 MG Tbdp Generic drug: Rimegepant Sulfate SMARTSIG:1 Tablet(s) Sublingual Every Other Day   prochlorperazine 10 MG tablet Commonly known as: COMPAZINE Take 1 tablet by mouth 3 (three) times daily as needed.   propranolol ER 120 MG 24 hr capsule Commonly known as: INDERAL LA Take 1 capsule (120 mg total) by mouth daily.   QUEtiapine Fumarate 150 MG 24 hr tablet Commonly known as: SEROQUEL XR Take 150 mg by mouth at bedtime.   triamcinolone cream 0.1 % Commonly known as: KENALOG Apply topically 2 (two) times daily.        All past medical history, surgical history, allergies, family history, immunizations andmedications were updated in the EMR today and reviewed under the history and medication portions of their EMR.     No results found for this or any previous visit (from the past 2160 hour(s)).  No results found.   ROS 14 pt review of systems performed and negative (unless mentioned in an HPI)  Objective:  BP 102/67    Pulse 61    Temp 97.8 F (36.6 C) (Oral)    Ht 5' 5.25" (1.657 m)    Wt 262 lb (118.8 kg)    SpO2 100%    BMI 43.27 kg/m   Physical Exam Vitals and nursing note reviewed.  Constitutional:      General: She is not in acute distress.    Appearance: Normal appearance. She is obese. She is not ill-appearing or toxic-appearing.  HENT:     Head: Normocephalic and atraumatic.  Right Ear: Tympanic membrane, ear canal and external ear normal. There is no impacted cerumen.     Left Ear: Tympanic membrane, ear canal and external ear normal. There is no impacted cerumen.     Nose: No congestion or rhinorrhea.     Mouth/Throat:     Mouth: Mucous  membranes are moist.     Pharynx: Oropharynx is clear. No oropharyngeal exudate or posterior oropharyngeal erythema.  Eyes:     General: No scleral icterus.       Right eye: No discharge.        Left eye: No discharge.     Extraocular Movements: Extraocular movements intact.     Conjunctiva/sclera: Conjunctivae normal.     Pupils: Pupils are equal, round, and reactive to light.  Cardiovascular:     Rate and Rhythm: Normal rate and regular rhythm.     Pulses: Normal pulses.     Heart sounds: Normal heart sounds. No murmur heard.   No friction rub. No gallop.  Pulmonary:     Effort: Pulmonary effort is normal. No respiratory distress.     Breath sounds: Normal breath sounds. No stridor. No wheezing, rhonchi or rales.  Chest:     Chest wall: No tenderness.  Abdominal:     General: Abdomen is flat. Bowel sounds are normal. There is no distension.     Palpations: Abdomen is soft. There is no mass.     Tenderness: There is no abdominal tenderness. There is no right CVA tenderness, left CVA tenderness, guarding or rebound.     Hernia: No hernia is present.  Musculoskeletal:        General: No swelling, tenderness or deformity. Normal range of motion.     Cervical back: Normal range of motion and neck supple. No rigidity or tenderness.     Right lower leg: No edema.     Left lower leg: No edema.  Lymphadenopathy:     Cervical: No cervical adenopathy.  Skin:    General: Skin is warm and dry.     Coloration: Skin is not jaundiced or pale.     Findings: No bruising, erythema, lesion or rash.  Neurological:     General: No focal deficit present.     Mental Status: She is alert and oriented to person, place, and time. Mental status is at baseline.     Cranial Nerves: No cranial nerve deficit.     Sensory: No sensory deficit.     Motor: No weakness.     Coordination: Coordination normal.     Gait: Gait normal.     Deep Tendon Reflexes: Reflexes normal.  Psychiatric:        Mood and  Affect: Mood normal.        Behavior: Behavior normal.        Thought Content: Thought content normal.        Judgment: Judgment normal.     No results found.  Assessment/plan: [name] is a 50 y.o. female present for CPE  Essential hypertension/HLD/morbid obesity Anterior cerebral artery aneurysm/extremity edema - stable.  Continue  amlodipine 10  - continue  propranolol 120 mg -Low-sodium diet. Diet and exercise modifications discussed today.  -Continue Lipitor 80 mg daily -Continue Lasix 20 mg daily as needed. Rarely needs.  -Use compression stockings.  Weight loss can also be helpful with lower extremity edema - make certain fish oil supplement is 3-4g daily. - continue fenofibrate - continue follow ups with IR for aneurysm> stable.  Cbc, cmp, tsh and lipids collected today F/u 5.5 mos   Prediabetes Has been working on diet - POCT HgB A1C 6.0> 5.3 t> collected today Depression with anxiety Managed by psychiatry team Stage 3a chronic kidney disease ([place]) Continues to improve over time since lithium was stopped.  Continue to monitor yearly Cmp and vit d collected today Metabolic syndrome - Hemoglobin A1c B12 deficiency - Vitamin B12 Stage 3a chronic kidney disease (H[facility]) - Vitamin D (25 hydroxy) Low serum calcium - Vitamin D (25 hydroxy) Prediabetes - Hemoglobin A1c IUD (intrauterine device) in place Encounter for preventive health examination Colonoscopy: PGF colon cancer history (82). Screen completed 12/2020 - 3 yr followup for polyps. Dr. Havery Moros Mammogram: 10/2020, normal, has completed at OB/GYN office. Dr. Matthew Saras.  Cervical cancer screening: 01/2020- GYN Immunizations: tdap 09/2016 UTD, Influenza UTD 2022 (encouraged yearly), covid series completed.  Infectious disease screening: HIV completed and hep c completed DEXA: routine screen at 62 Patient was encouraged to exercise greater than 150 minutes a week. Patient was encouraged to choose a diet  filled with fresh fruits and vegetables, and lean meats. AVS provided to patient today for education/recommendation on gender specific health and safety maintenance.  Return in about 24 weeks (around 02/06/2022) for CMC (30 min).  Orders Placed This Encounter  Procedures   CBC   Comprehensive metabolic panel   Hemoglobin A1c   Lipid panel   TSH   Vitamin B12   Vitamin D (25 hydroxy)   Meds ordered this encounter  Medications   amLODipine (NORVASC) 10 MG tablet    Sig: Take 1 tablet (10 mg total) by mouth daily.    Dispense:  90 tablet    Refill:  1   atorvastatin (LIPITOR) 80 MG tablet    Sig: Take 1 tablet (80 mg total) by mouth daily.    Dispense:  90 tablet    Refill:  3   fenofibrate (TRICOR) 145 MG tablet    Sig: Take 1 tablet (145 mg total) by mouth daily.    Dispense:  90 tablet    Refill:  1   propranolol ER (INDERAL LA) 120 MG 24 hr capsule    Sig: Take 1 capsule (120 mg total) by mouth daily.    Dispense:  90 capsule    Refill:  1   Referral Orders  No referral(s) requested today     Electronically signed by: Howard Pouch, [place]

## 2021-08-23 ENCOUNTER — Telehealth: Payer: Self-pay | Admitting: Family Medicine

## 2021-08-23 DIAGNOSIS — R7989 Other specified abnormal findings of blood chemistry: Secondary | ICD-10-CM

## 2021-08-23 NOTE — Telephone Encounter (Signed)
Please call patient kidney is normal Blood cell counts and electrolytes are normal Diabetes screening/A1c is normal  Cholesterol panel looks good and is at goal for her. Vitamin D is normal.  B12 is low at 370> I would encourage her to start sublingual B12 1000 mcg daily supplement. Liver enzymes are significantly elevated, these are the ALT and AST.  These can be elevated for many reasons including liver disease, recent illnesses, viruses and autoimmune disorders.  I believe her medications are the same from last collection in October last year in which her liver enzymes were normal.  Unless , one of her mental health medications has changed by that provider?  Thyroid levels are also mildly underactive.  This also could be a response to recent illness or it could be that she is starting to become hypothyroid.  I recommend repeat repeat liver function tests and thyroid tests in 3-4 weeks with additional tests for both liver and thyroid function.  Hopefully they both returned to normal and this was secondary to an acute illness.  Lab appointment only necessary, we will call her with the results.

## 2021-08-23 NOTE — Telephone Encounter (Signed)
LVM for pt to CB regarding results.  

## 2021-08-23 NOTE — Telephone Encounter (Signed)
Spoke with patient regarding results/recommendations.  

## 2021-09-02 DIAGNOSIS — D219 Benign neoplasm of connective and other soft tissue, unspecified: Secondary | ICD-10-CM

## 2021-09-02 HISTORY — DX: Benign neoplasm of connective and other soft tissue, unspecified: D21.9

## 2021-09-13 ENCOUNTER — Ambulatory Visit: Payer: Commercial Managed Care - PPO

## 2021-09-18 ENCOUNTER — Ambulatory Visit (INDEPENDENT_AMBULATORY_CARE_PROVIDER_SITE_OTHER): Payer: Commercial Managed Care - PPO

## 2021-09-18 ENCOUNTER — Other Ambulatory Visit: Payer: Self-pay

## 2021-09-18 DIAGNOSIS — R7989 Other specified abnormal findings of blood chemistry: Secondary | ICD-10-CM | POA: Diagnosis not present

## 2021-09-18 LAB — HEPATIC FUNCTION PANEL
ALT: 51 U/L — ABNORMAL HIGH (ref 0–35)
AST: 32 U/L (ref 0–37)
Albumin: 4.8 g/dL (ref 3.5–5.2)
Alkaline Phosphatase: 59 U/L (ref 39–117)
Bilirubin, Direct: 0.2 mg/dL (ref 0.0–0.3)
Total Bilirubin: 0.8 mg/dL (ref 0.2–1.2)
Total Protein: 7.6 g/dL (ref 6.0–8.3)

## 2021-09-18 LAB — T4, FREE: Free T4: 0.77 ng/dL (ref 0.60–1.60)

## 2021-09-18 LAB — TSH: TSH: 1.82 u[IU]/mL (ref 0.35–5.50)

## 2021-09-18 LAB — C-REACTIVE PROTEIN: CRP: <1 mg/dL (ref 0.5–20.0)

## 2021-09-18 NOTE — Progress Notes (Signed)
Per the orders of Dr. Raoul Pitch pt is here for labs, pt tolerated draw well.

## 2021-09-19 LAB — HEPATITIS PANEL, ACUTE
Hep A IgM: NONREACTIVE
Hep B C IgM: NONREACTIVE
Hepatitis B Surface Ag: NONREACTIVE
Hepatitis C Ab: NONREACTIVE
SIGNAL TO CUT-OFF: 0.1 (ref <1.00)

## 2021-09-19 LAB — THYROID PEROXIDASE ANTIBODY: Thyroperoxidase Ab SerPl-aCnc: <1 IU/mL (ref <9)

## 2021-09-19 LAB — ANTI-SMITH ANTIBODY: ENA SM Ab Ser-aCnc: <1 AI

## 2021-09-19 LAB — HIV ANTIBODY (ROUTINE TESTING W REFLEX): HIV 1&2 Ab, 4th Generation: NONREACTIVE

## 2021-11-16 ENCOUNTER — Other Ambulatory Visit: Payer: Self-pay

## 2021-11-16 ENCOUNTER — Encounter: Payer: Self-pay | Admitting: Family Medicine

## 2021-11-16 ENCOUNTER — Ambulatory Visit (INDEPENDENT_AMBULATORY_CARE_PROVIDER_SITE_OTHER): Payer: Commercial Managed Care - PPO | Admitting: Family Medicine

## 2021-11-16 VITALS — BP 105/70 | HR 69 | Temp 98.3°F | Ht 65.25 in | Wt 262.0 lb

## 2021-11-16 DIAGNOSIS — K1120 Sialoadenitis, unspecified: Secondary | ICD-10-CM

## 2021-11-16 DIAGNOSIS — R220 Localized swelling, mass and lump, head: Secondary | ICD-10-CM | POA: Diagnosis not present

## 2021-11-16 HISTORY — DX: Sialoadenitis, unspecified: K11.20

## 2021-11-16 MED ORDER — LEVOFLOXACIN 750 MG PO TABS: 750.0000 mg | ORAL_TABLET | Freq: Every day | ORAL | 0 refills | Status: DC

## 2021-11-16 MED ORDER — PREDNISONE 20 MG PO TABS: 40.0000 mg | ORAL_TABLET | Freq: Every day | ORAL | 0 refills | Status: DC

## 2021-11-16 MED ORDER — METHYLPREDNISOLONE ACETATE 80 MG/ML IJ SUSP
80.0000 mg | Freq: Once | INTRAMUSCULAR | Status: AC
  Administered 2021-11-16: 80 mg via INTRAMUSCULAR

## 2021-11-16 MED ORDER — METRONIDAZOLE 500 MG PO TABS: 500.0000 mg | ORAL_TABLET | Freq: Three times a day (TID) | ORAL | 0 refills | Status: AC

## 2021-11-16 NOTE — Progress Notes (Signed)
? ? ? ?This visit occurred during the SARS-CoV-2 public health emergency.  Safety protocols were in place, including screening questions prior to the visit, additional usage of staff PPE, and extensive cleaning of exam room while observing appropriate contact time as indicated for disinfecting solutions.  ? ?[name] , 1971/05/30, 51 y.o., female ?MRN: [id number] ?Patient Care Team  ?  Relationship Specialty Notifications Start End  ?Ma Hillock, DO PCP - General Family Medicine  05/01/15   ?Dan Humphreys  Nurse Practitioner  12/26/15   ?Molli Posey, MD Consulting Physician Obstetrics and Gynecology  09/13/16   ?Nevada Crane, MD Consulting Physician Psychiatry  12/21/19   ?Yetta Flock, MD Consulting Physician Gastroenterology  08/22/21   ? ? ?Chief Complaint  ?Patient presents with  ? Facial Swelling  ?  Pt c/o L side facial swelling in the tmj region and L ear x 2 days; pt reports nasal drainage for 2 weeks but inc in the last 2 days;   ? ?  ?Subjective: Pt presents for an OV with complaints of left-sided facial swelling and discomfort of 2-3 days.  She reports her ear started swelling yesterday.  She denies any pain on the inside of her ear or mouth.  She states she has a very mild sore throat.  She denies fevers or chills.  She has never had anything like this in the past. ? ?Depression screen [facility] 2/9 08/22/2021 06/21/2020 03/19/2019 09/23/2018 03/17/2018  ?Decreased Interest 1 0 1 1 0  ?Down, Depressed, Hopeless 2 0 1 1 0  ?PHQ - 2 Score 3 0 2 2 0  ?Altered sleeping 1 - 0 1 1  ?Tired, decreased energy 1 - '1 1 1  '$ ?Change in appetite 2 - 2 0 -  ?Feeling bad or failure about yourself  1 - '2 1 1  '$ ?Trouble concentrating 0 - 0 0 0  ?Moving slowly or fidgety/restless 0 - 1 0 0  ?Suicidal thoughts 1 - 0 0 0  ?PHQ-9 Score 9 - '8 5 3  '$ ?Difficult doing work/chores - - Somewhat difficult Somewhat difficult Not difficult at all  ?Some encounter information is confidential and restricted. Go to  Review Flowsheets activity to see all data.  ?Some recent data might be hidden  ? ?Allergies  ?Allergen Reactions  ? Ace Inhibitors Swelling and Other (See Comments)  ?  Angioedema   ? ?Social History  ? ?Social History Narrative  ? Lives with husband  ? Caffeine- 2-12 oz cans daily  ? ?Past Medical History:  ?Diagnosis Date  ? Anxiety   ? takes Ativan daily as needed  ? Arthritis   ? Cancer [facility])   ? skin pre-cancer mole  ? Depression   ? psychiatry  ? GERD (gastroesophageal reflux disease)   ? Headache(784.0)   ? Hyperlipidemia   ? takes Atorvastatin daily  ? Hypertension   ? takes Amlodipine and Propranolol daily  ? Insomnia   ? takes Trazodone at bedtime  ? PONV (postoperative nausea and vomiting)   ? SAH (subarachnoid hemorrhage) ([place]) 2014  ? 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  ? Sleep apnea   ? MILD  ? ?Past Surgical History:  ?Procedure Laterality Date  ? BRAIN SURGERY    ? aneurysm coiling  ? DILATION AND CURETTAGE OF UTERUS  2009  ? IR RADIOLOGIST EVAL & MGMT  12/05/2020  ? LAPAROTOMY    ? MYOMECTOMY  05/16/2011  ? Procedure: MYOMECTOMY;  Surgeon: Margarette Asal;  Location: [place] [facility];  Service: Gynecology;  Laterality: N/A;  abdominal  ? RADIOLOGY WITH ANESTHESIA N/A 02/18/2013  ? Procedure: RADIOLOGY WITH ANESTHESIA;  Surgeon: Rob Hickman, MD;  Location: [place];  Service: Radiology;  Laterality: N/A;  ? RADIOLOGY WITH ANESTHESIA N/A 01/26/2014  ? Procedure: RADIOLOGY WITH ANESTHESIA;  Surgeon: Rob Hickman, MD;  Location: [place];  Service: Radiology;  Laterality: N/A;  ? RADIOLOGY WITH ANESTHESIA N/A 07/25/2014  ? Procedure: RADIOLOGY WITH ANESTHESIA;  Surgeon: Rob Hickman, MD;  Location: [place];  Service: Radiology;  Laterality: N/A;  ? ?Family History  ?Problem Relation Age of Onset  ? Heart disease Mother 86  ?     cad  ? Lupus Mother   ? COPD Mother   ? Arthritis Mother   ?     rheumatoid  ? AVM Mother   ? Diabetes Mother   ? Bipolar disorder Mother   ? Drug abuse Mother   ?  Fibromyalgia Mother   ? Colon polyps Mother   ? Diabetes Father   ? Cancer Father   ?     thyroid, lung  ? Depression Sister   ? Diabetes Sister   ? Hyperlipidemia Sister   ? Hypertension Sister   ? Cancer Paternal Grandfather   ?     colon  ? Colon cancer Paternal Grandfather   ? Alcohol abuse Paternal Uncle   ? Drug abuse Maternal Grandfather   ? Alcohol abuse Maternal Grandfather   ? Esophageal cancer Neg Hx   ? Stomach cancer Neg Hx   ? Rectal cancer Neg Hx   ? ?Allergies as of 11/16/2021   ? ?   Reactions  ? Ace Inhibitors Swelling, Other (See Comments)  ? Angioedema   ? ?  ? ?  ?Medication List  ?  ? ?  ? Accurate as of November 16, 2021  4:58 PM. If you have any questions, ask your nurse or doctor.  ?  ?  ? ?  ? ?amLODipine 10 MG tablet ?Commonly known as: NORVASC ?Take 1 tablet (10 mg total) by mouth daily. ?  ?aspirin EC 325 MG tablet ?Take 325 mg by mouth daily. ?  ?atorvastatin 80 MG tablet ?Commonly known as: LIPITOR ?Take 1 tablet (80 mg total) by mouth daily. ?  ?baclofen 10 MG tablet ?Commonly known as: LIORESAL ?Take 10 mg by mouth 3 (three) times daily as needed. ?  ?clonazePAM 0.5 MG tablet ?Commonly known as: KLONOPIN ?Take 0.5-1 mg by mouth 2 (two) times daily. 0.'5mg'$  in the mornnig, '1mg'$  in the evening ?  ?DULoxetine 30 MG capsule ?Commonly known as: CYMBALTA ?Take 90 mg by mouth. ?  ?esomeprazole 20 MG packet ?Commonly known as: [place] ?Take 20 mg by mouth daily before breakfast. ?  ?Eszopiclone 3 MG Tabs ?Take 3 mg by mouth at bedtime. ?  ?famotidine-calcium carbonate-magnesium hydroxide 10-800-165 MG chewable tablet ?Commonly known as: PEPCID COMPLETE ?Chew 1 tablet by mouth daily as needed. ?  ?fenofibrate 145 MG tablet ?Commonly known as: TRICOR ?Take 1 tablet (145 mg total) by mouth daily. ?  ?fexofenadine 60 MG tablet ?Commonly known as: ALLEGRA ?Take 60 mg by mouth daily. ?  ?FISH OIL PO ?Take 1 capsule by mouth daily. ?  ?Fluocinolone Acetonide Scalp 0.01 % Oil ?Apply topically once a week. ?   ?ketoconazole 2 % shampoo ?Commonly known as: NIZORAL ?Apply topically once a week. ?  ?levofloxacin 750 MG tablet ?Commonly known as: Levaquin ?Take 1 tablet (750  mg total) by mouth daily. ?Started by: Howard Pouch, DO ?  ?metroNIDAZOLE 500 MG tablet ?Commonly known as: FLAGYL ?Take 1 tablet (500 mg total) by mouth 3 (three) times daily for 10 days. ?Started by: Howard Pouch, DO ?  ?Mirena (52 MG) 20 MCG/DAY Iud ?Generic drug: levonorgestrel ?Mirena 20 mcg/24 hours (6 yrs) 52 mg intrauterine device ? Take 1 device by intrauterine route. ?  ?multivitamin with minerals Tabs tablet ?Take 1 tablet by mouth daily. ?  ?Nurtec 75 MG Tbdp ?Generic drug: Rimegepant Sulfate ?SMARTSIG:1 Tablet(s) Sublingual Every Other Day ?  ?predniSONE 20 MG tablet ?Commonly known as: DELTASONE ?Take 2 tablets (40 mg total) by mouth daily with breakfast. ?Started by: Howard Pouch, DO ?  ?prochlorperazine 10 MG tablet ?Commonly known as: COMPAZINE ?Take 1 tablet by mouth 3 (three) times daily as needed. ?  ?propranolol ER 120 MG 24 hr capsule ?Commonly known as: INDERAL LA ?Take 1 capsule (120 mg total) by mouth daily. ?  ?QUEtiapine Fumarate 150 MG 24 hr tablet ?Commonly known as: SEROQUEL XR ?Take 150 mg by mouth at bedtime. ?  ?triamcinolone cream 0.1 % ?Commonly known as: KENALOG ?Apply topically 2 (two) times daily. ?  ? ?  ? ? ?All past medical history, surgical history, allergies, family history, immunizations andmedications were updated in the EMR today and reviewed under the history and medication portions of their EMR.    ? ?ROS ?Negative, with the exception of above mentioned in HPI ? ? ?Objective:  ?BP 105/70   Pulse 69   Temp 98.3 ?F (36.8 ?C) (Oral)   Ht 5' 5.25" (1.657 m)   Wt 262 lb (118.8 kg)   SpO2 97%   BMI 43.27 kg/m?  ?Body mass index is 43.27 kg/m?Marland Kitchen ?Physical Exam ?Vitals and nursing note reviewed.  ?Constitutional:   ?   General: She is not in acute distress. ?   Appearance: Normal appearance. She is normal  weight. She is not ill-appearing or toxic-appearing.  ?HENT:  ?   Head:  ?   Comments: Left side of face swelling and tenderness anterior auricular, inferior auricular, along left jawline and external ear.  External

## 2021-11-16 NOTE — Addendum Note (Signed)
Addended by: Kavin Leech on: 11/16/2021 06:15 PM ? ? Modules accepted: Orders ? ?

## 2021-11-16 NOTE — Patient Instructions (Signed)
Parotitis °Parotitis is inflammation of one or both of the parotid glands. These glands produce saliva. They are found on each side of the face, below and in front of the earlobes. The saliva that they produce comes out of tiny openings (ducts) inside the cheeks. °Parotitis may cause sudden swelling and pain (acute parotitis). It can also cause repeated episodes of swelling and pain or continued swelling that may or may not be painful (chronic parotitis). °What are the causes? °This condition may be caused by: °Infections from bacteria. °Infections from viruses, such as mumps or HIV. °Blockage (obstruction) of saliva flow through the parotid glands. This can be from a stone, scar tissue, or a tumor. °Diseases that cause your body's defense system (immune system) to attack healthy cells in your salivary glands. These are called autoimmune diseases. °What increases the risk? °You are more likely to develop this condition if: °You are 50 years old or older. °You do not drink enough fluids (are dehydrated). °You drink too much alcohol. °You have: °A dry mouth. °Diabetes. °Gout. °A long-term illness. °You do not take good care of your mouth and teeth (poor oral hygiene). °You have had radiation treatments to the head and neck. °You take certain medicines. °What are the signs or symptoms? °Symptoms of this condition depend on the cause. Symptoms may include: °Swelling under and in front of the ear. This may get worse after eating. °Pain and tenderness over the parotid gland. This may get worse after eating. °Redness and warmth of the skin over the parotid gland. °Fever or chills. °Pus coming from the ducts inside the mouth. °Dry mouth. °A bad taste in the mouth. °How is this diagnosed? °This condition may be diagnosed based on: °Your medical history. °A physical exam. °Tests to find the cause of the parotitis. These may include: °Doing blood tests to check for an autoimmune disease or infections from a virus. °Taking a  fluid sample from the parotid gland and testing it for infection. °Injecting the ducts of the parotid gland with a dye and then taking X-rays (sialogram). °Having other imaging tests of the gland, such as X-rays, ultrasound, MRI, or CT scan. °Checking the opening of the gland for a stone or obstruction. °Placing a needle into the gland to remove tissue for a biopsy (fine needle aspiration). °How is this treated? °Treatment for this condition depends on the cause. Treatment may include: °Antibiotic medicine for a bacterial infection. °NSAIDs, such as ibuprofen, to treat pain and swelling. °Drinking more fluids. °Removing a stone or obstruction. °Treating an underlying disease that is causing parotitis. °Surgery to drain an infection, remove a growth, or remove the whole gland (parotidectomy). °Treatment may not be needed if parotid swelling goes away with home care. °Follow these instructions at home: °Medicines ° °Take over-the-counter and prescription medicines only as told by your health care provider. °If you were prescribed an antibiotic medicine, take it as told by your health care provider. Do not stop taking the antibiotic even if you start to feel better. °Managing pain and swelling °If directed, apply heat to the affected area as often as told by your health care provider. Use the heat source that your health care provider recommends, such as a moist heat pack or a heating pad. °Place a towel between your skin and the heat source. °Leave the heat on for 20-30 minutes. °Remove the heat if your skin turns bright red. This is especially important if you are unable to feel pain, heat, or cold. You   have a greater risk of getting burned. °Gargle with a mixture of salt and water 3-4 times a day or as needed. To make salt water, completely dissolve ½-1 tsp (3-6 g) of salt in 1 cup (237 mL) of warm water. °Gently massage the parotid glands as told by your health care provider. °General instructions ° °Drink enough  fluid to keep your urine pale yellow. °Keep your mouth clean and moist. °Try sucking on sour candy. This may help to make your mouth less dry by stimulating the flow of saliva. °Maintain good oral health. °Brush your teeth at least two times a day. °Floss your teeth every day. °See your dentist regularly. °Do not use any products that contain nicotine or tobacco. These products include cigarettes, chewing tobacco, and vaping devices, such as e-cigarettes. If you need help quitting, ask your health care provider. °Do not drink alcohol. °Keep all follow-up visits. This is important. °Contact a health care provider if: °You have a fever or chills. °You have new symptoms. °Your symptoms get worse. °Your symptoms do not improve with treatment. °Get help right away if: °You have difficulty breathing or swallowing because of the swollen gland. °These symptoms may represent a serious problem that is an emergency. Do not wait to see if the symptoms will go away. Get medical help right away. Call your local emergency services (911 in the [place] Do not drive yourself to the hospital. °Summary °Parotitis is inflammation of one or both of the parotid glands. °Symptoms include pain and swelling under and in front of the ear. They may also include a fever and a bad taste in your mouth. °This condition may be treated with antibiotics, NSAIDs, increasing fluids, or surgery. °In some cases, parotitis may go away on its own without treatment. °You should drink plenty of fluids, maintain good oral health, and do not use products that contain nicotine or tobacco. °This information is not intended to replace advice given to you by your health care provider. Make sure you discuss any questions you have with your health care provider. °Document Revised: 12/29/2020 Document Reviewed: 12/29/2020 °Elsevier Patient Education © 2022 [employer] ° °

## 2021-11-20 ENCOUNTER — Other Ambulatory Visit: Payer: Self-pay

## 2021-11-20 DIAGNOSIS — D219 Benign neoplasm of connective and other soft tissue, unspecified: Secondary | ICD-10-CM | POA: Insufficient documentation

## 2021-11-20 HISTORY — DX: Benign neoplasm of connective and other soft tissue, unspecified: D21.9

## 2021-11-21 ENCOUNTER — Encounter: Payer: Self-pay | Admitting: Family Medicine

## 2021-11-21 ENCOUNTER — Ambulatory Visit (INDEPENDENT_AMBULATORY_CARE_PROVIDER_SITE_OTHER): Payer: Commercial Managed Care - PPO | Admitting: Family Medicine

## 2021-11-21 VITALS — BP 123/81 | HR 62 | Temp 98.5°F | Wt 263.4 lb

## 2021-11-21 DIAGNOSIS — R220 Localized swelling, mass and lump, head: Secondary | ICD-10-CM | POA: Diagnosis not present

## 2021-11-21 DIAGNOSIS — K1120 Sialoadenitis, unspecified: Secondary | ICD-10-CM | POA: Diagnosis not present

## 2021-11-21 NOTE — Progress Notes (Signed)
? ? ? ?This visit occurred during the SARS-CoV-2 public health emergency.  Safety protocols were in place, including screening questions prior to the visit, additional usage of staff PPE, and extensive cleaning of exam room while observing appropriate contact time as indicated for disinfecting solutions.  ? ?[name] , 1971-01-08, 51 y.o., female ?MRN: [id number] ?Patient Care Team  ?  Relationship Specialty Notifications Start End  ?Ma Hillock, DO PCP - General Family Medicine  05/01/15   ?Dan Humphreys  Nurse Practitioner  12/26/15   ?Molli Posey, MD Consulting Physician Obstetrics and Gynecology  09/13/16   ?[place] Crane, MD Consulting Physician Psychiatry  12/21/19   ?Yetta Flock, MD Consulting Physician Gastroenterology  08/22/21   ? ? ?Chief Complaint  ?Patient presents with  ? Facial Swelling  ? ?  ?Subjective: [name] is a 51 y.o. parotiditis follow-up.  She has approximately 2 days left of antibiotics.  She reports great improvement with antibiotics and treatment plan.  She denies any fevers.  Swelling has reduced in her face and resolved in her ear.  She denies any pain. ? ?Prior note: ?Pt presents for an OV with complaints of left-sided facial swelling and discomfort of 2-3 days.  She reports her ear started swelling yesterday.  She denies any pain on the inside of her ear or mouth.  She states she has a very mild sore throat.  She denies fevers or chills.  She has never had anything like this in the past. ? ? ?  11/21/2021  ?  2:47 PM 08/22/2021  ?  1:48 PM 06/21/2020  ?  8:10 AM 03/19/2019  ?  8:41 AM 09/23/2018  ?  8:11 AM  ?Depression screen PHQ 2/9  ?Decreased Interest 1 1 0 1 1  ?Down, Depressed, Hopeless 1 2 0 1 1  ?PHQ - 2 Score 2 3 0 2 2  ?Altered sleeping  1  0 1  ?Tired, decreased energy  '1  1 1  '$ ?Change in appetite  2  2 0  ?Feeling bad or failure about yourself   '1  2 1  '$ ?Trouble concentrating  0  0 0  ?Moving slowly or fidgety/restless  0  1 0   ?Suicidal thoughts  1  0 0  ?PHQ-9 Score  '9  8 5  '$ ?Difficult doing work/chores    Somewhat difficult Somewhat difficult  ? ?Allergies  ?Allergen Reactions  ? Ace Inhibitors Swelling and Other (See Comments)  ?  Angioedema   ? ?Social History  ? ?Social History Narrative  ? Lives with husband  ? Caffeine- 2-12 oz cans daily  ? ?Past Medical History:  ?Diagnosis Date  ? Anxiety   ? takes Ativan daily as needed  ? Arthritis   ? Cancer [facility])   ? skin pre-cancer mole  ? Depression   ? psychiatry  ? GERD (gastroesophageal reflux disease)   ? Headache(784.0)   ? Hyperlipidemia   ? takes Atorvastatin daily  ? Hypertension   ? takes Amlodipine and Propranolol daily  ? Insomnia   ? takes Trazodone at bedtime  ? PONV (postoperative nausea and vomiting)   ? SAH (subarachnoid hemorrhage) ([place]) 2014  ? 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  ? Sleep apnea   ? MILD  ? ?Past Surgical History:  ?Procedure Laterality Date  ? BRAIN SURGERY    ? aneurysm coiling  ? DILATION AND CURETTAGE OF UTERUS  2009  ? IR RADIOLOGIST EVAL & MGMT  12/05/2020  ? LAPAROTOMY    ? MYOMECTOMY  05/16/2011  ? Procedure: MYOMECTOMY;  Surgeon: Margarette Asal;  Location: [place] [facility];  Service: Gynecology;  Laterality: N/A;  abdominal  ? RADIOLOGY WITH ANESTHESIA N/A 02/18/2013  ? Procedure: RADIOLOGY WITH ANESTHESIA;  Surgeon: Rob Hickman, MD;  Location: [place];  Service: Radiology;  Laterality: N/A;  ? RADIOLOGY WITH ANESTHESIA N/A 01/26/2014  ? Procedure: RADIOLOGY WITH ANESTHESIA;  Surgeon: Rob Hickman, MD;  Location: [place];  Service: Radiology;  Laterality: N/A;  ? RADIOLOGY WITH ANESTHESIA N/A 07/25/2014  ? Procedure: RADIOLOGY WITH ANESTHESIA;  Surgeon: Rob Hickman, MD;  Location: [place];  Service: Radiology;  Laterality: N/A;  ? ?Family History  ?Problem Relation Age of Onset  ? Heart disease Mother 51  ?     cad  ? Lupus Mother   ? COPD Mother   ? Arthritis Mother   ?     rheumatoid  ? AVM Mother   ? Diabetes Mother   ? Bipolar  disorder Mother   ? Drug abuse Mother   ? Fibromyalgia Mother   ? Colon polyps Mother   ? Diabetes Father   ? Cancer Father   ?     thyroid, lung  ? Depression Sister   ? Diabetes Sister   ? Hyperlipidemia Sister   ? Hypertension Sister   ? Cancer Paternal Grandfather   ?     colon  ? Colon cancer Paternal Grandfather   ? Alcohol abuse Paternal Uncle   ? Drug abuse Maternal Grandfather   ? Alcohol abuse Maternal Grandfather   ? Esophageal cancer Neg Hx   ? Stomach cancer Neg Hx   ? Rectal cancer Neg Hx   ? ?Allergies as of 11/21/2021   ? ?   Reactions  ? Ace Inhibitors Swelling, Other (See Comments)  ? Angioedema   ? ?  ? ?  ?Medication List  ?  ? ?  ? Accurate as of November 21, 2021  3:00 PM. If you have any questions, ask your nurse or doctor.  ?  ?  ? ?  ? ?STOP taking these medications   ? ?esomeprazole 20 MG packet ?Commonly known as: [place] ?Stopped by: Howard Pouch, DO ?  ? ?  ? ?TAKE these medications   ? ?amLODipine 10 MG tablet ?Commonly known as: NORVASC ?Take 1 tablet (10 mg total) by mouth daily. ?  ?aspirin EC 325 MG tablet ?Take 325 mg by mouth daily. ?  ?atorvastatin 80 MG tablet ?Commonly known as: LIPITOR ?Take 1 tablet (80 mg total) by mouth daily. ?  ?baclofen 10 MG tablet ?Commonly known as: LIORESAL ?Take 10 mg by mouth 3 (three) times daily as needed. ?  ?clonazePAM 0.5 MG tablet ?Commonly known as: KLONOPIN ?Take 0.5-1 mg by mouth 2 (two) times daily. 0.'5mg'$  in the mornnig, '1mg'$  in the evening ?  ?DULoxetine 30 MG capsule ?Commonly known as: CYMBALTA ?Take 90 mg by mouth. ?  ?Eszopiclone 3 MG Tabs ?Take 3 mg by mouth at bedtime. ?  ?famotidine-calcium carbonate-magnesium hydroxide 10-800-165 MG chewable tablet ?Commonly known as: PEPCID COMPLETE ?Chew 1 tablet by mouth daily as needed. ?  ?fenofibrate 145 MG tablet ?Commonly known as: TRICOR ?Take 1 tablet (145 mg total) by mouth daily. ?  ?fexofenadine 60 MG tablet ?Commonly known as: ALLEGRA ?Take 60 mg by mouth daily. ?  ?FISH OIL PO ?Take 1  capsule by mouth daily. ?  ?Fluocinolone Acetonide Scalp 0.01 %  Oil ?Apply topically once a week. ?  ?ketoconazole 2 % shampoo ?Commonly known as: NIZORAL ?Apply topically once a week. ?  ?levofloxacin 750 MG tablet ?Commonly known as: Levaquin ?Take 1 tablet (750 mg total) by mouth daily. ?  ?metroNIDAZOLE 500 MG tablet ?Commonly known as: FLAGYL ?Take 1 tablet (500 mg total) by mouth 3 (three) times daily for 10 days. ?  ?Mirena (52 MG) 20 MCG/DAY Iud ?Generic drug: levonorgestrel ?Mirena 20 mcg/24 hours (6 yrs) 52 mg intrauterine device ? Take 1 device by intrauterine route. ?  ?multivitamin with minerals Tabs tablet ?Take 1 tablet by mouth daily. ?  ?Nurtec 75 MG Tbdp ?Generic drug: Rimegepant Sulfate ?SMARTSIG:1 Tablet(s) Sublingual Every Other Day ?  ?predniSONE 20 MG tablet ?Commonly known as: DELTASONE ?Take 2 tablets (40 mg total) by mouth daily with breakfast. ?  ?prochlorperazine 10 MG tablet ?Commonly known as: COMPAZINE ?Take 1 tablet by mouth 3 (three) times daily as needed. ?  ?propranolol ER 120 MG 24 hr capsule ?Commonly known as: INDERAL LA ?Take 1 capsule (120 mg total) by mouth daily. ?  ?QUEtiapine Fumarate 150 MG 24 hr tablet ?Commonly known as: SEROQUEL XR ?Take 150 mg by mouth at bedtime. ?  ?triamcinolone cream 0.1 % ?Commonly known as: KENALOG ?Apply topically 2 (two) times daily. ?  ? ?  ? ? ?All past medical history, surgical history, allergies, family history, immunizations andmedications were updated in the EMR today and reviewed under the history and medication portions of their EMR.    ? ?ROS ?Negative, with the exception of above mentioned in HPI ? ?Objective:  ?BP 123/81   Pulse 62   Temp 98.5 ?F (36.9 ?C)   Wt 263 lb 6.4 oz (119.5 kg)   SpO2 96%   BMI 43.50 kg/m?  ?Body mass index is 43.5 kg/m?Marland Kitchen ?Physical Exam ?Vitals and nursing note reviewed.  ?Constitutional:   ?   General: She is not in acute distress. ?   Appearance: Normal appearance. She is normal weight. She is not  ill-appearing or toxic-appearing.  ?HENT:  ?   Head:  ?   Comments: Left-sided facial swelling 90% resolved. ?   Right Ear: Tympanic membrane, ear canal and external ear normal. There is no impacted cerumen.  ?   Left

## 2021-11-21 NOTE — Patient Instructions (Signed)
Looks much better today!!! ? ? ? ?

## 2021-11-26 ENCOUNTER — Other Ambulatory Visit: Payer: Self-pay | Admitting: Family Medicine

## 2021-11-26 DIAGNOSIS — I10 Essential (primary) hypertension: Secondary | ICD-10-CM

## 2021-11-26 DIAGNOSIS — E785 Hyperlipidemia, unspecified: Secondary | ICD-10-CM

## 2021-12-12 ENCOUNTER — Encounter: Payer: Self-pay | Admitting: Family Medicine

## 2021-12-12 ENCOUNTER — Ambulatory Visit (INDEPENDENT_AMBULATORY_CARE_PROVIDER_SITE_OTHER): Payer: Commercial Managed Care - PPO | Admitting: Family Medicine

## 2021-12-12 VITALS — BP 96/61 | HR 68 | Temp 98.1°F | Ht 65.25 in | Wt 259.0 lb

## 2021-12-12 DIAGNOSIS — T148XXA Other injury of unspecified body region, initial encounter: Secondary | ICD-10-CM | POA: Diagnosis not present

## 2021-12-12 DIAGNOSIS — M542 Cervicalgia: Secondary | ICD-10-CM | POA: Diagnosis not present

## 2021-12-12 MED ORDER — CYCLOBENZAPRINE HCL 10 MG PO TABS: 5.0000 mg | ORAL_TABLET | Freq: Two times a day (BID) | ORAL | 1 refills | Status: DC | PRN

## 2021-12-12 NOTE — Patient Instructions (Signed)
? ? ?  Heat, massage, stretches, naproxen and flexeril nightly (can use 1/2 tab in the day if needed and it does not make you too sleepy). ? ? ? ?

## 2021-12-12 NOTE — Progress Notes (Signed)
? ? ? ?This visit occurred during the SARS-CoV-2 public health emergency.  Safety protocols were in place, including screening questions prior to the visit, additional usage of staff PPE, and extensive cleaning of exam room while observing appropriate contact time as indicated for disinfecting solutions.  ? ? ?[name] , November 13, 1970, 51 y.o., female ?MRN: [id number] ?Patient Care Team  ?  Relationship Specialty Notifications Start End  ?Ma Hillock, DO PCP - General Family Medicine  05/01/15   ?Dan Humphreys  Nurse Practitioner  12/26/15   ?Molli Posey, MD Consulting Physician Obstetrics and Gynecology  09/13/16   ?Nevada Crane, MD Consulting Physician Psychiatry  12/21/19   ?Yetta Flock, MD Consulting Physician Gastroenterology  08/22/21   ? ? ?Chief Complaint  ?Patient presents with  ? Neck Pain  ?  Pt c/o neck pain that radiates to shoulder after trying to turn can opener manual 2 days ago; Pt has tried heat and naproxen which gave some short term relief   ? ?  ?Subjective: Pt presents for an OV with complaints of neck pain of 2 days duration after struggling with a manual can opener. She denies numbness or tingling distally. She has been using heat application and naproxen that only provides temporary relief.  ? ? ?  11/21/2021  ?  2:47 PM 08/22/2021  ?  1:48 PM 06/21/2020  ?  8:10 AM 03/19/2019  ?  8:41 AM 09/23/2018  ?  8:11 AM  ?Depression screen PHQ 2/9  ?Decreased Interest 1 1 0 1 1  ?Down, Depressed, Hopeless 1 2 0 1 1  ?PHQ - 2 Score 2 3 0 2 2  ?Altered sleeping  1  0 1  ?Tired, decreased energy  '1  1 1  '$ ?Change in appetite  2  2 0  ?Feeling bad or failure about yourself   '1  2 1  '$ ?Trouble concentrating  0  0 0  ?Moving slowly or fidgety/restless  0  1 0  ?Suicidal thoughts  1  0 0  ?PHQ-9 Score  '9  8 5  '$ ?Difficult doing work/chores    Somewhat difficult Somewhat difficult  ? ? ?Allergies  ?Allergen Reactions  ? Ace Inhibitors Swelling and Other (See Comments)  ?  Angioedema    ? ?Social History  ? ?Social History Narrative  ? Lives with husband  ? Caffeine- 2-12 oz cans daily  ? ?Past Medical History:  ?Diagnosis Date  ? Anxiety   ? takes Ativan daily as needed  ? Arthritis   ? Cancer [facility])   ? skin pre-cancer mole  ? Depression   ? psychiatry  ? GERD (gastroesophageal reflux disease)   ? Headache(784.0)   ? Hyperlipidemia   ? takes Atorvastatin daily  ? Hypertension   ? takes Amlodipine and Propranolol daily  ? Insomnia   ? takes Trazodone at bedtime  ? PONV (postoperative nausea and vomiting)   ? SAH (subarachnoid hemorrhage) ([place]) 2014  ? 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  ? Sleep apnea   ? MILD  ? ?Past Surgical History:  ?Procedure Laterality Date  ? BRAIN SURGERY    ? aneurysm coiling  ? DILATION AND CURETTAGE OF UTERUS  2009  ? IR RADIOLOGIST EVAL & MGMT  12/05/2020  ? LAPAROTOMY    ? MYOMECTOMY  05/16/2011  ? Procedure: MYOMECTOMY;  Surgeon: Margarette Asal;  Location: [facility];  Service: Gynecology;  Laterality: N/A;  abdominal  ? RADIOLOGY WITH ANESTHESIA N/A 02/18/2013  ?  Procedure: RADIOLOGY WITH ANESTHESIA;  Surgeon: Rob Hickman, MD;  Location: [place];  Service: Radiology;  Laterality: N/A;  ? RADIOLOGY WITH ANESTHESIA N/A 01/26/2014  ? Procedure: RADIOLOGY WITH ANESTHESIA;  Surgeon: Rob Hickman, MD;  Location: [place];  Service: Radiology;  Laterality: N/A;  ? RADIOLOGY WITH ANESTHESIA N/A 07/25/2014  ? Procedure: RADIOLOGY WITH ANESTHESIA;  Surgeon: Rob Hickman, MD;  Location: [place];  Service: Radiology;  Laterality: N/A;  ? ?Family History  ?Problem Relation Age of Onset  ? Heart disease Mother 16  ?     cad  ? Lupus Mother   ? COPD Mother   ? Arthritis Mother   ?     rheumatoid  ? AVM Mother   ? Diabetes Mother   ? Bipolar disorder Mother   ? Drug abuse Mother   ? Fibromyalgia Mother   ? Colon polyps Mother   ? Diabetes Father   ? Cancer Father   ?     thyroid, lung  ? Depression Sister   ? Diabetes Sister   ? Hyperlipidemia Sister   ?  Hypertension Sister   ? Cancer Paternal Grandfather   ?     colon  ? Colon cancer Paternal Grandfather   ? Alcohol abuse Paternal Uncle   ? Drug abuse Maternal Grandfather   ? Alcohol abuse Maternal Grandfather   ? Esophageal cancer Neg Hx   ? Stomach cancer Neg Hx   ? Rectal cancer Neg Hx   ? ?Allergies as of 12/12/2021   ? ?   Reactions  ? Ace Inhibitors Swelling, Other (See Comments)  ? Angioedema   ? ?  ? ?  ?Medication List  ?  ? ?  ? Accurate as of December 12, 2021  2:45 PM. If you have any questions, ask your nurse or doctor.  ?  ?  ? ?  ? ?amLODipine 10 MG tablet ?Commonly known as: NORVASC ?Take 1 tablet (10 mg total) by mouth daily. ?  ?aspirin EC 325 MG tablet ?Take 325 mg by mouth daily. ?  ?atorvastatin 80 MG tablet ?Commonly known as: LIPITOR ?Take 1 tablet (80 mg total) by mouth daily. ?  ?baclofen 10 MG tablet ?Commonly known as: LIORESAL ?Take 10 mg by mouth 3 (three) times daily as needed. ?  ?clonazePAM 0.5 MG tablet ?Commonly known as: KLONOPIN ?Take 0.5-1 mg by mouth 2 (two) times daily. 0.'5mg'$  in the mornnig, '1mg'$  in the evening ?  ?DULoxetine 60 MG capsule ?Commonly known as: CYMBALTA ?Take 120 mg by mouth daily. ?What changed: Another medication with the same name was removed. Continue taking this medication, and follow the directions you see here. ?Changed by: Howard Pouch, DO ?  ?Eszopiclone 3 MG Tabs ?Take 3 mg by mouth at bedtime. ?  ?famotidine-calcium carbonate-magnesium hydroxide 10-800-165 MG chewable tablet ?Commonly known as: PEPCID COMPLETE ?Chew 1 tablet by mouth daily as needed. ?  ?fenofibrate 145 MG tablet ?Commonly known as: TRICOR ?Take 1 tablet (145 mg total) by mouth daily. ?  ?fexofenadine 60 MG tablet ?Commonly known as: ALLEGRA ?Take 60 mg by mouth daily. ?  ?FISH OIL PO ?Take 1 capsule by mouth daily. ?  ?Fluocinolone Acetonide Scalp 0.01 % Oil ?Apply topically once a week. ?  ?ketoconazole 2 % shampoo ?Commonly known as: NIZORAL ?Apply topically once a week. ?   ?levofloxacin 750 MG tablet ?Commonly known as: Levaquin ?Take 1 tablet (750 mg total) by mouth daily. ?  ?Mirena (52 MG) 20  MCG/DAY Iud ?Generic drug: levonorgestrel ?Mirena 20 mcg/24 hours (6 yrs) 52 mg intrauterine device ? Take 1 device by intrauterine route. ?  ?multivitamin with minerals Tabs tablet ?Take 1 tablet by mouth daily. ?  ?Nurtec 75 MG Tbdp ?Generic drug: Rimegepant Sulfate ?SMARTSIG:1 Tablet(s) Sublingual Every Other Day ?  ?predniSONE 20 MG tablet ?Commonly known as: DELTASONE ?Take 2 tablets (40 mg total) by mouth daily with breakfast. ?  ?prochlorperazine 10 MG tablet ?Commonly known as: COMPAZINE ?Take 1 tablet by mouth 3 (three) times daily as needed. ?  ?propranolol ER 120 MG 24 hr capsule ?Commonly known as: INDERAL LA ?Take 1 capsule (120 mg total) by mouth daily. ?  ?QUEtiapine Fumarate 150 MG 24 hr tablet ?Commonly known as: SEROQUEL XR ?Take 150 mg by mouth at bedtime. ?  ?triamcinolone cream 0.1 % ?Commonly known as: KENALOG ?Apply topically 2 (two) times daily. ?  ? ?  ? ? ?All past medical history, surgical history, allergies, family history, immunizations andmedications were updated in the EMR today and reviewed under the history and medication portions of their EMR.    ? ?ROS ?Negative, with the exception of above mentioned in HPI ? ? ?Objective:  ?BP 96/61   Pulse 68   Temp 98.1 ?F (36.7 ?C) (Oral)   Ht 5' 5.25" (1.657 m)   Wt 259 lb (117.5 kg)   SpO2 96%   BMI 42.77 kg/m?  ?Body mass index is 42.77 kg/m?Marland Kitchen ?Physical Exam ?Vitals and nursing note reviewed.  ?Constitutional:   ?   General: She is not in acute distress. ?   Appearance: Normal appearance. She is normal weight. She is not ill-appearing or toxic-appearing.  ?Eyes:  ?   Extraocular Movements: Extraocular movements intact.  ?   Conjunctiva/sclera: Conjunctivae normal.  ?   Pupils: Pupils are equal, round, and reactive to light.  ?Musculoskeletal:     ?   General: Tenderness present. No swelling or deformity.  ?    Comments: Neck: ropiness of right SCM and scalenes. DROM right rotation and left side bending.   ?Neurological:  ?   Mental Status: She is alert and oriented to person, place, and time. Mental status is at baseline.  ?Psychiatric:

## 2021-12-27 ENCOUNTER — Telehealth (HOSPITAL_COMMUNITY): Payer: Self-pay

## 2021-12-27 ENCOUNTER — Other Ambulatory Visit (HOSPITAL_COMMUNITY): Payer: Self-pay | Admitting: Interventional Radiology

## 2021-12-27 DIAGNOSIS — I671 Cerebral aneurysm, nonruptured: Secondary | ICD-10-CM

## 2021-12-27 NOTE — Telephone Encounter (Signed)
Called to schedule mra, no answer, left vm. AW  ?

## 2022-01-16 ENCOUNTER — Ambulatory Visit (HOSPITAL_COMMUNITY): Payer: Commercial Managed Care - PPO

## 2022-01-22 ENCOUNTER — Ambulatory Visit (HOSPITAL_COMMUNITY)
Admission: RE | Admit: 2022-01-22 | Discharge: 2022-01-22 | Disposition: A | Discharge status: Home / Self Care | Payer: Commercial Managed Care - PPO | Source: Ambulatory Visit | Attending: Interventional Radiology | Admitting: Interventional Radiology

## 2022-01-22 DIAGNOSIS — I671 Cerebral aneurysm, nonruptured: Secondary | ICD-10-CM | POA: Diagnosis present

## 2022-01-30 ENCOUNTER — Telehealth (HOSPITAL_COMMUNITY): Payer: Self-pay

## 2022-01-30 LAB — HM MAMMOGRAPHY

## 2022-01-30 LAB — RESULTS CONSOLE HPV: CHL HPV: NEGATIVE

## 2022-01-30 LAB — HM PAP SMEAR

## 2022-01-30 NOTE — Telephone Encounter (Signed)
Pt agreed to f/u in 1 year with an mra. AW  

## 2022-01-31 LAB — RESULTS CONSOLE HPV: CHL HPV: NEGATIVE

## 2022-01-31 LAB — HM PAP SMEAR

## 2022-02-01 ENCOUNTER — Other Ambulatory Visit: Payer: Self-pay | Admitting: Family Medicine

## 2022-02-07 ENCOUNTER — Ambulatory Visit: Payer: Commercial Managed Care - PPO | Admitting: Family Medicine

## 2022-02-12 ENCOUNTER — Other Ambulatory Visit: Payer: Self-pay | Admitting: Family Medicine

## 2022-02-12 ENCOUNTER — Ambulatory Visit (INDEPENDENT_AMBULATORY_CARE_PROVIDER_SITE_OTHER): Payer: Commercial Managed Care - PPO | Admitting: Family Medicine

## 2022-02-12 ENCOUNTER — Encounter: Payer: Self-pay | Admitting: Family Medicine

## 2022-02-12 VITALS — BP 122/80 | HR 82 | Temp 98.3°F | Ht 65.25 in | Wt 262.0 lb

## 2022-02-12 DIAGNOSIS — E538 Deficiency of other specified B group vitamins: Secondary | ICD-10-CM

## 2022-02-12 DIAGNOSIS — I10 Essential (primary) hypertension: Secondary | ICD-10-CM | POA: Diagnosis not present

## 2022-02-12 DIAGNOSIS — E785 Hyperlipidemia, unspecified: Secondary | ICD-10-CM | POA: Diagnosis not present

## 2022-02-12 DIAGNOSIS — N1831 Chronic kidney disease, stage 3a: Secondary | ICD-10-CM | POA: Diagnosis not present

## 2022-02-12 DIAGNOSIS — R7303 Prediabetes: Secondary | ICD-10-CM

## 2022-02-12 HISTORY — DX: Prediabetes: R73.03

## 2022-02-12 LAB — COMPREHENSIVE METABOLIC PANEL
ALT: 41 U/L — ABNORMAL HIGH (ref 0–35)
AST: 28 U/L (ref 0–37)
Albumin: 4.5 g/dL (ref 3.5–5.2)
Alkaline Phosphatase: 56 U/L (ref 39–117)
BUN: 8 mg/dL (ref 6–23)
CO2: 28 mEq/L (ref 19–32)
Calcium: 10.1 mg/dL (ref 8.4–10.5)
Chloride: 100 mEq/L (ref 96–112)
Creatinine, Ser: 0.95 mg/dL (ref 0.40–1.20)
GFR: 70.85 mL/min (ref >60.00)
Glucose, Bld: 116 mg/dL — ABNORMAL HIGH (ref 70–99)
Potassium: 4 mEq/L (ref 3.5–5.1)
Sodium: 136 mEq/L (ref 135–145)
Total Bilirubin: 0.7 mg/dL (ref 0.2–1.2)
Total Protein: 7.1 g/dL (ref 6.0–8.3)

## 2022-02-12 LAB — HEMOGLOBIN A1C: Hgb A1c MFr Bld: 5.9 % (ref 4.6–6.5)

## 2022-02-12 LAB — VITAMIN B12: Vitamin B-12: 973 pg/mL — ABNORMAL HIGH (ref 211–911)

## 2022-02-12 MED ORDER — PROPRANOLOL HCL ER 120 MG PO CP24: 120.0000 mg | ORAL_CAPSULE | Freq: Every day | ORAL | 1 refills | Status: DC

## 2022-02-12 MED ORDER — AMLODIPINE BESYLATE 10 MG PO TABS: 10.0000 mg | ORAL_TABLET | Freq: Every day | ORAL | 1 refills | Status: DC

## 2022-02-12 MED ORDER — ATORVASTATIN CALCIUM 80 MG PO TABS: 80.0000 mg | ORAL_TABLET | Freq: Every day | ORAL | 3 refills | Status: DC

## 2022-02-12 MED ORDER — FENOFIBRATE 145 MG PO TABS: 145.0000 mg | ORAL_TABLET | Freq: Every day | ORAL | 1 refills | Status: DC

## 2022-02-12 NOTE — Patient Instructions (Addendum)
Return in about 6 months (around 08/28/2022) for cpe (20 min).        Great to see you today.  I have refilled the medication(s) we provide.   If labs were collected, we will inform you of lab results once received either by echart message or telephone call.   - echart message- for normal results that have been seen by the patient already.   - telephone call: abnormal results or if patient has not viewed results in their echart.

## 2022-02-12 NOTE — Progress Notes (Signed)
Patient ID: [name], female  DOB: June 05, 1971, 51 y.o.   MRN: [id number] Patient Care Team    Relationship Specialty Notifications Start End  Ma Hillock, DO PCP - General Family Medicine  05/01/15   Dan Humphreys  Nurse Practitioner  12/26/15   Molli Posey, MD Consulting Physician Obstetrics and Gynecology  09/13/16   Nevada Crane, MD Consulting Physician Psychiatry  12/21/19   Armbruster, Carlota Raspberry, MD Consulting Physician Gastroenterology  08/22/21     Chief Complaint  Patient presents with   Hypertension    Cmc; pt is not fasting    Subjective: [name] is a 51 y.o.  Female  present for [facility] All past medical history, surgical history, allergies, family history, immunizations, medications and social history were updated in the electronic medical record today. All recent labs, ED visits and hospitalizations within the last year were reviewed.   Hypertension/Hyperlipidemia/LE edema:   She reports compliance with her Inderal LA 120, Lipitor, tricor and amlodipine 10. She has started the lasix 20 mg QD PRN-not taking often, with only mild improvement in LE edema. Patient denies chest pain, shortness of breath, dizziness or lower extremity edema.  Pt is  prescribed statin and tricor,  taking fish oil 3600 mg daily.  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  Diet: low salt  Exercise: exercising routinely RF: HLD, stable coiled aneurysm, HTN, Fhx       02/12/2022    8:17 AM 11/21/2021    2:47 PM 08/22/2021    1:48 PM 06/21/2020    8:10 AM 03/19/2019    8:41 AM  Depression screen PHQ 2/9  Decreased Interest _0 0 1  Down, Depressed, Hopeless _1 0 1  PHQ - 2 Score _2 0 2  Altered sleeping 0  1  0  Tired, decreased energy _3 Change in appetite _4 Feeling bad or failure about yourself  _5 Trouble concentrating 0  0  0  Moving slowly or fidgety/restless 0  0  1   Suicidal thoughts 0  1  0  PHQ-9 Score _6 Difficult doing work/chores     Somewhat difficult      02/12/2022    8:20 AM 08/22/2021    1:48 PM 03/19/2019    8:41 AM 09/23/2018    8:12 AM  GAD 7 : Generalized Anxiety Score  Nervous, Anxious, on Edge _7 Control/stop worrying _8 0  Worry too much - different things _9 Trouble relaxing _10 0  Restless 0 0 1 0  Easily annoyed or irritable 0 0 1 0  Afraid - awful might happen _11 Total GAD 7 Score _12 Anxiety Difficulty   Somewhat difficult Somewhat difficult    Immunization History  Administered Date(s) Administered   Influenza Split 05/18/2011   Influenza,inj,Quad PF,6+ Mos 07/09/2013, 05/04/2014, 05/11/2015, 05/29/2018   Influenza-Unspecified 06/20/2017, 05/14/2019, 05/12/2020, 05/11/2021   PFIZER(Purple Top)SARS-COV-2 Vaccination 11/29/2019, 12/24/2019, 08/04/2020   Td 09/02/2005   Tdap 09/13/2016     Past Medical History:  Diagnosis Date   Anxiety    takes Ativan daily as needed   Arthritis    Cancer ([place])    skin  pre-cancer mole   Depression    psychiatry   GERD (gastroesophageal reflux disease)    Headache(784.0)    Hyperlipidemia    takes Atorvastatin daily   Hypertension    takes Amlodipine and Propranolol daily   Insomnia    takes Trazodone at bedtime   PONV (postoperative nausea and vomiting)    SAH (subarachnoid hemorrhage) ([place]) 2014   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   Sleep apnea    MILD   Allergies  Allergen Reactions   Ace Inhibitors Swelling and Other (See Comments)    Angioedema    Past Surgical History:  Procedure Laterality Date   BRAIN SURGERY     aneurysm coiling   DILATION AND CURETTAGE OF UTERUS  2009   IR RADIOLOGIST EVAL & MGMT  12/05/2020   LAPAROTOMY     MYOMECTOMY  05/16/2011   Procedure: MYOMECTOMY;  Surgeon: Margarette Asal;  Location: [facility];  Service: Gynecology;  Laterality: N/A;  abdominal   RADIOLOGY WITH ANESTHESIA N/A  02/18/2013   Procedure: RADIOLOGY WITH ANESTHESIA;  Surgeon: Rob Hickman, MD;  Location: [place];  Service: Radiology;  Laterality: N/A;   RADIOLOGY WITH ANESTHESIA N/A 01/26/2014   Procedure: RADIOLOGY WITH ANESTHESIA;  Surgeon: Rob Hickman, MD;  Location: [place];  Service: Radiology;  Laterality: N/A;   RADIOLOGY WITH ANESTHESIA N/A 07/25/2014   Procedure: RADIOLOGY WITH ANESTHESIA;  Surgeon: Rob Hickman, MD;  Location: [place];  Service: Radiology;  Laterality: N/A;   Family History  Problem Relation Age of Onset   Heart disease Mother 83       cad   Lupus Mother    COPD Mother    Arthritis Mother        rheumatoid   AVM Mother    Diabetes Mother    Bipolar disorder Mother    Drug abuse Mother    Fibromyalgia Mother    Colon polyps Mother    Diabetes Father    Cancer Father        thyroid, lung   Depression Sister    Diabetes Sister    Hyperlipidemia Sister    Hypertension Sister    Cancer Paternal Grandfather        colon   Colon cancer Paternal Grandfather    Alcohol abuse Paternal Uncle    Drug abuse Maternal Grandfather    Alcohol abuse Maternal Grandfather    Esophageal cancer Neg Hx    Stomach cancer Neg Hx    Rectal cancer Neg Hx    Social History   Social History Narrative   Lives with husband   Caffeine- 2-12 oz cans daily    Allergies as of 02/12/2022       Reactions   Ace Inhibitors Swelling, Other (See Comments)   Angioedema         Medication List        Accurate as of February 12, 2022  8:34 AM. If you have any questions, ask your nurse or doctor.          STOP taking these medications    baclofen 10 MG tablet Commonly known as: LIORESAL Stopped by: Howard Pouch, DO   cyclobenzaprine 10 MG tablet Commonly known as: FLEXERIL Stopped by: Howard Pouch, DO   famotidine-calcium carbonate-magnesium hydroxide 10-800-165 MG chewable tablet Commonly known as: PEPCID COMPLETE Stopped by: Howard Pouch, DO   levofloxacin  750 MG tablet Commonly known as: Levaquin Stopped by: Howard Pouch, DO  TAKE these medications    amLODipine 10 MG tablet Commonly known as: NORVASC Take 1 tablet (10 mg total) by mouth daily.   aspirin EC 325 MG tablet Take 325 mg by mouth daily.   atorvastatin 80 MG tablet Commonly known as: LIPITOR Take 1 tablet (80 mg total) by mouth daily.   clonazePAM 0.5 MG tablet Commonly known as: KLONOPIN Take 0.5-1 mg by mouth 2 (two) times daily. 0.55m in the mornnig, 145min the evening   DULoxetine 60 MG capsule Commonly known as: CYMBALTA Take 120 mg by mouth daily.   Eszopiclone 3 MG Tabs Take 3 mg by mouth at bedtime.   fenofibrate 145 MG tablet Commonly known as: TRICOR Take 1 tablet (145 mg total) by mouth daily.   fexofenadine 60 MG tablet Commonly known as: ALLEGRA Take 60 mg by mouth daily.   FISH OIL PO Take 1 capsule by mouth daily.   Fluocinolone Acetonide Scalp 0.01 % Oil Apply topically once a week.   ketoconazole 2 % shampoo Commonly known as: NIZORAL Apply topically once a week.   Mirena (52 MG) 20 MCG/DAY Iud Generic drug: levonorgestrel Mirena 20 mcg/24 hours (6 yrs) 52 mg intrauterine device  Take 1 device by intrauterine route.   multivitamin with minerals Tabs tablet Take 1 tablet by mouth daily.   NEXIUM PO Take by mouth.   Nurtec 75 MG Tbdp Generic drug: Rimegepant Sulfate SMARTSIG:1 Tablet(s) Sublingual Every Other Day   prochlorperazine 10 MG tablet Commonly known as: COMPAZINE Take 1 tablet by mouth 3 (three) times daily as needed.   propranolol ER 120 MG 24 hr capsule Commonly known as: INDERAL LA Take 1 capsule (120 mg total) by mouth daily.   QUEtiapine Fumarate 150 MG 24 hr tablet Commonly known as: SEROQUEL XR Take 150 mg by mouth at bedtime.   triamcinolone cream 0.1 % Commonly known as: KENALOG Apply topically 2 (two) times daily.        All past medical history, surgical history, allergies, family  history, immunizations andmedications were updated in the EMR today and reviewed under the history and medication portions of their EMR.     No results found for this or any previous visit (from the past 2160 hour(s)).  No results found.   ROS 14 pt review of systems performed and negative (unless mentioned in an HPI)  Objective: BP 122/80   Pulse 82   Temp 98.3 F (36.8 C) (Oral)   Ht 5' 5.25" (1.657 m)   Wt 262 lb (118.8 kg)   SpO2 98%   BMI 43.27 kg/m  Physical Exam Vitals and nursing note reviewed.  Constitutional:      General: She is not in acute distress.    Appearance: Normal appearance. She is obese. She is not ill-appearing, toxic-appearing or diaphoretic.  HENT:     Head: Normocephalic and atraumatic.  Eyes:     General: No scleral icterus.       Right eye: No discharge.        Left eye: No discharge.     Extraocular Movements: Extraocular movements intact.     Conjunctiva/sclera: Conjunctivae normal.     Pupils: Pupils are equal, round, and reactive to light.  Cardiovascular:     Rate and Rhythm: Normal rate and regular rhythm.     Heart sounds: No murmur heard. Pulmonary:     Effort: Pulmonary effort is normal. No respiratory distress.     Breath sounds: Normal breath sounds. No wheezing, rhonchi or  rales.  Musculoskeletal:     Cervical back: Neck supple. No tenderness.     Right lower leg: No edema.     Left lower leg: No edema.  Lymphadenopathy:     Cervical: No cervical adenopathy.  Skin:    General: Skin is warm and dry.     Coloration: Skin is not jaundiced or pale.     Findings: No erythema or rash.  Neurological:     Mental Status: She is alert and oriented to person, place, and time. Mental status is at baseline.     Motor: No weakness.     Gait: Gait normal.  Psychiatric:        Mood and Affect: Mood normal.        Behavior: Behavior normal.        Thought Content: Thought content normal.        Judgment: Judgment normal.       No  results found.  Assessment/plan: IDALIA ALLBRITTON is a 51 y.o. female present for [facility] Essential hypertension/HLD/morbid obesity Anterior cerebral artery aneurysm/extremity edema Stable continue amlodipine 10  Continue propranolol 120 mg -Low-sodium diet. Diet and exercise modifications discussed today.  -continue  Lipitor 80 mg daily -continue  Lasix 20 mg daily as needed. Rarely needs.  -Use compression stockings.  Weight loss can also be helpful with lower extremity edema - fish oil supplement is 4g daily. - continue fenofibrate - increase fiber in diet - could consider zetia if add on needed - continue follow ups with IR for aneurysm> stable.  Labs UTD- due next visit F/u 5.5 mos   Prediabetes/metabolic syndrome Has been working on diet - POCT HgB A1C 6.0> 5.3 > 5.9> a1c collected today  Depression with anxiety Managed by psychiatry team  Stage 3a chronic kidney disease ([place]) Aki with lithium use, improved some, but not to full fx.  Cmp collected today> monitor q 6 mos with chronic med use  B12 deficiency - Vitamin B12 supplement continue - b12 collected today was ~320 last visit.   Low serum calcium/vit d def - Vitamin D3- continue supplement - well supplemented by lab 08/2021   Return in about 6 months (around 08/28/2022) for cpe (20 min), Routine chronic condition follow-up.  Orders Placed This Encounter  Procedures   Comp Met (CMET)   Hemoglobin A1c   B12   Meds ordered this encounter  Medications   amLODipine (NORVASC) 10 MG tablet    Sig: Take 1 tablet (10 mg total) by mouth daily.    Dispense:  90 tablet    Refill:  1   fenofibrate (TRICOR) 145 MG tablet    Sig: Take 1 tablet (145 mg total) by mouth daily.    Dispense:  90 tablet    Refill:  1   propranolol ER (INDERAL LA) 120 MG 24 hr capsule    Sig: Take 1 capsule (120 mg total) by mouth daily.    Dispense:  90 capsule    Refill:  1   atorvastatin (LIPITOR) 80 MG tablet    Sig: Take 1  tablet (80 mg total) by mouth daily.    Dispense:  90 tablet    Refill:  3   Referral Orders  No referral(s) requested today     Electronically signed by: Howard Pouch, [place]

## 2022-03-15 ENCOUNTER — Ambulatory Visit (INDEPENDENT_AMBULATORY_CARE_PROVIDER_SITE_OTHER): Payer: Commercial Managed Care - PPO | Admitting: Family Medicine

## 2022-03-15 ENCOUNTER — Encounter: Payer: Self-pay | Admitting: Family Medicine

## 2022-03-15 VITALS — BP 118/78 | HR 78 | Temp 98.2°F | Ht 65.25 in | Wt 269.0 lb

## 2022-03-15 DIAGNOSIS — R600 Localized edema: Secondary | ICD-10-CM | POA: Diagnosis not present

## 2022-03-15 MED ORDER — FUROSEMIDE 20 MG PO TABS: 20.0000 mg | ORAL_TABLET | Freq: Every day | ORAL | 3 refills | Status: DC | PRN

## 2022-03-15 NOTE — Progress Notes (Signed)
[name] , [birth year], 50 y.o., female MRN: [id number] Patient Care Team    Relationship Specialty Notifications Start End  Ma Hillock, DO PCP - General Family Medicine  05/01/15   Dan Humphreys  Nurse Practitioner  12/26/15   Molli Posey, MD Consulting Physician Obstetrics and Gynecology  09/13/16   [place] Crane, MD Consulting Physician Psychiatry  12/21/19   Armbruster, Carlota Raspberry, MD Consulting Physician Gastroenterology  08/22/21     Chief Complaint  Patient presents with   Leg Swelling    Pt c/o b/l leg L> x 2 weeks; have not been compliant with Ted hose     Subjective: Pt presents for an OV with complaints of left leg edema.  She reports that this is similar to her edema in the past.  She always seems to have swelling in her left leg over her right.  She has Lasix as needed for this, she states she had ran out because she had used so infrequently in the past.  She does admit her diet has not been as great over the last couple weeks and may have had increased sodium.  She denies any shortness of breath, fever or recent travel weeks a trip.  She states when she gets up in the morning the swelling is not present, and occurs towards the end of the day.  She is not wearing her compression stockings.  She is upset about her weight gain.     02/12/2022    8:17 AM 11/21/2021    2:47 PM 08/22/2021    1:48 PM 06/21/2020    8:10 AM 03/19/2019    8:41 AM  Depression screen PHQ 2/9  Decreased Interest '1 1 1 '$ 0 1  Down, Depressed, Hopeless '1 1 2 '$ 0 1  PHQ - 2 Score '2 2 3 '$ 0 2  Altered sleeping 0  1  0  Tired, decreased energy '1  1  1  '$ Change in appetite '1  2  2  '$ Feeling bad or failure about yourself  '1  1  2  '$ Trouble concentrating 0  0  0  Moving slowly or fidgety/restless 0  0  1  Suicidal thoughts 0  1  0  PHQ-9 Score '5  9  8  '$ Difficult doing work/chores     Somewhat difficult    Allergies  Allergen Reactions   Ace Inhibitors Swelling and Other (See  Comments)    Angioedema    Social History   Social History Narrative   Lives with husband   Caffeine- 2-12 oz cans daily   Past Medical History:  Diagnosis Date   Anxiety    takes Ativan daily as needed   Arthritis    Cancer ([place])    skin pre-cancer mole   Depression    psychiatry   GERD (gastroesophageal reflux disease)    Headache(784.0)    Hyperlipidemia    takes Atorvastatin daily   Hypertension    takes Amlodipine and Propranolol daily   Insomnia    takes Trazodone at bedtime   PONV (postoperative nausea and vomiting)    SAH (subarachnoid hemorrhage) ([place]) 2014   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   Sleep apnea    MILD   Past Surgical History:  Procedure Laterality Date   BRAIN SURGERY     aneurysm coiling   DILATION AND CURETTAGE OF UTERUS  2009   IR RADIOLOGIST EVAL & MGMT  12/05/2020   LAPAROTOMY  MYOMECTOMY  05/16/2011   Procedure: MYOMECTOMY;  Surgeon: Margarette Asal;  Location: [facility];  Service: Gynecology;  Laterality: N/A;  abdominal   RADIOLOGY WITH ANESTHESIA N/A 02/18/2013   Procedure: RADIOLOGY WITH ANESTHESIA;  Surgeon: Rob Hickman, MD;  Location: [facility];  Service: Radiology;  Laterality: N/A;   RADIOLOGY WITH ANESTHESIA N/A 01/26/2014   Procedure: RADIOLOGY WITH ANESTHESIA;  Surgeon: Rob Hickman, MD;  Location: [facility];  Service: Radiology;  Laterality: N/A;   RADIOLOGY WITH ANESTHESIA N/A 07/25/2014   Procedure: RADIOLOGY WITH ANESTHESIA;  Surgeon: Rob Hickman, MD;  Location: [facility];  Service: Radiology;  Laterality: N/A;   Family History  Problem Relation Age of Onset   Heart disease Mother 34       cad   Lupus Mother    COPD Mother    Arthritis Mother        rheumatoid   AVM Mother    Diabetes Mother    Bipolar disorder Mother    Drug abuse Mother    Fibromyalgia Mother    Colon polyps Mother    Diabetes Father    Cancer Father        thyroid, lung   Depression Sister    Diabetes Sister     Hyperlipidemia Sister    Hypertension Sister    Cancer Paternal Grandfather        colon   Colon cancer Paternal Grandfather    Alcohol abuse Paternal Uncle    Drug abuse Maternal Grandfather    Alcohol abuse Maternal Grandfather    Esophageal cancer Neg Hx    Stomach cancer Neg Hx    Rectal cancer Neg Hx    Allergies as of 03/15/2022       Reactions   Ace Inhibitors Swelling, Other (See Comments)   Angioedema         Medication List        Accurate as of March 15, 2022  6:12 PM. If you have any questions, ask your nurse or doctor.          amLODipine 10 MG tablet Commonly known as: NORVASC Take 1 tablet (10 mg total) by mouth daily.   aspirin EC 325 MG tablet Take 325 mg by mouth daily.   atorvastatin 80 MG tablet Commonly known as: LIPITOR Take 1 tablet (80 mg total) by mouth daily.   clonazePAM 0.5 MG tablet Commonly known as: KLONOPIN Take 0.5-1 mg by mouth 2 (two) times daily. 0.'5mg'$  in the mornnig, '1mg'$  in the evening   DULoxetine 60 MG capsule Commonly known as: CYMBALTA Take 120 mg by mouth daily.   Eszopiclone 3 MG Tabs Take 3 mg by mouth at bedtime.   fenofibrate 145 MG tablet Commonly known as: TRICOR Take 1 tablet (145 mg total) by mouth daily.   fexofenadine 60 MG tablet Commonly known as: ALLEGRA Take 60 mg by mouth daily.   FISH OIL PO Take 1 capsule by mouth daily.   Fluocinolone Acetonide Scalp 0.01 % Oil Apply topically once a week.   furosemide 20 MG tablet Commonly known as: LASIX Take 1 tablet (20 mg total) by mouth daily as needed. Started by: Howard Pouch, DO   ketoconazole 2 % shampoo Commonly known as: NIZORAL Apply topically once a week.   Mirena (52 MG) 20 MCG/DAY Iud Generic drug: levonorgestrel Mirena 20 mcg/24 hours (6 yrs) 52 mg intrauterine device  Take 1 device by intrauterine route.   multivitamin with minerals Tabs tablet Take  1 tablet by mouth daily.   NEXIUM PO Take by mouth.   Nurtec 75 MG  Tbdp Generic drug: Rimegepant Sulfate SMARTSIG:1 Tablet(s) Sublingual Every Other Day   prochlorperazine 10 MG tablet Commonly known as: COMPAZINE Take 1 tablet by mouth 3 (three) times daily as needed.   propranolol ER 120 MG 24 hr capsule Commonly known as: INDERAL LA Take 1 capsule (120 mg total) by mouth daily.   QUEtiapine Fumarate 150 MG 24 hr tablet Commonly known as: SEROQUEL XR Take 150 mg by mouth at bedtime.   triamcinolone cream 0.1 % Commonly known as: KENALOG Apply topically 2 (two) times daily.        All past medical history, surgical history, allergies, family history, immunizations andmedications were updated in the EMR today and reviewed under the history and medication portions of their EMR.     ROS Negative, with the exception of above mentioned in HPI   Objective:  BP 118/78   Pulse 78   Temp 98.2 F (36.8 C) (Oral)   Ht 5' 5.25" (1.657 m)   Wt 269 lb (122 kg)   SpO2 96%   BMI 44.42 kg/m  Body mass index is 44.42 kg/m. Physical Exam Vitals and nursing note reviewed.  Constitutional:      General: She is not in acute distress.    Appearance: Normal appearance. She is normal weight. She is not ill-appearing or toxic-appearing.  Eyes:     Extraocular Movements: Extraocular movements intact.     Conjunctiva/sclera: Conjunctivae normal.     Pupils: Pupils are equal, round, and reactive to light.  Cardiovascular:     Rate and Rhythm: Normal rate and regular rhythm.     Heart sounds: No murmur heard. Musculoskeletal:     Right lower leg: No edema.     Left lower leg: Edema (Trace) present.  Skin:    Findings: No erythema or rash.     Comments: Negative Homans.  Neurovascularly intact distally  Neurological:     Mental Status: She is alert and oriented to person, place, and time. Mental status is at baseline.  Psychiatric:        Mood and Affect: Mood normal.        Behavior: Behavior normal.        Thought Content: Thought content  normal.        Judgment: Judgment normal.      No results found. No results found. No results found for this or any previous visit (from the past 24 hour(s)).  Assessment/Plan: ADELAYDE MINNEY is a 51 y.o. female present for OV for  1. Lower extremity edema Provided her with Lasix 20 mg daily as needed. Strongly encouraged her to try a low-sodium diet. Wear compression stockings. Keep feet elevated whenever possible. Exam not concerning today.  Patient with just about a trace of edema.  Vital signs are normal.  Morbid obesity ([place]) Encouraged patient to check with her insurance to see if they cover Darcel Bayley, Ozempic or [facility] for prediabetes with serious comorbidity and morbid obese patient. Weight loss counseling by appointment.  She is aware to schedule appointment for weight loss counseling alone if desired and bring with her a food diary of 2 weeks.  Reviewed expectations re: course of current medical issues. Discussed self-management of symptoms. Outlined signs and symptoms indicating need for more acute intervention. Patient verbalized understanding and all questions were answered. Patient received an After-Visit Summary.    No orders of the defined types were  placed in this encounter.  Meds ordered this encounter  Medications   furosemide (LASIX) 20 MG tablet    Sig: Take 1 tablet (20 mg total) by mouth daily as needed.    Dispense:  30 tablet    Refill:  3   Referral Orders  No referral(s) requested today     Note is dictated utilizing voice recognition software. Although note has been proof read prior to signing, occasional typographical errors still can be missed. If any questions arise, please do not hesitate to call for verification.   electronically signed by:  Howard Pouch, DO  [place]

## 2022-03-15 NOTE — Patient Instructions (Addendum)
Mounjaro Ozempic Wegovy Bmi 44.5 with serious comorbid conditions and prediabetes.    Keep feet elevated.  Wear compression stockings.  Lasix 1 tab a day if needed.

## 2022-06-04 ENCOUNTER — Encounter (HOSPITAL_BASED_OUTPATIENT_CLINIC_OR_DEPARTMENT_OTHER): Payer: Self-pay | Admitting: Obstetrics and Gynecology

## 2022-06-04 ENCOUNTER — Other Ambulatory Visit: Payer: Self-pay

## 2022-06-04 NOTE — Progress Notes (Signed)
Your procedure is scheduled on Monday, 06/24/22.   Report to [name].   Call this number if you have problems the morning of surgery  :[phone].   OUR ADDRESS IS [address].  [facility] ARE LOCATED IN [facility].  PLEASE BRING YOUR INSURANCE CARD AND PHOTO ID DAY OF SURGERY.  ONLY 2 PEOPLE ARE ALLOWED IN  WAITING  ROOM.                                      REMEMBER:  DO NOT EAT FOOD, CANDY GUM OR MINTS  AFTER MIDNIGHT THE NIGHT BEFORE YOUR SURGERY . YOU MAY HAVE CLEAR LIQUIDS FROM MIDNIGHT THE NIGHT BEFORE YOUR SURGERY UNTIL  4:30 AM. NO CLEAR LIQUIDS AFTER   4:30 AM DAY OF SURGERY.  YOU MAY  BRUSH YOUR TEETH MORNING OF SURGERY AND RINSE YOUR MOUTH OUT, NO CHEWING GUM CANDY OR MINTS.     CLEAR LIQUID DIET   Foods Allowed                                                                     Foods Excluded  Coffee and tea, regular and decaf                             liquids that you cannot  Plain Jell-O                                                                   see through such as: Fruit ices (not with fruit pulp)                                     milk, soups, orange juice  Plain  Popsicles                                    All solid food Carbonated beverages, regular and diet                                    Cranberry, grape and apple juices Sports drinks like Gatorade _____________________________________________________________________     TAKE ONLY THESE MEDICATIONS MORNING OF SURGERY: Norvasc, Lipitor, Clonazepam if needed, Cymbalta, Nexium, Tricor, Allegra, Propranolol    UP TO 4 VISITORS  MAY VISIT IN THE EXTENDED RECOVERY ROOM UNTIL 800 PM ONLY.  ONE  VISITOR AGE 60 AND OVER MAY SPEND THE NIGHT AND MUST BE IN EXTENDED RECOVERY ROOM NO LATER THAN 800 PM . YOUR DISCHARGE TIME AFTER YOU SPEND THE NIGHT IS 900 AM THE MORNING AFTER YOUR SURGERY.  YOU MAY PACK  A SMALL OVERNIGHT BAG WITH TOILETRIES FOR  YOUR OVERNIGHT STAY IF YOU WISH.  YOUR PRESCRIPTION MEDICATIONS WILL BE PROVIDED DURING [facility] [place].                                      DO NOT WEAR JEWERLY, MAKE UP. DO NOT WEAR LOTIONS, POWDERS, PERFUMES OR NAIL POLISH ON YOUR FINGERNAILS. TOENAIL POLISH IS OK TO WEAR. DO NOT SHAVE FOR 48 HOURS PRIOR TO DAY OF SURGERY. MEN MAY SHAVE FACE AND NECK. CONTACTS, GLASSES, OR DENTURES MAY NOT BE WORN TO SURGERY.  REMEMBER: NO SMOKING, DRUGS OR ALCOHOL FOR 24 HOURS BEFORE YOUR SURGERY.                                    [facility] IS NOT RESPONSIBLE  FOR ANY BELONGINGS.                                                                    Marland Kitchen           [facility] - Preparing for Surgery Before surgery, you can play an important role.  Because skin is not sterile, your skin needs to be as free of germs as possible.  You can reduce the number of germs on your skin by washing with CHG (chlorahexidine gluconate) soap before surgery.  CHG is an antiseptic cleaner which kills germs and bonds with the skin to continue killing germs even after washing. Please DO NOT use if you have an allergy to CHG or antibacterial soaps.  If your skin becomes reddened/irritated stop using the CHG and inform your nurse when you arrive at Short Stay. Do not shave (including legs and underarms) for at least 48 hours prior to the first CHG shower.  You may shave your face/neck. Please follow these instructions carefully:  1.  Shower with CHG Soap the night before surgery and the  morning of Surgery.  2.  If you choose to wash your hair, wash your hair first as usual with your  normal  shampoo.  3.  After you shampoo, rinse your hair and body thoroughly to remove the  shampoo.                                        4.  Use CHG as you would any other liquid soap.  You can apply chg directly  to the skin and wash , chg soap provided, night before and morning of your surgery.  5.  Apply the CHG Soap to your body ONLY FROM  THE NECK DOWN.   Do not use on face/ open                           Wound or open sores. Avoid contact with eyes, ears mouth and genitals (private parts).                       Wash face,  Genitals (private parts) with your normal soap.             6.  Wash thoroughly, paying special attention to the area where your surgery  will be performed.  7.  Thoroughly rinse your body with warm water from the neck down.  8.  DO NOT shower/wash with your normal soap after using and rinsing off  the CHG Soap.             9.  Pat yourself dry with a clean towel.            10.  Wear clean pajamas.            11.  Place clean sheets on your bed the night of your first shower and do not  sleep with pets. Day of Surgery : Do not apply any lotions/deodorants the morning of surgery.  Please wear clean clothes to the hospital/surgery center.  IF YOU HAVE ANY SKIN IRRITATION OR PROBLEMS WITH THE SURGICAL SOAP, PLEASE GET A BAR OF GOLD DIAL SOAP AND SHOWER THE NIGHT BEFORE YOUR SURGERY AND THE MORNING OF YOUR SURGERY. PLEASE LET THE NURSE KNOW MORNING OF YOUR SURGERY IF YOU HAD ANY PROBLEMS WITH THE SURGICAL SOAP.   ________________________________________________________________________                                                        QUESTIONS [facility] PRE OP NURSE PHONE [phone].

## 2022-06-04 NOTE — Progress Notes (Signed)
Spoke w/ via phone for pre-op interview---Jadie Lab needs dos---- urine pregnancy per anesthesia, surgeon orders pending as of 06/04/22           Lab results------01/30/22 MR Angio Head COVID test -----patient states asymptomatic no test needed Arrive at -------0530 on Monday 06/24/22 NPO after MN NO Solid Food.  Clear liquids from MN until---0430 Med rec completed Medications to take morning of surgery -----Norvasc, Lipitor, Clonazepam prn, Cymbalta, Nexium, Tricor, Allegra, Propranolol Diabetic medication -----n/a Patient instructed no nail polish to be worn day of surgery Patient instructed to bring photo id and insurance card day of surgery Patient aware to have Driver (ride ) / caregiver    for 24 hours after surgery - husband, Lanny Hurst Patient Special Instructions -----Overnight stay instructions given. Pre-Op special Istructions -----Requested orders from Dr. Matthew Saras via Epic IB on 06/04/22. Patient verbalized understanding of instructions that were given at this phone interview. Patient denies shortness of breath, chest pain, fever, cough at this phone interview.    Patient currently takes ASA 325 mg daily. Patient stated she was instructed to do this by Dr. Luanne Bras, Radiologist. I spoke with Stanton Kidney, scheduler for Dr. Matthew Saras on 06/04/22 and let her know that the patient would need clearance to stop taking ASA before surgery. She stated she would reach out to Dr. Estanislado Pandy and then fax [place] the info asap.

## 2022-06-05 ENCOUNTER — Other Ambulatory Visit: Payer: Self-pay | Admitting: Family Medicine

## 2022-06-05 ENCOUNTER — Encounter (HOSPITAL_BASED_OUTPATIENT_CLINIC_OR_DEPARTMENT_OTHER): Payer: Self-pay | Admitting: Obstetrics and Gynecology

## 2022-06-10 ENCOUNTER — Telehealth: Payer: Self-pay

## 2022-06-10 NOTE — Telephone Encounter (Signed)
Surgical clearance forms received on 06/10/22. Patient has been scheduled on 06/14/22 to surgical clearance appt. Forms have been placed on PCP desk

## 2022-06-11 NOTE — H&P (Signed)
NAME: [name], Rissler MEDICAL RECORD NO: [id number] ACCOUNT NO: [id number] DATE OF BIRTH: December 20, 1970 PHYSICIAN: Ralene Bathe. Matthew Saras, MD  History and Physical   DATE OF ADMISSION: 06/24/2022  Surgery upcoming at [facility] on 06/24/2022  CHIEF COMPLAINT:  Menorrhagia secondary to leiomyoma.  HISTORY OF PRESENT ILLNESS:  A 51 year old G0 P0, who has had a prior myomectomy in 2012.  She has continued to have problems related to menorrhagia from fibroids. No significant improvement with her current IUD, which will be removed preoperatively.   6/23 ultrasound in our office showed multiple small fibroids 2.2 x 2.3, 2.3 x 2.4, 2.5 x 2.1 and there may be 1 submucosally 3.1 x 2.2.  She declined conservative options presents at this time for TAH, possible BSO.  At the time of her prior myomectomy  she did have some left adnexal adhesions.  We will try to conserve the right side if normal at the time of surgery.  This procedure including specific risks regarding bleeding, infection, transfusion, wound infection, phlebitis, possible need for ERT postop along with her expected recovery time reviewed, which she understands and accepts.  PAST MEDICAL HISTORY: ALLERGIES:  None.  CURRENT MEDICATIONS:  Amlodipine 10 mg, atorvastatin 80 mg once daily, baclofen p.r.n., clonazepam p.r.n., cyclobenzaprine p.r.n., duloxetine 60 mg daily, Lunesta 3 mg at bedtime p.r.n. sleep, fenofibrate, Naprosyn 500 mg p.r.n., Nurtec ODT 75 mg p.r.n.,  propranolol ER 120 once daily for migraine prophylaxis, quetiapine ER 150 at bedtime, also for migraine prophylaxis.  REVIEW OF SYSTEMS:  Significant for not only history of fibroids, migraine headache, hypertension, dyslipidemia, depression, renal insufficiency, anxiety and she has had a prior intracranial aneurysm treated in the past by endovascular insertion of a  coil that was in 2015.  FAMILY HISTORY:  Significant for mother, father, sister with diabetes.  Her mother  has heart disease.  Mother, father, sister have dyslipidemia.  Her mother and grandmother have had a CVA.  Father had lung cancer due to asbestos exposure. Both father and  sister have history of hypertension.  Sister with depression.  Mother with lupus and COPD, also RA and bipolar disorder.  SOCIAL HISTORY:  She is married.  She is a never smoker.  Occasional exercise and occasional alcohol use.  PHYSICAL EXAMINATION:   VITAL SIGNS:  Temperature 98.2, blood pressure 130/78. HEENT:  Unremarkable. NECK:  Supple, without masses. CARDIOVASCULAR:  Regular rate and rhythm without murmurs, rubs or gallops. BREASTS:  Without masses, tenderness, or nipple discharge. ABDOMEN:  Soft, flat, nontender. PELVIC:  Vulva, vagina, cervix normal.  Uterus upper limit of normal size.  Adnexa negative. EXTREMITIES:  Unremarkable. NEUROLOGIC:  Unremarkable.  IMPRESSION:  Symptomatic leiomyoma.  History of prior myomectomy, failed conservative treatment.  PLAN:  We will plan to remove her IUD 2 weeks preop and obtain medical clearance for surgery.  Procedure and risks discussed as above.  Adnexal adhesions noted around her left ovary at the time of her prior myomectomy, will try to conserve the right  ovary unless significant adhesions noted there also.  This approach was discussed with her.   PUS D: 06/10/2022 1:12:29 pm T: 06/10/2022 2:31:00 pm  JOB: [id number]/ [id number]

## 2022-06-13 NOTE — Progress Notes (Signed)
Received fax from [facility] with ASA instructions for patient for upcoming hysterectomy on 06/24/22.  The instructions per Dr. Estanislado Pandy state, "Cannot stop ASA, may decrease to 81 mg daily for 5 days prior, then resume '325mg'$  daily after surgery." I called the patient and left her a message with the above instructions. I asked that she return my call so I could confirm she had received the instructions. ASA clearance letter placed in patient's chart.

## 2022-06-14 ENCOUNTER — Ambulatory Visit (INDEPENDENT_AMBULATORY_CARE_PROVIDER_SITE_OTHER): Payer: Commercial Managed Care - PPO | Admitting: Family Medicine

## 2022-06-14 ENCOUNTER — Encounter: Payer: Self-pay | Admitting: Family Medicine

## 2022-06-14 VITALS — BP 97/67 | HR 88 | Temp 97.8°F | Wt 265.6 lb

## 2022-06-14 DIAGNOSIS — R7303 Prediabetes: Secondary | ICD-10-CM

## 2022-06-14 DIAGNOSIS — Z01818 Encounter for other preprocedural examination: Secondary | ICD-10-CM | POA: Diagnosis not present

## 2022-06-14 DIAGNOSIS — Z23 Encounter for immunization: Secondary | ICD-10-CM | POA: Diagnosis not present

## 2022-06-14 DIAGNOSIS — I671 Cerebral aneurysm, nonruptured: Secondary | ICD-10-CM

## 2022-06-14 DIAGNOSIS — N1831 Chronic kidney disease, stage 3a: Secondary | ICD-10-CM

## 2022-06-14 DIAGNOSIS — I10 Essential (primary) hypertension: Secondary | ICD-10-CM | POA: Diagnosis not present

## 2022-06-14 DIAGNOSIS — E782 Mixed hyperlipidemia: Secondary | ICD-10-CM

## 2022-06-14 DIAGNOSIS — I729 Aneurysm of unspecified site: Secondary | ICD-10-CM

## 2022-06-14 DIAGNOSIS — F418 Other specified anxiety disorders: Secondary | ICD-10-CM

## 2022-06-14 LAB — BASIC METABOLIC PANEL
BUN: 10 mg/dL (ref 6–23)
CO2: 27 mEq/L (ref 19–32)
Calcium: 9.8 mg/dL (ref 8.4–10.5)
Chloride: 102 mEq/L (ref 96–112)
Creatinine, Ser: 0.93 mg/dL (ref 0.40–1.20)
GFR: 72.52 mL/min (ref >60.00)
Glucose, Bld: 134 mg/dL — ABNORMAL HIGH (ref 70–99)
Potassium: 4.3 mEq/L (ref 3.5–5.1)
Sodium: 137 mEq/L (ref 135–145)

## 2022-06-14 LAB — HEMOGLOBIN A1C: Hgb A1c MFr Bld: 6.3 % (ref 4.6–6.5)

## 2022-06-14 NOTE — Patient Instructions (Signed)
No follow-ups on file.        Great to see you today.  I have refilled the medication(s) we provide.   If labs were collected, we will inform you of lab results once received either by echart message or telephone call.   - echart message- for normal results that have been seen by the patient already.   - telephone call: abnormal results or if patient has not viewed results in their echart.  

## 2022-06-14 NOTE — Progress Notes (Unsigned)
SABEEN Dougherty , 1971-04-04, 51 y.o., female MRN: [id number] Patient Care Team    Relationship Specialty Notifications Start End  Ma Hillock, DO PCP - General Family Medicine  05/01/15   Dan Humphreys  Nurse Practitioner  12/26/15   Molli Posey, MD Consulting Physician Obstetrics and Gynecology  09/13/16   N[place] Crane, MD Consulting Physician Psychiatry  12/21/19   Armbruster, Carlota Raspberry, MD Consulting Physician Gastroenterology  08/22/21     Chief Complaint  Patient presents with   surgical clearance    Pt not fasting     Subjective: Pt presents for an OV for preoperative risk assessment consult.  Procedure:TAH-SO Indication: fibroids/bleeding Anesthesia: General Surgery type risk   - Intermediate risk= abdominal Prior anesthesia complications: none Family history of prior anesthesia complications:none Cardiac: No relevant cardiac history per patient. Pulmonary: N/A Endocrine: prediabetes> a1c collected today Obesity:Body mass index is 43.53 kg/m. Chronic kidney disease: Had experienced CKD 3, but had resolved most recently.  We will recheck labs today. Chronic med that needs to be continued: ASA 81 Anticoagulation: asa 325 mg qd> she has instructions from her surgical team to decrease to 81 mg qd 5 days prior to procedure, then restart 325 mg QD after procedure .     02/12/2022    8:17 AM 11/21/2021    2:47 PM 08/22/2021    1:48 PM 06/21/2020    8:10 AM 03/19/2019    8:41 AM  Depression screen PHQ 2/9  Decreased Interest '1 1 1 '$ 0 1  Down, Depressed, Hopeless '1 1 2 '$ 0 1  PHQ - 2 Score '2 2 3 '$ 0 2  Altered sleeping 0  1  0  Tired, decreased energy '1  1  1  '$ Change in appetite '1  2  2  '$ Feeling bad or failure about yourself  '1  1  2  '$ Trouble concentrating 0  0  0  Moving slowly or fidgety/restless 0  0  1  Suicidal thoughts 0  1  0  PHQ-9 Score '5  9  8  '$ Difficult doing work/chores     Somewhat difficult    Allergies  Allergen Reactions   Ace  Inhibitors Swelling and Other (See Comments)    Angioedema    Social History   Social History Narrative   Lives with husband   Caffeine- 2-12 oz cans daily   Past Medical History:  Diagnosis Date   Anxiety    takes Clonazepam daily as needed   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 Toy Care, [place] 03/2022 per pt   Fibroids 2023   GERD (gastroesophageal reflux disease)    takes Nexium daily   Headache(784.0)    chronic migraine, follows with [facility], Ashok Pall, NP, [place] 07/06/21 as of 06/03/22.   Hyperlipidemia    takes Atorvastatin daily, follows with PCP Dr. Howard Pouch.   Hypertension    takes Amlodipine and Propranolol daily, follows w/ PCP Dr. Howard Pouch, [place] 08/22/21 for preventative health.   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 Howard Pouch, takes Lasix prn   Parotiditis 11/16/2021   PONV (postoperative  nausea and vomiting)    Pre-diabetes 02/12/2022   Hgb A1C 5.9   Precancerous skin lesion    left under arm   SAH (subarachnoid hemorrhage) ([place]) 2014   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   Sleep apnea    MILD, 12/22/15 split night study in Epic   Vitamin B12 deficiency    takes supplement   Wears contact lenses    Wears glasses    Past Surgical History:  Procedure Laterality Date   BRAIN SURGERY  01/2013   aneurysm coiling   COLONOSCOPY  01/16/2021   multiple polyps   DILATION AND CURETTAGE OF UTERUS  2009   IR RADIOLOGIST EVAL & MGMT  12/05/2020   LAPAROTOMY  2004   ovarian cyst   MYOMECTOMY  05/16/2011   Procedure: MYOMECTOMY;  Surgeon: Margarette Asal;  Location: [facility];  Service:  Gynecology;  Laterality: N/A;  abdominal   RADIOLOGY WITH ANESTHESIA N/A 02/18/2013   Procedure: RADIOLOGY WITH ANESTHESIA;  Surgeon: Rob Hickman, MD;  Location: [facility];  Service: Radiology;  Laterality: N/A;   RADIOLOGY WITH ANESTHESIA N/A 01/26/2014   Procedure: RADIOLOGY WITH ANESTHESIA;  Surgeon: Rob Hickman, MD;  Location: [facility];  Service: Radiology;  Laterality: N/A;   RADIOLOGY WITH ANESTHESIA N/A 07/25/2014   Procedure: RADIOLOGY WITH ANESTHESIA;  Surgeon: Rob Hickman, MD;  Location: [facility];  Service: Radiology;  Laterality: N/A;   Family History  Problem Relation Age of Onset   Heart disease Mother 35       cad   Lupus Mother    COPD Mother    Arthritis Mother        rheumatoid   AVM Mother    Diabetes Mother    Bipolar disorder Mother    Drug abuse Mother    Fibromyalgia Mother    Colon polyps Mother    Diabetes Father    Cancer Father        thyroid, lung   Depression Sister    Diabetes Sister    Hyperlipidemia Sister    Hypertension Sister    Cancer Paternal Grandfather        colon   Colon cancer Paternal Grandfather    Alcohol abuse Paternal Uncle    Drug abuse Maternal Grandfather    Alcohol abuse Maternal Grandfather    Esophageal cancer Neg Hx    Stomach cancer Neg Hx    Rectal cancer Neg Hx    Allergies as of 06/14/2022       Reactions   Ace Inhibitors Swelling, Other (See Comments)   Angioedema         Medication List        Accurate as of June 14, 2022 11:59 PM. If you have any questions, ask your nurse or doctor.          STOP taking these medications    Nurtec 75 MG Tbdp Generic drug: Rimegepant Sulfate Stopped by: Howard Pouch, DO       TAKE these medications    amLODipine 10 MG tablet Commonly known as: NORVASC Take 1 tablet (10 mg total) by mouth daily.   aspirin EC 325 MG tablet Take 325 mg by mouth daily.   atorvastatin 80 MG tablet Commonly known as: LIPITOR Take 1 tablet (80 mg total)  by mouth daily.   clonazePAM 0.5 MG tablet Commonly known as: KLONOPIN Take 0.5-1 mg by mouth 2 (two) times  daily. 0.'5mg'$  in the mornnig, '1mg'$  in the evening   DULoxetine 60 MG capsule Commonly known as: CYMBALTA Take 120 mg by mouth daily.   Eszopiclone 3 MG Tabs Take 3 mg by mouth at bedtime.   fenofibrate 145 MG tablet Commonly known as: TRICOR Take 1 tablet (145 mg total) by mouth daily.   fexofenadine 60 MG tablet Commonly known as: ALLEGRA Take 60 mg by mouth daily.   FISH OIL PO Take 1 capsule by mouth daily.   Fluocinolone Acetonide Scalp 0.01 % Oil Apply topically as needed.   furosemide 20 MG tablet Commonly known as: LASIX Take 1 tablet (20 mg total) by mouth daily as needed.   ketoconazole 2 % shampoo Commonly known as: NIZORAL Apply topically once a week.   Mirena (52 MG) 20 MCG/DAY Iud Generic drug: levonorgestrel Mirena 20 mcg/24 hours (6 yrs) 52 mg intrauterine device  Take 1 device by intrauterine route.   multivitamin with minerals Tabs tablet Take 1 tablet by mouth daily.   NEXIUM PO Take by mouth.   prochlorperazine 10 MG tablet Commonly known as: COMPAZINE Take 1 tablet by mouth 3 (three) times daily as needed.   propranolol ER 120 MG 24 hr capsule Commonly known as: INDERAL LA Take 1 capsule (120 mg total) by mouth daily.   QUEtiapine Fumarate 150 MG 24 hr tablet Commonly known as: SEROQUEL XR Take 150 mg by mouth at bedtime.   triamcinolone cream 0.1 % Commonly known as: KENALOG Apply topically 2 (two) times daily.   vitamin B-12 100 MCG tablet Commonly known as: CYANOCOBALAMIN Take 100 mcg by mouth daily. Pt unsure of dosage.        All past medical history, surgical history, allergies, family history, immunizations andmedications were updated in the EMR today and reviewed under the history and medication portions of their EMR.     ROS Negative, with the exception of above mentioned in HPI   Objective:  BP 97/67    Pulse 88   Temp 97.8 F (36.6 C)   Wt 265 lb 9.6 oz (120.5 kg)   SpO2 97%   BMI 43.53 kg/m  Body mass index is 43.53 kg/m. Physical Exam Vitals and nursing note reviewed.  Constitutional:      General: She is not in acute distress.    Appearance: Normal appearance. She is not ill-appearing or toxic-appearing.  HENT:     Head: Normocephalic and atraumatic.     Right Ear: Tympanic membrane, ear canal and external ear normal. There is no impacted cerumen.     Left Ear: Tympanic membrane, ear canal and external ear normal. There is no impacted cerumen.     Nose: No congestion or rhinorrhea.     Mouth/Throat:     Mouth: Mucous membranes are moist.     Pharynx: Oropharynx is clear. No oropharyngeal exudate or posterior oropharyngeal erythema.  Eyes:     General: No scleral icterus.       Right eye: No discharge.        Left eye: No discharge.     Extraocular Movements: Extraocular movements intact.     Conjunctiva/sclera: Conjunctivae normal.     Pupils: Pupils are equal, round, and reactive to light.  Cardiovascular:     Rate and Rhythm: Normal rate and regular rhythm.     Pulses: Normal pulses.     Heart sounds: Normal heart sounds. No murmur heard.    No friction rub. No gallop.  Pulmonary:  Effort: Pulmonary effort is normal. No respiratory distress.     Breath sounds: Normal breath sounds. No stridor. No wheezing, rhonchi or rales.  Chest:     Chest wall: No tenderness.  Abdominal:     General: Abdomen is flat. Bowel sounds are normal. There is no distension.     Palpations: Abdomen is soft. There is no mass.     Tenderness: There is no abdominal tenderness. There is no right CVA tenderness, left CVA tenderness, guarding or rebound.     Hernia: No hernia is present.  Musculoskeletal:        General: No swelling, tenderness or deformity. Normal range of motion.     Cervical back: Normal range of motion and neck supple. No rigidity or tenderness.     Right lower leg: No  edema.     Left lower leg: No edema.  Lymphadenopathy:     Cervical: No cervical adenopathy.  Skin:    General: Skin is warm and dry.     Coloration: Skin is not jaundiced or pale.     Findings: No bruising, erythema, lesion or rash.  Neurological:     General: No focal deficit present.     Mental Status: She is alert and oriented to person, place, and time. Mental status is at baseline.     Cranial Nerves: No cranial nerve deficit.     Sensory: No sensory deficit.     Motor: No weakness.     Coordination: Coordination normal.     Gait: Gait normal.     Deep Tendon Reflexes: Reflexes normal.  Psychiatric:        Mood and Affect: Mood normal.        Behavior: Behavior normal.        Thought Content: Thought content normal.        Judgment: Judgment normal.     No results found. No results found. No results found for this or any previous visit (from the past 24 hour(s)).  Assessment/Plan: ZENIAH BRINEY is a 51 y.o. female present for OV for  Preop examination To the best of my knowledge and per patients reported PMH, there is not a medical contraindication for undergoing surgery.  Patient understands the purpose of preoperative visit is to attempt to minimize surgical complications, and communicate to surgical team chronic conditions and management. No patient is free of risk when undergoing a procedure. The decision about whether to proceed with the operation belongs to the surgeon and the patient. Patient's chronic conditions have been stable.   Essential hypertension Well controlled - Basic Metabolic Panel (BMET) - Hemoglobin A1c - CBC Morbid obesity (HCC) Body mass index is 43.53 kg/m. Prediabetes - Hemoglobin A1c Aneurysm (HCC) She has received instructions on lowering ASA 325 to 81 mg temporarily and restarting ASA 325 after surgery. Need for immunization against influenza - Flu Vaccine QUAD 87moIM (Fluarix, Fluzone & Alfiuria Quad PF) Stage 3a chronic  kidney disease ([place] Resolved-per BMP collected today  Reviewed expectations re: course of current medical issues. Discussed self-management of symptoms. Outlined signs and symptoms indicating need for more acute intervention. Patient verbalized understanding and all questions were answered. Patient received an After-Visit Summary.    Orders Placed This Encounter  Procedures   Flu Vaccine QUAD 647moM (Fluarix, Fluzone & [place]fiuria Quad PF)   Basic Metabolic Panel (BMET)   Hemoglobin A1c   CBC   No orders of the defined types were placed in this encounter.  Referral Orders  No  referral(s) requested today     Note is dictated utilizing voice recognition software. Although note has been proof read prior to signing, occasional typographical errors still can be missed. If any questions arise, please do not hesitate to call for verification.   electronically signed by:  Howard Pouch, DO  [place]

## 2022-06-17 LAB — CBC
HCT: 42.9 % (ref 36.0–46.0)
Hemoglobin: 13.7 g/dL (ref 12.0–15.0)
MCHC: 31.9 g/dL (ref 30.0–36.0)
MCV: 101.2 fl — ABNORMAL HIGH (ref 78.0–100.0)
Platelets: 321 10*3/uL (ref 150.0–400.0)
RBC: 4.24 Mil/uL (ref 3.87–5.11)
RDW: 12.2 % (ref 11.5–15.5)
WBC: 6.8 10*3/uL (ref 4.0–10.5)

## 2022-06-18 ENCOUNTER — Telehealth: Payer: Self-pay | Admitting: Family Medicine

## 2022-06-18 NOTE — Telephone Encounter (Signed)
Completed preop risk assessment and placed in CMA work basket to fax to her surgical team.  Please inform patient we have faxed over her surgical clearance. Her kidney function is normal. Blood cell counts are normal. Her A1c/diabetes marker is a little higher than prior and almost in the diabetic range at 6.3.  This is in the high prediabetic range.  I would encourage her to cut back on high sugar/carb content in her diet.  Once recovered from surgery, routine exercise would be recommended.  We will recheck her A1c at her next routine follow-up to ensure she is not progressing to diabetic range.

## 2022-06-18 NOTE — Progress Notes (Signed)
Pt left message via phone for [employer].  Called and spoke w/ pt via phone today and pt stated she received asa instructions and had no further questions.

## 2022-06-18 NOTE — Telephone Encounter (Signed)
Spoke with patient regarding results/recommendations.  

## 2022-06-20 ENCOUNTER — Encounter: Payer: Self-pay | Admitting: Family Medicine

## 2022-06-20 ENCOUNTER — Encounter (HOSPITAL_COMMUNITY)
Admission: RE | Admit: 2022-06-20 | Discharge: 2022-06-20 | Disposition: A | Discharge status: Home / Self Care | Payer: Commercial Managed Care - PPO | Source: Ambulatory Visit | Attending: Obstetrics and Gynecology | Admitting: Obstetrics and Gynecology

## 2022-06-20 DIAGNOSIS — Z01818 Encounter for other preprocedural examination: Secondary | ICD-10-CM | POA: Insufficient documentation

## 2022-06-20 DIAGNOSIS — D219 Benign neoplasm of connective and other soft tissue, unspecified: Secondary | ICD-10-CM | POA: Insufficient documentation

## 2022-06-20 LAB — CBC
HCT: 39.8 % (ref 36.0–46.0)
Hemoglobin: 12.9 g/dL (ref 12.0–15.0)
MCH: 31.9 pg (ref 26.0–34.0)
MCHC: 32.4 g/dL (ref 30.0–36.0)
MCV: 98.5 fL (ref 80.0–100.0)
Platelets: 342 10*3/uL (ref 150–400)
RBC: 4.04 MIL/uL (ref 3.87–5.11)
RDW: 11.8 % (ref 11.5–15.5)
WBC: 6 10*3/uL (ref 4.0–10.5)
nRBC: 0 % (ref 0.0–0.2)

## 2022-06-20 LAB — BASIC METABOLIC PANEL
Anion gap: 8 (ref 5–15)
BUN: 13 mg/dL (ref 6–20)
CO2: 28 mmol/L (ref 22–32)
Calcium: 9.3 mg/dL (ref 8.9–10.3)
Chloride: 103 mmol/L (ref 98–111)
Creatinine, Ser: 1.03 mg/dL — ABNORMAL HIGH (ref 0.44–1.00)
GFR, Estimated: >60 mL/min (ref >60)
Glucose, Bld: 110 mg/dL — ABNORMAL HIGH (ref 70–99)
Potassium: 3.9 mmol/L (ref 3.5–5.1)
Sodium: 139 mmol/L (ref 135–145)

## 2022-06-24 ENCOUNTER — Encounter (HOSPITAL_BASED_OUTPATIENT_CLINIC_OR_DEPARTMENT_OTHER): Payer: Self-pay | Admitting: Obstetrics and Gynecology

## 2022-06-24 ENCOUNTER — Inpatient Hospital Stay (HOSPITAL_BASED_OUTPATIENT_CLINIC_OR_DEPARTMENT_OTHER): Payer: Commercial Managed Care - PPO | Admitting: Anesthesiology

## 2022-06-24 ENCOUNTER — Other Ambulatory Visit: Payer: Self-pay

## 2022-06-24 ENCOUNTER — Observation Stay (HOSPITAL_BASED_OUTPATIENT_CLINIC_OR_DEPARTMENT_OTHER)
Admission: RE | Admit: 2022-06-24 | Discharge: 2022-06-25 | Disposition: A | Discharge status: Home / Self Care | Payer: Commercial Managed Care - PPO | Attending: Obstetrics and Gynecology | Admitting: Obstetrics and Gynecology

## 2022-06-24 ENCOUNTER — Encounter (HOSPITAL_BASED_OUTPATIENT_CLINIC_OR_DEPARTMENT_OTHER)
Admission: RE | Disposition: A | Discharge status: Home / Self Care | Payer: Self-pay | Source: Home / Self Care | Attending: Obstetrics and Gynecology

## 2022-06-24 DIAGNOSIS — I10 Essential (primary) hypertension: Secondary | ICD-10-CM | POA: Diagnosis not present

## 2022-06-24 DIAGNOSIS — Z90711 Acquired absence of uterus with remaining cervical stump: Secondary | ICD-10-CM | POA: Diagnosis present

## 2022-06-24 DIAGNOSIS — N736 Female pelvic peritoneal adhesions (postinfective): Secondary | ICD-10-CM | POA: Insufficient documentation

## 2022-06-24 DIAGNOSIS — Z01818 Encounter for other preprocedural examination: Principal | ICD-10-CM

## 2022-06-24 DIAGNOSIS — N838 Other noninflammatory disorders of ovary, fallopian tube and broad ligament: Secondary | ICD-10-CM

## 2022-06-24 DIAGNOSIS — F418 Other specified anxiety disorders: Secondary | ICD-10-CM | POA: Diagnosis not present

## 2022-06-24 DIAGNOSIS — N920 Excessive and frequent menstruation with regular cycle: Secondary | ICD-10-CM | POA: Diagnosis not present

## 2022-06-24 DIAGNOSIS — D219 Benign neoplasm of connective and other soft tissue, unspecified: Secondary | ICD-10-CM | POA: Diagnosis present

## 2022-06-24 DIAGNOSIS — Z87891 Personal history of nicotine dependence: Secondary | ICD-10-CM

## 2022-06-24 DIAGNOSIS — D259 Leiomyoma of uterus, unspecified: Secondary | ICD-10-CM | POA: Diagnosis present

## 2022-06-24 HISTORY — DX: Presence of spectacles and contact lenses: Z97.3

## 2022-06-24 HISTORY — DX: Deficiency of other specified B group vitamins: E53.8

## 2022-06-24 HISTORY — PX: HYSTERECTOMY ABDOMINAL WITH SALPINGO-OOPHORECTOMY: SHX6792

## 2022-06-24 HISTORY — DX: Chronic kidney disease, unspecified: N18.9

## 2022-06-24 HISTORY — DX: Acquired absence of uterus with remaining cervical stump: Z90.711

## 2022-06-24 HISTORY — DX: Localized edema: R60.0

## 2022-06-24 HISTORY — DX: Disorder of the skin and subcutaneous tissue, unspecified: L98.9

## 2022-06-24 LAB — TYPE AND SCREEN
ABO/RH(D): O POS
Antibody Screen: NEGATIVE

## 2022-06-24 LAB — POCT PREGNANCY, URINE: Preg Test, Ur: NEGATIVE

## 2022-06-24 SURGERY — HYSTERECTOMY, ABDOMINAL, WITH SALPINGO-OOPHORECTOMY
Anesthesia: General | Site: Abdomen | Laterality: Left

## 2022-06-24 MED ORDER — SODIUM CHLORIDE 0.9 % IV SOLN
2.0000 g | INTRAVENOUS | Status: AC
  Administered 2022-06-24: 2 g via INTRAVENOUS

## 2022-06-24 MED ORDER — PROPOFOL 10 MG/ML IV BOLUS
INTRAVENOUS | Status: AC
  Filled 2022-06-24: qty 20

## 2022-06-24 MED ORDER — ONDANSETRON HCL 4 MG PO TABS: 4.0000 mg | ORAL_TABLET | Freq: Four times a day (QID) | ORAL | Status: DC | PRN

## 2022-06-24 MED ORDER — BUPIVACAINE LIPOSOME 1.3 % IJ SUSP
INTRAMUSCULAR | Status: DC | PRN
  Administered 2022-06-24: 30 mL

## 2022-06-24 MED ORDER — ATORVASTATIN CALCIUM 80 MG PO TABS
80.0000 mg | ORAL_TABLET | Freq: Every day | ORAL | Status: DC
  Administered 2022-06-25: 80 mg via ORAL
  Filled 2022-06-24: qty 1

## 2022-06-24 MED ORDER — DEXTROSE IN LACTATED RINGERS 5 % IV SOLN: INTRAVENOUS | Status: DC

## 2022-06-24 MED ORDER — PANTOPRAZOLE SODIUM 40 MG PO TBEC
40.0000 mg | DELAYED_RELEASE_TABLET | Freq: Every day | ORAL | Status: DC
  Administered 2022-06-24 – 2022-06-25 (×2): 40 mg via ORAL

## 2022-06-24 MED ORDER — ONDANSETRON HCL 4 MG/2ML IJ SOLN
INTRAMUSCULAR | Status: AC
  Filled 2022-06-24: qty 2

## 2022-06-24 MED ORDER — BUPIVACAINE LIPOSOME 1.3 % IJ SUSP
INTRAMUSCULAR | Status: AC
  Filled 2022-06-24: qty 20

## 2022-06-24 MED ORDER — GABAPENTIN 300 MG PO CAPS
ORAL_CAPSULE | ORAL | Status: AC
  Filled 2022-06-24: qty 1

## 2022-06-24 MED ORDER — ACETAMINOPHEN 500 MG PO TABS: 1000.0000 mg | ORAL_TABLET | Freq: Once | ORAL | Status: DC | PRN

## 2022-06-24 MED ORDER — PHENYLEPHRINE HCL (PRESSORS) 10 MG/ML IV SOLN
INTRAVENOUS | Status: DC | PRN
  Administered 2022-06-24 (×5): 80 ug via INTRAVENOUS

## 2022-06-24 MED ORDER — PROPRANOLOL HCL ER 60 MG PO CP24
120.0000 mg | ORAL_CAPSULE | Freq: Every day | ORAL | Status: DC
  Administered 2022-06-25: 120 mg via ORAL
  Filled 2022-06-24: qty 1
  Filled 2022-06-24: qty 2

## 2022-06-24 MED ORDER — ROCURONIUM BROMIDE 10 MG/ML (PF) SYRINGE
PREFILLED_SYRINGE | INTRAVENOUS | Status: AC
  Filled 2022-06-24: qty 10

## 2022-06-24 MED ORDER — DEXAMETHASONE SODIUM PHOSPHATE 4 MG/ML IJ SOLN
INTRAMUSCULAR | Status: DC | PRN
  Administered 2022-06-24: 5 mg via INTRAVENOUS

## 2022-06-24 MED ORDER — GABAPENTIN 300 MG PO CAPS
300.0000 mg | ORAL_CAPSULE | ORAL | Status: AC
  Administered 2022-06-24: 300 mg via ORAL

## 2022-06-24 MED ORDER — HYDROCODONE-ACETAMINOPHEN 5-325 MG PO TABS
ORAL_TABLET | ORAL | Status: AC
  Filled 2022-06-24: qty 2

## 2022-06-24 MED ORDER — PHENYLEPHRINE 80 MCG/ML (10ML) SYRINGE FOR IV PUSH (FOR BLOOD PRESSURE SUPPORT)
PREFILLED_SYRINGE | INTRAVENOUS | Status: AC
  Filled 2022-06-24: qty 10

## 2022-06-24 MED ORDER — SCOPOLAMINE 1 MG/3DAYS TD PT72
MEDICATED_PATCH | TRANSDERMAL | Status: AC
  Filled 2022-06-24: qty 1

## 2022-06-24 MED ORDER — FENTANYL CITRATE (PF) 100 MCG/2ML IJ SOLN
INTRAMUSCULAR | Status: DC | PRN
  Administered 2022-06-24: 50 ug via INTRAVENOUS
  Administered 2022-06-24: 150 ug via INTRAVENOUS
  Administered 2022-06-24: 50 ug via INTRAVENOUS

## 2022-06-24 MED ORDER — OXYCODONE HCL 5 MG PO TABS: 5.0000 mg | ORAL_TABLET | Freq: Once | ORAL | Status: DC | PRN

## 2022-06-24 MED ORDER — SCOPOLAMINE 1 MG/3DAYS TD PT72
MEDICATED_PATCH | TRANSDERMAL | Status: DC | PRN
  Administered 2022-06-24: 1 via TRANSDERMAL

## 2022-06-24 MED ORDER — CLONAZEPAM 1 MG PO TABS
1.0000 mg | ORAL_TABLET | Freq: Two times a day (BID) | ORAL | Status: DC
  Administered 2022-06-24 – 2022-06-25 (×2): 1 mg via ORAL
  Filled 2022-06-24: qty 1
  Filled 2022-06-24: qty 2

## 2022-06-24 MED ORDER — ONDANSETRON HCL 4 MG/2ML IJ SOLN
INTRAMUSCULAR | Status: DC | PRN
  Administered 2022-06-24: 4 mg via INTRAVENOUS

## 2022-06-24 MED ORDER — AMLODIPINE BESYLATE 10 MG PO TABS
10.0000 mg | ORAL_TABLET | Freq: Every day | ORAL | Status: DC
  Administered 2022-06-25: 10 mg via ORAL
  Filled 2022-06-24: qty 1

## 2022-06-24 MED ORDER — 0.9 % SODIUM CHLORIDE (POUR BTL) OPTIME
TOPICAL | Status: DC | PRN
  Administered 2022-06-24 (×2): 1000 mL

## 2022-06-24 MED ORDER — MIDAZOLAM HCL 5 MG/5ML IJ SOLN
INTRAMUSCULAR | Status: DC | PRN
  Administered 2022-06-24: 2 mg via INTRAVENOUS

## 2022-06-24 MED ORDER — HYDROMORPHONE HCL 1 MG/ML IJ SOLN
0.2000 mg | INTRAMUSCULAR | Status: DC | PRN
  Administered 2022-06-24: 0.6 mg via INTRAVENOUS

## 2022-06-24 MED ORDER — ACETAMINOPHEN 10 MG/ML IV SOLN: 1000.0000 mg | Freq: Once | INTRAVENOUS | Status: AC | PRN

## 2022-06-24 MED ORDER — PANTOPRAZOLE SODIUM 40 MG PO TBEC
DELAYED_RELEASE_TABLET | ORAL | Status: AC
  Filled 2022-06-24: qty 1

## 2022-06-24 MED ORDER — POVIDONE-IODINE 10 % EX SWAB: 2.0000 | Freq: Once | CUTANEOUS | Status: DC

## 2022-06-24 MED ORDER — BUPIVACAINE HCL (PF) 0.25 % IJ SOLN
INTRAMUSCULAR | Status: AC
  Filled 2022-06-24: qty 30

## 2022-06-24 MED ORDER — GABAPENTIN 100 MG PO CAPS
ORAL_CAPSULE | ORAL | Status: AC
  Filled 2022-06-24: qty 2

## 2022-06-24 MED ORDER — PHENYLEPHRINE HCL (PRESSORS) 10 MG/ML IV SOLN
INTRAVENOUS | Status: AC
  Filled 2022-06-24: qty 1

## 2022-06-24 MED ORDER — GABAPENTIN 100 MG PO CAPS
200.0000 mg | ORAL_CAPSULE | Freq: Two times a day (BID) | ORAL | Status: DC
  Administered 2022-06-24 – 2022-06-25 (×2): 200 mg via ORAL

## 2022-06-24 MED ORDER — DEXAMETHASONE SODIUM PHOSPHATE 10 MG/ML IJ SOLN
INTRAMUSCULAR | Status: AC
  Filled 2022-06-24: qty 1

## 2022-06-24 MED ORDER — QUETIAPINE FUMARATE ER 50 MG PO TB24
150.0000 mg | ORAL_TABLET | Freq: Every day | ORAL | Status: DC
  Administered 2022-06-24: 150 mg via ORAL
  Filled 2022-06-24 (×3): qty 3

## 2022-06-24 MED ORDER — ZOLPIDEM TARTRATE 5 MG PO TABS
ORAL_TABLET | ORAL | Status: AC
  Filled 2022-06-24: qty 1

## 2022-06-24 MED ORDER — LIDOCAINE HCL (PF) 2 % IJ SOLN
INTRAMUSCULAR | Status: AC
  Filled 2022-06-24: qty 5

## 2022-06-24 MED ORDER — PROPOFOL 10 MG/ML IV BOLUS
INTRAVENOUS | Status: DC | PRN
  Administered 2022-06-24: 200 mg via INTRAVENOUS

## 2022-06-24 MED ORDER — METHYLENE BLUE 1 % INJ SOLN
INTRAVENOUS | Status: AC
  Filled 2022-06-24: qty 10

## 2022-06-24 MED ORDER — FENTANYL CITRATE (PF) 100 MCG/2ML IJ SOLN
25.0000 ug | INTRAMUSCULAR | Status: DC | PRN
  Administered 2022-06-24 (×2): 50 ug via INTRAVENOUS

## 2022-06-24 MED ORDER — DULOXETINE HCL 60 MG PO CPEP
120.0000 mg | ORAL_CAPSULE | Freq: Every day | ORAL | Status: DC
  Administered 2022-06-25: 120 mg via ORAL
  Filled 2022-06-24: qty 2

## 2022-06-24 MED ORDER — EPHEDRINE 5 MG/ML INJ
INTRAVENOUS | Status: AC
  Filled 2022-06-24: qty 5

## 2022-06-24 MED ORDER — MENTHOL 3 MG MT LOZG: 1.0000 | LOZENGE | OROMUCOSAL | Status: DC | PRN

## 2022-06-24 MED ORDER — GLYCOPYRROLATE 0.2 MG/ML IJ SOLN
INTRAMUSCULAR | Status: DC | PRN
  Administered 2022-06-24: .2 mg via INTRAVENOUS

## 2022-06-24 MED ORDER — SODIUM CHLORIDE (PF) 0.9 % IJ SOLN
INTRAMUSCULAR | Status: AC
  Filled 2022-06-24: qty 20

## 2022-06-24 MED ORDER — HYDROMORPHONE HCL 1 MG/ML IJ SOLN
INTRAMUSCULAR | Status: AC
  Filled 2022-06-24: qty 1

## 2022-06-24 MED ORDER — SUGAMMADEX SODIUM 200 MG/2ML IV SOLN
INTRAVENOUS | Status: DC | PRN
  Administered 2022-06-24: 200 mg via INTRAVENOUS

## 2022-06-24 MED ORDER — KETOROLAC TROMETHAMINE 30 MG/ML IJ SOLN
INTRAMUSCULAR | Status: DC | PRN
  Administered 2022-06-24: 30 mg via INTRAVENOUS

## 2022-06-24 MED ORDER — ZOLPIDEM TARTRATE 5 MG PO TABS
5.0000 mg | ORAL_TABLET | Freq: Every evening | ORAL | Status: DC | PRN
  Administered 2022-06-24: 5 mg via ORAL

## 2022-06-24 MED ORDER — ONDANSETRON HCL 4 MG/2ML IJ SOLN: 4.0000 mg | Freq: Four times a day (QID) | INTRAMUSCULAR | Status: DC | PRN

## 2022-06-24 MED ORDER — GLYCOPYRROLATE PF 0.2 MG/ML IJ SOSY
PREFILLED_SYRINGE | INTRAMUSCULAR | Status: AC
  Filled 2022-06-24: qty 1

## 2022-06-24 MED ORDER — FENTANYL CITRATE (PF) 250 MCG/5ML IJ SOLN
INTRAMUSCULAR | Status: AC
  Filled 2022-06-24: qty 5

## 2022-06-24 MED ORDER — ACETAMINOPHEN 160 MG/5ML PO SOLN: 1000.0000 mg | Freq: Once | ORAL | Status: DC | PRN

## 2022-06-24 MED ORDER — SODIUM CHLORIDE 0.9 % IV SOLN
INTRAVENOUS | Status: AC
  Filled 2022-06-24: qty 2

## 2022-06-24 MED ORDER — SODIUM CHLORIDE 0.9 % IV SOLN: INTRAVENOUS | Status: DC

## 2022-06-24 MED ORDER — ROCURONIUM BROMIDE 100 MG/10ML IV SOLN
INTRAVENOUS | Status: DC | PRN
  Administered 2022-06-24: 20 mg via INTRAVENOUS
  Administered 2022-06-24: 80 mg via INTRAVENOUS

## 2022-06-24 MED ORDER — MIDAZOLAM HCL 2 MG/2ML IJ SOLN
INTRAMUSCULAR | Status: AC
  Filled 2022-06-24: qty 2

## 2022-06-24 MED ORDER — LIDOCAINE HCL (CARDIAC) PF 100 MG/5ML IV SOSY
PREFILLED_SYRINGE | INTRAVENOUS | Status: DC | PRN
  Administered 2022-06-24: 60 mg via INTRAVENOUS

## 2022-06-24 MED ORDER — FENTANYL CITRATE (PF) 100 MCG/2ML IJ SOLN
INTRAMUSCULAR | Status: AC
  Filled 2022-06-24: qty 2

## 2022-06-24 MED ORDER — HYDROCODONE-ACETAMINOPHEN 5-325 MG PO TABS
1.0000 | ORAL_TABLET | ORAL | Status: DC | PRN
  Administered 2022-06-24 – 2022-06-25 (×5): 2 via ORAL

## 2022-06-24 MED ORDER — OXYCODONE HCL 5 MG/5ML PO SOLN: 5.0000 mg | Freq: Once | ORAL | Status: DC | PRN

## 2022-06-24 MED ORDER — EPHEDRINE SULFATE (PRESSORS) 50 MG/ML IJ SOLN
INTRAMUSCULAR | Status: DC | PRN
  Administered 2022-06-24: 15 mg via INTRAVENOUS
  Administered 2022-06-24: 10 mg via INTRAVENOUS

## 2022-06-24 SURGICAL SUPPLY — 54 items
APL SKNCLS STERI-STRIP NONHPOA (GAUZE/BANDAGES/DRESSINGS) ×1
BARRIER ADHS 3X4 INTERCEED (GAUZE/BANDAGES/DRESSINGS) IMPLANT
BENZOIN TINCTURE PRP APPL 2/3 (GAUZE/BANDAGES/DRESSINGS) ×1 IMPLANT
BLADE EXTENDED COATED 6.5IN (ELECTRODE) IMPLANT
BRR ADH 4X3 ABS CNTRL BYND (GAUZE/BANDAGES/DRESSINGS)
CELLS DAT CNTRL 66122 CELL SVR (MISCELLANEOUS) IMPLANT
DRAPE WARM FLUID 44X44 (DRAPES) ×1 IMPLANT
DRSG OPSITE POSTOP 4X10 (GAUZE/BANDAGES/DRESSINGS) IMPLANT
DURAPREP 26ML APPLICATOR (WOUND CARE) ×1 IMPLANT
GAUZE PAD ABD 8X10 STRL (GAUZE/BANDAGES/DRESSINGS) ×1 IMPLANT
GAUZE SPONGE 4X4 12PLY STRL (GAUZE/BANDAGES/DRESSINGS) ×2 IMPLANT
GLOVE BIO SURGEON STRL SZ7 (GLOVE) ×2 IMPLANT
GOWN STRL REUS W/TWL LRG LVL3 (GOWN DISPOSABLE) ×3 IMPLANT
HOLDER FOLEY CATH W/STRAP (MISCELLANEOUS) ×1 IMPLANT
KIT TURNOVER CYSTO (KITS) ×1 IMPLANT
LIGASURE IMPACT 36 18CM CVD LR (INSTRUMENTS) IMPLANT
MANIPULATOR UTERINE 4.5 ZUMI (MISCELLANEOUS) IMPLANT
NDL SAFETY ECLIP 18X1.5 (MISCELLANEOUS) IMPLANT
NDL SPNL 22GX3.5 QUINCKE BK (NEEDLE) ×1 IMPLANT
NEEDLE HYPO 22GX1.5 SAFETY (NEEDLE) ×1 IMPLANT
NEEDLE SPNL 22GX3.5 QUINCKE BK (NEEDLE) ×1 IMPLANT
NS IRRIG 500ML POUR BTL (IV SOLUTION) ×2 IMPLANT
PACK ABDOMINAL GYN (CUSTOM PROCEDURE TRAY) ×1 IMPLANT
PAD OB MATERNITY 4.3X12.25 (PERSONAL CARE ITEMS) ×1 IMPLANT
RETRACTOR WND ALEXIS 18 MED (MISCELLANEOUS) IMPLANT
RETRACTOR WND ALEXIS 25 LRG (MISCELLANEOUS) IMPLANT
RTRCTR WOUND ALEXIS 18CM MED (MISCELLANEOUS)
RTRCTR WOUND ALEXIS 18CM SML (INSTRUMENTS)
RTRCTR WOUND ALEXIS 25CM LRG (MISCELLANEOUS) ×1
SAVER CELL AAL HAEMONETICS (INSTRUMENTS) IMPLANT
SPIKE FLUID TRANSFER (MISCELLANEOUS) IMPLANT
SPONGE T-LAP 4X18 ~~LOC~~+RFID (SPONGE) ×1 IMPLANT
STAPLER VISISTAT 35W (STAPLE) IMPLANT
STRIP CLOSURE SKIN 1/2X4 (GAUZE/BANDAGES/DRESSINGS) IMPLANT
SUT MNCRL AB 4-0 PS2 18 (SUTURE) IMPLANT
SUT PDS AB 0 CT1 27 (SUTURE) IMPLANT
SUT PDS AB 0 CTX 60 (SUTURE) IMPLANT
SUT VIC AB 0 CT1 18XCR BRD 8 (SUTURE) IMPLANT
SUT VIC AB 0 CT1 18XCR BRD8 (SUTURE) IMPLANT
SUT VIC AB 0 CT1 27 (SUTURE)
SUT VIC AB 0 CT1 27XBRD ANBCTR (SUTURE) IMPLANT
SUT VIC AB 0 CT1 8-18 (SUTURE) ×1
SUT VIC AB 2-0 CT1 (SUTURE) IMPLANT
SUT VIC AB 2-0 CT1 27 (SUTURE) ×1
SUT VIC AB 2-0 CT1 TAPERPNT 27 (SUTURE) IMPLANT
SUT VIC AB 3-0 CT1 27 (SUTURE)
SUT VIC AB 3-0 CT1 TAPERPNT 27 (SUTURE) IMPLANT
SUT VICRYL 0 TIES 12 18 (SUTURE) ×1 IMPLANT
SYR 10ML LL (SYRINGE) ×2 IMPLANT
SYR 50ML LL SCALE MARK (SYRINGE) IMPLANT
SYR CONTROL 10ML LL (SYRINGE) ×1 IMPLANT
TOWEL OR 17X26 10 PK STRL BLUE (TOWEL DISPOSABLE) ×1 IMPLANT
TRAY FOLEY W/BAG SLVR 14FR LF (SET/KITS/TRAYS/PACK) ×1 IMPLANT
WATER STERILE IRR 500ML POUR (IV SOLUTION) ×1 IMPLANT

## 2022-06-24 NOTE — Anesthesia Procedure Notes (Signed)
Procedure Name: Intubation Date/Time: 06/24/2022 7:36 AM  Performed by: Justice Rocher, CRNAPre-anesthesia Checklist: Patient identified, Emergency Drugs available, Suction available, Patient being monitored and Timeout performed Patient Re-evaluated:Patient Re-evaluated prior to induction Oxygen Delivery Method: Circle system utilized Preoxygenation: Pre-oxygenation with 100% oxygen Induction Type: IV induction Ventilation: Mask ventilation without difficulty Laryngoscope Size: Mac and 3 Grade View: Grade II Tube type: Oral Tube size: 7.0 mm Number of attempts: 1 Airway Equipment and Method: Stylet and Oral airway Placement Confirmation: ETT inserted through vocal cords under direct vision, positive ETCO2, breath sounds checked- equal and bilateral and CO2 detector Secured at: 23 cm Tube secured with: Tape Dental Injury: Teeth and Oropharynx as per pre-operative assessment  Comments: Lips chapped preop

## 2022-06-24 NOTE — Anesthesia Preprocedure Evaluation (Addendum)
Anesthesia Evaluation  Patient identified by MRN, date of birth, ID band Patient awake    Reviewed: Allergy & Precautions, NPO status , Patient's Chart, lab work & pertinent test results  History of Anesthesia Complications (+) PONV and history of anesthetic complications  Airway Mallampati: IV  TM Distance: >3 FB Neck ROM: Full  Mouth opening: Limited Mouth Opening  Dental  (+) Teeth Intact, Dental Advisory Given   Pulmonary neg shortness of breath, sleep apnea , neg COPD, neg recent URI, former smoker,    breath sounds clear to auscultation       Cardiovascular hypertension, Pt. on medications (-) angina(-) Past MI  Rhythm:Regular     Neuro/Psych  Headaches, PSYCHIATRIC DISORDERS Anxiety Depression    GI/Hepatic Neg liver ROS, GERD  ,  Endo/Other  Morbid obesity  Renal/GU Renal diseaseLab Results      Component                Value               Date                      CREATININE               1.03 (H)            06/20/2022           Lab Results      Component                Value               Date                      K                        3.9                 06/20/2022                Musculoskeletal   Abdominal   Peds  Hematology negative hematology ROS (+) Lab Results      Component                Value               Date                      WBC                      6.0                 06/20/2022                HGB                      12.9                06/20/2022                HCT                      39.8                06/20/2022                MCV  98.5                06/20/2022                PLT                      342                 06/20/2022              Anesthesia Other Findings SAH (subarachnoid hemorrhage) ([place]) 2014 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   Reproductive/Obstetrics                             Anesthesia Physical Anesthesia Plan  ASA: 3  Anesthesia Plan: General   Post-op Pain Management: Ofirmev IV (intra-op)* and Gabapentin PO (pre-op)*   Induction: Intravenous  PONV Risk Score and Plan: 4 or greater and Ondansetron, Dexamethasone, Propofol infusion and Midazolam  Airway Management Planned: Oral ETT  Additional Equipment: None  Intra-op Plan:   Post-operative Plan: Extubation in OR  Informed Consent: I have reviewed the patients History and Physical, chart, labs and discussed the procedure including the risks, benefits and alternatives for the proposed anesthesia with the patient or authorized representative who has indicated his/her understanding and acceptance.     Dental advisory given  Plan Discussed with: CRNA  Anesthesia Plan Comments:        Anesthesia Quick Evaluation

## 2022-06-24 NOTE — Addendum Note (Signed)
Addendum  created 06/24/22 1441 by Justice Rocher, CRNA   Charge Capture section accepted

## 2022-06-24 NOTE — Progress Notes (Signed)
The patient was re-examined with no change in status 

## 2022-06-24 NOTE — Transfer of Care (Signed)
Immediate Anesthesia Transfer of Care Note  Patient: [name]  Procedure(s) Performed: Procedure(s) (LRB): TOTAL ABDOMINAL HYSTERECTOMY, LEFT SALPINGO-OOPHORECTOMY, RIGHT SALPINGECTOMY (Left)  Patient Location: PACU  Anesthesia Type: General  Level of Consciousness: awake, sedated, patient cooperative and responds to stimulation  Airway & Oxygen Therapy: Patient Spontanous Breathing and Patient connected to [place] oxygen  Post-op Assessment: Report given to PACU RN, Post -op Vital signs reviewed and stable and Patient moving all extremities  Post vital signs: Reviewed and stable  Complications: No apparent anesthesia complications

## 2022-06-24 NOTE — Progress Notes (Signed)
Received tc from surgeon's office [facility] requesting inpatient order be changed to correct status of observation status. Order changed per office request, office stated MD will change and approve order if morning upon assessment of patient.  Lyndel Pleasure, RN

## 2022-06-24 NOTE — Anesthesia Postprocedure Evaluation (Signed)
Anesthesia Post Note  Patient: Brielyn L Hilgers  Procedure(s) Performed: TOTAL ABDOMINAL HYSTERECTOMY, LEFT SALPINGO-OOPHORECTOMY, RIGHT SALPINGECTOMY (Left: Abdomen)     Patient location during evaluation: PACU Anesthesia Type: General Level of consciousness: awake and alert Pain management: pain level controlled Vital Signs Assessment: post-procedure vital signs reviewed and stable Respiratory status: spontaneous breathing, nonlabored ventilation and respiratory function stable Cardiovascular status: stable and blood pressure returned to baseline Postop Assessment: no apparent nausea or vomiting Anesthetic complications: no   No notable events documented.  Last Vitals:  Vitals:   06/24/22 1148 06/24/22 1245  BP: 129/85 129/89  Pulse: 72 70  Resp: 16 16  Temp: 36.4 C   SpO2: 95% 95%    Last Pain:  Vitals:   06/24/22 1151  TempSrc:   PainSc: 6                  Tavaughn Silguero

## 2022-06-24 NOTE — Op Note (Signed)
Preoperative diagnosis: Symptomatic leiomyoma, left adnexal adhesions  Postoperative diagnosis: Same  Procedure laparotomy with lysis of adhesions, LSO, supracervical hysterectomy, right salpingectomy  Surgeon: Matthew Saras  Assistant: Vallarie Mare  EBL: 100 cc  Procedure and findings:  Patient was taken to the operating room after adequate level general anesthesia was obtained patient's upon the abdomen perineum and vagina prepped and draped separately and a Foley catheter was position.  Appropriate timeout was taken at that point.  Transverse incision made through her old myomectomy incision which is well-healed, this was carried down the fascia which was incised and extended transversely.  Rectus muscle divided in the midline, peritoneum entered superiorly without incident and extended vertically.  There were some right lateral omental adhesions, this was lysed in an avascular plane, there was no bowel in that area this was released easily and Alexis retractor was then positioned, patient placed in [facility] and the bowel was packed superiorly out of the field.  Findings demonstrated dense left adnexal adhesions, the right tube and ovary appeared to be normal there were 3-4 fibroids the largest of which was 4 cm consistent with her preoperative ultrasound.  Upper abdomen and appendix appeared to be normal.  Long Kelly clamps were then placed across the utero-ovarian pedicle starting on the left with the LigaSure the round ligament was coagulated and divided, initially the left utero-ovarian pedicle was coagulated with the LigaSure and released peritoneum carried around to the midline anteriorly.  The exact same repeated on the opposite side also performing salpingectomy on the right with conservation of the right ovary.  The bladder was dissected below the cervicovaginal junction with sharp and blunt dissection, in sequential manner using the LigaSure of the ascending branch of the uterine artery was  coagulated and divided.  She had a long cervix with a deep pelvis, after trying to get the bladder dissected below, decision made to proceed with supracervical hysterectomy, the specimen was excised, the canal was coagulated with the Bovie and 2-0 Vicryl interrupted sutures were then used to reapproximate across the cervical stump this was irrigated noted to be hemostatic.  Attention directed to the left ovary which was stuck to the left pelvic sidewall, this was dissected in an avascular plane and blunt dissection was then used once it was partially freed up.  The retroperitoneal space on that side was opened and the left IP ligament could be seen easily, the course of the ureter was well below.  The remaining pedicle with the left IP ligament was then clamped divided first free tie followed by suture ligature 0 Vicryl.  Pelvis was then irrigated reinspected the operative sites noted to be hemostatic.  Prior to closure sponge, needle, instrument counts reported as correct x2 peritoneum peritoneum closed with a 2-0 Vicryl running suture the same on the rectus muscles in the midline double looped 0 PDS for the fascia.  Exparel and Marcaine was then injected subfascially and subcutaneously for postoperative analgesia.  Subcutaneous tissue was hemostatic after irrigation, skin closed with a 4-0 Monocryl subcuticular and a pressure dressing.  Clear urine noted at the end of the case.  She tolerated this well went to recovery room in good condition.  Dictated with [employer]  Margarette Asal MD

## 2022-06-25 DIAGNOSIS — D259 Leiomyoma of uterus, unspecified: Secondary | ICD-10-CM | POA: Diagnosis not present

## 2022-06-25 LAB — CBC
HCT: 34.1 % — ABNORMAL LOW (ref 36.0–46.0)
Hemoglobin: 11.2 g/dL — ABNORMAL LOW (ref 12.0–15.0)
MCH: 32.2 pg (ref 26.0–34.0)
MCHC: 32.8 g/dL (ref 30.0–36.0)
MCV: 98 fL (ref 80.0–100.0)
Platelets: 379 10*3/uL (ref 150–400)
RBC: 3.48 MIL/uL — ABNORMAL LOW (ref 3.87–5.11)
RDW: 11.7 % (ref 11.5–15.5)
WBC: 15 10*3/uL — ABNORMAL HIGH (ref 4.0–10.5)
nRBC: 0 % (ref 0.0–0.2)

## 2022-06-25 LAB — SURGICAL PATHOLOGY

## 2022-06-25 MED ORDER — PANTOPRAZOLE SODIUM 40 MG PO TBEC
DELAYED_RELEASE_TABLET | ORAL | Status: AC
  Filled 2022-06-25: qty 1

## 2022-06-25 MED ORDER — IBUPROFEN 200 MG PO TABS: 800.0000 mg | ORAL_TABLET | Freq: Three times a day (TID) | ORAL | 1 refills | Status: DC | PRN

## 2022-06-25 MED ORDER — HYDROCODONE-ACETAMINOPHEN 5-325 MG PO TABS: 1.0000 | ORAL_TABLET | ORAL | 0 refills | Status: DC | PRN

## 2022-06-25 MED ORDER — HYDROCODONE-ACETAMINOPHEN 5-325 MG PO TABS
ORAL_TABLET | ORAL | Status: AC
  Filled 2022-06-25: qty 2

## 2022-06-25 MED ORDER — GABAPENTIN 100 MG PO CAPS
ORAL_CAPSULE | ORAL | Status: AC
  Filled 2022-06-25: qty 2

## 2022-06-25 NOTE — Discharge Summary (Signed)
Physician Discharge Summary  Patient ID: [name] MRN: [id number] DOB/AGE: 49-Aug-1972 49 y.o.  Admit date: 06/24/2022 Discharge date: 06/25/2022  Admission Diagnoses:  Discharge Diagnoses:  Principal Problem:   S/P abdominal supracervical subtotal hysterectomy Active Problems:   Leiomyoma   Discharged Condition: good  Hospital Course: supracx hyst, LSO and R salpingectomy performed>>>R ovary conserved. On POD 1, was afeb, voiding OK,tol Reg diet  Consults: None  Significant Diagnostic Studies: labs:  Results for orders placed or performed during the hospital encounter of 06/24/22 (from the past 48 hour(s))  Pregnancy, urine POC     Status: None   Collection Time: 06/24/22  5:45 AM  Result Value Ref Range   Preg Test, Ur NEGATIVE NEGATIVE    Comment:        THE SENSITIVITY OF THIS METHODOLOGY IS >24 mIU/mL   CBC     Status: Abnormal   Collection Time: 06/25/22  2:26 AM  Result Value Ref Range   WBC 15.0 (H) 4.0 - 10.5 K/uL   RBC 3.48 (L) 3.87 - 5.11 MIL/uL   Hemoglobin 11.2 (L) 12.0 - 15.0 g/dL   HCT 34.1 (L) 36.0 - 46.0 %   MCV 98.0 80.0 - 100.0 fL   MCH 32.2 26.0 - 34.0 pg   MCHC 32.8 30.0 - 36.0 g/dL   RDW 11.7 11.5 - 15.5 %   Platelets 379 150 - 400 K/uL   nRBC 0.0 0.0 - 0.2 %    Comment: Performed at [facility], [place] [address] [place], [place] [address]     Treatments: surgery: Supracx hyst, LSO, R ovary conserved  Discharge Exam: Blood pressure 129/86, pulse 73, temperature 97.9 F (36.6 C), temperature source Axillary, resp. rate 14, height '5\' 5"'$  (1.651 m), weight 118.8 kg, SpO2 94 %. Abd soft + BS, dressing dry  Disposition: Discharge disposition: 01-Home or Self Care          Follow-up Information     Molli Posey, MD. Schedule an appointment as soon as possible for a visit in 1 week(s).   Specialty: Obstetrics and Gynecology Contact information: [place]  [id number] [phone]                 Signed: Margarette Asal 06/25/2022, 8:52 AM

## 2022-06-26 ENCOUNTER — Encounter (HOSPITAL_BASED_OUTPATIENT_CLINIC_OR_DEPARTMENT_OTHER): Payer: Self-pay | Admitting: Obstetrics and Gynecology

## 2022-08-29 ENCOUNTER — Encounter: Payer: Commercial Managed Care - PPO | Admitting: Family Medicine

## 2022-09-13 ENCOUNTER — Encounter: Payer: Self-pay | Admitting: Family Medicine

## 2022-09-13 ENCOUNTER — Ambulatory Visit (INDEPENDENT_AMBULATORY_CARE_PROVIDER_SITE_OTHER): Payer: Commercial Managed Care - PPO | Admitting: Family Medicine

## 2022-09-13 VITALS — BP 129/78 | HR 72 | Temp 98.6°F | Ht 65.35 in | Wt 264.2 lb

## 2022-09-13 DIAGNOSIS — I10 Essential (primary) hypertension: Secondary | ICD-10-CM

## 2022-09-13 DIAGNOSIS — R7303 Prediabetes: Secondary | ICD-10-CM | POA: Diagnosis not present

## 2022-09-13 DIAGNOSIS — Z0000 Encounter for general adult medical examination without abnormal findings: Secondary | ICD-10-CM | POA: Diagnosis not present

## 2022-09-13 DIAGNOSIS — N1831 Chronic kidney disease, stage 3a: Secondary | ICD-10-CM | POA: Diagnosis not present

## 2022-09-13 DIAGNOSIS — E782 Mixed hyperlipidemia: Secondary | ICD-10-CM | POA: Diagnosis not present

## 2022-09-13 DIAGNOSIS — E785 Hyperlipidemia, unspecified: Secondary | ICD-10-CM

## 2022-09-13 DIAGNOSIS — E538 Deficiency of other specified B group vitamins: Secondary | ICD-10-CM

## 2022-09-13 LAB — COMPREHENSIVE METABOLIC PANEL
ALT: 45 U/L — ABNORMAL HIGH (ref 0–35)
AST: 32 U/L (ref 0–37)
Albumin: 4.6 g/dL (ref 3.5–5.2)
Alkaline Phosphatase: 56 U/L (ref 39–117)
BUN: 13 mg/dL (ref 6–23)
CO2: 28 mEq/L (ref 19–32)
Calcium: 9.4 mg/dL (ref 8.4–10.5)
Chloride: 99 mEq/L (ref 96–112)
Creatinine, Ser: 0.95 mg/dL (ref 0.40–1.20)
GFR: 70.56 mL/min (ref >60.00)
Glucose, Bld: 121 mg/dL — ABNORMAL HIGH (ref 70–99)
Potassium: 3.9 mEq/L (ref 3.5–5.1)
Sodium: 137 mEq/L (ref 135–145)
Total Bilirubin: 0.7 mg/dL (ref 0.2–1.2)
Total Protein: 7 g/dL (ref 6.0–8.3)

## 2022-09-13 LAB — CBC
HCT: 38.4 % (ref 36.0–46.0)
Hemoglobin: 12.6 g/dL (ref 12.0–15.0)
MCHC: 33 g/dL (ref 30.0–36.0)
MCV: 95 fl (ref 78.0–100.0)
Platelets: 354 10*3/uL (ref 150.0–400.0)
RBC: 4.04 Mil/uL (ref 3.87–5.11)
RDW: 13.1 % (ref 11.5–15.5)
WBC: 6.1 10*3/uL (ref 4.0–10.5)

## 2022-09-13 LAB — LIPID PANEL
Cholesterol: 208 mg/dL — ABNORMAL HIGH (ref 0–200)
HDL: 63.7 mg/dL (ref >39.00)
LDL Cholesterol: 124 mg/dL — ABNORMAL HIGH (ref 0–99)
NonHDL: 144.16
Total CHOL/HDL Ratio: 3
Triglycerides: 102 mg/dL (ref 0.0–149.0)
VLDL: 20.4 mg/dL (ref 0.0–40.0)

## 2022-09-13 LAB — TSH: TSH: 2.37 u[IU]/mL (ref 0.35–5.50)

## 2022-09-13 LAB — HEMOGLOBIN A1C: Hgb A1c MFr Bld: 6.1 % (ref 4.6–6.5)

## 2022-09-13 LAB — VITAMIN B12: Vitamin B-12: 1085 pg/mL — ABNORMAL HIGH (ref 211–911)

## 2022-09-13 MED ORDER — PROPRANOLOL HCL ER 120 MG PO CP24: 120.0000 mg | ORAL_CAPSULE | Freq: Every day | ORAL | 1 refills | Status: DC

## 2022-09-13 MED ORDER — AMLODIPINE BESYLATE 10 MG PO TABS: 10.0000 mg | ORAL_TABLET | Freq: Every day | ORAL | 1 refills | Status: DC

## 2022-09-13 MED ORDER — FUROSEMIDE 20 MG PO TABS: 20.0000 mg | ORAL_TABLET | Freq: Every day | ORAL | 3 refills | Status: DC | PRN

## 2022-09-13 MED ORDER — ATORVASTATIN CALCIUM 80 MG PO TABS: 80.0000 mg | ORAL_TABLET | Freq: Every day | ORAL | 3 refills | Status: DC

## 2022-09-13 MED ORDER — FENOFIBRATE 145 MG PO TABS: 145.0000 mg | ORAL_TABLET | Freq: Every day | ORAL | 1 refills | Status: DC

## 2022-09-13 NOTE — Patient Instructions (Addendum)
Return in about 24 weeks (around 02/28/2023) for Routine chronic condition follow-up.        Great to see you today.  I have refilled the medication(s) we provide.   If labs were collected, we will inform you of lab results once received either by echart message or telephone call.   - echart message- for normal results that have been seen by the patient already.   - telephone call: abnormal results or if patient has not viewed results in their echart. Health Maintenance, Female Adopting a healthy lifestyle and getting preventive care are important in promoting health and wellness. Ask your health care provider about: The right schedule for you to have regular tests and exams. Things you can do on your own to prevent diseases and keep yourself healthy. What should I know about diet, weight, and exercise? Eat a healthy diet  Eat a diet that includes plenty of vegetables, fruits, low-fat dairy products, and lean protein. Do not eat a lot of foods that are high in solid fats, added sugars, or sodium. Maintain a healthy weight Body mass index (BMI) is used to identify weight problems. It estimates body fat based on height and weight. Your health care provider can help determine your BMI and help you achieve or maintain a healthy weight. Get regular exercise Get regular exercise. This is one of the most important things you can do for your health. Most adults should: Exercise for at least 150 minutes each week. The exercise should increase your heart rate and make you sweat (moderate-intensity exercise). Do strengthening exercises at least twice a week. This is in addition to the moderate-intensity exercise. Spend less time sitting. Even light physical activity can be beneficial. Watch cholesterol and blood lipids Have your blood tested for lipids and cholesterol at 50 years of age, then have this test every 5 years. Have your cholesterol levels checked more often if: Your lipid or  cholesterol levels are high. You are older than 50 years of age. You are at high risk for heart disease. What should I know about cancer screening? Depending on your health history and family history, you may need to have cancer screening at various ages. This may include screening for: Breast cancer. Cervical cancer. Colorectal cancer. Skin cancer. Lung cancer. What should I know about heart disease, diabetes, and high blood pressure? Blood pressure and heart disease High blood pressure causes heart disease and increases the risk of stroke. This is more likely to develop in people who have high blood pressure readings or are overweight. Have your blood pressure checked: Every 3-5 years if you are 80-68 years of age. Every year if you are 12 years old or older. Diabetes Have regular diabetes screenings. This checks your fasting blood sugar level. Have the screening done: Once every three years after age 57 if you are at a normal weight and have a low risk for diabetes. More often and at a younger age if you are overweight or have a high risk for diabetes. What should I know about preventing infection? Hepatitis B If you have a higher risk for hepatitis B, you should be screened for this virus. Talk with your health care provider to find out if you are at risk for hepatitis B infection. Hepatitis C Testing is recommended for: Everyone born from [birth year] through 1963. Anyone with known risk factors for hepatitis C. Sexually transmitted infections (STIs) Get screened for STIs, including gonorrhea and chlamydia, if: You are sexually active and are younger  than 50 years of age. You are older than 50 years of age and your health care provider tells you that you are at risk for this type of infection. Your sexual activity has changed since you were last screened, and you are at increased risk for chlamydia or gonorrhea. Ask your health care provider if you are at risk. Ask your health care  provider about whether you are at high risk for HIV. Your health care provider may recommend a prescription medicine to help prevent HIV infection. If you choose to take medicine to prevent HIV, you should first get tested for HIV. You should then be tested every 3 months for as long as you are taking the medicine. Pregnancy If you are about to stop having your period (premenopausal) and you may become pregnant, seek counseling before you get pregnant. Take 400 to 800 micrograms (mcg) of folic acid every day if you become pregnant. Ask for birth control (contraception) if you want to prevent pregnancy. Osteoporosis and menopause Osteoporosis is a disease in which the bones lose minerals and strength with aging. This can result in bone fractures. If you are 9 years old or older, or if you are at risk for osteoporosis and fractures, ask your health care provider if you should: Be screened for bone loss. Take a calcium or vitamin D supplement to lower your risk of fractures. Be given hormone replacement therapy (HRT) to treat symptoms of menopause. Follow these instructions at home: Alcohol use Do not drink alcohol if: Your health care provider tells you not to drink. You are pregnant, may be pregnant, or are planning to become pregnant. If you drink alcohol: Limit how much you have to: 0-1 drink a day. Know how much alcohol is in your drink. In the [place] one drink equals one 12 oz bottle of beer (355 mL), one 5 oz glass of wine (148 mL), or one 1 oz glass of hard liquor (44 mL). Lifestyle Do not use any products that contain nicotine or tobacco. These products include cigarettes, chewing tobacco, and vaping devices, such as e-cigarettes. If you need help quitting, ask your health care provider. Do not use street drugs. Do not share needles. Ask your health care provider for help if you need support or information about quitting drugs. General instructions Schedule regular health, dental, and  eye exams. Stay current with your vaccines. Tell your health care provider if: You often feel depressed. You have ever been abused or do not feel safe at home. Summary Adopting a healthy lifestyle and getting preventive care are important in promoting health and wellness. Follow your health care provider's instructions about healthy diet, exercising, and getting tested or screened for diseases. Follow your health care provider's instructions on monitoring your cholesterol and blood pressure. This information is not intended to replace advice given to you by your health care provider. Make sure you discuss any questions you have with your health care provider. Document Revised: 01/08/2021 Document Reviewed: 01/08/2021 Elsevier Patient Education  [employer].

## 2022-09-13 NOTE — Progress Notes (Signed)
Patient ID: ENOLIA KOEPKE, female  DOB: [birth year], 52 y.o.   MRN: [id number] Patient Care Team    Relationship Specialty Notifications Start End  Ma Hillock, DO PCP - General Family Medicine  05/01/15   Dan Humphreys  Nurse Practitioner  12/26/15   Molli Posey, MD Consulting Physician Obstetrics and Gynecology  09/13/16   Nevada Crane, MD Consulting Physician Psychiatry  12/21/19   Armbruster, Carlota Raspberry, MD Consulting Physician Gastroenterology  08/22/21     Chief Complaint  Patient presents with   Annual Exam    Subjective: CARRIEANNE KLEEN is a 52 y.o.  Female  present for CPE . All past medical history, surgical history, allergies, family history, immunizations, medications and social history were updated in the electronic medical record today. All recent labs, ED visits and hospitalizations within the last year were reviewed.  Health maintenance:  Colonoscopy: PGF colon cancer history (80). Screen completed 12/2020 - 3 yr followup for polyps. Dr. Havery Moros Mammogram: 12/2021, normal, has completed at OB/GYN office. Dr. Matthew Saras.  Cervical cancer screening: 12/2021- GYN Immunizations: tdap 09/2016 UTD, Influenza UTD 2023 (encouraged yearly), covid series completed.  Infectious disease screening: HIV completed and hep c completed DEXA: routine screen at 62 Hospitalizations/ED visits: reviewed  Hypertension/Hyperlipidemia/LE edema:   She reports compliance with her Inderal LA 120, Lipitor, tricor and amlodipine 10.  She has started the lasix 20 mg QD PRN-not taking often, with only mild improvement in LE edema.  Patient denies chest pain, shortness of breath, dizziness or lower extremity edema.  Pt is  prescribed statin and tricor,  taking fish oil 3600 mg daily.  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  Diet: low salt  Exercise: exercising routinely RF: HLD, stable coiled aneurysm, HTN, Fhx        09/13/2022    8:40 AM 02/12/2022    8:17 AM 11/21/2021    2:47 PM 08/22/2021    1:48 PM 06/21/2020    8:10 AM  Depression screen PHQ 2/9  Decreased Interest '1 1 1 1 '$ 0  Down, Depressed, Hopeless '1 1 1 2 '$ 0  PHQ - 2 Score '2 2 2 3 '$ 0  Altered sleeping 1 0  1   Tired, decreased energy '1 1  1   '$ Change in appetite '1 1  2   '$ Feeling bad or failure about yourself  '1 1  1   '$ Trouble concentrating 0 0  0   Moving slowly or fidgety/restless 0 0  0   Suicidal thoughts 0 0  1   PHQ-9 Score '6 5  9       '$ 09/13/2022    8:41 AM 02/12/2022    8:20 AM 08/22/2021    1:48 PM 03/19/2019    8:41 AM  GAD 7 : Generalized Anxiety Score  Nervous, Anxious, on Edge '1 2 2 2  '$ Control/stop worrying '1 1 1 3  '$ Worry too much - different things '1 2 1 3  '$ Trouble relaxing '1 1 1 1  '$ Restless 0 0 0 1  Easily annoyed or irritable 0 0 0 1  Afraid - awful might happen '1 1 1 1  '$ Total GAD 7 Score '5 7 6 12  '$ Anxiety Difficulty    Somewhat difficult    Immunization History  Administered Date(s) Administered   Influenza Split 05/18/2011   Influenza,inj,Quad PF,6+ Mos 07/09/2013, 05/04/2014, 05/11/2015, 05/29/2018, 06/14/2022   Influenza-Unspecified 06/20/2017, 05/14/2019,  05/12/2020, 05/11/2021   PFIZER(Purple Top)SARS-COV-2 Vaccination 11/29/2019, 12/24/2019, 08/04/2020   Td 09/02/2005   Tdap 09/13/2016     Past Medical History:  Diagnosis Date   Anxiety    takes Clonazepam daily as needed   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 Toy Care, [place] 03/2022 per pt   Fibroids 2023   GERD (gastroesophageal reflux disease)    takes Nexium daily   Headache(784.0)    chronic migraine, follows with [facility], Ashok Pall, NP, [place] 07/06/21 as of 06/03/22.   Hyperlipidemia    takes Atorvastatin daily, follows with PCP  Dr. Howard Pouch.   Hypertension    takes Amlodipine and Propranolol daily, follows w/ PCP Dr. Howard Pouch, [place] 08/22/21 for preventative health.   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 Howard Pouch, takes Lasix prn   Parotiditis 11/16/2021   PONV (postoperative nausea and vomiting)    Pre-diabetes 02/12/2022   Hgb A1C 5.9   Precancerous skin lesion    left under arm   SAH (subarachnoid hemorrhage) ([place]) 2014   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   Sleep apnea    MILD, 12/22/15 split night study in Epic   Vitamin B12 deficiency    takes supplement   Wears contact lenses    Wears glasses    Allergies  Allergen Reactions   Ace Inhibitors Swelling and Other (See Comments)    Angioedema    Morphine Itching   Past Surgical History:  Procedure Laterality Date   BRAIN SURGERY  01/2013   aneurysm coiling   COLONOSCOPY  01/16/2021   multiple polyps   DILATION AND CURETTAGE OF UTERUS  2009   HYSTERECTOMY ABDOMINAL WITH SALPINGO-OOPHORECTOMY Left 06/24/2022   Procedure: TOTAL ABDOMINAL HYSTERECTOMY, LEFT SALPINGO-OOPHORECTOMY, RIGHT SALPINGECTOMY;  Surgeon: Molli Posey, MD;  Location: [facility];  Service: Gynecology;  Laterality: Left;   IR RADIOLOGIST EVAL & MGMT  12/05/2020   LAPAROTOMY  2004   ovarian cyst   MYOMECTOMY  05/16/2011   Procedure: MYOMECTOMY;  Surgeon: Margarette Asal;  Location: [facility];  Service: Gynecology;  Laterality: N/A;  abdominal   RADIOLOGY WITH ANESTHESIA N/A 02/18/2013   Procedure: RADIOLOGY WITH ANESTHESIA;  Surgeon: Rob Hickman, MD;  Location: [facility];  Service: Radiology;  Laterality: N/A;   RADIOLOGY WITH ANESTHESIA N/A 01/26/2014   Procedure: RADIOLOGY WITH ANESTHESIA;  Surgeon: Rob Hickman, MD;  Location: [facility];  Service: Radiology;  Laterality: N/A;   RADIOLOGY WITH  ANESTHESIA N/A 07/25/2014   Procedure: RADIOLOGY WITH ANESTHESIA;  Surgeon: Rob Hickman, MD;  Location: [facility];  Service: Radiology;  Laterality: N/A;   Family History  Problem Relation Age of Onset   Heart disease Mother 31       cad   Lupus Mother    COPD Mother    Arthritis Mother        rheumatoid   AVM Mother    Diabetes Mother    Bipolar disorder Mother    Drug abuse Mother    Fibromyalgia Mother    Colon polyps Mother  Diabetes Father    Cancer Father        thyroid, lung   Depression Sister    Diabetes Sister    Hyperlipidemia Sister    Hypertension Sister    Cancer Paternal Grandfather        colon   Colon cancer Paternal Grandfather    Alcohol abuse Paternal Uncle    Drug abuse Maternal Grandfather    Alcohol abuse Maternal Grandfather    Esophageal cancer Neg Hx    Stomach cancer Neg Hx    Rectal cancer Neg Hx    Social History   Social History Narrative   Lives with husband   Caffeine- 2-12 oz cans daily    Allergies as of 09/13/2022       Reactions   Ace Inhibitors Swelling, Other (See Comments)   Angioedema    Morphine Itching        Medication List        Accurate as of September 13, 2022  1:12 PM. If you have any questions, ask your nurse or doctor.          STOP taking these medications    HYDROcodone-acetaminophen 5-325 MG tablet Commonly known as: NORCO/VICODIN Stopped by: Howard Pouch, DO       TAKE these medications    amLODipine 10 MG tablet Commonly known as: NORVASC Take 1 tablet (10 mg total) by mouth daily.   atorvastatin 80 MG tablet Commonly known as: LIPITOR Take 1 tablet (80 mg total) by mouth daily.   clonazePAM 0.5 MG tablet Commonly known as: KLONOPIN Take 0.5-1 mg by mouth 2 (two) times daily. 0.'5mg'$  in the mornnig, '1mg'$  in the evening   DULoxetine 60 MG capsule Commonly known as: CYMBALTA Take 120 mg by mouth daily.   Eszopiclone 3 MG Tabs Take 3 mg by mouth at bedtime.   fenofibrate 145  MG tablet Commonly known as: TRICOR Take 1 tablet (145 mg total) by mouth daily.   fexofenadine 60 MG tablet Commonly known as: ALLEGRA Take 60 mg by mouth daily.   FISH OIL PO Take 1 capsule by mouth daily.   Fluocinolone Acetonide Scalp 0.01 % Oil Apply topically as needed.   furosemide 20 MG tablet Commonly known as: LASIX Take 1 tablet (20 mg total) by mouth daily as needed.   ibuprofen 200 MG tablet Commonly known as: Advil Take 4 tablets (800 mg total) by mouth every 8 (eight) hours as needed for moderate pain.   ketoconazole 2 % shampoo Commonly known as: NIZORAL Apply topically once a week.   multivitamin with minerals Tabs tablet Take 1 tablet by mouth daily.   NEXIUM PO Take by mouth.   prochlorperazine 10 MG tablet Commonly known as: COMPAZINE Take 1 tablet by mouth 3 (three) times daily as needed.   propranolol ER 120 MG 24 hr capsule Commonly known as: INDERAL LA Take 1 capsule (120 mg total) by mouth daily.   QUEtiapine Fumarate 150 MG 24 hr tablet Commonly known as: SEROQUEL XR Take 150 mg by mouth at bedtime.   triamcinolone cream 0.1 % Commonly known as: KENALOG Apply topically 2 (two) times daily.   vitamin B-12 100 MCG tablet Commonly known as: CYANOCOBALAMIN Take 100 mcg by mouth daily. Pt unsure of dosage.        All past medical history, surgical history, allergies, family history, immunizations andmedications were updated in the EMR today and reviewed under the history and medication portions of their EMR.  No results found.   ROS 14 pt review of systems performed and negative (unless mentioned in an HPI)  Objective: BP 129/78   Pulse 72   Temp 98.6 F (37 C)   Ht 5' 5.35" (1.66 m)   Wt 264 lb 3.2 oz (119.8 kg)   LMP 06/09/2019 (Approximate)   SpO2 96%   BMI 43.49 kg/m  Physical Exam Vitals and nursing note reviewed.  Constitutional:      General: She is not in acute distress.    Appearance: Normal  appearance. She is not ill-appearing or toxic-appearing.  HENT:     Head: Normocephalic and atraumatic.     Right Ear: Tympanic membrane, ear canal and external ear normal. There is no impacted cerumen.     Left Ear: Tympanic membrane, ear canal and external ear normal. There is no impacted cerumen.     Nose: No congestion or rhinorrhea.     Mouth/Throat:     Mouth: Mucous membranes are moist.     Pharynx: Oropharynx is clear. No oropharyngeal exudate or posterior oropharyngeal erythema.  Eyes:     General: No scleral icterus.       Right eye: No discharge.        Left eye: No discharge.     Extraocular Movements: Extraocular movements intact.     Conjunctiva/sclera: Conjunctivae normal.     Pupils: Pupils are equal, round, and reactive to light.  Cardiovascular:     Rate and Rhythm: Normal rate and regular rhythm.     Pulses: Normal pulses.     Heart sounds: Normal heart sounds. No murmur heard.    No friction rub. No gallop.  Pulmonary:     Effort: Pulmonary effort is normal. No respiratory distress.     Breath sounds: Normal breath sounds. No stridor. No wheezing, rhonchi or rales.  Chest:     Chest wall: No tenderness.  Abdominal:     General: Abdomen is flat. Bowel sounds are normal. There is no distension.     Palpations: Abdomen is soft. There is no mass.     Tenderness: There is no abdominal tenderness. There is no right CVA tenderness, left CVA tenderness, guarding or rebound.     Hernia: No hernia is present.  Musculoskeletal:        General: No swelling, tenderness or deformity. Normal range of motion.     Cervical back: Normal range of motion and neck supple. No rigidity or tenderness.     Right lower leg: No edema.     Left lower leg: No edema.  Lymphadenopathy:     Cervical: No cervical adenopathy.  Skin:    General: Skin is warm and dry.     Coloration: Skin is not jaundiced or pale.     Findings: No bruising, erythema, lesion or rash.  Neurological:      General: No focal deficit present.     Mental Status: She is alert and oriented to person, place, and time. Mental status is at baseline.     Cranial Nerves: No cranial nerve deficit.     Sensory: No sensory deficit.     Motor: No weakness.     Coordination: Coordination normal.     Gait: Gait normal.     Deep Tendon Reflexes: Reflexes normal.  Psychiatric:        Mood and Affect: Mood normal.        Behavior: Behavior normal.        Thought Content: Thought content  normal.        Judgment: Judgment normal.      No results found.  Assessment/plan: ANICA ALCARAZ is a 52 y.o. female present for CPE/CMC Essential hypertension/HLD/morbid obesity Anterior cerebral artery aneurysm/extremity edema Stable  continue amlodipine 10  Continue propranolol 120 mg -Low-sodium diet. Diet and exercise modifications discussed today.  -Continue Lipitor 80 mg daily -Continue Lasix 20 mg daily as needed. Rarely needs.  -Use compression stockings.  Weight loss can also be helpful with lower extremity edema - make certain fish oil supplement is 3-4g daily. - continue fenofibrate - continue follow ups with IR for aneurysm> stable.  CBC, CMP, TSH and lipids collected today F/u 5.5 mos   Prediabetes Has been working on diet - POCT HgB A1C 6.0> 5.3 >5.8>5.9>6.3> collected today Depression with anxiety Managed by psychiatry team Stage 3a chronic kidney disease ([place]) Continues to improve over time since lithium was stopped.  Continue to monitor yearly CMP   Prediabetes - Hemoglobin A1c   Routine general medical examination at a health care facility Colonoscopy: PGF colon cancer history (80). Screen completed 12/2020 - 3 yr followup for polyps. Dr. Havery Moros Mammogram: 12/2021, normal, has completed at OB/GYN office. Dr. Matthew Saras.  Cervical cancer screening: 12/2021- GYN Immunizations: tdap 09/2016 UTD, Influenza UTD 2023 (encouraged yearly), covid series completed.  Infectious disease  screening: HIV completed and hep c completed DEXA: routine screen at 62 Patient was encouraged to exercise greater than 150 minutes a week. Patient was encouraged to choose a diet filled with fresh fruits and vegetables, and lean meats. AVS provided to patient today for education/recommendation on gender specific health and safety maintenance.   Return in about 24 weeks (around 02/28/2023) for Routine chronic condition follow-up.  Orders Placed This Encounter  Procedures   Comprehensive metabolic panel   CBC   Hemoglobin A1c   Lipid panel   TSH   Vitamin B12   Meds ordered this encounter  Medications   amLODipine (NORVASC) 10 MG tablet    Sig: Take 1 tablet (10 mg total) by mouth daily.    Dispense:  90 tablet    Refill:  1   atorvastatin (LIPITOR) 80 MG tablet    Sig: Take 1 tablet (80 mg total) by mouth daily.    Dispense:  90 tablet    Refill:  3   fenofibrate (TRICOR) 145 MG tablet    Sig: Take 1 tablet (145 mg total) by mouth daily.    Dispense:  90 tablet    Refill:  1   furosemide (LASIX) 20 MG tablet    Sig: Take 1 tablet (20 mg total) by mouth daily as needed.    Dispense:  30 tablet    Refill:  3   propranolol ER (INDERAL LA) 120 MG 24 hr capsule    Sig: Take 1 capsule (120 mg total) by mouth daily.    Dispense:  90 capsule    Refill:  1   Referral Orders  No referral(s) requested today     Electronically signed by: Howard Pouch, [place]

## 2022-11-27 ENCOUNTER — Telehealth: Payer: Self-pay

## 2022-11-27 ENCOUNTER — Ambulatory Visit (INDEPENDENT_AMBULATORY_CARE_PROVIDER_SITE_OTHER): Payer: Commercial Managed Care - PPO | Admitting: Family Medicine

## 2022-11-27 VITALS — BP 140/84 | HR 85 | Wt 274.8 lb

## 2022-11-27 DIAGNOSIS — R7303 Prediabetes: Secondary | ICD-10-CM

## 2022-11-27 DIAGNOSIS — E8881 Metabolic syndrome: Secondary | ICD-10-CM

## 2022-11-27 MED ORDER — ZEPBOUND 2.5 MG/0.5ML ~~LOC~~ SOAJ: 2.5000 mg | SUBCUTANEOUS | 0 refills | Status: DC

## 2022-11-27 NOTE — Patient Instructions (Addendum)
Return in about 4 weeks (around 12/25/2022).        Great to see you today.  I have refilled the medication(s) we provide.   If labs were collected, we will inform you of lab results once received either by echart message or telephone call.   - echart message- for normal results that have been seen by the patient already.   - telephone call: abnormal results or if patient has not viewed results in their echart.    2 weeks food log Weekly net calorie calculator.   Applications for calorie counting.   glycemic index. exercise goal of 150 minutes a week (plus warm up and cool down) of cardiovascular exercise.   Patient was encouraged to maintain adequate water consumption of at least 100 ounces a day

## 2022-11-27 NOTE — Progress Notes (Signed)
[name] , 1970-09-22, 52 y.o., female MRN: [id number] Patient Care Team    Relationship Specialty Notifications Start End  Ma Hillock, DO PCP - General Family Medicine  05/01/15   Dan Humphreys  Nurse Practitioner  12/26/15   Molli Posey, MD Consulting Physician Obstetrics and Gynecology  09/13/16   Nevada Crane, MD Consulting Physician Psychiatry  12/21/19   Armbruster, Carlota Raspberry, MD Consulting Physician Gastroenterology  08/22/21     Chief Complaint  Patient presents with   Weight Loss    Discuss weight lost options.     Subjective: [name] is a 52 y.o. Pt presents for an OV to discuss prediabetes and morbid obesity.   Weight: 275 lbs BMI: 45.2 Patient reports she has not been watching her diet or exercising routinely. She is ready to get started on a lifestyle change/plan. A1c was 6.3 and FBG was elevated in diabetic range x1, HTN, HLD with higher risk mental health medications that cause weight gain as side effects.      09/13/2022    8:40 AM 02/12/2022    8:17 AM 11/21/2021    2:47 PM 08/22/2021    1:48 PM 06/21/2020    8:10 AM  Depression screen PHQ 2/9  Decreased Interest 1 1 1 1  0  Down, Depressed, Hopeless 1 1 1 2  0  PHQ - 2 Score 2 2 2 3  0  Altered sleeping 1 0  1   Tired, decreased energy 1 1  1    Change in appetite 1 1  2    Feeling bad or failure about yourself  1 1  1    Trouble concentrating 0 0  0   Moving slowly or fidgety/restless 0 0  0   Suicidal thoughts 0 0  1   PHQ-9 Score 6 5  9      Allergies  Allergen Reactions   Ace Inhibitors Swelling and Other (See Comments)    Angioedema    Morphine Itching   Social History   Social History Narrative   Lives with husband   Caffeine- 2-12 oz cans daily   Past Medical History:  Diagnosis Date   Anxiety    takes Clonazepam daily as needed   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 Toy Care, [place] 03/2022 per pt   Fibroids 2023   GERD (gastroesophageal reflux disease)    takes Nexium daily   Headache(784.0)    chronic migraine, follows with [facility], Ashok Pall, NP, [place] 07/06/21 as of 06/03/22.   Hyperlipidemia    takes Atorvastatin daily, follows with PCP Dr. Howard Pouch.   Hypertension    takes Amlodipine and Propranolol daily, follows w/ PCP Dr. Howard Pouch, [place] 08/22/21 for preventative health.   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 Howard Pouch, takes Lasix prn   Parotiditis 11/16/2021   PONV (postoperative nausea and vomiting)    Pre-diabetes 02/12/2022   Hgb A1C 5.9   Precancerous skin lesion    left under arm   SAH (subarachnoid hemorrhage) ([place]) 2014   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, later had additional coiling  and a stent placed, per pt.   Sleep apnea    MILD, 12/22/15 split night study in Epic   Vitamin B12 deficiency    takes supplement   Wears contact lenses    Wears glasses    Past Surgical History:  Procedure Laterality Date   BRAIN SURGERY  01/2013   aneurysm coiling   COLONOSCOPY  01/16/2021   multiple polyps   DILATION AND CURETTAGE OF UTERUS  2009   HYSTERECTOMY ABDOMINAL WITH SALPINGO-OOPHORECTOMY Left 06/24/2022   Procedure: TOTAL ABDOMINAL HYSTERECTOMY, LEFT SALPINGO-OOPHORECTOMY, RIGHT SALPINGECTOMY;  Surgeon: Molli Posey, MD;  Location: [facility];  Service: Gynecology;  Laterality: Left;   IR RADIOLOGIST EVAL & MGMT  12/05/2020   LAPAROTOMY  2004   ovarian cyst   MYOMECTOMY  05/16/2011   Procedure: MYOMECTOMY;  Surgeon: Margarette Asal;  Location: [facility];  Service: Gynecology;  Laterality: N/A;  abdominal   RADIOLOGY WITH ANESTHESIA N/A 02/18/2013   Procedure: RADIOLOGY  WITH ANESTHESIA;  Surgeon: Rob Hickman, MD;  Location: [place];  Service: Radiology;  Laterality: N/A;   RADIOLOGY WITH ANESTHESIA N/A 01/26/2014   Procedure: RADIOLOGY WITH ANESTHESIA;  Surgeon: Rob Hickman, MD;  Location: [place];  Service: Radiology;  Laterality: N/A;   RADIOLOGY WITH ANESTHESIA N/A 07/25/2014   Procedure: RADIOLOGY WITH ANESTHESIA;  Surgeon: Rob Hickman, MD;  Location: [place];  Service: Radiology;  Laterality: N/A;   Family History  Problem Relation Age of Onset   Heart disease Mother 44       cad   Lupus Mother    COPD Mother    Arthritis Mother        rheumatoid   AVM Mother    Diabetes Mother    Bipolar disorder Mother    Drug abuse Mother    Fibromyalgia Mother    Colon polyps Mother    Diabetes Father    Cancer Father        thyroid, lung   Depression Sister    Diabetes Sister    Hyperlipidemia Sister    Hypertension Sister    Cancer Paternal Grandfather        colon   Colon cancer Paternal Grandfather    Alcohol abuse Paternal Uncle    Drug abuse Maternal Grandfather    Alcohol abuse Maternal Grandfather    Esophageal cancer Neg Hx    Stomach cancer Neg Hx    Rectal cancer Neg Hx    Allergies as of 11/27/2022       Reactions   Ace Inhibitors Swelling, Other (See Comments)   Angioedema    Morphine Itching        Medication List        Accurate as of November 27, 2022  9:53 AM. If you have any questions, ask your nurse or doctor.          amLODipine 10 MG tablet Commonly known as: NORVASC Take 1 tablet (10 mg total) by mouth daily.   atorvastatin 80 MG tablet Commonly known as: LIPITOR Take 1 tablet (80 mg total) by mouth daily.   clonazePAM 0.5 MG tablet Commonly known as: KLONOPIN Take 0.5-1 mg by mouth 2 (two) times daily. 0.5mg  in the mornnig, 1mg  in the evening   DULoxetine 60 MG capsule Commonly known as: CYMBALTA Take 120 mg by mouth daily.   Eszopiclone 3 MG Tabs Take 3 mg by mouth at bedtime.    fenofibrate 145 MG tablet Commonly known as: TRICOR Take  1 tablet (145 mg total) by mouth daily.   fexofenadine 60 MG tablet Commonly known as: ALLEGRA Take 60 mg by mouth daily.   FISH OIL PO Take 1 capsule by mouth daily.   Fluocinolone Acetonide Scalp 0.01 % Oil Apply topically as needed.   furosemide 20 MG tablet Commonly known as: LASIX Take 1 tablet (20 mg total) by mouth daily as needed.   ibuprofen 200 MG tablet Commonly known as: Advil Take 4 tablets (800 mg total) by mouth every 8 (eight) hours as needed for moderate pain.   ketoconazole 2 % shampoo Commonly known as: NIZORAL Apply topically once a week.   multivitamin with minerals Tabs tablet Take 1 tablet by mouth daily.   NEXIUM PO Take by mouth.   prochlorperazine 10 MG tablet Commonly known as: COMPAZINE Take 1 tablet by mouth 3 (three) times daily as needed.   propranolol ER 120 MG 24 hr capsule Commonly known as: INDERAL LA Take 1 capsule (120 mg total) by mouth daily.   QUEtiapine Fumarate 150 MG 24 hr tablet Commonly known as: SEROQUEL XR Take 150 mg by mouth at bedtime.   traZODone 50 MG tablet Commonly known as: DESYREL Take 100 mg by mouth at bedtime. Takes 2   triamcinolone cream 0.1 % Commonly known as: KENALOG Apply topically 2 (two) times daily.   vitamin B-12 100 MCG tablet Commonly known as: CYANOCOBALAMIN Take 100 mcg by mouth daily. Pt unsure of dosage.   Zepbound 2.5 MG/0.5ML Pen Generic drug: tirzepatide Inject 2.5 mg into the skin once a week.        All past medical history, surgical history, allergies, family history, immunizations andmedications were updated in the EMR today and reviewed under the history and medication portions of their EMR.     ROS Negative, with the exception of above mentioned in HPI   Objective:  BP (!) 140/84   Pulse 85   Wt 274 lb 12.8 oz (124.6 kg)   LMP 06/09/2019 (Approximate)   SpO2 97%   BMI 45.24 kg/m  Body mass index is  45.24 kg/m. Physical Exam Vitals and nursing note reviewed.  Constitutional:      General: She is not in acute distress.    Appearance: Normal appearance. She is normal weight. She is not ill-appearing or toxic-appearing.  HENT:     Head: Normocephalic and atraumatic.  Eyes:     General: No scleral icterus.       Right eye: No discharge.        Left eye: No discharge.     Extraocular Movements: Extraocular movements intact.     Conjunctiva/sclera: Conjunctivae normal.     Pupils: Pupils are equal, round, and reactive to light.  Skin:    Findings: No rash.  Neurological:     Mental Status: She is alert and oriented to person, place, and time. Mental status is at baseline.     Motor: No weakness.     Coordination: Coordination normal.     Gait: Gait normal.  Psychiatric:        Mood and Affect: Mood normal.        Behavior: Behavior normal.        Thought Content: Thought content normal.        Judgment: Judgment normal.    No results found. No results found. No results found for this or any previous visit (from the past 24 hour(s)).  Assessment/Plan: [name] is a 51 y.o. female present for  OV for  Morbid obesity (HCC)/Metabolic syndrome/HTN/Prediabetes Patient was counseled on exercise, calorie counting, weight loss and potential medications to help with weight loss today. -Patient was provided with online resources for: Weekly net calorie calculator.  Applications for calorie counting.  Patient was advised to ensure she is taking in adequate nutrition daily by meeting calorie goals. -Patient was educated on dietary changes to not only lose weight but to eat healthy.  Patient was educated on glycemic index. -Patient was educated on exercise goal of 150 minutes a week (plus warm up and cool down) of cardiovascular exercise.  Patient was educated on heart rate for cardiovascular and fat burning zones. -Patient was encouraged to maintain adequate water consumption of  at least 120 ounces a day, more if exercising/sweating. Zepbound is preferred level 1> start with nurse visit for proper technique.  F/u provider in 4 weeks with food log  Reviewed expectations re: course of current medical issues. Discussed self-management of symptoms. Outlined signs and symptoms indicating need for more acute intervention. Patient verbalized understanding and all questions were answered. Patient received an After-Visit Summary.    No orders of the defined types were placed in this encounter.  Meds ordered this encounter  Medications   tirzepatide (ZEPBOUND) 2.5 MG/0.5ML Pen    Sig: Inject 2.5 mg into the skin once a week.    Dispense:  2 mL    Refill:  0   Referral Orders  No referral(s) requested today     Note is dictated utilizing voice recognition software. Although note has been proof read prior to signing, occasional typographical errors still can be missed. If any questions arise, please do not hesitate to call for verification.   electronically signed by:  Howard Pouch, DO  [place]

## 2022-11-27 NOTE — Telephone Encounter (Addendum)
Received call from pts insurance stating pt will neeed a PA done for Zepbound 2.5mg . The site for PA is   [email or web address]   Not covermymed  ID # [id number]

## 2022-12-03 ENCOUNTER — Ambulatory Visit (INDEPENDENT_AMBULATORY_CARE_PROVIDER_SITE_OTHER): Payer: Commercial Managed Care - PPO | Admitting: Family Medicine

## 2022-12-03 VITALS — BP 117/77 | HR 77 | Temp 97.9°F | Wt 267.6 lb

## 2022-12-03 DIAGNOSIS — K529 Noninfective gastroenteritis and colitis, unspecified: Secondary | ICD-10-CM

## 2022-12-03 DIAGNOSIS — R195 Other fecal abnormalities: Secondary | ICD-10-CM

## 2022-12-03 DIAGNOSIS — R109 Unspecified abdominal pain: Secondary | ICD-10-CM | POA: Diagnosis not present

## 2022-12-03 LAB — CBC
HCT: 41.2 % (ref 36.0–46.0)
Hemoglobin: 13.9 g/dL (ref 12.0–15.0)
MCHC: 33.6 g/dL (ref 30.0–36.0)
MCV: 92.7 fl (ref 78.0–100.0)
Platelets: 324 10*3/uL (ref 150.0–400.0)
RBC: 4.44 Mil/uL (ref 3.87–5.11)
RDW: 12.8 % (ref 11.5–15.5)
WBC: 7.9 10*3/uL (ref 4.0–10.5)

## 2022-12-03 LAB — COMPREHENSIVE METABOLIC PANEL
ALT: 40 U/L — ABNORMAL HIGH (ref 0–35)
AST: 33 U/L (ref 0–37)
Albumin: 4.6 g/dL (ref 3.5–5.2)
Alkaline Phosphatase: 69 U/L (ref 39–117)
BUN: 15 mg/dL (ref 6–23)
CO2: 29 mEq/L (ref 19–32)
Calcium: 10 mg/dL (ref 8.4–10.5)
Chloride: 94 mEq/L — ABNORMAL LOW (ref 96–112)
Creatinine, Ser: 1.02 mg/dL (ref 0.40–1.20)
GFR: 64.69 mL/min (ref >60.00)
Glucose, Bld: 94 mg/dL (ref 70–99)
Potassium: 3.5 mEq/L (ref 3.5–5.1)
Sodium: 132 mEq/L — ABNORMAL LOW (ref 135–145)
Total Bilirubin: 0.7 mg/dL (ref 0.2–1.2)
Total Protein: 7.3 g/dL (ref 6.0–8.3)

## 2022-12-03 LAB — LIPASE: Lipase: 31 U/L (ref 11.0–59.0)

## 2022-12-03 LAB — C-REACTIVE PROTEIN: CRP: <1 mg/dL (ref 0.5–20.0)

## 2022-12-03 MED ORDER — ONDANSETRON HCL 4 MG PO TABS: 4.0000 mg | ORAL_TABLET | Freq: Three times a day (TID) | ORAL | 0 refills | Status: DC | PRN

## 2022-12-03 NOTE — Patient Instructions (Addendum)
Return in about 2 weeks (around 12/17/2022), or if symptoms worsen or fail to improve.        Great to see you today.  I have refilled the medication(s) we provide.   If labs were collected, we will inform you of lab results once received either by echart message or telephone call.   - echart message- for normal results that have been seen by the patient already.   - telephone call: abnormal results or if patient has not viewed results in their echart.

## 2022-12-03 NOTE — Telephone Encounter (Signed)
Pt would like to know any update on PA

## 2022-12-03 NOTE — Progress Notes (Signed)
[name] , 01/03/1971, 52 y.o., 52 y.o., female MRN: [id number] Patient Care Team    Relationship Specialty Notifications Start End  Ma Hillock, DO PCP - General Family Medicine  05/01/15   Dan Humphreys  Nurse Practitioner  12/26/15   Molli Posey, MD Consulting Physician Obstetrics and Gynecology  09/13/16   [place] Crane, MD Consulting Physician Psychiatry  12/21/19   Armbruster, Carlota Raspberry, MD Consulting Physician Gastroenterology  08/22/21     Chief Complaint  Patient presents with   Abdominal Pain    Discoloration (gray,black, brown) stool; nausea; GERD     Subjective: [name] is a 52 y.o. Pt presents for an OV with complaints of diffuse abdominal discomfort of 5 days duration.  Associated symptoms include nausea, decreased appetite, reflux-like symptoms and discoloration of stools.  She reports she had dark beer the night prior to onset of symptoms and sometimes dark beer can cause her stomach upset, although usually not to this degree.  She has been exposed to the stomach virus at work, but not with a close contact. She reports she had more loose stools and took Pepto-Bismol for her stomach and stool, then she felt like she was not having a good bowel movement so she took MiraLAX as well.  Stools are still somewhat dark, but not as dark as prior.  She is tolerating p.o., but very nauseated.  She endorses having some mild chills yesterday, and then felt hot in the evening. She endorses mild dizziness occurring as well. She is taking over-the-counter Nexium     09/13/2022    8:40 AM 02/12/2022    8:17 AM 11/21/2021    2:47 PM 08/22/2021    1:48 PM 06/21/2020    8:10 AM  Depression screen PHQ 2/9  Decreased Interest 1 1 1 1  0  Down, Depressed, Hopeless 1 1 1 2  0  PHQ - 2 Score 2 2 2 3  0  Altered sleeping 1 0  1   Tired, decreased energy 1 1  1    Change in appetite 1 1  2    Feeling bad or failure about yourself  1 1  1    Trouble concentrating 0 0   0   Moving slowly or fidgety/restless 0 0  0   Suicidal thoughts 0 0  1   PHQ-9 Score 6 5  9      Allergies  Allergen Reactions   Ace Inhibitors Swelling and Other (See Comments)    Angioedema    Morphine Itching   Social History   Social History Narrative   Lives with husband   Caffeine- 2-12 oz cans daily   Past Medical History:  Diagnosis Date   Anxiety    takes Clonazepam daily as needed   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 Toy Care, [place] 03/2022 per pt   Fibroids 2023   GERD (gastroesophageal reflux disease)    takes Nexium daily   Headache(784.0)    chronic migraine, follows with [facility], Ashok Pall, NP, [place] 07/06/21 as of 06/03/22.   Hyperlipidemia    takes Atorvastatin daily, follows with PCP Dr. Howard Pouch.   Hypertension    takes Amlodipine and Propranolol daily, follows w/ PCP Dr. Howard Pouch, [place]  08/22/21 for preventative health.   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 Howard Pouch, takes Lasix prn   Parotiditis 11/16/2021   PONV (postoperative nausea and vomiting)    Pre-diabetes 02/12/2022   Hgb A1C 5.9   Precancerous skin lesion    left under arm   SAH (subarachnoid hemorrhage) ([place]) 2014   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   Sleep apnea    MILD, 12/22/15 split night study in Epic   Vitamin B12 deficiency    takes supplement   Wears contact lenses    Wears glasses    Past Surgical History:  Procedure Laterality Date   BRAIN SURGERY  01/2013   aneurysm coiling   COLONOSCOPY  01/16/2021   multiple polyps   DILATION AND CURETTAGE OF UTERUS  2009   HYSTERECTOMY ABDOMINAL WITH  SALPINGO-OOPHORECTOMY Left 06/24/2022   Procedure: TOTAL ABDOMINAL HYSTERECTOMY, LEFT SALPINGO-OOPHORECTOMY, RIGHT SALPINGECTOMY;  Surgeon: Molli Posey, MD;  Location: [facility];  Service: Gynecology;  Laterality: Left;   IR RADIOLOGIST EVAL & MGMT  12/05/2020   LAPAROTOMY  2004   ovarian cyst   MYOMECTOMY  05/16/2011   Procedure: MYOMECTOMY;  Surgeon: Margarette Asal;  Location: [facility];  Service: Gynecology;  Laterality: N/A;  abdominal   RADIOLOGY WITH ANESTHESIA N/A 02/18/2013   Procedure: RADIOLOGY WITH ANESTHESIA;  Surgeon: Rob Hickman, MD;  Location: [place];  Service: Radiology;  Laterality: N/A;   RADIOLOGY WITH ANESTHESIA N/A 01/26/2014   Procedure: RADIOLOGY WITH ANESTHESIA;  Surgeon: Rob Hickman, MD;  Location: [place];  Service: Radiology;  Laterality: N/A;   RADIOLOGY WITH ANESTHESIA N/A 07/25/2014   Procedure: RADIOLOGY WITH ANESTHESIA;  Surgeon: Rob Hickman, MD;  Location: [place];  Service: Radiology;  Laterality: N/A;   Family History  Problem Relation Age of Onset   Heart disease Mother 30       cad   Lupus Mother    COPD Mother    Arthritis Mother        rheumatoid   AVM Mother    Diabetes Mother    Bipolar disorder Mother    Drug abuse Mother    Fibromyalgia Mother    Colon polyps Mother    Diabetes Father    Cancer Father        thyroid, lung   Depression Sister    Diabetes Sister    Hyperlipidemia Sister    Hypertension Sister    Cancer Paternal Grandfather        colon   Colon cancer Paternal Grandfather    Alcohol abuse Paternal Uncle    Drug abuse Maternal Grandfather    Alcohol abuse Maternal Grandfather    Esophageal cancer Neg Hx    Stomach cancer Neg Hx    Rectal cancer Neg Hx    Allergies as of 12/03/2022       Reactions   Ace Inhibitors Swelling, Other (See Comments)   Angioedema    Morphine Itching        Medication List        Accurate as of December 03, 2022 12:51 PM. If you have any  questions, ask your nurse or doctor.          amLODipine 10 MG tablet Commonly known as: NORVASC Take 1 tablet (10 mg total) by  mouth daily.   atorvastatin 80 MG tablet Commonly known as: LIPITOR Take 1 tablet (80 mg total) by mouth daily.   clonazePAM 0.5 MG tablet Commonly known as: KLONOPIN Take 0.5-1 mg by mouth 2 (two) times daily. 0.5mg  in the mornnig, 1mg  in the evening   DULoxetine 60 MG capsule Commonly known as: CYMBALTA Take 120 mg by mouth daily.   Eszopiclone 3 MG Tabs Take 3 mg by mouth at bedtime.   fenofibrate 145 MG tablet Commonly known as: TRICOR Take 1 tablet (145 mg total) by mouth daily.   fexofenadine 60 MG tablet Commonly known as: ALLEGRA Take 60 mg by mouth daily.   FISH OIL PO Take 1 capsule by mouth daily.   Fluocinolone Acetonide Scalp 0.01 % Oil Apply topically as needed.   furosemide 20 MG tablet Commonly known as: LASIX Take 1 tablet (20 mg total) by mouth daily as needed.   ibuprofen 200 MG tablet Commonly known as: Advil Take 4 tablets (800 mg total) by mouth every 8 (eight) hours as needed for moderate pain.   ketoconazole 2 % shampoo Commonly known as: NIZORAL Apply topically once a week.   multivitamin with minerals Tabs tablet Take 1 tablet by mouth daily.   NEXIUM PO Take by mouth.   ondansetron 4 MG tablet Commonly known as: ZOFRAN Take 1 tablet (4 mg total) by mouth every 8 (eight) hours as needed for nausea or vomiting.   prochlorperazine 10 MG tablet Commonly known as: COMPAZINE Take 1 tablet by mouth 3 (three) times daily as needed.   propranolol ER 120 MG 24 hr capsule Commonly known as: INDERAL LA Take 1 capsule (120 mg total) by mouth daily.   QUEtiapine Fumarate 150 MG 24 hr tablet Commonly known as: SEROQUEL XR Take 150 mg by mouth at bedtime.   traZODone 50 MG tablet Commonly known as: DESYREL Take 100 mg by mouth at bedtime. Takes 2   triamcinolone cream 0.1 % Commonly known as:  KENALOG Apply topically 2 (two) times daily.   vitamin B-12 100 MCG tablet Commonly known as: CYANOCOBALAMIN Take 100 mcg by mouth daily. Pt unsure of dosage.   Zepbound 2.5 MG/0.5ML Pen Generic drug: tirzepatide Inject 2.5 mg into the skin once a week.        All past medical history, surgical history, allergies, family history, immunizations andmedications were updated in the EMR today and reviewed under the history and medication portions of their EMR.     ROS Negative, with the exception of above mentioned in HPI   Objective:  BP 117/77   Pulse 77   Temp 97.9 F (36.6 C)   Wt 267 lb 9.6 oz (121.4 kg)   LMP 06/09/2019 (Approximate)   SpO2 98%   BMI 44.05 kg/m  Body mass index is 44.05 kg/m. Physical Exam Vitals and nursing note reviewed.  Constitutional:      General: She is not in acute distress.    Appearance: Normal appearance. She is normal weight. She is not ill-appearing or toxic-appearing.  HENT:     Head: Normocephalic and atraumatic.  Eyes:     General: No scleral icterus.       Right eye: No discharge.        Left eye: No discharge.     Extraocular Movements: Extraocular movements intact.     Conjunctiva/sclera: Conjunctivae normal.     Pupils: Pupils are equal, round, and reactive to light.  Cardiovascular:     Rate and Rhythm: Normal rate  and regular rhythm.  Pulmonary:     Effort: Pulmonary effort is normal.  Abdominal:     General: Bowel sounds are normal. There is no distension.     Palpations: Abdomen is soft.     Tenderness: There is abdominal tenderness. There is no guarding or rebound.     Comments: Tenderness over left abdomen and epigastric  Skin:    Findings: No rash.  Neurological:     Mental Status: She is alert and oriented to person, place, and time. Mental status is at baseline.     Motor: No weakness.     Coordination: Coordination normal.     Gait: Gait normal.  Psychiatric:        Mood and Affect: Mood normal.         Behavior: Behavior normal.        Thought Content: Thought content normal.        Judgment: Judgment normal.     No results found. No results found. No results found for this or any previous visit (from the past 24 hour(s)).  Assessment/Plan: [name] is a 52 y.o. female present for OV for  Abnormal stool color Possibly related to Pepto-Bismol use - Hemoccult Cards (X3 cards); Future  Abdominal pain, unspecified abdominal location/gastroenteritis Gastroenteritis as cause of her symptoms.  Reviewed colonoscopy and she had no evidence of diverticulosis present.  She has had bouts of colitis in the past.  Uncertain if changes in stools is related to the [facility] for small use or if she is passing blood. - Hemoccult Cards (X3 cards); Future - CBC - Comp Met (CMET) - Lipase - C-reactive protein Zofran prescribed for nausea. Rest and hydrate. Will wait on lab results and if warranted we will treat with antibiotics, otherwise we discussed this is likely gastroenteritis and should self resolve in 7-10 days.  Reviewed expectations re: course of current medical issues. Discussed self-management of symptoms. Outlined signs and symptoms indicating need for more acute intervention. Patient verbalized understanding and all questions were answered. Patient received an After-Visit Summary.    Orders Placed This Encounter  Procedures   Hemoccult Cards (X3 cards)   CBC   Comp Met (CMET)   Lipase   C-reactive protein   Meds ordered this encounter  Medications   ondansetron (ZOFRAN) 4 MG tablet    Sig: Take 1 tablet (4 mg total) by mouth every 8 (eight) hours as needed for nausea or vomiting.    Dispense:  20 tablet    Refill:  0   Referral Orders  No referral(s) requested today     Note is dictated utilizing voice recognition software. Although note has been proof read prior to signing, occasional typographical errors still can be missed. If any questions arise, please do  not hesitate to call for verification.   electronically signed by:  Howard Pouch, DO  [place]

## 2022-12-06 ENCOUNTER — Other Ambulatory Visit (HOSPITAL_COMMUNITY): Payer: Self-pay

## 2022-12-06 NOTE — Telephone Encounter (Signed)
Spoke with patient regarding results/recommendations.  

## 2022-12-06 NOTE — Telephone Encounter (Signed)
Initiated PA on [email or web address]. On criteria page, it stated the requested medication is not covered by the [facility] pharmacy benefit plan, as it is excluded from coverage

## 2022-12-10 NOTE — Telephone Encounter (Signed)
Patient calling to speak to Erie Noe about prior auth on one of her medications  Zepbound  Please call patient at [phone]

## 2022-12-12 ENCOUNTER — Other Ambulatory Visit (INDEPENDENT_AMBULATORY_CARE_PROVIDER_SITE_OTHER): Payer: Commercial Managed Care - PPO

## 2022-12-12 ENCOUNTER — Other Ambulatory Visit: Payer: Self-pay | Admitting: Family Medicine

## 2022-12-12 DIAGNOSIS — R195 Other fecal abnormalities: Secondary | ICD-10-CM

## 2022-12-12 DIAGNOSIS — R109 Unspecified abdominal pain: Secondary | ICD-10-CM | POA: Diagnosis not present

## 2022-12-12 MED ORDER — WEGOVY 0.25 MG/0.5ML ~~LOC~~ SOAJ: 0.2500 mg | SUBCUTANEOUS | 0 refills | Status: DC

## 2022-12-12 NOTE — Telephone Encounter (Signed)
Please inform patient I have called in a prescription for Wegovy to see if it is covered for her, since visit found was not.

## 2022-12-12 NOTE — Addendum Note (Signed)
Addended by: Felix Pacini A on: 12/12/2022 12:45 PM   Modules accepted: Orders

## 2022-12-12 NOTE — Telephone Encounter (Signed)
Spoke with patient regarding results/recommendations.  

## 2022-12-13 LAB — HEMOCCULT SLIDES (X 3 CARDS)
Fecal Occult Blood: NEGATIVE
OCCULT 1: NEGATIVE
OCCULT 2: NEGATIVE
OCCULT 3: NEGATIVE
OCCULT 4: NEGATIVE
OCCULT 5: NEGATIVE

## 2022-12-17 ENCOUNTER — Telehealth: Payer: Self-pay | Admitting: Family Medicine

## 2022-12-17 NOTE — Telephone Encounter (Signed)
I informed the patient that another team handles PA's and she would like a call to discuss the status. I informed her that from the information I see in the system it was denied.

## 2022-12-25 ENCOUNTER — Ambulatory Visit (INDEPENDENT_AMBULATORY_CARE_PROVIDER_SITE_OTHER): Payer: Commercial Managed Care - PPO | Admitting: Family Medicine

## 2022-12-25 ENCOUNTER — Encounter: Payer: Self-pay | Admitting: Family Medicine

## 2022-12-25 VITALS — BP 109/77 | HR 73 | Temp 98.2°F | Wt 266.0 lb

## 2022-12-25 DIAGNOSIS — E8881 Metabolic syndrome: Secondary | ICD-10-CM

## 2022-12-25 DIAGNOSIS — R7303 Prediabetes: Secondary | ICD-10-CM

## 2022-12-25 MED ORDER — SEMAGLUTIDE(0.25 OR 0.5MG/DOS) 2 MG/3ML ~~LOC~~ SOPN: 0.5000 mg | PEN_INJECTOR | SUBCUTANEOUS | 1 refills | Status: DC

## 2022-12-25 NOTE — Patient Instructions (Addendum)
Return in about 6 weeks (around 02/05/2023).        Great to see you today.  I have refilled the medication(s) we provide.   If labs were collected, we will inform you of lab results once received either by echart message or telephone call.   - echart message- for normal results that have been seen by the patient already.   - telephone call: abnormal results or if patient has not viewed results in their echart.

## 2022-12-25 NOTE — Progress Notes (Signed)
[name] , 30-May-1971, 52 y.o., female MRN: [id number] Patient Care Team    Relationship Specialty Notifications Start End  Natalia Leatherwood, DO PCP - General Family Medicine  05/01/15   Albertina Parr  Nurse Practitioner  12/26/15   Richarda Overlie, MD Consulting Physician Obstetrics and Gynecology  09/13/16   Zena Amos, MD Consulting Physician Psychiatry  12/21/19   Armbruster, Willaim Rayas, MD Consulting Physician Gastroenterology  08/22/21     Chief Complaint  Patient presents with   Prediabetes     Subjective: AZURI BOZARD is a 52 y.o. Pt presents for an OV to discuss prediabetes and morbid obesity.   Weight: 275>267 lbs BMI: 45.2> 43.7  Patient reports today that she has lost weight, however that was mostly secondary to the recent gastrointestinal illness.  She has started exercising and has started watching her diet.  She has not fully committed to either exercise or diet as suggested, but she just came back from vacation and plans to start ASAP. Prior note Patient reports she has not been watching her diet or exercising routinely. She is ready to get started on a lifestyle change/plan. A1c was 6.3 and FBG was elevated in diabetic range x1, HTN, HLD with higher risk mental health medications that cause weight gain as side effects.      12/25/2022    9:06 AM 09/13/2022    8:40 AM 02/12/2022    8:17 AM 11/21/2021    2:47 PM 08/22/2021    1:48 PM  Depression screen PHQ 2/9  Decreased Interest 0 1 1 1 1   Down, Depressed, Hopeless 0 1 1 1 2   PHQ - 2 Score 0 2 2 2 3   Altered sleeping 0 1 0  1  Tired, decreased energy 0 1 1  1   Change in appetite 0 1 1  2   Feeling bad or failure about yourself  0 1 1  1   Trouble concentrating 0 0 0  0  Moving slowly or fidgety/restless 0 0 0  0  Suicidal thoughts 0 0 0  1  PHQ-9 Score 0 6 5  9     Allergies  Allergen Reactions   Ace Inhibitors Swelling and Other (See Comments)    Angioedema    Morphine Itching    Social History   Social History Narrative   Lives with husband   Caffeine- 2-12 oz cans daily   Past Medical History:  Diagnosis Date   Anxiety    takes Clonazepam daily as needed   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 Evelene Croon, LOV 03/2022 per pt   Fibroids 2023   GERD (gastroesophageal reflux disease)    takes Nexium daily   Headache(784.0)    chronic migraine, follows with [facility], Anastasia Fiedler, NP, LOV 07/06/21 as of 06/03/22.   Hyperlipidemia    takes Atorvastatin daily, follows with PCP Dr. Felix Pacini.   Hypertension    takes Amlodipine and Propranolol daily, follows w/ PCP Dr. Felix Pacini, lov 08/22/21 for preventative health.   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 Felix Pacini, takes Lasix prn   Parotiditis  11/16/2021   PONV (postoperative nausea and vomiting)    Pre-diabetes 02/12/2022   Hgb A1C 5.9   Precancerous skin lesion    left under arm   SAH (subarachnoid hemorrhage) 2014   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   Sleep apnea    MILD, 12/22/15 split night study in Epic   Vitamin B12 deficiency    takes supplement   Wears contact lenses    Wears glasses    Past Surgical History:  Procedure Laterality Date   BRAIN SURGERY  01/2013   aneurysm coiling   COLONOSCOPY  01/16/2021   multiple polyps   DILATION AND CURETTAGE OF UTERUS  2009   HYSTERECTOMY ABDOMINAL WITH SALPINGO-OOPHORECTOMY Left 06/24/2022   Procedure: TOTAL ABDOMINAL HYSTERECTOMY, LEFT SALPINGO-OOPHORECTOMY, RIGHT SALPINGECTOMY;  Surgeon: Richarda Overlie, MD;  Location: [facility];  Service: Gynecology;  Laterality:  Left;   IR RADIOLOGIST EVAL & MGMT  12/05/2020   LAPAROTOMY  2004   ovarian cyst   MYOMECTOMY  05/16/2011   Procedure: MYOMECTOMY;  Surgeon: Meriel Pica;  Location: [facility];  Service: Gynecology;  Laterality: N/A;  abdominal   RADIOLOGY WITH ANESTHESIA N/A 02/18/2013   Procedure: RADIOLOGY WITH ANESTHESIA;  Surgeon: Oneal Grout, MD;  Location: [facility];  Service: Radiology;  Laterality: N/A;   RADIOLOGY WITH ANESTHESIA N/A 01/26/2014   Procedure: RADIOLOGY WITH ANESTHESIA;  Surgeon: Oneal Grout, MD;  Location: [facility];  Service: Radiology;  Laterality: N/A;   RADIOLOGY WITH ANESTHESIA N/A 07/25/2014   Procedure: RADIOLOGY WITH ANESTHESIA;  Surgeon: Oneal Grout, MD;  Location: [facility];  Service: Radiology;  Laterality: N/A;   Family History  Problem Relation Age of Onset   Heart disease Mother 42       cad   Lupus Mother    COPD Mother    Arthritis Mother        rheumatoid   AVM Mother    Diabetes Mother    Bipolar disorder Mother    Drug abuse Mother    Fibromyalgia Mother    Colon polyps Mother    Diabetes Father    Cancer Father        thyroid, lung   Depression Sister    Diabetes Sister    Hyperlipidemia Sister    Hypertension Sister    Cancer Paternal Grandfather        colon   Colon cancer Paternal Grandfather    Alcohol abuse Paternal Uncle    Drug abuse Maternal Grandfather    Alcohol abuse Maternal Grandfather    Esophageal cancer Neg Hx    Stomach cancer Neg Hx    Rectal cancer Neg Hx    Allergies as of 12/25/2022       Reactions   Ace Inhibitors Swelling, Other (See Comments)   Angioedema    Morphine Itching        Medication List        Accurate as of December 25, 2022 12:40 PM. If you have any questions, ask your nurse or doctor.          amLODipine 10 MG tablet Commonly known as: NORVASC Take 1 tablet (10 mg total) by mouth daily.   atorvastatin 80 MG tablet Commonly known as: LIPITOR Take 1 tablet (80 mg total) by  mouth daily.   clonazePAM 0.5 MG tablet Commonly known as: KLONOPIN Take 0.5-1 mg by mouth 2 (two) times daily. 0.5mg   in the mornnig,  in the evening   DULoxetine 60 MG capsule Commonly known as: CYMBALTA Take 120 mg by mouth daily.   Eszopiclone 3 MG Tabs Take 3 mg by mouth at bedtime.   fenofibrate 145 MG tablet Commonly known as: TRICOR Take 1 tablet (145 mg total) by mouth daily.   fexofenadine 60 MG tablet Commonly known as: ALLEGRA Take 60 mg by mouth daily.   FISH OIL PO Take 1 capsule by mouth daily.   Fluocinolone Acetonide Scalp 0.01 % Oil Apply topically as needed.   furosemide 20 MG tablet Commonly known as: LASIX Take 1 tablet (20 mg total) by mouth daily as needed.   ibuprofen 200 MG tablet Commonly known as: Advil Take 4 tablets (800 mg total) by mouth every 8 (eight) hours as needed for moderate pain.   ketoconazole 2 % shampoo Commonly known as: NIZORAL Apply topically once a week.   multivitamin with minerals Tabs tablet Take 1 tablet by mouth daily.   NEXIUM PO Take by mouth.   ondansetron 4 MG tablet Commonly known as: ZOFRAN Take 1 tablet (4 mg total) by mouth every 8 (eight) hours as needed for nausea or vomiting.   prochlorperazine 10 MG tablet Commonly known as: COMPAZINE Take 1 tablet by mouth 3 (three) times daily as needed.   propranolol ER 120 MG 24 hr capsule Commonly known as: INDERAL LA Take 1 capsule (120 mg total) by mouth daily.   QUEtiapine Fumarate 150 MG 24 hr tablet Commonly known as: SEROQUEL XR Take 150 mg by mouth at bedtime.   Semaglutide(0.25 or 0.5MG /DOS) 2 MG/3ML Sopn Inject 0.5 mg into the skin once a week. Started by: Felix Pacini, DO   traZODone 50 MG tablet Commonly known as: DESYREL Take 100 mg by mouth at bedtime. Takes 2   triamcinolone cream 0.1 % Commonly known as: KENALOG Apply topically 2 (two) times daily.   vitamin B-12 100 MCG tablet Commonly known as: CYANOCOBALAMIN Take 100 mcg  by mouth daily. Pt unsure of dosage.   Wegovy 0.25 MG/0.5ML Soaj Generic drug: Semaglutide-Weight Management Inject 0.25 mg into the skin once a week.        All past medical history, surgical history, allergies, family history, immunizations andmedications were updated in the EMR today and reviewed under the history and medication portions of their EMR.     ROS Negative, with the exception of above mentioned in HPI   Objective:  BP 109/77   Pulse 73   Temp 98.2 F (36.8 C)   Wt 266 lb (120.7 kg)   LMP 06/09/2019 (Approximate)   SpO2 95%   BMI 43.79 kg/m  Body mass index is 43.79 kg/m. Physical Exam Vitals and nursing note reviewed.  Constitutional:      General: She is not in acute distress.    Appearance: Normal appearance. She is normal weight. She is not ill-appearing or toxic-appearing.  HENT:     Head: Normocephalic and atraumatic.  Eyes:     General: No scleral icterus.       Right eye: No discharge.        Left eye: No discharge.     Extraocular Movements: Extraocular movements intact.     Conjunctiva/sclera: Conjunctivae normal.     Pupils: Pupils are equal, round, and reactive to light.  Skin:    Findings: No rash.  Neurological:     Mental Status: She is alert and oriented to person, place, and time. Mental status is at baseline.  Motor: No weakness.     Coordination: Coordination normal.     Gait: Gait normal.  Psychiatric:        Mood and Affect: Mood normal.        Behavior: Behavior normal.        Thought Content: Thought content normal.        Judgment: Judgment normal.    No results found. No results found. No results found for this or any previous visit (from the past 24 hour(s)).  Assessment/Plan: JANITZA REVUELTA is a 52 y.o. female present for OV for  Morbid obesity (HCC)/Metabolic syndrome/HTN/Prediabetes Patient has been  counseled on exercise, calorie counting, weight loss and potential medications to help with weight loss  today. -Patient has been  provided with online resources for: Weekly net calorie calculator.  Applications for calorie counting.  Patient was advised to ensure she is taking in adequate nutrition daily by meeting calorie goals. -Patient has been educated on dietary changes to not only lose weight but to eat healthy.   Patient has been  educated on glycemic index. -Patient was educated on exercise goal of 150 minutes a week (plus warm up and cool down) of cardiovascular exercise.  Patient was educated on heart rate for cardiovascular and fat burning zones. -Patient was encouraged to maintain adequate water consumption of at least 120 ounces a day, more if exercising/sweating. Fasting glucose 134, A1c 6.3 Start Ozempic taper, sample provided to her today.  Prescription for Ozempic 0.5 mg weekly injection prescribed to pharmacy to start after taper sample F/u provider in 4 weeks with food log  Reviewed expectations re: course of current medical issues. Discussed self-management of symptoms. Outlined signs and symptoms indicating need for more acute intervention. Patient verbalized understanding and all questions were answered. Patient received an After-Visit Summary.    No orders of the defined types were placed in this encounter.  Meds ordered this encounter  Medications   Semaglutide,0.25 or 0.5MG /DOS, 2 MG/3ML SOPN    Sig: Inject 0.5 mg into the skin once a week.    Dispense:  3 mL    Refill:  1   Referral Orders  No referral(s) requested today     Note is dictated utilizing voice recognition software. Although note has been proof read prior to signing, occasional typographical errors still can be missed. If any questions arise, please do not hesitate to call for verification.   electronically signed by:  Felix Pacini, DO   Primary Care - [place]

## 2023-01-07 NOTE — Telephone Encounter (Signed)
noted 

## 2023-01-08 ENCOUNTER — Other Ambulatory Visit (HOSPITAL_COMMUNITY): Payer: Self-pay

## 2023-02-05 ENCOUNTER — Encounter: Payer: Self-pay | Admitting: Family Medicine

## 2023-02-05 ENCOUNTER — Ambulatory Visit (INDEPENDENT_AMBULATORY_CARE_PROVIDER_SITE_OTHER): Payer: Commercial Managed Care - PPO | Admitting: Family Medicine

## 2023-02-05 DIAGNOSIS — Z6841 Body Mass Index (BMI) 40.0 and over, adult: Secondary | ICD-10-CM | POA: Diagnosis not present

## 2023-02-05 DIAGNOSIS — R7303 Prediabetes: Secondary | ICD-10-CM

## 2023-02-05 DIAGNOSIS — E8881 Metabolic syndrome: Secondary | ICD-10-CM | POA: Diagnosis not present

## 2023-02-05 MED ORDER — SEMAGLUTIDE (1 MG/DOSE) 4 MG/3ML ~~LOC~~ SOPN: 1.0000 mg | PEN_INJECTOR | SUBCUTANEOUS | 1 refills | Status: DC

## 2023-02-05 NOTE — Progress Notes (Signed)
[name] , 09/08/1970, 52 y.o., female MRN: [id number] Patient Care Team    Relationship Specialty Notifications Start End  Natalia Leatherwood, DO PCP - General Family Medicine  05/01/15   Albertina Parr  Nurse Practitioner  12/26/15   Richarda Overlie, MD Consulting Physician Obstetrics and Gynecology  09/13/16   Zena Amos, MD Consulting Physician Psychiatry  12/21/19   Armbruster, Willaim Rayas, MD Consulting Physician Gastroenterology  08/22/21     Chief Complaint  Patient presents with   Obesity     Subjective: [name] is a 52 y.o. Pt presents for an OV to discuss prediabetes and morbid obesity.   Weight: 275>267> 262 lbs BMI: 45.2> 43.7> 43.13  WUJ:[id number] Ozempic taper Diet: Making better options getting used to low glycemic index diet Calories: Not calculating Exercise:walking 20 min 2x a day/5 days a week Water:120 Barriers: Meal prepping  Prior note: Patient reports today that she has lost weight, however that was mostly secondary to the recent gastrointestinal illness.  She has started exercising and has started watching her diet.  She has not fully committed to either exercise or diet as suggested, but she just came back from vacation and plans to start ASAP. Prior note Patient reports she has not been watching her diet or exercising routinely. She is ready to get started on a lifestyle change/plan. A1c was 6.3 and FBG was elevated in diabetic range x1, HTN, HLD with higher risk mental health medications that cause weight gain as side effects.      12/25/2022    9:06 AM 09/13/2022    8:40 AM 02/12/2022    8:17 AM 11/21/2021    2:47 PM 08/22/2021    1:48 PM  Depression screen PHQ 2/9  Decreased Interest 0 1 1 1 1   Down, Depressed, Hopeless 0 1 1 1 2   PHQ - 2 Score 0 2 2 2 3   Altered sleeping 0 1 0  1  Tired, decreased energy 0 1 1  1   Change in appetite 0 1 1  2   Feeling bad or failure about yourself  0 1 1  1   Trouble  concentrating 0 0 0  0  Moving slowly or fidgety/restless 0 0 0  0  Suicidal thoughts 0 0 0  1  PHQ-9 Score 0 6 5  9     Allergies  Allergen Reactions   Ace Inhibitors Swelling and Other (See Comments)    Angioedema    Morphine Itching   Social History   Social History Narrative   Lives with husband   Caffeine- 2-12 oz cans daily   Past Medical History:  Diagnosis Date   Anxiety    takes Clonazepam daily as needed   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 Evelene Croon, LOV 03/2022 per pt   Fibroids 2023   GERD (gastroesophageal reflux disease)    takes Nexium daily   Headache(784.0)    chronic migraine, follows with [facility], Anastasia Fiedler, NP, LOV 07/06/21 as of 06/03/22.   Hyperlipidemia    takes Atorvastatin daily, follows with PCP Dr. Felix Pacini.   Hypertension    takes Amlodipine and Propranolol daily, follows w/ PCP Dr. Felix Pacini, lov 08/22/21 for preventative health.  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 Felix Pacini, takes Lasix prn   Parotiditis 11/16/2021   PONV (postoperative nausea and vomiting)    Pre-diabetes 02/12/2022   Hgb A1C 5.9   Precancerous skin lesion    left under arm   SAH (subarachnoid hemorrhage) (HCC) 2014   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   Sleep apnea    MILD, 12/22/15 split night study in Epic   Vitamin B12 deficiency    takes supplement   Wears contact lenses    Wears glasses    Past Surgical History:  Procedure Laterality Date   BRAIN SURGERY  01/2013   aneurysm coiling   COLONOSCOPY  01/16/2021   multiple polyps   DILATION AND CURETTAGE OF UTERUS  2009    HYSTERECTOMY ABDOMINAL WITH SALPINGO-OOPHORECTOMY Left 06/24/2022   Procedure: TOTAL ABDOMINAL HYSTERECTOMY, LEFT SALPINGO-OOPHORECTOMY, RIGHT SALPINGECTOMY;  Surgeon: Richarda Overlie, MD;  Location: [facility];  Service: Gynecology;  Laterality: Left;   IR RADIOLOGIST EVAL & MGMT  12/05/2020   LAPAROTOMY  2004   ovarian cyst   MYOMECTOMY  05/16/2011   Procedure: MYOMECTOMY;  Surgeon: Meriel Pica;  Location: [facility];  Service: Gynecology;  Laterality: N/A;  abdominal   RADIOLOGY WITH ANESTHESIA N/A 02/18/2013   Procedure: RADIOLOGY WITH ANESTHESIA;  Surgeon: Oneal Grout, MD;  Location: [facility];  Service: Radiology;  Laterality: N/A;   RADIOLOGY WITH ANESTHESIA N/A 01/26/2014   Procedure: RADIOLOGY WITH ANESTHESIA;  Surgeon: Oneal Grout, MD;  Location: [facility];  Service: Radiology;  Laterality: N/A;   RADIOLOGY WITH ANESTHESIA N/A 07/25/2014   Procedure: RADIOLOGY WITH ANESTHESIA;  Surgeon: Oneal Grout, MD;  Location: [facility];  Service: Radiology;  Laterality: N/A;   Family History  Problem Relation Age of Onset   Heart disease Mother 18       cad   Lupus Mother    COPD Mother    Arthritis Mother        rheumatoid   AVM Mother    Diabetes Mother    Bipolar disorder Mother    Drug abuse Mother    Fibromyalgia Mother    Colon polyps Mother    Diabetes Father    Cancer Father        thyroid, lung   Depression Sister    Diabetes Sister    Hyperlipidemia Sister    Hypertension Sister    Cancer Paternal Grandfather        colon   Colon cancer Paternal Grandfather    Alcohol abuse Paternal Uncle    Drug abuse Maternal Grandfather    Alcohol abuse Maternal Grandfather    Esophageal cancer Neg Hx    Stomach cancer Neg Hx    Rectal cancer Neg Hx    Allergies as of 02/05/2023       Reactions   Ace Inhibitors Swelling, Other (See Comments)   Angioedema    Morphine Itching        Medication List        Accurate as of February 05, 2023   3:24 PM. If you have any questions, ask your nurse or doctor.          STOP taking these medications    Wegovy 0.25 MG/0.5ML Soaj Generic drug: Semaglutide-Weight Management Stopped by: Felix Pacini, DO  TAKE these medications    amLODipine 10 MG tablet Commonly known as: NORVASC Take 1 tablet (10 mg total) by mouth daily.   atorvastatin 80 MG tablet Commonly known as: LIPITOR Take 1 tablet (80 mg total) by mouth daily.   baclofen 10 MG tablet Commonly known as: LIORESAL Take 10 mg by mouth 3 (three) times daily as needed.   clonazePAM 0.5 MG tablet Commonly known as: KLONOPIN Take 0.5-1 mg by mouth 2 (two) times daily. 0.5mg  in the mornnig, 1mg  in the evening   DULoxetine 60 MG capsule Commonly known as: CYMBALTA Take 120 mg by mouth daily.   Eszopiclone 3 MG Tabs Take 3 mg by mouth at bedtime.   fenofibrate 145 MG tablet Commonly known as: TRICOR Take 1 tablet (145 mg total) by mouth daily.   fexofenadine 60 MG tablet Commonly known as: ALLEGRA Take 60 mg by mouth daily.   FISH OIL PO Take 1 capsule by mouth daily.   Fluocinolone Acetonide Scalp 0.01 % Oil Apply topically as needed.   furosemide 20 MG tablet Commonly known as: LASIX Take 1 tablet (20 mg total) by mouth daily as needed.   ibuprofen 200 MG tablet Commonly known as: Advil Take 4 tablets (800 mg total) by mouth every 8 (eight) hours as needed for moderate pain.   ketoconazole 2 % shampoo Commonly known as: NIZORAL Apply topically once a week.   multivitamin with minerals Tabs tablet Take 1 tablet by mouth daily.   NEXIUM PO Take by mouth.   ondansetron 4 MG tablet Commonly known as: ZOFRAN Take 1 tablet (4 mg total) by mouth every 8 (eight) hours as needed for nausea or vomiting.   prochlorperazine 10 MG tablet Commonly known as: COMPAZINE Take 1 tablet by mouth 3 (three) times daily as needed.   propranolol ER 120 MG 24 hr capsule Commonly known as: INDERAL LA Take  1 capsule (120 mg total) by mouth daily.   QUEtiapine Fumarate 150 MG 24 hr tablet Commonly known as: SEROQUEL XR Take 150 mg by mouth at bedtime.   Semaglutide(0.25 or 0.5MG /DOS) 2 MG/3ML Sopn Inject 0.5 mg into the skin once a week. What changed: Another medication with the same name was added. Make sure you understand how and when to take each. Changed by: Felix Pacini, DO   Semaglutide (1 MG/DOSE) 4 MG/3ML Sopn Inject 1 mg into the skin once a week. What changed: You were already taking a medication with the same name, and this prescription was added. Make sure you understand how and when to take each. Changed by: Felix Pacini, DO   traZODone 50 MG tablet Commonly known as: DESYREL Take 100 mg by mouth at bedtime. Takes 2   triamcinolone cream 0.1 % Commonly known as: KENALOG Apply topically 2 (two) times daily.   vitamin B-12 100 MCG tablet Commonly known as: CYANOCOBALAMIN Take 100 mcg by mouth daily. Pt unsure of dosage.        All past medical history, surgical history, allergies, family history, immunizations andmedications were updated in the EMR today and reviewed under the history and medication portions of their EMR.     ROS Negative, with the exception of above mentioned in HPI   Objective:  BP 117/82   Pulse 86   Temp 98.6 F (37 C)   Wt 262 lb (118.8 kg)   LMP 06/09/2019 (Approximate)   SpO2 96%   BMI 43.13 kg/m  Body mass index is 43.13 kg/m. Physical Exam Vitals and nursing  note reviewed.  Constitutional:      General: She is not in acute distress.    Appearance: Normal appearance. She is normal weight. She is not ill-appearing or toxic-appearing.  HENT:     Head: Normocephalic and atraumatic.  Eyes:     General: No scleral icterus.       Right eye: No discharge.        Left eye: No discharge.     Extraocular Movements: Extraocular movements intact.     Conjunctiva/sclera: Conjunctivae normal.     Pupils: Pupils are equal, round, and  reactive to light.  Skin:    Findings: No rash.  Neurological:     Mental Status: She is alert and oriented to person, place, and time. Mental status is at baseline.     Motor: No weakness.     Coordination: Coordination normal.     Gait: Gait normal.  Psychiatric:        Mood and Affect: Mood normal.        Behavior: Behavior normal.        Thought Content: Thought content normal.        Judgment: Judgment normal.    No results found. No results found. No results found for this or any previous visit (from the past 24 hour(s)).  Assessment/Plan: YENEISY ELLINGBOE is a 52 y.o. female present for OV for  Morbid obesity (HCC)/Metabolic syndrome/HTN/Prediabetes Patient has been  counseled on exercise, calorie counting, weight loss and potential medications to help with weight loss  -Patient has been  provided with online resources for: Weekly net calorie calculator.  Applications for calorie counting.  Patient was advised to ensure she is taking in adequate nutrition daily by meeting calorie goals. -Patient has been educated on dietary changes to not only lose weight but to eat healthy.   Patient has been  educated on glycemic index. -Patient was educated on exercise goal of 150 minutes a week (plus warm up and cool down) of cardiovascular exercise.  Patient was educated on heart rate for cardiovascular and fat burning zones. -Patient was encouraged to maintain adequate water consumption of at least 120 ounces a day, more if exercising/sweating. Fasting glucose 134, A1c 6.3 Goals: Meal prep.  Calorie count 1300-1400 cal a day.  Increase exercise Continue Ozempic, she will continue the Ozempic 0.5 mg weekly injection for the next 4 weeks and then increase to the Ozempic 1 mg weekly Follow-up in 6 weeks  Reviewed expectations re: course of current medical issues. Discussed self-management of symptoms. Outlined signs and symptoms indicating need for more acute intervention. Patient  verbalized understanding and all questions were answered. Patient received an After-Visit Summary.    No orders of the defined types were placed in this encounter.  Meds ordered this encounter  Medications   Semaglutide, 1 MG/DOSE, 4 MG/3ML SOPN    Sig: Inject 1 mg into the skin once a week.    Dispense:  3 mL    Refill:  1   Referral Orders  No referral(s) requested today     Note is dictated utilizing voice recognition software. Although note has been proof read prior to signing, occasional typographical errors still can be missed. If any questions arise, please do not hesitate to call for verification.   electronically signed by:  Felix Pacini, DO  [facility] - [place]

## 2023-02-05 NOTE — Patient Instructions (Signed)
Return in about 6 weeks (around 03/19/2023).        Great to see you today.  I have refilled the medication(s) we provide.   If labs were collected, we will inform you of lab results once received either by echart message or telephone call.   - echart message- for normal results that have been seen by the patient already.   - telephone call: abnormal results or if patient has not viewed results in their echart.

## 2023-02-28 ENCOUNTER — Ambulatory Visit (INDEPENDENT_AMBULATORY_CARE_PROVIDER_SITE_OTHER): Payer: Commercial Managed Care - PPO | Admitting: Family Medicine

## 2023-02-28 VITALS — BP 114/81 | HR 76 | Temp 97.3°F | Wt 255.8 lb

## 2023-02-28 DIAGNOSIS — F418 Other specified anxiety disorders: Secondary | ICD-10-CM

## 2023-02-28 DIAGNOSIS — R7303 Prediabetes: Secondary | ICD-10-CM | POA: Diagnosis not present

## 2023-02-28 DIAGNOSIS — I729 Aneurysm of unspecified site: Secondary | ICD-10-CM

## 2023-02-28 DIAGNOSIS — I10 Essential (primary) hypertension: Secondary | ICD-10-CM

## 2023-02-28 DIAGNOSIS — N1831 Chronic kidney disease, stage 3a: Secondary | ICD-10-CM

## 2023-02-28 DIAGNOSIS — E785 Hyperlipidemia, unspecified: Secondary | ICD-10-CM

## 2023-02-28 LAB — POCT GLYCOSYLATED HEMOGLOBIN (HGB A1C)
HbA1c POC (<> result, manual entry): 5.3 % (ref 4.0–5.6)
HbA1c, POC (controlled diabetic range): 5.3 % (ref 0.0–7.0)
HbA1c, POC (prediabetic range): 5.3 % — AB (ref 5.7–6.4)
Hemoglobin A1C: 5.3 % (ref 4.0–5.6)

## 2023-02-28 MED ORDER — FLUCONAZOLE 150 MG PO TABS: 150.0000 mg | ORAL_TABLET | Freq: Once | ORAL | 0 refills | Status: AC

## 2023-02-28 MED ORDER — FENOFIBRATE 145 MG PO TABS: 145.0000 mg | ORAL_TABLET | Freq: Every day | ORAL | 1 refills | Status: DC

## 2023-02-28 MED ORDER — OZEMPIC (2 MG/DOSE) 8 MG/3ML ~~LOC~~ SOPN: 2.0000 mg | PEN_INJECTOR | SUBCUTANEOUS | 5 refills | Status: DC

## 2023-02-28 MED ORDER — PROPRANOLOL HCL ER 120 MG PO CP24: 120.0000 mg | ORAL_CAPSULE | Freq: Every day | ORAL | 1 refills | Status: DC

## 2023-02-28 MED ORDER — ATORVASTATIN CALCIUM 80 MG PO TABS: 80.0000 mg | ORAL_TABLET | Freq: Every day | ORAL | 3 refills | Status: DC

## 2023-02-28 MED ORDER — AMLODIPINE BESYLATE 10 MG PO TABS: 10.0000 mg | ORAL_TABLET | Freq: Every day | ORAL | 1 refills | Status: DC

## 2023-02-28 NOTE — Patient Instructions (Signed)
No follow-ups on file.        Great to see you today.  I have refilled the medication(s) we provide.   If labs were collected, we will inform you of lab results once received either by echart message or telephone call.   - echart message- for normal results that have been seen by the patient already.   - telephone call: abnormal results or if patient has not viewed results in their echart.  

## 2023-02-28 NOTE — Progress Notes (Signed)
Patient ID: [name], female  DOB: [birth year], 52 y.o.   MRN: [id number] Patient Care Team    Relationship Specialty Notifications Start End  Natalia Leatherwood, DO PCP - General Family Medicine  05/01/15   Albertina Parr  Nurse Practitioner  12/26/15   Richarda Overlie, MD Consulting Physician Obstetrics and Gynecology  09/13/16   Zena Amos, MD Consulting Physician Psychiatry  12/21/19   Armbruster, Willaim Rayas, MD Consulting Physician Gastroenterology  08/22/21     Chief Complaint  Patient presents with   Hypertension    Subjective: [name] is a 52 y.o.  Female  present for routine chronic conditions All past medical history, surgical history, allergies, family history, immunizations, medications and social history were updated in the electronic medical record today. All recent labs, ED visits and hospitalizations within the last year were reviewed.  Hypertension/Hyperlipidemia/LE edema:   She reports compliance with her Inderal LA 120, Lipitor, tricor and amlodipine 10.  She has started the lasix 20 mg QD PRN-not taking often, with only mild improvement in LE edema.  Patient denies chest pain, shortness of breath, dizziness or lower extremity edema.  Pt is  prescribed statin and tricor,  taking fish oil 3600 mg daily.  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  Diet: low salt  Exercise: exercising routinely RF: HLD, stable coiled aneurysm, HTN, Fhx  Morbid obesity/weight loss counseling Weight (LBS): 275>267> 262 >255 BMI: 45.2> 43.7> 43.13>42.11 She reports exercise routine and water consumption has improved. Dietary modifications are going well. WUJ:[id number] Ozempic taper Diet: Making better options getting used to low glycemic index diet Calories: Not calculating Exercise:walking 20 min 2x a day/5 days a week Water:120 Barriers: Meal prepping  Prior note: Patient reports today that she has  lost weight, however that was mostly secondary to the recent gastrointestinal illness.  She has started exercising and has started watching her diet.  She has not fully committed to either exercise or diet as suggested, but she just came back from vacation and plans to start ASAP. Prior note Patient reports she has not been watching her diet or exercising routinely. She is ready to get started on a lifestyle change/plan. A1c was 6.3 and FBG was elevated in diabetic range x1, HTN, HLD with higher risk mental health medications that cause weight gain as side effects.      02/28/2023    8:23 AM 12/25/2022    9:06 AM 09/13/2022    8:40 AM 02/12/2022    8:17 AM 11/21/2021    2:47 PM  Depression screen PHQ 2/9  Decreased Interest 1 0 1 1 1   Down, Depressed, Hopeless 1 0 1 1 1   PHQ - 2 Score 2 0 2 2 2   Altered sleeping 0 0 1 0   Tired, decreased energy 2 0 1 1   Change in appetite 1 0 1 1   Feeling bad or failure about yourself  0 0 1 1   Trouble concentrating 0 0 0 0   Moving slowly or fidgety/restless 0 0 0 0   Suicidal thoughts 0 0 0 0   PHQ-9 Score 5 0 6 5   Difficult doing work/chores Somewhat difficult          02/28/2023    8:24 AM 09/13/2022    8:41 AM 02/12/2022    8:20 AM 08/22/2021    1:48 PM  GAD 7 : Generalized  Anxiety Score  Nervous, Anxious, on Edge 2 1 2 2   Control/stop worrying 1 1 1 1   Worry too much - different things 1 1 2 1   Trouble relaxing 0 1 1 1   Restless 0 0 0 0  Easily annoyed or irritable 0 0 0 0  Afraid - awful might happen 1 1 1 1   Total GAD 7 Score 5 5 7 6   Anxiety Difficulty Somewhat difficult       Immunization History  Administered Date(s) Administered   Influenza Split 05/18/2011   Influenza,inj,Quad PF,6+ Mos 07/09/2013, 05/04/2014, 05/11/2015, 05/29/2018, 06/14/2022   Influenza-Unspecified 06/20/2017, 05/14/2019, 05/12/2020, 05/11/2021   PFIZER(Purple Top)SARS-COV-2 Vaccination 11/29/2019, 12/24/2019, 08/04/2020   Td 09/02/2005   Tdap 09/13/2016      Past Medical History:  Diagnosis Date   Anxiety    takes Clonazepam daily as needed   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 Evelene Croon, LOV 03/2022 per pt   Fibroids 2023   GERD (gastroesophageal reflux disease)    takes Nexium daily   Headache(784.0)    chronic migraine, follows with [facility], Anastasia Fiedler, NP, LOV 07/06/21 as of 06/03/22.   Hyperlipidemia    takes Atorvastatin daily, follows with PCP Dr. Felix Pacini.   Hypertension    takes Amlodipine and Propranolol daily, follows w/ PCP Dr. Felix Pacini, lov 08/22/21 for preventative health.   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 Felix Pacini, takes Lasix prn   Parotiditis 11/16/2021   PONV (postoperative nausea and vomiting)    Pre-diabetes 02/12/2022   Hgb A1C 5.9   Precancerous skin lesion    left under arm   SAH (subarachnoid hemorrhage) (HCC) 2014   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   Sleep apnea    MILD, 12/22/15 split night study in Epic   Vitamin B12 deficiency    takes supplement   Wears contact lenses    Wears glasses    Allergies  Allergen Reactions   Ace Inhibitors Swelling and Other (See Comments)    Angioedema    Morphine Itching   Past Surgical History:  Procedure Laterality Date   BRAIN SURGERY  01/2013   aneurysm coiling   COLONOSCOPY  01/16/2021   multiple polyps   DILATION AND CURETTAGE OF UTERUS  2009   HYSTERECTOMY ABDOMINAL WITH SALPINGO-OOPHORECTOMY Left 06/24/2022   Procedure: TOTAL ABDOMINAL HYSTERECTOMY, LEFT SALPINGO-OOPHORECTOMY, RIGHT SALPINGECTOMY;  Surgeon: Richarda Overlie, MD;  Location: [facility] [place];   Service: Gynecology;  Laterality: Left;   IR RADIOLOGIST EVAL & MGMT  12/05/2020   LAPAROTOMY  2004   ovarian cyst   MYOMECTOMY  05/16/2011   Procedure: MYOMECTOMY;  Surgeon: Meriel Pica;  Location: [facility];  Service: Gynecology;  Laterality: N/A;  abdominal   RADIOLOGY WITH ANESTHESIA N/A 02/18/2013   Procedure: RADIOLOGY WITH ANESTHESIA;  Surgeon: Oneal Grout, MD;  Location: [facility];  Service: Radiology;  Laterality: N/A;   RADIOLOGY WITH ANESTHESIA N/A 01/26/2014   Procedure: RADIOLOGY WITH ANESTHESIA;  Surgeon: Oneal Grout, MD;  Location: [facility];  Service: Radiology;  Laterality:  N/A;   RADIOLOGY WITH ANESTHESIA N/A 07/25/2014   Procedure: RADIOLOGY WITH ANESTHESIA;  Surgeon: Oneal Grout, MD;  Location: [facility];  Service: Radiology;  Laterality: N/A;   Family History  Problem Relation Age of Onset   Heart disease Mother 46       cad   Lupus Mother    COPD Mother    Arthritis Mother        rheumatoid   AVM Mother    Diabetes Mother    Bipolar disorder Mother    Drug abuse Mother    Fibromyalgia Mother    Colon polyps Mother    Diabetes Father    Cancer Father        thyroid, lung   Depression Sister    Diabetes Sister    Hyperlipidemia Sister    Hypertension Sister    Cancer Paternal Grandfather        colon   Colon cancer Paternal Grandfather    Alcohol abuse Paternal Uncle    Drug abuse Maternal Grandfather    Alcohol abuse Maternal Grandfather    Esophageal cancer Neg Hx    Stomach cancer Neg Hx    Rectal cancer Neg Hx    Social History   Social History Narrative   Lives with husband   Caffeine- 2-12 oz cans daily    Allergies as of 02/28/2023       Reactions   Ace Inhibitors Swelling, Other (See Comments)   Angioedema    Morphine Itching        Medication List        Accurate as of February 28, 2023  8:42 AM. If you have any questions, ask your nurse or doctor.          STOP taking these medications    Eszopiclone 3 MG  Tabs   ibuprofen 200 MG tablet Commonly known as: Advil   ondansetron 4 MG tablet Commonly known as: ZOFRAN   prochlorperazine 10 MG tablet Commonly known as: COMPAZINE   Semaglutide(0.25 or 0.5MG /DOS) 2 MG/3ML Sopn Replaced by: Ozempic (2 MG/DOSE) 8 MG/3ML Sopn You also have another medication with the same name that you need to continue taking as instructed.       TAKE these medications    amLODipine 10 MG tablet Commonly known as: NORVASC Take 1 tablet (10 mg total) by mouth daily.   atorvastatin 80 MG tablet Commonly known as: LIPITOR Take 1 tablet (80 mg total) by mouth daily.   baclofen 10 MG tablet Commonly known as: LIORESAL Take 10 mg by mouth 3 (three) times daily as needed.   clonazePAM 0.5 MG tablet Commonly known as: KLONOPIN Take 0.5-1 mg by mouth 2 (two) times daily. 0.5mg  in the mornnig, 1mg  in the evening   DULoxetine 60 MG capsule Commonly known as: CYMBALTA Take 120 mg by mouth daily.   fenofibrate 145 MG tablet Commonly known as: TRICOR Take 1 tablet (145 mg total) by mouth daily.   fexofenadine 60 MG tablet Commonly known as: ALLEGRA Take 60 mg by mouth daily.   FISH OIL PO Take 1 capsule by mouth daily.   fluconazole 150 MG tablet Commonly known as: DIFLUCAN Take 1 tablet (150 mg total) by mouth once for 1 dose.   Fluocinolone Acetonide Scalp 0.01 % Oil Apply topically as needed.   furosemide 20 MG tablet Commonly known as: LASIX Take 1 tablet (20 mg total) by mouth daily as needed.   ketoconazole 2 % shampoo Commonly known as:  NIZORAL Apply topically once a week.   multivitamin with minerals Tabs tablet Take 1 tablet by mouth daily.   NEXIUM PO Take by mouth.   propranolol ER 120 MG 24 hr capsule Commonly known as: INDERAL LA Take 1 capsule (120 mg total) by mouth daily.   QUEtiapine Fumarate 150 MG 24 hr tablet Commonly known as: SEROQUEL XR Take 150 mg by mouth at bedtime.   Semaglutide (1 MG/DOSE) 4 MG/3ML  Sopn Inject 1 mg into the skin once a week. What changed:  Another medication with the same name was added. Make sure you understand how and when to take each. Another medication with the same name was removed. Continue taking this medication, and follow the directions you see here.   Ozempic (2 MG/DOSE) 8 MG/3ML Sopn Generic drug: Semaglutide (2 MG/DOSE) Inject 2 mg into the skin once a week. What changed: You were already taking a medication with the same name, and this prescription was added. Make sure you understand how and when to take each. Replaces: Semaglutide(0.25 or 0.5MG /DOS) 2 MG/3ML Sopn   traZODone 50 MG tablet Commonly known as: DESYREL Take 100 mg by mouth at bedtime. Takes 2   triamcinolone cream 0.1 % Commonly known as: KENALOG Apply topically 2 (two) times daily.   vitamin B-12 100 MCG tablet Commonly known as: CYANOCOBALAMIN Take 100 mcg by mouth daily. Pt unsure of dosage.        All past medical history, surgical history, allergies, family history, immunizations andmedications were updated in the EMR today and reviewed under the history and medication portions of their EMR.       No results found.   ROS 14 pt review of systems performed and negative (unless mentioned in an HPI)  Objective: BP 114/81   Pulse 76   Temp (!) 97.3 F (36.3 C)   Wt 255 lb 12.8 oz (116 kg)   LMP 06/09/2019 (Approximate)   SpO2 95%   BMI 42.11 kg/m  Physical Exam Vitals and nursing note reviewed.  Constitutional:      General: She is not in acute distress.    Appearance: Normal appearance. She is obese. She is not ill-appearing, toxic-appearing or diaphoretic.  HENT:     Head: Normocephalic and atraumatic.  Eyes:     General: No scleral icterus.       Right eye: No discharge.        Left eye: No discharge.     Extraocular Movements: Extraocular movements intact.     Conjunctiva/sclera: Conjunctivae normal.     Pupils: Pupils are equal, round, and reactive to  light.  Cardiovascular:     Rate and Rhythm: Normal rate and regular rhythm.  Pulmonary:     Effort: Pulmonary effort is normal. No respiratory distress.     Breath sounds: Normal breath sounds. No wheezing, rhonchi or rales.  Musculoskeletal:     Right lower leg: No edema.     Left lower leg: No edema.  Skin:    General: Skin is warm.     Findings: No rash.  Neurological:     Mental Status: She is alert and oriented to person, place, and time. Mental status is at baseline.     Motor: No weakness.     Gait: Gait normal.  Psychiatric:        Mood and Affect: Mood normal.        Behavior: Behavior normal.        Thought Content: Thought content normal.  Judgment: Judgment normal.      No results found.  Assessment/plan: [name] is a 52 y.o. female present for Chronic Conditions/illness Management Essential hypertension/HLD/morbid obesity Anterior cerebral artery aneurysm/extremity edema Stable Continue amlodipine 10  Continue propranolol 120 mg -Low-sodium diet. Diet and exercise modifications discussed today.  Continue Lipitor 80 mg daily -Continue Lasix 20 mg daily as needed. Rarely needs.  -Use compression stockings.  Weight loss can also be helpful with lower extremity edema that she has been focusing on dietary changes and exercise. - make certain fish oil supplement is 3-4g daily. -Continue fenofibrate - continue follow ups with IR for aneurysm> stable.  F/u 5.5-6 months mos for CPE/CMC combined appointment    Depression with anxiety Managed by psychiatry team  Stage 3a chronic kidney disease (HCC) Continues to improve over time since lithium was stopped.  Continue to monitor yearly  Morbid obesity (HCC)/Metabolic syndrome/HTN/Prediabetes - POCT HgB A1C 6.0> 5.3 >5.8>5.9>6.3>6.1> 5.3 collected today Patient has been  counseled on exercise, calorie counting, weight loss and potential medications to help with weight loss  -Patient has been   provided with online resources for: Weekly net calorie calculator.  Applications for calorie counting.  Patient was advised to ensure she is taking in adequate nutrition daily by meeting calorie goals. -Patient has been educated on dietary changes to not only lose weight but to eat healthy.   Patient has been  educated on glycemic index. -Patient was educated on exercise goal of 150 minutes a week (plus warm up and cool down) of cardiovascular exercise.  Patient was educated on heart rate for cardiovascular and fat burning zones. -Patient was encouraged to maintain adequate water consumption of at least 120 ounces a day, more if exercising/sweating. Fasting glucose 134, A1c 6.3 at start Goals: Meal prep.  Calorie count 1300-1400 cal a day.  Increase exercise Continue Ozempic> increase to the Ozempic 1 mg weekly x4 doses, then 2 mg weekly Follow-up in 4-6 weeks on prediabetes/weight  Return in about 7 months (around 09/15/2023) for cpe (20 min), Routine chronic condition follow-up.  Orders Placed This Encounter  Procedures   POCT HgB A1C   Meds ordered this encounter  Medications   amLODipine (NORVASC) 10 MG tablet    Sig: Take 1 tablet (10 mg total) by mouth daily.    Dispense:  90 tablet    Refill:  1   atorvastatin (LIPITOR) 80 MG tablet    Sig: Take 1 tablet (80 mg total) by mouth daily.    Dispense:  90 tablet    Refill:  3   fenofibrate (TRICOR) 145 MG tablet    Sig: Take 1 tablet (145 mg total) by mouth daily.    Dispense:  90 tablet    Refill:  1   propranolol ER (INDERAL LA) 120 MG 24 hr capsule    Sig: Take 1 capsule (120 mg total) by mouth daily.    Dispense:  90 capsule    Refill:  1   fluconazole (DIFLUCAN) 150 MG tablet    Sig: Take 1 tablet (150 mg total) by mouth once for 1 dose.    Dispense:  1 tablet    Refill:  0   Semaglutide, 2 MG/DOSE, (OZEMPIC, 2 MG/DOSE,) 8 MG/3ML SOPN    Sig: Inject 2 mg into the skin once a week.    Dispense:  3 mL    Refill:  5     Fill 1 mg pen first please   Referral Orders  No referral(s) requested today     Electronically signed by: Felix Pacini, DO [facility]

## 2023-03-19 ENCOUNTER — Ambulatory Visit: Payer: Commercial Managed Care - PPO | Admitting: Family Medicine

## 2023-03-26 ENCOUNTER — Other Ambulatory Visit: Payer: Self-pay | Admitting: Family Medicine

## 2023-03-28 ENCOUNTER — Ambulatory Visit: Payer: Commercial Managed Care - PPO | Admitting: Family Medicine

## 2023-04-04 ENCOUNTER — Ambulatory Visit: Payer: Commercial Managed Care - PPO | Admitting: Family Medicine

## 2023-04-09 ENCOUNTER — Ambulatory Visit (INDEPENDENT_AMBULATORY_CARE_PROVIDER_SITE_OTHER): Payer: Commercial Managed Care - PPO | Admitting: Family Medicine

## 2023-04-09 ENCOUNTER — Encounter: Payer: Self-pay | Admitting: Family Medicine

## 2023-04-09 DIAGNOSIS — R7303 Prediabetes: Secondary | ICD-10-CM | POA: Diagnosis not present

## 2023-04-09 DIAGNOSIS — Z713 Dietary counseling and surveillance: Secondary | ICD-10-CM

## 2023-04-09 NOTE — Progress Notes (Signed)
[name] , 01-10-1971, 52 y.o., female MRN: [id number] Patient Care Team    Relationship Specialty Notifications Start End  Natalia Leatherwood, DO PCP - General Family Medicine  05/01/15   Albertina Parr  Nurse Practitioner  12/26/15   Richarda Overlie, MD Consulting Physician Obstetrics and Gynecology  09/13/16   Zena Amos, MD Consulting Physician Psychiatry  12/21/19   Armbruster, Willaim Rayas, MD Consulting Physician Gastroenterology  08/22/21     Chief Complaint  Patient presents with   Obesity     Subjective: [name] is a 52 y.o. Pt presents for an OV to discuss prediabetes and morbid obesity.   Weight: 275>267> 262>250 lbs BMI: 45.2> 43.7> 43.13>41.19  Med: Ozempic  1 mg weekly  Diet: Making better options getting used to low glycemic index diet Calories: Not calculating Exercise:working on more frequent.  Water:120 Barriers: Meal prepping  Prior note: Patient reports today that she has lost weight, however that was mostly secondary to the recent gastrointestinal illness.  She has started exercising and has started watching her diet.  She has not fully committed to either exercise or diet as suggested, but she just came back from vacation and plans to start ASAP. Prior note Patient reports she has not been watching her diet or exercising routinely. She is ready to get started on a lifestyle change/plan. A1c was 6.3 and FBG was elevated in diabetic range x1, HTN, HLD with higher risk mental health medications that cause weight gain as side effects.      02/28/2023    8:23 AM 12/25/2022    9:06 AM 09/13/2022    8:40 AM 02/12/2022    8:17 AM 11/21/2021    2:47 PM  Depression screen PHQ 2/9  Decreased Interest 1 0 1 1 1   Down, Depressed, Hopeless 1 0 1 1 1   PHQ - 2 Score 2 0 2 2 2   Altered sleeping 0 0 1 0   Tired, decreased energy 2 0 1 1   Change in appetite 1 0 1 1   Feeling bad or failure about yourself  0 0 1 1   Trouble concentrating 0  0 0 0   Moving slowly or fidgety/restless 0 0 0 0   Suicidal thoughts 0 0 0 0   PHQ-9 Score 5 0 6 5   Difficult doing work/chores Somewhat difficult        Allergies  Allergen Reactions   Ace Inhibitors Swelling and Other (See Comments)    Angioedema    Morphine Itching   Social History   Social History Narrative   Lives with husband   Caffeine- 2-12 oz cans daily   Past Medical History:  Diagnosis Date   Anxiety    takes Clonazepam daily as needed   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 Evelene Croon, LOV 03/2022 per pt   Fibroids 2023   GERD (gastroesophageal reflux disease)    takes Nexium daily   Headache(784.0)    chronic migraine, follows with [facility], Anastasia Fiedler, NP, LOV 07/06/21 as of 06/03/22.   Hyperlipidemia    takes Atorvastatin daily, follows with PCP Dr. Felix Pacini.   Hypertension    takes Amlodipine and Propranolol daily, follows  w/ PCP Dr. Felix Pacini, lov 08/22/21 for preventative health.   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 Felix Pacini, takes Lasix prn   Parotiditis 11/16/2021   PONV (postoperative nausea and vomiting)    Pre-diabetes 02/12/2022   Hgb A1C 5.9   Precancerous skin lesion    left under arm   SAH (subarachnoid hemorrhage) (HCC) 2014   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   Sleep apnea    MILD, 12/22/15 split night study in Epic   Vitamin B12 deficiency    takes supplement   Wears contact lenses    Wears glasses    Past Surgical History:  Procedure Laterality Date   BRAIN SURGERY  01/2013   aneurysm coiling   COLONOSCOPY  01/16/2021   multiple polyps   DILATION AND  CURETTAGE OF UTERUS  2009   HYSTERECTOMY ABDOMINAL WITH SALPINGO-OOPHORECTOMY Left 06/24/2022   Procedure: TOTAL ABDOMINAL HYSTERECTOMY, LEFT SALPINGO-OOPHORECTOMY, RIGHT SALPINGECTOMY;  Surgeon: Richarda Overlie, MD;  Location: [facility];  Service: Gynecology;  Laterality: Left;   IR RADIOLOGIST EVAL & MGMT  12/05/2020   LAPAROTOMY  2004   ovarian cyst   MYOMECTOMY  05/16/2011   Procedure: MYOMECTOMY;  Surgeon: Meriel Pica;  Location: [facility];  Service: Gynecology;  Laterality: N/A;  abdominal   RADIOLOGY WITH ANESTHESIA N/A 02/18/2013   Procedure: RADIOLOGY WITH ANESTHESIA;  Surgeon: Oneal Grout, MD;  Location: [facility];  Service: Radiology;  Laterality: N/A;   RADIOLOGY WITH ANESTHESIA N/A 01/26/2014   Procedure: RADIOLOGY WITH ANESTHESIA;  Surgeon: Oneal Grout, MD;  Location: [facility];  Service: Radiology;  Laterality: N/A;   RADIOLOGY WITH ANESTHESIA N/A 07/25/2014   Procedure: RADIOLOGY WITH ANESTHESIA;  Surgeon: Oneal Grout, MD;  Location: [facility];  Service: Radiology;  Laterality: N/A;   Family History  Problem Relation Age of Onset   Heart disease Mother 68       cad   Lupus Mother    COPD Mother    Arthritis Mother        rheumatoid   AVM Mother    Diabetes Mother    Bipolar disorder Mother    Drug abuse Mother    Fibromyalgia Mother    Colon polyps Mother    Diabetes Father    Cancer Father        thyroid, lung   Depression Sister    Diabetes Sister    Hyperlipidemia Sister    Hypertension Sister    Cancer Paternal Grandfather        colon   Colon cancer Paternal Grandfather    Alcohol abuse Paternal Uncle    Drug abuse Maternal Grandfather    Alcohol abuse Maternal Grandfather    Esophageal cancer Neg Hx    Stomach cancer Neg Hx    Rectal cancer Neg Hx    Allergies as of 04/09/2023       Reactions   Ace Inhibitors Swelling, Other (See Comments)   Angioedema    Morphine Itching        Medication List         Accurate as of April 09, 2023 10:46 AM. If you have any questions, ask your nurse or doctor.          amLODipine 10 MG tablet Commonly known as: NORVASC Take  1 tablet (10 mg total) by mouth daily.   atorvastatin 80 MG tablet Commonly known as: LIPITOR Take 1 tablet (80 mg total) by mouth daily.   baclofen 10 MG tablet Commonly known as: LIORESAL Take 10 mg by mouth 3 (three) times daily as needed.   clonazePAM 0.5 MG tablet Commonly known as: KLONOPIN Take 0.5-1 mg by mouth 2 (two) times daily. 0.5mg  in the mornnig, 1mg  in the evening   DULoxetine 60 MG capsule Commonly known as: CYMBALTA Take 120 mg by mouth daily.   fenofibrate 145 MG tablet Commonly known as: TRICOR Take 1 tablet (145 mg total) by mouth daily.   fexofenadine 60 MG tablet Commonly known as: ALLEGRA Take 60 mg by mouth daily.   FISH OIL PO Take 1 capsule by mouth daily.   Fluocinolone Acetonide Scalp 0.01 % Oil Apply topically as needed.   furosemide 20 MG tablet Commonly known as: LASIX Take 1 tablet (20 mg total) by mouth daily as needed.   ketoconazole 2 % shampoo Commonly known as: NIZORAL Apply topically once a week.   multivitamin with minerals Tabs tablet Take 1 tablet by mouth daily.   NEXIUM PO Take by mouth.   Ozempic (2 MG/DOSE) 8 MG/3ML Sopn Generic drug: Semaglutide (2 MG/DOSE) Inject 2 mg into the skin once a week. What changed: Another medication with the same name was removed. Continue taking this medication, and follow the directions you see here. Changed by: Felix Pacini   propranolol ER 120 MG 24 hr capsule Commonly known as: INDERAL LA Take 1 capsule (120 mg total) by mouth daily.   QUEtiapine Fumarate 150 MG 24 hr tablet Commonly known as: SEROQUEL XR Take 150 mg by mouth at bedtime.   traZODone 50 MG tablet Commonly known as: DESYREL Take 100 mg by mouth at bedtime. Takes 2   triamcinolone cream 0.1 % Commonly known as: KENALOG Apply topically 2 (two)  times daily.   vitamin B-12 100 MCG tablet Commonly known as: CYANOCOBALAMIN Take 100 mcg by mouth daily. Pt unsure of dosage.        All past medical history, surgical history, allergies, family history, immunizations andmedications were updated in the EMR today and reviewed under the history and medication portions of their EMR.     ROS Negative, with the exception of above mentioned in HPI   Objective:  BP 122/88   Pulse 66   Temp (!) 97.4 F (36.3 C)   Wt 250 lb 3.2 oz (113.5 kg)   LMP 06/09/2019 (Approximate)   SpO2 96%   BMI 41.19 kg/m  Body mass index is 41.19 kg/m. Physical Exam Vitals and nursing note reviewed.  Constitutional:      General: She is not in acute distress.    Appearance: Normal appearance. She is normal weight. She is not ill-appearing or toxic-appearing.  HENT:     Head: Normocephalic and atraumatic.  Eyes:     General: No scleral icterus.       Right eye: No discharge.        Left eye: No discharge.     Extraocular Movements: Extraocular movements intact.     Conjunctiva/sclera: Conjunctivae normal.     Pupils: Pupils are equal, round, and reactive to light.  Skin:    Findings: No rash.  Neurological:     Mental Status: She is alert and oriented to person, place, and time. Mental status is at baseline.     Motor: No weakness.     Coordination:  Coordination normal.     Gait: Gait normal.  Psychiatric:        Mood and Affect: Mood normal.        Behavior: Behavior normal.        Thought Content: Thought content normal.        Judgment: Judgment normal.    No results found. No results found. No results found for this or any previous visit (from the past 24 hour(s)).  Assessment/Plan: [name] is a 52 y.o. female present for OV for  Morbid obesity (HCC)/Metabolic syndrome/HTN/Prediabetes Patient has been  counseled on exercise, calorie counting, weight loss and potential medications to help with weight loss  -Patient  has been  provided with online resources for: Weekly net calorie calculator.  Applications for calorie counting.  Patient was advised to ensure she is taking in adequate nutrition daily by meeting calorie goals. -Patient has been educated on dietary changes to not only lose weight but to eat healthy.   Patient has been  educated on glycemic index. -Patient was educated on exercise goal of 150 minutes a week (plus warm up and cool down) of cardiovascular exercise.  Patient was educated on heart rate for cardiovascular and fat burning zones. -Patient was encouraged to maintain adequate water consumption of at least 120 ounces a day, more if exercising/sweating. Fasting glucose 134, A1c 6.3 Goals: Meal prep.  Calorie count 1300-1400 cal a day.  Increase exercise Continue Ozempic 1 mg weekly (3 weeks remaining> then 2 mg weekly) Follow-up in 6- 8 weeks  Reviewed expectations re: course of current medical issues. Discussed self-management of symptoms. Outlined signs and symptoms indicating need for more acute intervention. Patient verbalized understanding and all questions were answered. Patient received an After-Visit Summary.    No orders of the defined types were placed in this encounter.  No orders of the defined types were placed in this encounter.  Referral Orders  No referral(s) requested today     Note is dictated utilizing voice recognition software. Although note has been proof read prior to signing, occasional typographical errors still can be missed. If any questions arise, please do not hesitate to call for verification.   electronically signed by:  Felix Pacini, DO  [facility] - [place]

## 2023-04-09 NOTE — Patient Instructions (Addendum)
GREAT JOB!  Vitals - 1 value per visit     Weight (lb) Height BMI  11/27/2022 274.8   45.24 kg/m2   12/03/2022 267.6   44.05 kg/m2   12/25/2022 266   43.79 kg/m2   02/05/2023 262   43.13 kg/m2   02/28/2023 255.8   42.11 kg/m2   04/09/2023 250.2   41.19 kg/m2      Return in about 6 weeks (around 05/21/2023) for 6-8 weeks weight loss counseling.        Great to see you today.  I have refilled the medication(s) we provide.   If labs were collected or images ordered, we will inform you of  results once we have received them and reviewed. We will contact you either by echart message, or telephone call.  Please give ample time to the testing facility, and our office to run,  receive and review results. Please do not call inquiring of results, even if you can see them in your chart. We will contact you as soon as we are able. If it has been over 1 week since the test was completed, and you have not yet heard from [place], then please call us.    - echart message- for normal results that have been seen by the patient already.   - telephone call: abnormal results or if patient has not viewed results in their echart.  If a referral to a specialist was entered for you, please call us in 2 weeks if you have not heard from the specialist office to schedule.

## 2023-04-17 ENCOUNTER — Encounter (INDEPENDENT_AMBULATORY_CARE_PROVIDER_SITE_OTHER): Payer: Self-pay

## 2023-04-17 ENCOUNTER — Other Ambulatory Visit (HOSPITAL_COMMUNITY): Payer: Self-pay | Admitting: Interventional Radiology

## 2023-04-17 DIAGNOSIS — I671 Cerebral aneurysm, nonruptured: Secondary | ICD-10-CM

## 2023-04-25 ENCOUNTER — Ambulatory Visit (HOSPITAL_COMMUNITY)
Admission: RE | Admit: 2023-04-25 | Discharge: 2023-04-25 | Disposition: A | Discharge status: Home / Self Care | Payer: Commercial Managed Care - PPO | Source: Ambulatory Visit | Attending: Interventional Radiology | Admitting: Interventional Radiology

## 2023-04-25 DIAGNOSIS — I671 Cerebral aneurysm, nonruptured: Secondary | ICD-10-CM | POA: Diagnosis present

## 2023-05-13 ENCOUNTER — Other Ambulatory Visit (HOSPITAL_COMMUNITY): Payer: Self-pay | Admitting: Interventional Radiology

## 2023-05-13 ENCOUNTER — Telehealth (HOSPITAL_COMMUNITY): Payer: Self-pay

## 2023-05-13 DIAGNOSIS — I671 Cerebral aneurysm, nonruptured: Secondary | ICD-10-CM

## 2023-05-13 NOTE — Telephone Encounter (Signed)
-----   Message from Willette Brace sent at 05/13/2023  3:05 PM EDT ----- Fara Boros,   D want to see her in person for quite consult visit.  Her MAR is stable so nothing to worry about, he just wants to talk to her about the area we have been following (she has small outpouching near where D placed coli for her ACA aneurysm.)   Thanks, Aimee

## 2023-05-15 ENCOUNTER — Ambulatory Visit: Payer: Commercial Managed Care - PPO | Admitting: Family Medicine

## 2023-05-19 ENCOUNTER — Ambulatory Visit (HOSPITAL_COMMUNITY)
Admission: RE | Admit: 2023-05-19 | Discharge: 2023-05-19 | Disposition: A | Discharge status: Home / Self Care | Payer: Commercial Managed Care - PPO | Source: Ambulatory Visit | Attending: Interventional Radiology | Admitting: Interventional Radiology

## 2023-05-19 DIAGNOSIS — I671 Cerebral aneurysm, nonruptured: Secondary | ICD-10-CM

## 2023-05-20 HISTORY — PX: IR RADIOLOGIST EVAL & MGMT: IMG5224

## 2023-05-23 ENCOUNTER — Ambulatory Visit: Payer: Commercial Managed Care - PPO | Admitting: Family Medicine

## 2023-05-30 ENCOUNTER — Ambulatory Visit: Payer: Commercial Managed Care - PPO | Admitting: Family Medicine

## 2023-06-03 ENCOUNTER — Ambulatory Visit (INDEPENDENT_AMBULATORY_CARE_PROVIDER_SITE_OTHER): Payer: Commercial Managed Care - PPO | Admitting: Family Medicine

## 2023-06-03 DIAGNOSIS — Z91199 Patient's noncompliance with other medical treatment and regimen due to unspecified reason: Secondary | ICD-10-CM

## 2023-06-03 NOTE — Progress Notes (Signed)
No show

## 2023-06-06 ENCOUNTER — Encounter: Payer: Self-pay | Admitting: Family Medicine

## 2023-06-06 ENCOUNTER — Ambulatory Visit (INDEPENDENT_AMBULATORY_CARE_PROVIDER_SITE_OTHER): Payer: Commercial Managed Care - PPO | Admitting: Family Medicine

## 2023-06-06 DIAGNOSIS — R7303 Prediabetes: Secondary | ICD-10-CM

## 2023-06-06 DIAGNOSIS — I10 Essential (primary) hypertension: Secondary | ICD-10-CM | POA: Diagnosis not present

## 2023-06-06 DIAGNOSIS — Z23 Encounter for immunization: Secondary | ICD-10-CM | POA: Diagnosis not present

## 2023-06-06 LAB — POCT GLYCOSYLATED HEMOGLOBIN (HGB A1C)
HbA1c POC (<> result, manual entry): 5.5 % (ref 4.0–5.6)
HbA1c, POC (controlled diabetic range): 5.5 % (ref 0.0–7.0)
HbA1c, POC (prediabetic range): 5.5 % — AB (ref 5.7–6.4)
Hemoglobin A1C: 5.5 % (ref 4.0–5.6)

## 2023-06-06 NOTE — Patient Instructions (Signed)

## 2023-06-06 NOTE — Progress Notes (Signed)
[name] , 08-10-1971, 52 y.o., female MRN: [id number] Patient Care Team    Relationship Specialty Notifications Start End  Natalia Leatherwood, DO PCP - General Family Medicine  05/01/15   Albertina Parr  Nurse Practitioner  12/26/15   Richarda Overlie, MD Consulting Physician Obstetrics and Gynecology  09/13/16   Zena Amos, MD Consulting Physician Psychiatry  12/21/19   Armbruster, Willaim Rayas, MD Consulting Physician Gastroenterology  08/22/21     Chief Complaint  Patient presents with   morbid obesity     Subjective: [name] is a 52 y.o. Pt presents for an OV to discuss prediabetes and morbid obesity.   Weight: 275>267> 262>250>247 lbs BMI: 45.2> 43.7> 43.13>41.19>40.8  Med: Ozempic 2 mg weekly  Diet: Making better options getting used to low glycemic index diet> got off track with covid illness, but back on track now.  Calories: 1300-1400 Exercise:working on more frequent.  Water:120 Barriers: Meal prepping  Prior note: Patient reports today that she has lost weight, however that was mostly secondary to the recent gastrointestinal illness.  She has started exercising and has started watching her diet.  She has not fully committed to either exercise or diet as suggested, but she just came back from vacation and plans to start ASAP. Prior note Patient reports she has not been watching her diet or exercising routinely. She is ready to get started on a lifestyle change/plan. A1c was 6.3 and FBG was elevated in diabetic range x1, HTN, HLD with higher risk mental health medications that cause weight gain as side effects.      06/06/2023    7:53 AM 02/28/2023    8:23 AM 12/25/2022    9:06 AM 09/13/2022    8:40 AM 02/12/2022    8:17 AM  Depression screen PHQ 2/9  Decreased Interest 1 1 0 1 1  Down, Depressed, Hopeless 1 1 0 1 1  PHQ - 2 Score 2 2 0 2 2  Altered sleeping 0 0 0 1 0  Tired, decreased energy 1 2 0 1 1  Change in appetite 1 1 0 1 1   Feeling bad or failure about yourself  1 0 0 1 1  Trouble concentrating 1 0 0 0 0  Moving slowly or fidgety/restless 0 0 0 0 0  Suicidal thoughts 0 0 0 0 0  PHQ-9 Score 6 5 0 6 5  Difficult doing work/chores Not difficult at all Somewhat difficult       Allergies  Allergen Reactions   Ace Inhibitors Swelling and Other (See Comments)    Angioedema    Morphine Itching   Social History   Social History Narrative   Lives with husband   Caffeine- 2-12 oz cans daily   Past Medical History:  Diagnosis Date   Anxiety    takes Clonazepam daily as needed   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 Evelene Croon, LOV 03/2022 per pt   Fibroids 2023   GERD (gastroesophageal reflux disease)    takes Nexium daily   Headache(784.0)    chronic migraine, follows with [facility], Anastasia Fiedler, NP, LOV 07/06/21 as of 06/03/22.   Hyperlipidemia    takes Atorvastatin daily, follows with PCP Dr.  Diontre Harps.   Hypertension    takes Amlodipine and Propranolol daily, follows w/ PCP Dr. Felix Pacini, lov 08/22/21 for preventative health.   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 Felix Pacini, takes Lasix prn   Parotiditis 11/16/2021   PONV (postoperative nausea and vomiting)    Pre-diabetes 02/12/2022   Hgb A1C 5.9   Precancerous skin lesion    left under arm   S/P abdominal supracervical subtotal hysterectomy 06/24/2022   SAH (subarachnoid hemorrhage) (HCC) 2014   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   Sleep apnea    MILD, 12/22/15 split night study in Epic   Vitamin B12 deficiency    takes supplement   Wears contact lenses    Wears glasses     Past Surgical History:  Procedure Laterality Date   BRAIN SURGERY  01/2013   aneurysm coiling   COLONOSCOPY  01/16/2021   multiple polyps   DILATION AND CURETTAGE OF UTERUS  2009   HYSTERECTOMY ABDOMINAL WITH SALPINGO-OOPHORECTOMY Left 06/24/2022   Procedure: TOTAL ABDOMINAL HYSTERECTOMY, LEFT SALPINGO-OOPHORECTOMY, RIGHT SALPINGECTOMY;  Surgeon: Richarda Overlie, MD;  Location: [facility];  Service: Gynecology;  Laterality: Left;   IR RADIOLOGIST EVAL & MGMT  12/05/2020   IR RADIOLOGIST EVAL & MGMT  05/20/2023   LAPAROTOMY  2004   ovarian cyst   MYOMECTOMY  05/16/2011   Procedure: MYOMECTOMY;  Surgeon: Meriel Pica;  Location: [facility];  Service: Gynecology;  Laterality: N/A;  abdominal   RADIOLOGY WITH ANESTHESIA N/A 02/18/2013   Procedure: RADIOLOGY WITH ANESTHESIA;  Surgeon: Oneal Grout, MD;  Location: [facility];  Service: Radiology;  Laterality: N/A;   RADIOLOGY WITH ANESTHESIA N/A 01/26/2014   Procedure: RADIOLOGY WITH ANESTHESIA;  Surgeon: Oneal Grout, MD;  Location: [facility];  Service: Radiology;  Laterality: N/A;   RADIOLOGY WITH ANESTHESIA N/A 07/25/2014   Procedure: RADIOLOGY WITH ANESTHESIA;  Surgeon: Oneal Grout, MD;  Location: [facility];  Service: Radiology;  Laterality: N/A;   Family History  Problem Relation Age of Onset   Heart disease Mother 67       cad   Lupus Mother    COPD Mother    Arthritis Mother        rheumatoid   AVM Mother    Diabetes Mother    Bipolar disorder Mother    Drug abuse Mother    Fibromyalgia Mother    Colon polyps Mother    Diabetes Father    Cancer Father        thyroid, lung   Depression Sister    Diabetes Sister    Hyperlipidemia Sister    Hypertension Sister    Cancer Paternal Grandfather        colon   Colon cancer Paternal Grandfather    Alcohol abuse Paternal Uncle    Drug abuse Maternal Grandfather    Alcohol abuse Maternal Grandfather    Esophageal cancer Neg Hx    Stomach cancer  Neg Hx    Rectal cancer Neg Hx    Allergies as of 06/06/2023       Reactions   Ace Inhibitors Swelling, Other (See Comments)   Angioedema    Morphine Itching        Medication List        Accurate as of June 06, 2023  8:05 AM. If you have any questions, ask your nurse or doctor.          amLODipine 10 MG tablet Commonly known as: NORVASC Take 1 tablet (10 mg total) by mouth daily.   atorvastatin 80 MG tablet Commonly known as: LIPITOR Take 1 tablet (80 mg total) by mouth daily.   baclofen 10 MG tablet Commonly known as: LIORESAL Take 10 mg by mouth 3 (three) times daily as needed.   clonazePAM 0.5 MG tablet Commonly known as: KLONOPIN Take 0.5-1 mg by mouth 2 (two) times daily. 0.5mg  in the mornnig, 1mg  in the evening   DULoxetine 60 MG capsule Commonly known as: CYMBALTA Take 120 mg by mouth daily.   fenofibrate 145 MG tablet Commonly known as: TRICOR Take 1 tablet (145 mg total) by mouth daily.   fexofenadine 60 MG tablet Commonly known as: ALLEGRA Take 60 mg by mouth daily.   FISH OIL PO Take 1 capsule by mouth daily. De3 3000 mg   Fluocinolone Acetonide Scalp 0.01 % Oil Apply topically as needed.   furosemide 20 MG tablet Commonly known as: LASIX Take 1 tablet (20 mg total) by mouth daily as needed.   ketoconazole 2 % shampoo Commonly known as: NIZORAL Apply topically once a week.   multivitamin with minerals Tabs tablet Take 1 tablet by mouth daily.   NEXIUM PO Take by mouth.   Ozempic (2 MG/DOSE) 8 MG/3ML Sopn Generic drug: Semaglutide (2 MG/DOSE) Inject 2 mg into the skin once a week.   propranolol ER 120 MG 24 hr capsule Commonly known as: INDERAL LA Take 1 capsule (120 mg total) by mouth daily.   QUEtiapine Fumarate 150 MG 24 hr tablet Commonly known as: SEROQUEL XR Take 150 mg by mouth at bedtime.   traZODone 50 MG tablet Commonly known as: DESYREL Take 100 mg by mouth at bedtime. Takes 2   triamcinolone cream 0.1  % Commonly known as: KENALOG Apply topically 2 (two) times daily.   vitamin B-12 100 MCG tablet Commonly known as: CYANOCOBALAMIN Take 100 mcg by mouth daily. Pt unsure of dosage.        All past medical history, surgical history, allergies, family history, immunizations andmedications were updated in the EMR today and reviewed under the history and medication portions of their EMR.     ROS Negative, with the exception of above mentioned in HPI   Objective:  BP 132/82   Pulse 82   Temp 98 F (36.7 C)   Ht 5' 5.35" (1.66 m)   Wt 247 lb 9.6 oz (112.3 kg)   LMP 06/09/2019 (Approximate)   SpO2 95%   BMI 40.76 kg/m  Body mass index is 40.76 kg/m. Physical Exam Vitals and nursing note reviewed.  Constitutional:      General: She is not in acute distress.    Appearance: Normal appearance. She is not ill-appearing, toxic-appearing or diaphoretic.  HENT:     Head: Normocephalic and atraumatic.  Eyes:     General: No scleral icterus.       Right eye: No discharge.        Left eye: No discharge.     Extraocular Movements: Extraocular movements intact.     Conjunctiva/sclera: Conjunctivae normal.     Pupils: Pupils are equal, round, and reactive to light.  Cardiovascular:     Rate and Rhythm: Normal rate and regular rhythm.  Pulmonary:     Effort: Pulmonary effort is normal. No respiratory distress.  Breath sounds: Normal breath sounds. No wheezing, rhonchi or rales.  Musculoskeletal:     Right lower leg: No edema.     Left lower leg: No edema.  Skin:    General: Skin is warm.     Findings: No rash.  Neurological:     Mental Status: She is alert and oriented to person, place, and time. Mental status is at baseline.     Motor: No weakness.     Gait: Gait normal.  Psychiatric:        Mood and Affect: Mood normal.        Behavior: Behavior normal.        Thought Content: Thought content normal.        Judgment: Judgment normal.    No results found. No results  found. Results for orders placed or performed in visit on 06/06/23 (from the past 24 hour(s))  POCT HgB A1C     Status: Abnormal   Collection Time: 06/06/23  7:56 AM  Result Value Ref Range   Hemoglobin A1C 5.5 4.0 - 5.6 %   HbA1c POC (<> result, manual entry) 5.5 4.0 - 5.6 %   HbA1c, POC (prediabetic range) 5.5 (A) 5.7 - 6.4 %   HbA1c, POC (controlled diabetic range) 5.5 0.0 - 7.0 %    Assessment/Plan: [name] is a 52 y.o. female present for OV for  Morbid obesity (HCC)//HTN/Prediabetes Patient has been  counseled on exercise, calorie counting, weight loss and potential medications to help with weight loss  -Patient has been  provided with online resources for: Weekly net calorie calculator.  Applications for calorie counting.  Patient was advised to ensure she is taking in adequate nutrition daily by meeting calorie goals. -Patient has been educated on dietary changes to not only lose weight but to eat healthy.   Patient has been  educated on glycemic index. -Patient was educated on exercise goal of 150 minutes a week (plus warm up and cool down) of cardiovascular exercise.  Patient was educated on heart rate for cardiovascular and fat burning zones. -Patient was encouraged to maintain adequate water consumption of at least 120 ounces a day, more if exercising/sweating. Fasting glucose 134, A1c 6.3> start med> 6.1>5.3 (02/28/2023)> 5.5 Goals: Meal prep.  Calorie count 1300-1400 cal a day.  Increase exercise Continue Ozempic  2 mg weekly Follow-up in 6 weeks  Reviewed expectations re: course of current medical issues. Discussed self-management of symptoms. Outlined signs and symptoms indicating need for more acute intervention. Patient verbalized understanding and all questions were answered. Patient received an After-Visit Summary.    Orders Placed This Encounter  Procedures   Flu vaccine trivalent PF, 6mos and older(Flulaval,Afluria,Fluarix,Fluzone)   POCT HgB A1C    No orders of the defined types were placed in this encounter.  Referral Orders  No referral(s) requested today     Note is dictated utilizing voice recognition software. Although note has been proof read prior to signing, occasional typographical errors still can be missed. If any questions arise, please do not hesitate to call for verification.   electronically signed by:  Felix Pacini, DO  [facility] - [place]

## 2023-06-17 ENCOUNTER — Other Ambulatory Visit: Payer: Self-pay | Admitting: Family Medicine

## 2023-07-02 ENCOUNTER — Encounter: Payer: Self-pay | Admitting: Family Medicine

## 2023-07-16 ENCOUNTER — Ambulatory Visit: Payer: Commercial Managed Care - PPO | Admitting: Family Medicine

## 2023-07-18 ENCOUNTER — Ambulatory Visit: Payer: Commercial Managed Care - PPO | Admitting: Family Medicine

## 2023-07-24 LAB — HM MAMMOGRAPHY: HM Mammogram: NORMAL (ref 0–4)

## 2023-07-24 LAB — HM PAP SMEAR: HM Pap smear: NORMAL

## 2023-09-12 ENCOUNTER — Other Ambulatory Visit: Payer: Self-pay | Admitting: Family Medicine

## 2023-09-16 ENCOUNTER — Encounter: Payer: Commercial Managed Care - PPO | Admitting: Family Medicine

## 2023-10-08 ENCOUNTER — Encounter: Payer: Commercial Managed Care - PPO | Admitting: Family Medicine

## 2023-10-08 ENCOUNTER — Encounter: Payer: Self-pay | Admitting: Family Medicine

## 2023-10-08 ENCOUNTER — Ambulatory Visit: Payer: Commercial Managed Care - PPO | Admitting: Family Medicine

## 2023-10-08 VITALS — BP 118/80 | HR 70 | Temp 97.9°F | Ht 65.5 in | Wt 248.2 lb

## 2023-10-08 DIAGNOSIS — Z23 Encounter for immunization: Secondary | ICD-10-CM

## 2023-10-08 DIAGNOSIS — Z0000 Encounter for general adult medical examination without abnormal findings: Secondary | ICD-10-CM | POA: Diagnosis not present

## 2023-10-08 DIAGNOSIS — I10 Essential (primary) hypertension: Secondary | ICD-10-CM | POA: Diagnosis not present

## 2023-10-08 DIAGNOSIS — R7303 Prediabetes: Secondary | ICD-10-CM

## 2023-10-08 DIAGNOSIS — I671 Cerebral aneurysm, nonruptured: Secondary | ICD-10-CM

## 2023-10-08 DIAGNOSIS — I729 Aneurysm of unspecified site: Secondary | ICD-10-CM

## 2023-10-08 DIAGNOSIS — E782 Mixed hyperlipidemia: Secondary | ICD-10-CM | POA: Diagnosis not present

## 2023-10-08 MED ORDER — ATORVASTATIN CALCIUM 80 MG PO TABS: 80.0000 mg | ORAL_TABLET | Freq: Every day | ORAL | 3 refills | Status: AC

## 2023-10-08 MED ORDER — FUROSEMIDE 20 MG PO TABS: 20.0000 mg | ORAL_TABLET | Freq: Every day | ORAL | 3 refills | Status: AC | PRN

## 2023-10-08 MED ORDER — PROPRANOLOL HCL ER 120 MG PO CP24: 120.0000 mg | ORAL_CAPSULE | Freq: Every day | ORAL | 1 refills | Status: DC

## 2023-10-08 MED ORDER — AMLODIPINE BESYLATE 10 MG PO TABS: 10.0000 mg | ORAL_TABLET | Freq: Every day | ORAL | 1 refills | Status: DC

## 2023-10-08 MED ORDER — FENOFIBRATE 145 MG PO TABS: 145.0000 mg | ORAL_TABLET | Freq: Every day | ORAL | 3 refills | Status: AC

## 2023-10-08 MED ORDER — OZEMPIC (2 MG/DOSE) 8 MG/3ML ~~LOC~~ SOPN: 2.0000 mg | PEN_INJECTOR | SUBCUTANEOUS | 5 refills | Status: DC

## 2023-10-08 NOTE — Patient Instructions (Addendum)
 Return in about 12 weeks (around 12/31/2023) for Routine chronic condition follow-up.   [facility]- Dr. Leigh Phone: [phone]        Great to see you today.  I have refilled the medication(s) we provide.   If labs were collected or images ordered, we will inform you of  results once we have received them and reviewed. We will contact you either by echart message, or telephone call.  Please give ample time to the testing facility, and our office to run,  receive and review results. Please do not call inquiring of results, even if you can see them in your chart. We will contact you as soon as we are able. If it has been over 1 week since the test was completed, and you have not yet heard from us , then please call us .    - echart message- for normal results that have been seen by the patient already.   - telephone call: abnormal results or if patient has not viewed results in their echart.  If a referral to a specialist was entered for you, please call us  in 2 weeks if you have not heard from the specialist office to schedule.

## 2023-10-08 NOTE — Progress Notes (Signed)
 Patient ID: [name], female  DOB: June 20, 1971, 53 y.o.   MRN: [id number] Patient Care Team    Relationship Specialty Notifications Start End  Catherine Charlies LABOR, DO PCP - General Family Medicine  05/01/15   Barbaraann Sonny BROCKS  Nurse Practitioner  12/26/15   Johnnye Ade, MD Consulting Physician Obstetrics and Gynecology  09/13/16   Vincente Murrain, MD Consulting Physician Psychiatry  12/21/19   Armbruster, Elspeth SQUIBB, MD Consulting Physician Gastroenterology  08/22/21     Chief Complaint  Patient presents with   Annual Exam    Subjective: [name] is a 53 y.o.  Female  present for Cpe and combined chronic condition management appt All past medical history, surgical history, allergies, family history, immunizations, medications and social history were updated  in the electronic medical record today. All recent labs, ED visits and hospitalizations within the last year were reviewed.   Health maintenance:  Colonoscopy: PGF colon cancer history (80). Screen completed 12/2020 - 3 yr followup for polyps. Dr. Madie reminded this is due this year. Mammogram: 01/2023> requested, has completed at OB/GYN office. Dr. Johnnye. -requested Cervical cancer screening: 12/2021- GYN Immunizations: tdap 09/2016 UTD, Influenza UTD 2024 (encouraged yearly), covid series completed. Shingrix  #1 today Infectious disease screening: HIV completed and hep c completed DEXA: routine screen at 62 Hospitalizations/ED visits: reviewed  Hypertension/Hyperlipidemia/LE edema:   She reports compliance with her Inderal  LA 120, Lipitor , tricor  and amlodipine  10.  She has started the lasix  20 mg QD PRN-not taking often, with only mild improvement in LE edema.  Patient denies chest pain, shortness of breath, dizziness or lower extremity edema.  Pt is  prescribed statin and tricor ,  taking fish oil 3600 mg daily.  Patient is taking ASA 325 mg daily  Pt has a h/o cerebral aneurysm followed by neuro  IR.  Diet: low salt  Exercise: exercising routinely RF: HLD, stable coiled aneurysm, HTN, Fhx  Weight: 275>267> 262>250>247>248 lbs BMI: 45.2> 43.7> 43.13>41.19>40.8>40.6  Med: Ozempic  2 mg weekly  Diet: Making better options getting used to low glycemic index diet> got off track with covid illness, but back on track now.  Calories: 1300-1400 Exercise:working on more frequent. stopped Water:120 Barriers: Meal prepping  Prior note: Patient reports today that she has lost weight, however that was mostly secondary to the recent gastrointestinal illness.  She has started exercising and has started watching her diet.  She has not fully committed to either exercise or diet as suggested, but she just came back from vacation and plans to start ASAP. Prior note Patient reports she has not been watching her diet or exercising routinely. She is ready to get started on a lifestyle change/plan. A1c was 6.3 and FBG was elevated in diabetic range x1, HTN, HLD with higher risk mental health medications that cause weight gain as side effects.      10/08/2023    2:09 PM 06/06/2023    7:53 AM 02/28/2023    8:23 AM 12/25/2022    9:06 AM 09/13/2022    8:40 AM  Depression screen PHQ 2/9  Decreased Interest 1 1 1  0 1  Down, Depressed, Hopeless 1 1 1  0 1  PHQ - 2 Score 2 2 2  0 2  Altered sleeping 1 0 0 0 1  Tired, decreased energy 0 1 2 0 1  Change in appetite 1 1 1  0 1  Feeling bad or failure about yourself  1 1 0 0 1  Trouble concentrating 0  1 0 0 0  Moving slowly or fidgety/restless 0 0 0 0 0  Suicidal thoughts 0 0 0 0 0  PHQ-9 Score 5 6 5  0 6  Difficult doing work/chores Somewhat difficult Not difficult at all Somewhat difficult        10/08/2023    2:09 PM 02/28/2023    8:24 AM 09/13/2022    8:41 AM 02/12/2022    8:20 AM  GAD 7 : Generalized Anxiety Score  Nervous, Anxious, on Edge 1 2 1 2   Control/stop worrying 0 1 1 1   Worry too much - different things 1 1 1 2   Trouble relaxing 1 0 1 1   Restless 0 0 0 0  Easily annoyed or irritable 0 0 0 0  Afraid - awful might happen 0 1 1 1   Total GAD 7 Score 3 5 5 7   Anxiety Difficulty Somewhat difficult Somewhat difficult      Immunization History  Administered Date(s) Administered   Influenza Split 05/18/2011   Influenza, Seasonal, Injecte, Preservative Fre 06/06/2023   Influenza,inj,Quad PF,6+ Mos 07/09/2013, 05/04/2014, 05/11/2015, 05/29/2018, 06/14/2022   Influenza-Unspecified 06/20/2017, 05/14/2019, 05/12/2020, 05/11/2021   PFIZER(Purple Top)SARS-COV-2 Vaccination 11/29/2019, 12/24/2019, 08/04/2020   Td 09/02/2005   Tdap 09/13/2016   Zoster Recombinant(Shingrix ) 10/08/2023     Past Medical History:  Diagnosis Date   Anxiety    takes Clonazepam  daily as needed   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 Vincente, LOV 03/2022 per pt   Fibroids 2023   GERD (gastroesophageal reflux disease)    takes Nexium daily   Headache(784.0)    chronic migraine, follows with [facility], Jhonny Ahle, NP, LOV 07/06/21 as of 06/03/22.   Hyperlipidemia    takes Atorvastatin  daily, follows with PCP Dr. Charlies Bellini.   Hypertension    takes Amlodipine  and Propranolol  daily, follows w/ PCP Dr. Charlies Bellini, lov 08/22/21 for preventative health.   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 Charlies Bellini, takes Lasix  prn   Parotiditis 11/16/2021   PONV (postoperative nausea and vomiting)    Pre-diabetes 02/12/2022   Hgb A1C 5.9   Precancerous skin lesion    left under arm   S/P abdominal supracervical subtotal hysterectomy 06/24/2022   SAH (subarachnoid hemorrhage) (HCC) 2014   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, later had additional coiling  and a stent placed, per pt.   Sleep apnea    MILD, 12/22/15 split night study in Epic   Vitamin B12 deficiency    takes supplement   Wears contact lenses    Wears glasses    Allergies  Allergen Reactions   Ace Inhibitors Swelling and Other (See Comments)    Angioedema    Morphine  Itching   Past Surgical History:  Procedure Laterality Date   BRAIN SURGERY  01/2013   aneurysm coiling   COLONOSCOPY  01/16/2021   multiple polyps   DILATION AND CURETTAGE OF UTERUS  2009   HYSTERECTOMY ABDOMINAL WITH SALPINGO-OOPHORECTOMY Left 06/24/2022   Procedure: TOTAL ABDOMINAL HYSTERECTOMY, LEFT SALPINGO-OOPHORECTOMY, RIGHT SALPINGECTOMY;  Surgeon: Johnnye Ade, MD;  Location: [facility];  Service: Gynecology;  Laterality: Left;  IR RADIOLOGIST EVAL & MGMT  12/05/2020   IR RADIOLOGIST EVAL & MGMT  05/20/2023   LAPAROTOMY  2004   ovarian cyst   MYOMECTOMY  05/16/2011   Procedure: MYOMECTOMY;  Surgeon: Charlie CHRISTELLA Croak;  Location: [facility];  Service: Gynecology;  Laterality: N/A;  abdominal   RADIOLOGY WITH ANESTHESIA N/A 02/18/2013   Procedure: RADIOLOGY WITH ANESTHESIA;  Surgeon: Thyra MARLA Nash, MD;  Location: [facility];  Service: Radiology;  Laterality: N/A;   RADIOLOGY WITH ANESTHESIA N/A 01/26/2014   Procedure: RADIOLOGY WITH ANESTHESIA;  Surgeon: Thyra MARLA Nash, MD;  Location: [facility];  Service: Radiology;  Laterality: N/A;   RADIOLOGY WITH ANESTHESIA N/A 07/25/2014   Procedure: RADIOLOGY WITH ANESTHESIA;  Surgeon: Thyra MARLA Nash, MD;  Location: [facility];  Service: Radiology;  Laterality: N/A;   Family History  Problem Relation Age of Onset   Heart disease Mother 51       cad   Lupus Mother    COPD Mother    Arthritis Mother        rheumatoid   AVM Mother    Diabetes Mother    Bipolar disorder Mother    Drug abuse Mother    Fibromyalgia Mother    Colon polyps Mother    Diabetes Father    Cancer Father        thyroid , lung   Depression Sister     Diabetes Sister    Hyperlipidemia Sister    Hypertension Sister    Cancer Paternal Grandfather        colon   Colon cancer Paternal Grandfather    Alcohol abuse Paternal Uncle    Drug abuse Maternal Grandfather    Alcohol abuse Maternal Grandfather    Esophageal cancer Neg Hx    Stomach cancer Neg Hx    Rectal cancer Neg Hx    Social History   Social History Narrative   Lives with husband   Caffeine- 2-12 oz cans daily    Allergies as of 10/08/2023       Reactions   Ace Inhibitors Swelling, Other (See Comments)   Angioedema    Morphine  Itching        Medication List        Accurate as of October 08, 2023  3:05 PM. If you have any questions, ask your nurse or doctor.          amLODipine  10 MG tablet Commonly known as: NORVASC  Take 1 tablet (10 mg total) by mouth daily.   atorvastatin  80 MG tablet Commonly known as: LIPITOR  Take 1 tablet (80 mg total) by mouth daily.   baclofen 10 MG tablet Commonly known as: LIORESAL Take 10 mg by mouth 3 (three) times daily as needed.   clonazePAM  0.5 MG tablet Commonly known as: KLONOPIN  Take 0.5-1 mg by mouth 2 (two) times daily. 0.5mg  in the mornnig, 1mg  in the evening   DULoxetine  60 MG capsule Commonly known as: CYMBALTA  Take 120 mg by mouth daily.   fenofibrate  145 MG tablet Commonly known as: TRICOR  Take 1 tablet (145 mg total) by mouth daily.   fexofenadine 60 MG tablet Commonly known as: ALLEGRA Take 60 mg by mouth daily.   FISH OIL PO Take 1 capsule by mouth daily. De3 3000 mg   Fluocinolone Acetonide Scalp 0.01 % Oil Apply topically as needed.   furosemide  20 MG tablet Commonly known as: LASIX  Take 1 tablet (20 mg total) by mouth daily as needed.   gabapentin  600 MG tablet  Commonly known as: NEURONTIN  Take 600 mg by mouth 4 (four) times daily.   ketoconazole 2 % shampoo Commonly known as: NIZORAL Apply topically once a week.   multivitamin with minerals Tabs tablet Take 1 tablet by mouth  daily.   NEXIUM PO Take by mouth.   Ozempic  (2 MG/DOSE) 8 MG/3ML Sopn Generic drug: Semaglutide  (2 MG/DOSE) Inject 2 mg into the skin once a week.   prochlorperazine 10 MG tablet Commonly known as: COMPAZINE Take 1 tablet by mouth every 8 (eight) hours as needed.   propranolol  ER 120 MG 24 hr capsule Commonly known as: INDERAL  LA Take 1 capsule (120 mg total) by mouth daily.   QUEtiapine  Fumarate 150 MG 24 hr tablet Commonly known as: SEROQUEL  XR Take 100 mg by mouth at bedtime.   traZODone  50 MG tablet Commonly known as: DESYREL  Take 100 mg by mouth at bedtime. Takes 2   triamcinolone  cream 0.1 % Commonly known as: KENALOG  Apply topically 2 (two) times daily.   vitamin B-12 100 MCG tablet Commonly known as: CYANOCOBALAMIN  Take 100 mcg by mouth daily. Pt unsure of dosage.        All past medical history, surgical history, allergies, family history, immunizations andmedications were updated in the EMR today and reviewed under the history and medication portions of their EMR.       No results found.   ROS 14 pt review of systems performed and negative (unless mentioned in an HPI)  Objective: BP 118/80   Pulse 70   Temp 97.9 F (36.6 C)   Ht 5' 5.5 (1.664 m)   Wt 248 lb 3.2 oz (112.6 kg)   LMP 06/09/2019 (Approximate)   SpO2 100%   BMI 40.67 kg/m  Physical Exam Vitals and nursing note reviewed.  Constitutional:      General: She is not in acute distress.    Appearance: Normal appearance. She is obese. She is not ill-appearing, toxic-appearing or diaphoretic.  HENT:     Head: Normocephalic and atraumatic.     Right Ear: Tympanic membrane, ear canal and external ear normal. There is no impacted cerumen.     Left Ear: Tympanic membrane, ear canal and external ear normal. There is no impacted cerumen.     Nose: No congestion or rhinorrhea.     Mouth/Throat:     Mouth: Mucous membranes are moist.     Pharynx: Oropharynx is clear. No oropharyngeal exudate  or posterior oropharyngeal erythema.  Eyes:     General: No scleral icterus.       Right eye: No discharge.        Left eye: No discharge.     Extraocular Movements: Extraocular movements intact.     Conjunctiva/sclera: Conjunctivae normal.     Pupils: Pupils are equal, round, and reactive to light.  Cardiovascular:     Rate and Rhythm: Normal rate and regular rhythm.     Pulses: Normal pulses.     Heart sounds: Normal heart sounds. No murmur heard.    No friction rub. No gallop.  Pulmonary:     Effort: Pulmonary effort is normal. No respiratory distress.     Breath sounds: Normal breath sounds. No stridor. No wheezing, rhonchi or rales.  Chest:     Chest wall: No tenderness.  Abdominal:     General: Abdomen is flat. Bowel sounds are normal. There is no distension.     Palpations: Abdomen is soft. There is no mass.     Tenderness: There is  no abdominal tenderness. There is no right CVA tenderness, left CVA tenderness, guarding or rebound.     Hernia: No hernia is present.  Musculoskeletal:        General: No swelling, tenderness or deformity. Normal range of motion.     Cervical back: Normal range of motion and neck supple. No rigidity or tenderness.     Right lower leg: No edema.     Left lower leg: No edema.  Lymphadenopathy:     Cervical: No cervical adenopathy.  Skin:    General: Skin is warm and dry.     Coloration: Skin is not jaundiced or pale.     Findings: No bruising, erythema, lesion or rash.  Neurological:     General: No focal deficit present.     Mental Status: She is alert and oriented to person, place, and time. Mental status is at baseline.     Cranial Nerves: No cranial nerve deficit.     Sensory: No sensory deficit.     Motor: No weakness.     Coordination: Coordination normal.     Gait: Gait normal.     Deep Tendon Reflexes: Reflexes normal.  Psychiatric:        Mood and Affect: Mood normal.        Behavior: Behavior normal.        Thought Content:  Thought content normal.        Judgment: Judgment normal.      No results found.  Assessment/plan: [name] is a 53 y.o. female present for CPE and Chronic Conditions/illness Management Routine general medical examination at a health care facility (Primary) - CBC - Comprehensive metabolic panel - TSH Patient was encouraged to exercise greater than 150 minutes a week. Patient was encouraged to choose a diet filled with fresh fruits and vegetables, and lean meats. AVS provided to patient today for education/recommendation on gender specific health and safety maintenance. CBC, CMP and TSH collected today Colonoscopy: PGF colon cancer history (80). Screen completed 12/2020 - 3 yr followup for polyps. Dr. Madie reminded this is due this year. Mammogram: 01/2023> requested, has completed at OB/GYN office. Dr. Johnnye. -requested Cervical cancer screening: 12/2021- GYN Immunizations: tdap 09/2016 UTD, Influenza UTD 2024 (encouraged yearly), covid series completed. Shingrix  #1 today Infectious disease screening: HIV completed and hep c completed DEXA: routine screen at 62  Essential hypertension/HLD/morbid obesity Anterior cerebral artery aneurysm/extremity edema Stable Continue amlodipine  10  continue propranolol  120 mg -Low-sodium diet. Diet and exercise modifications discussed today.  Continue Lipitor  80 mg daily Continue Lasix  20 mg daily as needed. Rarely needs.  -Use compression stockings.  Weight loss can also be helpful with lower extremity edema that she has been focusing on dietary changes and exercise. - make certain fish oil supplement is 3-4g daily. Continue fenofibrate  Continue follow ups with IR for aneurysm> stable.  Lipid panel collected today  Depression with anxiety Managed by psychiatry team  Stage 3a chronic kidney disease (HCC) Continues to improve over time since lithium was stopped.  Continue to monitor yearly CMP collected today   Morbid  obesity (HCC)//HTN/Prediabetes Patient has been  counseled on exercise, calorie counting, weight loss and potential medications to help with weight loss  -Patient has been  provided with online resources for: Weekly net calorie calculator.  Applications for calorie counting.  Patient was advised to ensure she is taking in adequate nutrition daily by meeting calorie goals. -Patient has been educated on dietary changes to not only lose  weight but to eat healthy.   Patient has been  educated on glycemic index. -Patient was educated on exercise goal of 150 minutes a week (plus warm up and cool down) of cardiovascular exercise.  Patient was educated on heart rate for cardiovascular and fat burning zones. -Patient was encouraged to maintain adequate water consumption of at least 120 ounces a day, more if exercising/sweating. Fasting glucose 134, A1c 6.3> start med> 6.1>5.3 (02/28/2023)> 5.5> collected today Goals: Meal prep.  Calorie count 1300-1400 cal a day.  Increase exercise Continue Ozempic   2 mg weekly-may be Ozempic  resistant, give another couple months and if not helping her with the weight loss portion of the management, could consider switching to Mounjaro . Follow-up in 12 weeks  Need for zoster vaccination - Varicella-zoster vaccine IM Shingrix  No. 1 completed today.  Shingrix  No. 2 at next follow-up appointment.  Return in about 12 weeks (around 12/31/2023) for Routine chronic condition follow-up.  Orders Placed This Encounter  Procedures   Varicella-zoster vaccine IM   CBC   Comprehensive metabolic panel   Hemoglobin A1c   Lipid panel   TSH   Meds ordered this encounter  Medications   amLODipine  (NORVASC ) 10 MG tablet    Sig: Take 1 tablet (10 mg total) by mouth daily.    Dispense:  90 tablet    Refill:  1   propranolol  ER (INDERAL  LA) 120 MG 24 hr capsule    Sig: Take 1 capsule (120 mg total) by mouth daily.    Dispense:  90 capsule    Refill:  1   fenofibrate  (TRICOR ) 145  MG tablet    Sig: Take 1 tablet (145 mg total) by mouth daily.    Dispense:  90 tablet    Refill:  3   Semaglutide , 2 MG/DOSE, (OZEMPIC , 2 MG/DOSE,) 8 MG/3ML SOPN    Sig: Inject 2 mg into the skin once a week.    Dispense:  3 mL    Refill:  5    Fill 1 mg pen first please   furosemide  (LASIX ) 20 MG tablet    Sig: Take 1 tablet (20 mg total) by mouth daily as needed.    Dispense:  30 tablet    Refill:  3   atorvastatin  (LIPITOR ) 80 MG tablet    Sig: Take 1 tablet (80 mg total) by mouth daily.    Dispense:  90 tablet    Refill:  3   Referral Orders  No referral(s) requested today     Electronically signed by: Charlies Bellini, DO [facility] [place]

## 2023-10-09 LAB — COMPREHENSIVE METABOLIC PANEL
ALT: 35 U/L (ref 0–35)
AST: 27 U/L (ref 0–37)
Albumin: 4.5 g/dL (ref 3.5–5.2)
Alkaline Phosphatase: 38 U/L — ABNORMAL LOW (ref 39–117)
BUN: 12 mg/dL (ref 6–23)
CO2: 30 meq/L (ref 19–32)
Calcium: 9.7 mg/dL (ref 8.4–10.5)
Chloride: 98 meq/L (ref 96–112)
Creatinine, Ser: 1.11 mg/dL (ref 0.40–1.20)
GFR: 58.1 mL/min — ABNORMAL LOW (ref >60.00)
Glucose, Bld: 75 mg/dL (ref 70–99)
Potassium: 3.7 meq/L (ref 3.5–5.1)
Sodium: 137 meq/L (ref 135–145)
Total Bilirubin: 0.7 mg/dL (ref 0.2–1.2)
Total Protein: 7 g/dL (ref 6.0–8.3)

## 2023-10-09 LAB — CBC
HCT: 39.3 % (ref 36.0–46.0)
Hemoglobin: 12.9 g/dL (ref 12.0–15.0)
MCHC: 32.9 g/dL (ref 30.0–36.0)
MCV: 97.4 fL (ref 78.0–100.0)
Platelets: 346 10*3/uL (ref 150.0–400.0)
RBC: 4.03 Mil/uL (ref 3.87–5.11)
RDW: 12.4 % (ref 11.5–15.5)
WBC: 6.9 10*3/uL (ref 4.0–10.5)

## 2023-10-09 LAB — LIPID PANEL
Cholesterol: 173 mg/dL (ref 0–200)
HDL: 63.7 mg/dL (ref >39.00)
LDL Cholesterol: 92 mg/dL (ref 0–99)
NonHDL: 109.42
Total CHOL/HDL Ratio: 3
Triglycerides: 85 mg/dL (ref 0.0–149.0)
VLDL: 17 mg/dL (ref 0.0–40.0)

## 2023-10-09 LAB — HEMOGLOBIN A1C: Hgb A1c MFr Bld: 5.6 % (ref 4.6–6.5)

## 2023-10-09 LAB — TSH: TSH: 2.15 u[IU]/mL (ref 0.35–5.50)

## 2023-10-10 ENCOUNTER — Encounter: Payer: Self-pay | Admitting: Family Medicine

## 2023-12-16 ENCOUNTER — Ambulatory Visit: Admitting: Family Medicine

## 2023-12-23 ENCOUNTER — Telehealth: Payer: Self-pay

## 2023-12-23 ENCOUNTER — Ambulatory Visit (INDEPENDENT_AMBULATORY_CARE_PROVIDER_SITE_OTHER): Admitting: Family Medicine

## 2023-12-23 ENCOUNTER — Encounter: Payer: Self-pay | Admitting: Family Medicine

## 2023-12-23 VITALS — BP 110/80 | HR 69 | Temp 98.0°F | Wt 253.2 lb

## 2023-12-23 DIAGNOSIS — I10 Essential (primary) hypertension: Secondary | ICD-10-CM | POA: Diagnosis not present

## 2023-12-23 DIAGNOSIS — N1831 Chronic kidney disease, stage 3a: Secondary | ICD-10-CM

## 2023-12-23 DIAGNOSIS — Z23 Encounter for immunization: Secondary | ICD-10-CM

## 2023-12-23 DIAGNOSIS — I729 Aneurysm of unspecified site: Secondary | ICD-10-CM

## 2023-12-23 DIAGNOSIS — R7303 Prediabetes: Secondary | ICD-10-CM

## 2023-12-23 DIAGNOSIS — E782 Mixed hyperlipidemia: Secondary | ICD-10-CM | POA: Diagnosis not present

## 2023-12-23 MED ORDER — MOUNJARO 7.5 MG/0.5ML ~~LOC~~ SOAJ: 7.5000 mg | SUBCUTANEOUS | 0 refills | Status: DC

## 2023-12-23 MED ORDER — AMLODIPINE BESYLATE 10 MG PO TABS: 10.0000 mg | ORAL_TABLET | Freq: Every day | ORAL | 1 refills | Status: DC

## 2023-12-23 NOTE — Telephone Encounter (Signed)
 Ozempic Florence Hunt is approved exclusively as an adjunct to diet and exercise to improve glycemic control in adults with type 2 diabetes mellitus. A review of patient's medical chart reveals no documented diagnosis of type 2 diabetes or an A1C indicative of diabetes. Therefore, they do not currently meet the criteria for prior authorization of this medication. If clinically appropriate, alternative options such as Saxenda, Zepbound , or Wegovy  may be considered for this patient.  CMM KEY: [id number]

## 2023-12-23 NOTE — Progress Notes (Signed)
 Patient ID: [name], female  DOB: Feb 21, 1971, 53 y.o.   MRN: [id number] Patient Care Team    Relationship Specialty Notifications Start End  Mariel Shope, DO PCP - General Family Medicine  05/01/15   Efren Grapes  Nurse Practitioner  12/26/15   Woodrow Hazy, MD Consulting Physician Obstetrics and Gynecology  09/13/16   Herby Lolling, MD Consulting Physician Psychiatry  12/21/19   Armbruster, Lendon Queen, MD Consulting Physician Gastroenterology  08/22/21     Chief Complaint  Patient presents with   Hypertension    Subjective: [name] is a 53 y.o.  Female  present for chronic condition management appt All past medical history, surgical history, allergies, family history, immunizations, medications and social history were updated  in the electronic medical record today. All recent labs, ED visits and hospitalizations within the last year were reviewed.  Hypertension/Hyperlipidemia/LE edema:   She reports compliance with her Inderal  LA 120, Lipitor , tricor  and amlodipine  10.  She has started the lasix  20 mg QD PRN-not taking often, with only mild improvement in LE edema.  Patient denies chest pain, shortness of breath, dizziness or lower extremity edema.  Pt is  prescribed statin and tricor ,  taking fish oil 3600 mg daily.  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  Diet: low salt  Exercise: exercising routinely RF: HLD, stable coiled aneurysm, HTN, Fhx  Weight: 275>267> 262>250>247>248 > 253 lbs BMI: 45.2> 43.7> 43.13>41.19>40.8>40.6> 41.49  Med: Ozempic  2 mg weekly  Diet: Making better options getting used to low glycemic index diet Calories: 1300-1400- thinking about using NOOM to assist with calorie counting portions Exercise:working on more frequent. stopped Water:120 Barriers: Meal prepping  Prior note: Patient reports today that she has lost weight, however that was mostly secondary to the  recent gastrointestinal illness.  She has started exercising and has started watching her diet.  She has not fully committed to either exercise or diet as suggested, but she just came back from vacation and plans to start ASAP. Prior note Patient reports she has not been watching her diet or exercising routinely. She is ready to get started on a lifestyle change/plan. A1c was 6.3 and FBG was elevated in diabetic range x1, HTN, HLD with higher risk mental health medications that cause weight gain as side effects.      12/23/2023    1:59 PM 10/08/2023    2:09 PM 06/06/2023    7:53 AM 02/28/2023    8:23 AM 12/25/2022    9:06 AM  Depression screen PHQ 2/9  Decreased Interest 1 1 1 1  0  Down, Depressed, Hopeless 1 1 1 1  0  PHQ - 2 Score 2 2 2 2  0  Altered sleeping 0 1 0 0 0  Tired, decreased energy 1 0 1 2 0  Change in appetite 0 1 1 1  0  Feeling bad or failure about yourself  1 1 1  0 0  Trouble concentrating 0 0 1 0 0  Moving slowly or fidgety/restless 0 0 0 0 0  Suicidal thoughts 0 0 0 0 0  PHQ-9 Score 4 5 6 5  0  Difficult doing work/chores Somewhat difficult Somewhat difficult Not difficult at all Somewhat difficult       12/23/2023    2:00 PM 10/08/2023    2:09 PM 02/28/2023    8:24 AM 09/13/2022    8:41 AM  GAD 7 : Generalized  Anxiety Score  Nervous, Anxious, on Edge 1 1 2 1   Control/stop worrying 1 0 1 1  Worry too much - different things 1 1 1 1   Trouble relaxing 0 1 0 1  Restless 0 0 0 0  Easily annoyed or irritable 0 0 0 0  Afraid - awful might happen 1 0 1 1  Total GAD 7 Score 4 3 5 5   Anxiety Difficulty Somewhat difficult Somewhat difficult Somewhat difficult     Immunization History  Administered Date(s) Administered   Influenza Split 05/18/2011   Influenza, Seasonal, Injecte, Preservative Fre 06/06/2023   Influenza,inj,Quad PF,6+ Mos 07/09/2013, 05/04/2014, 05/11/2015, 05/29/2018, 06/14/2022   Influenza-Unspecified 06/20/2017, 05/14/2019, 05/12/2020, 05/11/2021    PFIZER(Purple Top)SARS-COV-2 Vaccination 11/29/2019, 12/24/2019, 08/04/2020   Td 09/02/2005   Tdap 09/13/2016   Zoster Recombinant(Shingrix ) 10/08/2023, 12/23/2023     Past Medical History:  Diagnosis Date   Anxiety    takes Clonazepam  daily as needed   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 Deborra Falter, LOV 03/2022 per pt   Fibroids 2023   GERD (gastroesophageal reflux disease)    takes Nexium daily   Headache(784.0)    chronic migraine, follows with [facility], Anibal Barker, NP, LOV 07/06/21 as of 06/03/22.   Hyperlipidemia    takes Atorvastatin  daily, follows with PCP Dr. Napolean Backbone.   Hypertension    takes Amlodipine  and Propranolol  daily, follows w/ PCP Dr. Napolean Backbone, lov 08/22/21 for preventative health.   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   Sleep apnea    MILD, 12/22/15 split night study in Epic   Vitamin B12 deficiency    takes supplement   Wears contact lenses    Wears glasses    Allergies  Allergen Reactions   Ace Inhibitors Swelling and Other (See Comments)    Angioedema    Morphine  Itching   Past Surgical History:  Procedure Laterality Date   BRAIN SURGERY  01/2013   aneurysm coiling   COLONOSCOPY  01/16/2021   multiple polyps   DILATION AND CURETTAGE OF UTERUS  2009    HYSTERECTOMY ABDOMINAL WITH SALPINGO-OOPHORECTOMY Left 06/24/2022   Procedure: TOTAL ABDOMINAL HYSTERECTOMY, LEFT SALPINGO-OOPHORECTOMY, RIGHT SALPINGECTOMY;  Surgeon: Woodrow Hazy, MD;  Location: [facility];  Service: Gynecology;  Laterality: Left;   IR RADIOLOGIST EVAL & MGMT  12/05/2020   IR RADIOLOGIST EVAL & MGMT  05/20/2023   LAPAROTOMY  2004   ovarian cyst   MYOMECTOMY  05/16/2011   Procedure: MYOMECTOMY;  Surgeon: Piedad Brewer;  Location: [facility];  Service: Gynecology;  Laterality: N/A;  abdominal   RADIOLOGY WITH ANESTHESIA N/A 02/18/2013   Procedure: RADIOLOGY WITH ANESTHESIA;  Surgeon: Bronson Canny, MD;  Location: [facility];  Service: Radiology;  Laterality: N/A;   RADIOLOGY WITH ANESTHESIA N/A 01/26/2014   Procedure: RADIOLOGY WITH ANESTHESIA;  Surgeon: Bronson Canny, MD;  Location: [facility];  Service: Radiology;  Laterality: N/A;   RADIOLOGY WITH ANESTHESIA N/A 07/25/2014   Procedure: RADIOLOGY WITH ANESTHESIA;  Surgeon: Bronson Canny, MD;  Location: [facility];  Service: Radiology;  Laterality: N/A;   Family History  Problem Relation Age of Onset   Heart disease Mother 45       cad   Lupus Mother    COPD Mother    Arthritis Mother        rheumatoid   AVM Mother    Diabetes Mother    Bipolar disorder Mother    Drug abuse Mother    Fibromyalgia Mother    Colon polyps Mother    Diabetes Father    Cancer Father        thyroid , lung   Depression Sister    Diabetes Sister    Hyperlipidemia Sister    Hypertension Sister    Cancer Paternal Grandfather        colon   Colon cancer Paternal Grandfather    Alcohol abuse Paternal Uncle    Drug abuse Maternal Grandfather    Alcohol abuse Maternal Grandfather    Esophageal cancer Neg Hx    Stomach cancer Neg Hx    Rectal cancer Neg Hx    Social History   Social History Narrative   Lives with husband   Caffeine- 2-12 oz cans daily    Allergies as of 12/23/2023       Reactions   Ace  Inhibitors Swelling, Other (See Comments)   Angioedema    Morphine  Itching        Medication List        Accurate as of December 23, 2023  2:31 PM. If you have any questions, ask your nurse or doctor.          amLODipine  10 MG tablet Commonly known as: NORVASC  Take 1 tablet (10 mg total) by mouth daily.   atorvastatin  80 MG tablet Commonly known as: LIPITOR  Take 1 tablet (80 mg total) by mouth daily.   baclofen 10 MG tablet Commonly known as: LIORESAL Take 10 mg by mouth 3 (three) times daily as needed.   clonazePAM  0.5 MG tablet Commonly known as: KLONOPIN  Take 0.5-1 mg by mouth 2 (two) times daily. 0.5mg  in the mornnig, 1mg  in the evening   DULoxetine  60 MG capsule Commonly known as: CYMBALTA  Take 120 mg by mouth daily.   fenofibrate  145 MG tablet Commonly known as: TRICOR  Take 1 tablet (145 mg total) by mouth daily.   fexofenadine 60 MG tablet Commonly known as: ALLEGRA Take 60 mg by mouth daily.   FISH OIL PO Take 1 capsule by mouth daily. De3 3000 mg   Fluocinolone Acetonide Scalp 0.01 % Oil Apply topically as needed.   furosemide  20 MG tablet Commonly known as: LASIX  Take 1 tablet (20 mg total) by mouth daily as needed.   gabapentin  600 MG tablet Commonly known as: NEURONTIN  Take 600 mg by mouth 4 (four) times daily.   ketoconazole 2 % shampoo Commonly known as: NIZORAL Apply topically once a week.   Mounjaro 7.5 MG/0.5ML Pen Generic drug: tirzepatide  Inject 7.5 mg into the skin once a week. Started by: Forest Pruden   multivitamin with minerals Tabs tablet Take 1 tablet by mouth daily.   NEXIUM PO Take by mouth.   Ozempic  (2 MG/DOSE)  8 MG/3ML Sopn Generic drug: Semaglutide  (2 MG/DOSE) Inject 2 mg into the skin once a week.   prochlorperazine 10 MG tablet Commonly known as: COMPAZINE Take 1 tablet by mouth every 8 (eight) hours as needed.   propranolol  ER 120 MG 24 hr capsule Commonly known as: INDERAL  LA Take 1 capsule (120 mg  total) by mouth daily.   QUEtiapine  Fumarate 150 MG 24 hr tablet Commonly known as: SEROQUEL  XR Take 50 mg by mouth at bedtime.   traZODone  50 MG tablet Commonly known as: DESYREL  Take 100 mg by mouth at bedtime. Takes 2   triamcinolone  cream 0.1 % Commonly known as: KENALOG  Apply topically 2 (two) times daily.   vitamin B-12 100 MCG tablet Commonly known as: CYANOCOBALAMIN  Take 100 mcg by mouth daily. Pt unsure of dosage.        All past medical history, surgical history, allergies, family history, immunizations andmedications were updated in the EMR today and reviewed under the history and medication portions of their EMR.       No results found.   ROS 14 pt review of systems performed and negative (unless mentioned in an HPI)  Objective: BP 110/80   Pulse 69   Temp 98 F (36.7 C)   Wt 253 lb 3.2 oz (114.9 kg)   LMP 06/09/2019 (Approximate)   SpO2 99%   BMI 41.49 kg/m  Physical Exam Vitals and nursing note reviewed.  Constitutional:      General: She is not in acute distress.    Appearance: Normal appearance. She is not ill-appearing, toxic-appearing or diaphoretic.  HENT:     Head: Normocephalic and atraumatic.  Eyes:     General: No scleral icterus.       Right eye: No discharge.        Left eye: No discharge.     Extraocular Movements: Extraocular movements intact.     Conjunctiva/sclera: Conjunctivae normal.     Pupils: Pupils are equal, round, and reactive to light.  Cardiovascular:     Rate and Rhythm: Normal rate and regular rhythm.  Pulmonary:     Effort: Pulmonary effort is normal. No respiratory distress.     Breath sounds: Normal breath sounds. No wheezing, rhonchi or rales.  Musculoskeletal:     Right lower leg: No edema.     Left lower leg: No edema.  Skin:    General: Skin is warm.     Findings: No rash.  Neurological:     Mental Status: She is alert and oriented to person, place, and time. Mental status is at baseline.     Motor:  No weakness.     Gait: Gait normal.  Psychiatric:        Mood and Affect: Mood normal.        Behavior: Behavior normal.        Thought Content: Thought content normal.        Judgment: Judgment normal.      No results found.  Assessment/plan: Coleta L Valadez is a 53 y.o. female present for Chronic Conditions/illness Management Essential hypertension/HLD/morbid obesity Anterior cerebral artery aneurysm/extremity edema Stable Continue amlodipine  10  Continue  propranolol  120 mg -Low-sodium diet. Diet and exercise modifications discussed today.  Continue  Lipitor  80 mg daily Continue Lasix  20 mg daily as needed. Rarely needs.  -Use compression stockings.  Weight loss can also be helpful with lower extremity edema that she has been focusing on dietary changes and exercise. - make certain fish oil supplement  is 3-4g daily. Continue fenofibrate  Continue follow ups with IR for aneurysm> stable.    Depression with anxiety Managed by psychiatry team  Stage 3a chronic kidney disease (HCC) Continues to improve over time since lithium was stopped.  Continue to monitor yearly UTD    Morbid obesity (HCC)//HTN/Prediabetes Patient has been  counseled on exercise, calorie counting, weight loss and potential medications to help with weight loss  -Patient has been  provided with online resources for: Weekly net calorie calculator.  Applications for calorie counting.  Patient was advised to ensure she is taking in adequate nutrition daily by meeting calorie goals. -Patient has been educated on dietary changes to not only lose weight but to eat healthy.   Patient has been  educated on glycemic index. -Patient was educated on exercise goal of 150 minutes a week (plus warm up and cool down) of cardiovascular exercise.  Patient was educated on heart rate for cardiovascular and fat burning zones. -Patient was encouraged to maintain adequate water consumption of at least 120 ounces a day, more  if exercising/sweating. Fasting glucose 134, A1c 6.3> start med> 6.1>5.3 (02/28/2023)> 5.5> collected today Goals: Meal prep.  Calorie count 1300-1400 cal a day.  Increase exercise Continue Ozempic   2 mg weekly-may be Ozempic  resistant, We discussed transitioning to Mounjaro 7.5 mg weekly injection in place of Ozempic  and she is agreeable to try this today.  She understands that if insurance approves she will need to call us  back to let us  know so I can call in the next 2 doses for her.   Follow-up in 12 weeks  Need for zoster vaccination - Varicella-zoster vaccine IM Shingrix  No. 2 completed   Return in about 12 weeks (around 03/16/2024) for Routine chronic condition follow-up.  Orders Placed This Encounter  Procedures   Varicella-zoster vaccine IM   Meds ordered this encounter  Medications   amLODipine  (NORVASC ) 10 MG tablet    Sig: Take 1 tablet (10 mg total) by mouth daily.    Dispense:  90 tablet    Refill:  1   tirzepatide  (MOUNJARO) 7.5 MG/0.5ML Pen    Sig: Inject 7.5 mg into the skin once a week.    Dispense:  2 mL    Refill:  0    Dc ozempic , start mounjaro   Referral Orders  No referral(s) requested today     Electronically signed by: Napolean Backbone, DO [facility]

## 2023-12-23 NOTE — Patient Instructions (Addendum)
 Return in about 12 weeks (around 03/16/2024) for Routine chronic condition follow-up.        Great to see you today.  I have refilled the medication(s) we provide.   If labs were collected or images ordered, we will inform you of  results once we have received them and reviewed. We will contact you either by echart message, or telephone call.  Please give ample time to the testing facility, and our office to run,  receive and review results. Please do not call inquiring of results, even if you can see them in your chart. We will contact you as soon as we are able. If it has been over 1 week since the test was completed, and you have not yet heard from us , then please call us .    - echart message- for normal results that have been seen by the patient already.   - telephone call: abnormal results or if patient has not viewed results in their echart.  If a referral to a specialist was entered for you, please call us  in 2 weeks if you have not heard from the specialist office to schedule.

## 2023-12-24 ENCOUNTER — Ambulatory Visit: Payer: Commercial Managed Care - PPO | Admitting: Family Medicine

## 2023-12-25 NOTE — Telephone Encounter (Signed)
 Please inform pt mounjaro is not covered.

## 2023-12-26 ENCOUNTER — Encounter: Payer: Self-pay | Admitting: Gastroenterology

## 2024-01-05 ENCOUNTER — Other Ambulatory Visit (HOSPITAL_COMMUNITY): Payer: Self-pay

## 2024-01-05 ENCOUNTER — Telehealth: Payer: Self-pay

## 2024-01-05 NOTE — Telephone Encounter (Signed)
 Communication  Reason for CRM: Patient called this morning to inform the provider that the Ozempic  prescription requires a prior authorization. The patient is requesting to know if Mounjaro can be prescribed as an alternative.    Please advise on PA.

## 2024-01-05 NOTE — Telephone Encounter (Signed)
 Noted.

## 2024-01-06 NOTE — Telephone Encounter (Signed)
 Communication  Reason for CRM: Patient called to follow up on prior authorization for Mounjaro. Let patient know insurance denied PA for the mounjaro and ozempic . Telephone encounter from 4/22 from Reece Cane, [facility], mentions "If clinically appropriate, alternative options such as Saxenda, Zepbound , or Wegovy  may be considered for this patient."    Patient wants to know if she can start on one of those if it'll be approved    Please advise.

## 2024-01-07 MED ORDER — ZEPBOUND 7.5 MG/0.5ML ~~LOC~~ SOLN: 7.5000 mg | SUBCUTANEOUS | 0 refills | Status: DC

## 2024-01-07 NOTE — Addendum Note (Signed)
 Addended by: Napolean Backbone A on: 01/07/2024 03:46 PM   Modules accepted: Orders

## 2024-01-07 NOTE — Telephone Encounter (Addendum)
 Called in Zepbound . Further Ozempic  and Mounjaro are denied  Please make patient aware

## 2024-01-07 NOTE — Telephone Encounter (Signed)
 Communication  Reason for CRM: Optician, dispensing pharmacy is calling regarding the ZEPBOUND  7.5 MG/0.5ML injection vial. Amber stated that they can't order the vials and can only order the injection pens. Amber stated that if the patient is needing the vials also then she would have to get the medication at another pharmacy. Callback number is [phone].    Please advise.

## 2024-01-08 ENCOUNTER — Other Ambulatory Visit (HOSPITAL_COMMUNITY): Payer: Self-pay

## 2024-01-08 ENCOUNTER — Telehealth: Payer: Self-pay

## 2024-01-08 MED ORDER — ZEPBOUND 7.5 MG/0.5ML ~~LOC~~ SOAJ: 7.5000 mg | SUBCUTANEOUS | 5 refills | Status: DC

## 2024-01-08 NOTE — Telephone Encounter (Addendum)
 Pharmacy Patient Advocate Encounter   Received notification from Pt Calls Messages that prior authorization for Zepbound  7.5MG /0.5ML pen-injectors is required/requested.   Insurance verification completed.   The patient is insured through [employer] .   Per test claim: PA required; PA started via CoverMyMeds. KEY [id number] . Waiting for clinical questions to populate.

## 2024-01-08 NOTE — Telephone Encounter (Signed)
 Noted.

## 2024-01-08 NOTE — Telephone Encounter (Signed)
 Please call pharmacy-disregard prescription for Zepbound  vial, pen called in place

## 2024-01-08 NOTE — Addendum Note (Signed)
 Addended by: Napolean Backbone A on: 01/08/2024 11:22 AM   Modules accepted: Orders

## 2024-01-08 NOTE — Telephone Encounter (Signed)
 Called and spoke with pharmacy. Pharmacy stated ZEPBOUND  7.5mg /0.4ml Pen cost is over $1000; PA is needed. Forwarded to PA team.

## 2024-01-09 ENCOUNTER — Other Ambulatory Visit (HOSPITAL_COMMUNITY): Payer: Self-pay

## 2024-01-09 NOTE — Telephone Encounter (Signed)
 Please inform patient of the new findings surrounding weight loss meds.  There are no other options as this point.  She could try the [facility] weight loss clinic in [place] next to the our [place] office, off of [address]. They have the compound version of the meds and can offer same med cheaper.

## 2024-01-09 NOTE — Telephone Encounter (Signed)
 Pharmacy Patient Advocate Encounter  Received notification from [facility] that Prior Authorization for Zepbound  7.5MG /0.5ML pen-injectors    Message from Plan Drug IS COVERED by current benefit plan. No further PA activity needed  PLEASE BE ADVISED Dose not have PA FOR COST exceed IT IS NOT A PA ISSUE IT IS COVER AND BUT NOT FOR COST. I DID A TEST CLAIM FOR WEGOVY  AS WELL AND IT HAS THE SAME COST EXCEED

## 2024-01-09 NOTE — Telephone Encounter (Signed)
 Pt aware and verbalized understanding.

## 2024-01-27 ENCOUNTER — Other Ambulatory Visit: Payer: Self-pay | Admitting: Family Medicine

## 2024-01-27 ENCOUNTER — Telehealth: Payer: Self-pay

## 2024-01-27 ENCOUNTER — Other Ambulatory Visit (HOSPITAL_COMMUNITY): Payer: Self-pay

## 2024-01-27 ENCOUNTER — Ambulatory Visit (AMBULATORY_SURGERY_CENTER)

## 2024-01-27 VITALS — Ht 65.5 in | Wt 255.0 lb

## 2024-01-27 DIAGNOSIS — Z860100 Personal history of colon polyps, unspecified: Secondary | ICD-10-CM

## 2024-01-27 MED ORDER — NA SULFATE-K SULFATE-MG SULF 17.5-3.13-1.6 GM/177ML PO SOLN
1.0000 | Freq: Once | ORAL | 0 refills | Status: AC
Start: 2024-01-27 — End: 2024-01-27

## 2024-01-27 NOTE — Telephone Encounter (Signed)
 Pharmacy Patient Advocate Encounter   Received notification from CoverMyMeds that prior authorization for Ozempic  8 is required/requested.   Insurance verification completed.   The patient is insured through [employer] .   Per test claim: patient does not have type 2 diabetes or A1C 6.5 or higher.

## 2024-01-27 NOTE — Progress Notes (Signed)
 No egg or soy allergy known to patient  No issues known to pt with past sedation with any surgeries or procedures Patient denies ever being told they had issues or difficulty with intubation  No FH of Malignant Hyperthermia Pt is not on diet pills Pt is not on  home 02  Pt is not on blood thinners  Constipation: occasionally  No A fib or A flutter Have any cardiac testing pending-- no  LOA: independent  Prep: suprep   Patient's chart reviewed by Rogena Class CNRA prior to previsit and patient appropriate for the [facility].  Previsit completed and red dot placed by patient's name on their procedure day (on provider's schedule).     PV completed with patient. Prep instructions sent via mychart and home address.

## 2024-01-28 ENCOUNTER — Other Ambulatory Visit (HOSPITAL_COMMUNITY): Payer: Self-pay | Admitting: Interventional Radiology

## 2024-01-28 DIAGNOSIS — I671 Cerebral aneurysm, nonruptured: Secondary | ICD-10-CM

## 2024-01-30 NOTE — Telephone Encounter (Signed)
 A user error has taken place: encounter opened in error, closed for administrative reasons.

## 2024-02-02 ENCOUNTER — Ambulatory Visit (HOSPITAL_COMMUNITY)
Admission: RE | Admit: 2024-02-02 | Discharge: 2024-02-02 | Disposition: A | Discharge status: Home / Self Care | Source: Ambulatory Visit | Attending: Interventional Radiology | Admitting: Interventional Radiology

## 2024-02-02 DIAGNOSIS — I671 Cerebral aneurysm, nonruptured: Secondary | ICD-10-CM | POA: Diagnosis present

## 2024-02-04 ENCOUNTER — Encounter: Payer: Self-pay | Admitting: Gastroenterology

## 2024-02-16 ENCOUNTER — Ambulatory Visit: Admitting: Gastroenterology

## 2024-02-16 ENCOUNTER — Encounter: Payer: Self-pay | Admitting: Gastroenterology

## 2024-02-16 VITALS — BP 133/92 | HR 67 | Temp 97.0°F | Resp 14 | Ht 65.5 in | Wt 255.0 lb

## 2024-02-16 DIAGNOSIS — Z860101 Personal history of adenomatous and serrated colon polyps: Secondary | ICD-10-CM | POA: Diagnosis not present

## 2024-02-16 DIAGNOSIS — K648 Other hemorrhoids: Secondary | ICD-10-CM

## 2024-02-16 DIAGNOSIS — Z1211 Encounter for screening for malignant neoplasm of colon: Secondary | ICD-10-CM

## 2024-02-16 DIAGNOSIS — Z860100 Personal history of colon polyps, unspecified: Secondary | ICD-10-CM

## 2024-02-16 DIAGNOSIS — D127 Benign neoplasm of rectosigmoid junction: Secondary | ICD-10-CM

## 2024-02-16 DIAGNOSIS — D123 Benign neoplasm of transverse colon: Secondary | ICD-10-CM

## 2024-02-16 DIAGNOSIS — D175 Benign lipomatous neoplasm of intra-abdominal organs: Secondary | ICD-10-CM

## 2024-02-16 DIAGNOSIS — D125 Benign neoplasm of sigmoid colon: Secondary | ICD-10-CM | POA: Diagnosis not present

## 2024-02-16 DIAGNOSIS — K635 Polyp of colon: Secondary | ICD-10-CM

## 2024-02-16 MED ORDER — SODIUM CHLORIDE 0.9 % IV SOLN: 500.0000 mL | Freq: Once | INTRAVENOUS | Status: DC

## 2024-02-16 NOTE — Op Note (Signed)
 [facility] Patient Name: [name] Procedure Date: 02/16/2024 11:26 AM MRN: [id number] Endoscopist: Landon Pinion P. General Kenner , MD, [id number] Age: 53 Referring MD:  Date of Birth: 02-10-1971 Gender: Female Account #: [id number] Procedure:                Colonoscopy Indications:              High risk colon cancer surveillance: Personal                            history of colonic polyps on last May 2022 -                            adenomas Medicines:                Monitored Anesthesia Care Procedure:                Pre-Anesthesia Assessment:                           - Prior to the procedure, a History and Physical                            was performed, and patient medications and                            allergies were reviewed. The patient's tolerance of                            previous anesthesia was also reviewed. The risks                            and benefits of the procedure and the sedation                            options and risks were discussed with the patient.                            All questions were answered, and informed consent                            was obtained. Prior Anticoagulants: The patient has                            taken no anticoagulant or antiplatelet agents. ASA                            Grade Assessment: III - A patient with severe                            systemic disease. After reviewing the risks and                            benefits, the patient was deemed in satisfactory  condition to undergo the procedure.                           After obtaining informed consent, the colonoscope                            was passed under direct vision. Throughout the                            procedure, the patient's blood pressure, pulse, and                            oxygen saturations were monitored continuously. The                            CF [id number] #[id number] was introduced through  the anus                            and advanced to the the cecum, identified by                            appendiceal orifice and ileocecal valve. The                            colonoscopy was performed without difficulty. The                            patient tolerated the procedure well. The quality                            of the bowel preparation was good. The ileocecal                            valve, appendiceal orifice, and rectum were                            photographed. Scope In: 11:38:52 AM Scope Out: 11:58:06 AM Scope Withdrawal Time: 0 hours 15 minutes 37 seconds  Total Procedure Duration: 0 hours 19 minutes 14 seconds  Findings:                 The perianal and digital rectal examinations were                            normal.                           There was a lipoma, in the proximal transverse                            colon.                           A 3 mm polyp was found in the transverse colon. The  polyp was sessile. The polyp was removed with a                            cold snare. Resection and retrieval were complete.                           A 3 mm polyp was found in the sigmoid colon. The                            polyp was sessile. The polyp was removed with a                            cold snare. Resection and retrieval were complete.                           A 4 mm polyp was found in the recto-sigmoid colon.                            The polyp was sessile. The polyp was removed with a                            cold snare. Resection and retrieval were complete.                           Internal hemorrhoids were found during                            retroflexion. The hemorrhoids were small.                           The exam was otherwise without abnormality. Complications:            No immediate complications. Estimated blood loss:                            Minimal. Estimated Blood Loss:     Estimated blood  loss was minimal. Impression:               - Lipoma in the proximal descending colon.                           - One 3 mm polyp in the transverse colon, removed                            with a cold snare. Resected and retrieved.                           - One 3 mm polyp in the sigmoid colon, removed with                            a cold snare. Resected and retrieved.                           -  One 4 mm polyp at the recto-sigmoid colon,                            removed with a cold snare. Resected and retrieved.                           - Internal hemorrhoids.                           - The examination was otherwise normal. Recommendation:           - Patient has a contact number available for                            emergencies. The signs and symptoms of potential                            delayed complications were discussed with the                            patient. Return to normal activities tomorrow.                            Written discharge instructions were provided to the                            patient.                           - Resume previous diet.                           - Continue present medications.                           - Await pathology results. Landon Pinion P. Chiquetta Langner, MD 02/16/2024 12:06:11 PM This report has been signed electronically.

## 2024-02-16 NOTE — Progress Notes (Signed)
 [facility] History and Physical   Primary Care Physician:  Mariel Shope, DO   Reason for Procedure:   History of colon polyps  Plan:    colonoscopy     HPI: [name] is a 53 y.o. female  here for colonoscopy surveillance - 5 polyps removed 12/2020, most adenomas.   Patient denies any bowel symptoms at this time.Grandfather had colon cancer. Otherwise feels well without any cardiopulmonary symptoms.   I have discussed risks / benefits of anesthesia and endoscopic procedure with [name] and they wish to proceed with the exams as outlined today.    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 Deborra Falter, LOV 03/2022 per pt   Fibroids 2023   GERD (gastroesophageal reflux disease)    takes Nexium daily   Headache(784.0)    chronic migraine, follows with [facility], Anibal Barker, NP, LOV 07/06/21 as of 06/03/22.   Hyperlipidemia    takes Atorvastatin  daily, follows with PCP Dr. Napolean Backbone.   Hypertension    takes Amlodipine  and Propranolol  daily, follows w/ PCP Dr. Napolean Backbone, lov 08/22/21 for preventative health.   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   Sleep apnea    MILD,  12/22/15 split night study in Epic   Vitamin B12 deficiency    takes supplement   Wears contact lenses    Wears glasses     Past Surgical History:  Procedure Laterality Date   BRAIN SURGERY  01/2013   aneurysm coiling   COLONOSCOPY  01/16/2021   multiple polyps   DILATION AND CURETTAGE OF UTERUS  2009   HYSTERECTOMY ABDOMINAL WITH SALPINGO-OOPHORECTOMY Left 06/24/2022   Procedure: TOTAL ABDOMINAL HYSTERECTOMY, LEFT SALPINGO-OOPHORECTOMY, RIGHT SALPINGECTOMY;  Surgeon: Woodrow Hazy, MD;  Location: [facility];  Service: Gynecology;  Laterality: Left;   IR RADIOLOGIST EVAL & MGMT  12/05/2020   IR RADIOLOGIST EVAL & MGMT  05/20/2023   LAPAROTOMY  2004   ovarian cyst   MYOMECTOMY  05/16/2011   Procedure: MYOMECTOMY;  Surgeon: Piedad Brewer;  Location: [facility];  Service: Gynecology;  Laterality: N/A;  abdominal   RADIOLOGY WITH ANESTHESIA N/A 02/18/2013   Procedure: RADIOLOGY WITH ANESTHESIA;  Surgeon: Bronson Canny, MD;  Location: [facility];  Service: Radiology;  Laterality: N/A;   RADIOLOGY WITH ANESTHESIA N/A 01/26/2014   Procedure: RADIOLOGY WITH ANESTHESIA;  Surgeon: Bronson Canny, MD;  Location: [facility];  Service: Radiology;  Laterality: N/A;   RADIOLOGY WITH ANESTHESIA N/A 07/25/2014   Procedure: RADIOLOGY WITH ANESTHESIA;  Surgeon: Bronson Canny, MD;  Location: [facility];  Service: Radiology;  Laterality: N/A;    Prior to Admission medications   Medication Sig Start Date End Date Taking? Authorizing Provider  amLODipine  (NORVASC ) 10 MG tablet Take 1 tablet (10 mg total) by mouth daily. 12/23/23  Yes Kuneff, Renee A, DO  atorvastatin  (LIPITOR ) 80 MG tablet Take 1 tablet (80 mg total) by mouth daily. 10/08/23  Yes Kuneff, Renee A, DO  baclofen (LIORESAL) 10 MG tablet Take 10 mg by mouth 3 (three) times daily as needed. 01/13/23  Yes [provider]  clonazePAM  (KLONOPIN ) 0.5 MG tablet Take 0.5-1 mg by mouth 2 (two) times daily as needed. 0.5mg  in  the mornnig, 1mg  in the evening   Yes [provider]  DULoxetine  (CYMBALTA ) 60 MG capsule Take 120 mg by mouth daily. 11/24/21  Yes [provider]  Esomeprazole Magnesium  (NEXIUM PO) Take by mouth.   Yes [provider]  fenofibrate  (TRICOR ) 145 MG tablet Take 1 tablet (145 mg total) by mouth daily. 10/08/23  Yes Kuneff, Renee A, DO  fexofenadine (ALLEGRA) 60 MG tablet Take 60 mg by mouth daily.   Yes [provider]  furosemide  (LASIX ) 20 MG tablet Take 1 tablet (20 mg total) by mouth daily as needed. 10/08/23  Yes Kuneff, Renee A, DO  ketoconazole (NIZORAL) 2 % shampoo Apply topically once a week. 10/17/20  Yes [provider]  Multiple Vitamin (MULTIVITAMIN WITH MINERALS) TABS tablet Take 1 tablet by mouth daily.   Yes [provider]  Omega-3 Fatty Acids (FISH OIL PO) Take 1 capsule by mouth daily. De3 3000 mg   Yes [provider]  prochlorperazine (COMPAZINE) 10 MG tablet Take 1 tablet by mouth every 8 (eight) hours as needed. 07/22/23  Yes [provider]  propranolol  ER (INDERAL  LA) 120 MG 24 hr capsule Take 1 capsule (120 mg total) by mouth daily. 10/08/23  Yes Kuneff, Renee A, DO  traZODone  (DESYREL ) 50 MG tablet Take 100 mg by mouth at bedtime. Takes 2   Yes [provider]  triamcinolone  (KENALOG ) 0.1 % Apply topically 2 (two) times daily. 10/17/20  Yes [provider]  vitamin B-12 (CYANOCOBALAMIN ) 100 MCG tablet Take 100 mcg by mouth daily. Pt unsure of dosage.   Yes [provider]  Fluocinolone Acetonide Scalp 0.01 % OIL Apply topically as needed. 10/17/20   [provider]  gabapentin  (NEURONTIN ) 600 MG tablet Take 600 mg by mouth 4 (four) times daily. Patient not taking: Reported on 12/23/2023 08/19/23   [provider]  ZEPBOUND  7.5 MG/0.5ML Pen Inject 7.5 mg into the skin once a week. Patient not taking: Reported on 02/16/2024 01/08/24   Mariel Shope, DO    Current  Outpatient Medications  Medication Sig Dispense Refill   amLODipine  (NORVASC ) 10 MG tablet Take 1 tablet (10 mg total) by mouth daily. 90 tablet 1   atorvastatin  (LIPITOR ) 80 MG tablet Take 1 tablet (80 mg total) by mouth daily. 90 tablet 3   baclofen (LIORESAL) 10 MG tablet Take 10 mg by mouth 3 (three) times daily as needed.     clonazePAM  (KLONOPIN ) 0.5 MG tablet Take 0.5-1 mg by mouth 2 (two) times daily as needed. 0.5mg  in the mornnig, 1mg  in the evening     DULoxetine  (CYMBALTA ) 60 MG capsule Take 120 mg by mouth daily.     Esomeprazole Magnesium  (NEXIUM PO) Take by mouth.     fenofibrate  (TRICOR ) 145 MG tablet Take 1 tablet (145 mg total) by mouth daily. 90 tablet 3  fexofenadine (ALLEGRA) 60 MG tablet Take 60 mg by mouth daily.     furosemide  (LASIX ) 20 MG tablet Take 1 tablet (20 mg total) by mouth daily as needed. 30 tablet 3   ketoconazole (NIZORAL) 2 % shampoo Apply topically once a week.     Multiple Vitamin (MULTIVITAMIN WITH MINERALS) TABS tablet Take 1 tablet by mouth daily.     Omega-3 Fatty Acids (FISH OIL PO) Take 1 capsule by mouth daily. De3 3000 mg     prochlorperazine (COMPAZINE) 10 MG tablet Take 1 tablet by mouth every 8 (eight) hours as needed.     propranolol  ER (INDERAL  LA) 120 MG 24 hr capsule Take 1 capsule (120 mg total) by mouth daily. 90 capsule 1   traZODone  (DESYREL ) 50 MG tablet Take 100 mg by mouth at bedtime. Takes 2     triamcinolone  (KENALOG ) 0.1 % Apply topically 2 (two) times daily.     vitamin B-12 (CYANOCOBALAMIN ) 100 MCG tablet Take 100 mcg by mouth daily. Pt unsure of dosage.     Fluocinolone Acetonide Scalp 0.01 % OIL Apply topically as needed.     gabapentin  (NEURONTIN ) 600 MG tablet Take 600 mg by mouth 4 (four) times daily. (Patient not taking: Reported on 12/23/2023)     ZEPBOUND  7.5 MG/0.5ML Pen Inject 7.5 mg into the skin once a week. (Patient not taking: Reported on 02/16/2024) 2 mL 5   Current Facility-Administered Medications  Medication  Dose Route Frequency Provider Last Rate Last Admin   0.9 %  sodium chloride  infusion  500 mL Intravenous Once Ruthe Roemer, Lendon Queen, MD        Allergies as of 02/16/2024 - Review Complete 02/16/2024  Allergen Reaction Noted   Ace inhibitors Swelling and Other (See Comments) 01/12/2014   Morphine  Itching 06/24/2022    Family History  Problem Relation Age of Onset   Heart disease Mother 53       cad   Lupus Mother    COPD Mother    Arthritis Mother        rheumatoid   AVM Mother    Diabetes Mother    Bipolar disorder Mother    Drug abuse Mother    Fibromyalgia Mother    Colon polyps Mother    Diabetes Father    Cancer Father        thyroid , lung   Depression Sister    Diabetes Sister    Hyperlipidemia Sister    Hypertension Sister    Cancer Paternal Grandfather        colon   Colon cancer Paternal Grandfather    Alcohol abuse Paternal Uncle    Drug abuse Maternal Grandfather    Alcohol abuse Maternal Grandfather    Esophageal cancer Neg Hx    Stomach cancer Neg Hx    Rectal cancer Neg Hx     Social History   Socioeconomic History   Marital status: Married    Spouse name: Not on file   Number of children: 0   Years of education: 16   Highest education level: Not on file  Occupational History    Employer: [employer]  Tobacco Use   Smoking status: Former    Current packs/day: 0.00    Types: Cigarettes    Start date: 2020    Quit date: 2022    Years since quitting: 3.4   Smokeless tobacco: Never   Tobacco comments:    Only smoked occasionally for about 2 years.  Vaping Use  Vaping status: Never Used  Substance and Sexual Activity   Alcohol use: Yes    Alcohol/week: 5.0 standard drinks of alcohol    Types: 5 Glasses of wine per week   Drug use: No   Sexual activity: Yes    Birth control/protection: I.U.D.  Other Topics Concern   Not on file  Social History Narrative   Lives with husband   Caffeine- 2-12 oz cans daily   Social Drivers of  Health   Financial Resource Strain: Low Risk  (12/12/2022)   Received from [facility]   Overall Financial Resource Strain (CARDIA)    Difficulty of Paying Living Expenses: Not very hard  Food Insecurity: No Food Insecurity (12/12/2022)   Received from [facility]   Hunger Vital Sign    Within the past 12 months, you worried that your food would run out before you got the money to buy more.: Never true    Within the past 12 months, the food you bought just didn't last and you didn't have money to get more.: Never true  Transportation Needs: No Transportation Needs (12/12/2022)   Received from [facility] - Transportation    Lack of Transportation (Medical): No    Lack of Transportation (Non-Medical): No  Physical Activity: Insufficiently Active (07/05/2021)   Received from [facility]   Exercise Vital Sign    On average, how many days per week do you engage in moderate to strenuous exercise (like a brisk walk)?: 1 day    On average, how many minutes do you engage in exercise at this level?: 30 min  Stress: Stress Concern Present (07/05/2021)   Received from [facility] - Occupational Stress Questionnaire    Feeling of Stress : To some extent  Social Connections: Unknown (01/03/2022)   Received from [facility]   Social Network    Social Network: Not on file  Intimate Partner Violence: Unknown (12/03/2021)   Received from [facility]   HITS    Physically Hurt: Not on file    Insult or Talk Down To: Not on file    Threaten Physical Harm: Not on file    Scream or Curse: Not on file    Review of Systems: All other review of systems negative except as mentioned in the HPI.  Physical Exam: Vital signs BP (!) 134/103   Pulse 80   Temp (!) 97 F (36.1 C) (Temporal)   Resp 11   Ht 5' 5.5 (1.664 m)   Wt 255 lb (115.7 kg)   LMP 06/09/2019 (Approximate)   SpO2 94%   BMI 41.79 kg/m   General:   Alert,   Well-developed, pleasant and cooperative in NAD Lungs:  Clear throughout to auscultation.   Heart:  Regular rate and rhythm Abdomen:  Soft, nontender and nondistended.   Neuro/Psych:  Alert and cooperative. Normal mood and affect. A and O x 3  Christi Coward, MD [facility]

## 2024-02-16 NOTE — Progress Notes (Signed)
 Pt resting comfortably. VSS. Airway intact. SBAR complete to RN. All questions answered.

## 2024-02-16 NOTE — Progress Notes (Signed)
VS completed by DT.  Pt's states no medical or surgical changes since previsit or office visit.  

## 2024-02-16 NOTE — Patient Instructions (Signed)
 Handout on polyps and hemorrhoids given.  Await pathology results  Repeat colonoscopy for surveillance will be determined based off of pathology results  Continue present medications and resume previous diet    YOU HAD AN ENDOSCOPIC PROCEDURE TODAY AT THE [facility]:   Refer to the procedure report that was given to you for any specific questions about what was found during the examination.  If the procedure report does not answer your questions, please call your gastroenterologist to clarify.  If you requested that your care partner not be given the details of your procedure findings, then the procedure report has been included in a sealed envelope for you to review at your convenience later.  YOU SHOULD EXPECT: Some feelings of bloating in the abdomen. Passage of more gas than usual.  Walking can help get rid of the air that was put into your GI tract during the procedure and reduce the bloating. If you had a lower endoscopy (such as a colonoscopy or flexible sigmoidoscopy) you may notice spotting of blood in your stool or on the toilet paper. If you underwent a bowel prep for your procedure, you may not have a normal bowel movement for a few days.  Please Note:  You might notice some irritation and congestion in your nose or some drainage.  This is from the oxygen used during your procedure.  There is no need for concern and it should clear up in a day or so.  SYMPTOMS TO REPORT IMMEDIATELY:  Following lower endoscopy (colonoscopy or flexible sigmoidoscopy):  Excessive amounts of blood in the stool  Significant tenderness or worsening of abdominal pains  Swelling of the abdomen that is new, acute  Fever of 100F or higher  For urgent or emergent issues, a gastroenterologist can be reached at any hour by calling [phone]. Do not use MyChart messaging for urgent concerns.    DIET:  We do recommend a small meal at first, but then you may proceed to your regular diet.   Drink plenty of fluids but you should avoid alcoholic beverages for 24 hours.  ACTIVITY:  You should plan to take it easy for the rest of today and you should NOT DRIVE or use heavy machinery until tomorrow (because of the sedation medicines used during the test).    FOLLOW UP: Our staff will call the number listed on your records the next business day following your procedure.  We will call around 7:15- 8:00 am to check on you and address any questions or concerns that you may have regarding the information given to you following your procedure. If we do not reach you, we will leave a message.     If any biopsies were taken you will be contacted by phone or by letter within the next 1-3 weeks.  Please call us  at [phone] if you have not heard about the biopsies in 3 weeks.    SIGNATURES/CONFIDENTIALITY: You and/or your care partner have signed paperwork which will be entered into your electronic medical record.  These signatures attest to the fact that that the information above on your After Visit Summary has been reviewed and is understood.  Full responsibility of the confidentiality of this discharge information lies with you and/or your care-partner.

## 2024-02-16 NOTE — Progress Notes (Signed)
 Called to room to assist during endoscopic procedure.  Patient ID and intended procedure confirmed with present staff. Received instructions for my participation in the procedure from the performing physician.

## 2024-02-17 ENCOUNTER — Telehealth: Payer: Self-pay

## 2024-02-17 ENCOUNTER — Telehealth: Payer: Self-pay | Admitting: Lactation Services

## 2024-02-17 NOTE — Telephone Encounter (Signed)
  Follow up Call-     02/16/2024   11:03 AM  Call back number  Post procedure Call Back phone  # [phone]  Permission to leave phone message Yes     Patient questions:  Do you have a fever, pain , or abdominal swelling? No. Pain Score  0 *  Have you tolerated food without any problems? Yes.    Have you been able to return to your normal activities? Yes.    Do you have any questions about your discharge instructions: Diet   No. Medications  No. Follow up visit  No.  Do you have questions or concerns about your Care? No.  Actions: * If pain score is 4 or above: No action needed, pain <4.

## 2024-02-17 NOTE — Telephone Encounter (Signed)
 Called patient back and she explained that she was still having abdomen pains in the lower left quadrant and passing liquid stools with minimal solid pieces. I explained that BMs can take several days to return to normal. I also advised her to walk around some, try some warm fluids or take an OTC gas ex for her gas pains. She understood and had no further questions.

## 2024-02-17 NOTE — Telephone Encounter (Signed)
 Inbound call from patient requesting f/u call states she is having abdominal pain on the bottom left side.   Please advise.

## 2024-02-17 NOTE — Telephone Encounter (Signed)
 Thanks Jacqlyn Matas, agree with your recommendations. No high risk interventions performed during colonoscopy. Hopefully mild discomfort / gas pains. If symptoms worsen, severe pains, fevers, etc, she needs to call us  back. Thanks

## 2024-02-18 ENCOUNTER — Other Ambulatory Visit (HOSPITAL_COMMUNITY): Payer: Self-pay

## 2024-02-18 ENCOUNTER — Telehealth: Payer: Self-pay

## 2024-02-18 NOTE — Telephone Encounter (Signed)
 Please see encounters 12/23/23, 01/09/24, 01/27/24

## 2024-02-19 LAB — SURGICAL PATHOLOGY

## 2024-02-20 ENCOUNTER — Ambulatory Visit: Payer: Self-pay | Admitting: Gastroenterology

## 2024-02-27 ENCOUNTER — Encounter (HOSPITAL_COMMUNITY): Payer: Self-pay | Admitting: Interventional Radiology

## 2024-03-03 ENCOUNTER — Telehealth (HOSPITAL_COMMUNITY): Payer: Self-pay

## 2024-03-03 NOTE — Telephone Encounter (Signed)
-----   Message from [facility] GORMAN LAMP sent at 03/03/2024  2:52 PM EDT ----- Regarding: Dev F/U MRA in 2 years

## 2024-03-03 NOTE — Telephone Encounter (Signed)
 Pt is aware that Dr. Dolphus is leaving. She will reach out to PCP for 2 year f/u imaging. AB

## 2024-03-09 ENCOUNTER — Ambulatory Visit: Admitting: Family Medicine

## 2024-03-19 ENCOUNTER — Ambulatory Visit (INDEPENDENT_AMBULATORY_CARE_PROVIDER_SITE_OTHER): Admitting: Family Medicine

## 2024-03-19 VITALS — BP 108/77 | HR 87 | Wt 237.0 lb

## 2024-03-19 DIAGNOSIS — R7303 Prediabetes: Secondary | ICD-10-CM | POA: Diagnosis not present

## 2024-03-19 LAB — POCT GLYCOSYLATED HEMOGLOBIN (HGB A1C)
HbA1c POC (<> result, manual entry): 5 % (ref 4.0–5.6)
HbA1c, POC (controlled diabetic range): 5 % (ref 0.0–7.0)
HbA1c, POC (prediabetic range): 5 % — AB (ref 5.7–6.4)
Hemoglobin A1C: 5 % (ref 4.0–5.6)

## 2024-03-19 MED ORDER — FLUTICASONE PROPIONATE 50 MCG/ACT NA SUSP: 2.0000 | Freq: Every day | NASAL | 11 refills | Status: AC

## 2024-03-19 MED ORDER — PROPRANOLOL HCL ER 120 MG PO CP24: 120.0000 mg | ORAL_CAPSULE | Freq: Every day | ORAL | 1 refills | Status: AC

## 2024-03-19 MED ORDER — AMLODIPINE BESYLATE 10 MG PO TABS: 10.0000 mg | ORAL_TABLET | Freq: Every day | ORAL | 1 refills | Status: AC

## 2024-03-19 NOTE — Patient Instructions (Addendum)

## 2024-03-19 NOTE — Progress Notes (Signed)
 Patient ID: [name], female  DOB: 12/21/1970, 53 y.o.   MRN: [id number] Patient Care Team    Relationship Specialty Notifications Start End  Catherine Charlies LABOR, DO PCP - General Family Medicine  05/01/15   Barbaraann Sonny BROCKS  Nurse Practitioner  12/26/15   Johnnye Ade, MD Consulting Physician Obstetrics and Gynecology  09/13/16   Vincente Murrain, MD Consulting Physician Psychiatry  12/21/19   Armbruster, Elspeth SQUIBB, MD Consulting Physician Gastroenterology  08/22/21     Chief Complaint  Patient presents with   Medical Management of Chronic Issues    11 week follow up. Pt is fasting    Subjective: [name] is a 53 y.o.  Female  present for chronic condition management appt All past medical history, surgical history, allergies, family history, immunizations, medications and social history were updated  in the electronic medical record today. All recent labs, ED visits and hospitalizations within the last year were reviewed.  Hypertension/Hyperlipidemia/LE edema:   She reports compliance with her Inderal  LA 120, Lipitor , tricor  and amlodipine  10.  She has started the lasix  20 mg QD PRN-not taking often, with only mild improvement in LE edema.  Patient denies chest pain, shortness of breath, dizziness or lower extremity edema.  Pt is  prescribed statin and tricor ,  taking fish oil 3600 mg daily.  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  Diet: low salt  Exercise: exercising routinely RF: HLD, stable coiled aneurysm, HTN, Fhx  Weight: 275>267> 262>250>247>248 > 253 lbs>237 BMI: 45.2> 43.7> 43.13>41.19>40.8>40.6> 41.49>38.84 Diet: Making better options getting used to low glycemic index diet Calories: 1300-1400- thinking about using NOOM to assist with calorie counting portions Exercise:working on more frequent. stopped Water:120 Barriers: Meal prepping  Prior note: Patient reports today that she has lost  weight, however that was mostly secondary to the recent gastrointestinal illness.  She has started exercising and has started watching her diet.  She has not fully committed to either exercise or diet as suggested, but she just came back from vacation and plans to start ASAP. Prior note Patient reports she has not been watching her diet or exercising routinely. She is ready to get started on a lifestyle change/plan. A1c was 6.3 and FBG was elevated in diabetic range x1, HTN, HLD with higher risk mental health medications that cause weight gain as side effects.      03/19/2024    8:26 AM 12/23/2023    1:59 PM 10/08/2023    2:09 PM 06/06/2023    7:53 AM 02/28/2023    8:23 AM  Depression screen PHQ 2/9  Decreased Interest 1 1 1 1 1   Down, Depressed, Hopeless 1 1 1 1 1   PHQ - 2 Score 2 2 2 2 2   Altered sleeping 1 0 1 0 0  Tired, decreased energy 1 1 0 1 2  Change in appetite 0 0 1 1 1   Feeling bad or failure about yourself  0 1 1 1  0  Trouble concentrating 0 0 0 1 0  Moving slowly or fidgety/restless 0 0 0 0 0  Suicidal thoughts 0 0 0 0 0  PHQ-9 Score 4 4 5 6 5   Difficult doing work/chores Not difficult at all Somewhat difficult Somewhat difficult Not difficult at all Somewhat difficult      03/19/2024    8:26 AM 12/23/2023    2:00 PM 10/08/2023    2:09 PM 02/28/2023  8:24 AM  GAD 7 : Generalized Anxiety Score  Nervous, Anxious, on Edge 1 1 1 2   Control/stop worrying 1 1 0 1  Worry too much - different things 1 1 1 1   Trouble relaxing 0 0 1 0  Restless 0 0 0 0  Easily annoyed or irritable 1 0 0 0  Afraid - awful might happen 0 1 0 1  Total GAD 7 Score 4 4 3 5   Anxiety Difficulty Somewhat difficult Somewhat difficult Somewhat difficult Somewhat difficult    Immunization History  Administered Date(s) Administered   Influenza Split 05/18/2011   Influenza, Seasonal, Injecte, Preservative Fre 06/06/2023   Influenza,inj,Quad PF,6+ Mos 07/09/2013, 05/04/2014, 05/11/2015, 05/29/2018,  06/14/2022   Influenza-Unspecified 06/20/2017, 05/14/2019, 05/12/2020, 05/11/2021   PFIZER(Purple Top)SARS-COV-2 Vaccination 11/29/2019, 12/24/2019, 08/04/2020   Td 09/02/2005   Tdap 09/13/2016   Zoster Recombinant(Shingrix ) 10/08/2023, 12/23/2023     Past Medical History:  Diagnosis Date   Anxiety    takes Clonazepam  daily as needed   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 Vincente, LOV 03/2022 per pt   Fibroids 2023   GERD (gastroesophageal reflux disease)    takes Nexium daily   Headache(784.0)    chronic migraine, follows with [facility], Jhonny Ahle, NP, LOV 07/06/21 as of 06/03/22.   Hyperlipidemia    takes Atorvastatin  daily, follows with PCP Dr. Charlies Bellini.   Hypertension    takes Amlodipine  and Propranolol  daily, follows w/ PCP Dr. Charlies Bellini, lov 08/22/21 for preventative health.   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 Charlies Bellini, takes Lasix  prn   Parotiditis 11/16/2021   PONV (postoperative nausea and vomiting)    Pre-diabetes 02/12/2022   Hgb A1C 5.9   Precancerous skin lesion    left under arm   S/P abdominal supracervical subtotal hysterectomy 06/24/2022   SAH (subarachnoid hemorrhage) (HCC) 2014   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   Sleep apnea    MILD, 12/22/15 split night study in Epic   Vitamin B12 deficiency    takes supplement   Wears contact lenses    Wears glasses    Allergies  Allergen Reactions   Ace Inhibitors Swelling and Other (See Comments)    Angioedema    Morphine  Itching   Past Surgical History:  Procedure Laterality Date   BRAIN SURGERY  01/2013   aneurysm coiling    COLONOSCOPY  01/16/2021   multiple polyps   DILATION AND CURETTAGE OF UTERUS  2009   HYSTERECTOMY ABDOMINAL WITH SALPINGO-OOPHORECTOMY Left 06/24/2022   Procedure: TOTAL ABDOMINAL HYSTERECTOMY, LEFT SALPINGO-OOPHORECTOMY, RIGHT SALPINGECTOMY;  Surgeon: Johnnye Ade, MD;  Location: [facility];  Service: Gynecology;  Laterality: Left;   IR RADIOLOGIST EVAL & MGMT  12/05/2020   IR RADIOLOGIST EVAL & MGMT  05/20/2023   LAPAROTOMY  2004   ovarian cyst   MYOMECTOMY  05/16/2011   Procedure: MYOMECTOMY;  Surgeon: Ade CHRISTELLA Johnnye;  Location: [facility];  Service: Gynecology;  Laterality: N/A;  abdominal   RADIOLOGY WITH ANESTHESIA N/A 02/18/2013   Procedure: RADIOLOGY WITH ANESTHESIA;  Surgeon: Thyra  MARLA Nash, MD;  Location: [facility];  Service: Radiology;  Laterality: N/A;   RADIOLOGY WITH ANESTHESIA N/A 01/26/2014   Procedure: RADIOLOGY WITH ANESTHESIA;  Surgeon: Thyra MARLA Nash, MD;  Location: [facility];  Service: Radiology;  Laterality: N/A;   RADIOLOGY WITH ANESTHESIA N/A 07/25/2014   Procedure: RADIOLOGY WITH ANESTHESIA;  Surgeon: Thyra MARLA Nash, MD;  Location: [facility];  Service: Radiology;  Laterality: N/A;   Family History  Problem Relation Age of Onset   Heart disease Mother 14       cad   Lupus Mother    COPD Mother    Arthritis Mother        rheumatoid   AVM Mother    Diabetes Mother    Bipolar disorder Mother    Drug abuse Mother    Fibromyalgia Mother    Colon polyps Mother    Diabetes Father    Cancer Father        thyroid , lung   Depression Sister    Diabetes Sister    Hyperlipidemia Sister    Hypertension Sister    Cancer Paternal Grandfather        colon   Colon cancer Paternal Grandfather    Alcohol abuse Paternal Uncle    Drug abuse Maternal Grandfather    Alcohol abuse Maternal Grandfather    Esophageal cancer Neg Hx    Stomach cancer Neg Hx    Rectal cancer Neg Hx    Social History   Social History Narrative   Lives with husband    Caffeine- 2-12 oz cans daily    Allergies as of 03/19/2024       Reactions   Ace Inhibitors Swelling, Other (See Comments)   Angioedema    Morphine  Itching        Medication List        Accurate as of March 19, 2024  8:43 AM. If you have any questions, ask your nurse or doctor.          STOP taking these medications    gabapentin  600 MG tablet Commonly known as: NEURONTIN    Zepbound  7.5 MG/0.5ML Pen Generic drug: tirzepatide        TAKE these medications    amLODipine  10 MG tablet Commonly known as: NORVASC  Take 1 tablet (10 mg total) by mouth daily.   atorvastatin  80 MG tablet Commonly known as: LIPITOR  Take 1 tablet (80 mg total) by mouth daily.   baclofen 10 MG tablet Commonly known as: LIORESAL Take 10 mg by mouth 3 (three) times daily as needed.   clonazePAM  0.5 MG tablet Commonly known as: KLONOPIN  Take 0.5-1 mg by mouth 2 (two) times daily as needed. 0.5mg  in the mornnig, 1mg  in the evening   DULoxetine  60 MG capsule Commonly known as: CYMBALTA  Take 120 mg by mouth daily. What changed:  how much to take additional instructions   fenofibrate  145 MG tablet Commonly known as: TRICOR  Take 1 tablet (145 mg total) by mouth daily.   fexofenadine 60 MG tablet Commonly known as: ALLEGRA Take 60 mg by mouth daily.   FISH OIL PO Take 1 capsule by mouth daily. De3 3000 mg   Fluocinolone Acetonide Scalp 0.01 % Oil Apply topically as needed.   fluticasone  50 MCG/ACT nasal spray Commonly known as: FLONASE  Place 2 sprays into both nostrils daily.   furosemide  20 MG tablet Commonly known as: LASIX  Take 1 tablet (20 mg total) by mouth daily as needed.   ketoconazole 2 % shampoo Commonly known  as: NIZORAL Apply topically once a week.   multivitamin with minerals Tabs tablet Take 1 tablet by mouth daily.   NEXIUM PO Take by mouth.   prochlorperazine 10 MG tablet Commonly known as: COMPAZINE Take 1 tablet by mouth every 8 (eight) hours as  needed.   propranolol  ER 120 MG 24 hr capsule Commonly known as: INDERAL  LA Take 1 capsule (120 mg total) by mouth daily.   traZODone  50 MG tablet Commonly known as: DESYREL  Take 100 mg by mouth at bedtime. Takes 2   triamcinolone  cream 0.1 % Commonly known as: KENALOG  Apply topically 2 (two) times daily.   vitamin B-12 100 MCG tablet Commonly known as: CYANOCOBALAMIN  Take 100 mcg by mouth daily. Pt unsure of dosage.        All past medical history, surgical history, allergies, family history, immunizations andmedications were updated in the EMR today and reviewed under the history and medication portions of their EMR.       No results found.   ROS 14 pt review of systems performed and negative (unless mentioned in an HPI)  Objective: BP 108/77   Pulse 87   Wt 237 lb (107.5 kg)   LMP 06/09/2019 (Approximate)   SpO2 97%   BMI 38.84 kg/m  Physical Exam Vitals and nursing note reviewed.  Constitutional:      General: She is not in acute distress.    Appearance: Normal appearance. She is not ill-appearing, toxic-appearing or diaphoretic.  HENT:     Head: Normocephalic and atraumatic.  Eyes:     General: No scleral icterus.       Right eye: No discharge.        Left eye: No discharge.     Extraocular Movements: Extraocular movements intact.     Conjunctiva/sclera: Conjunctivae normal.     Pupils: Pupils are equal, round, and reactive to light.  Cardiovascular:     Rate and Rhythm: Normal rate and regular rhythm.  Pulmonary:     Effort: Pulmonary effort is normal. No respiratory distress.     Breath sounds: Normal breath sounds. No wheezing, rhonchi or rales.  Musculoskeletal:     Right lower leg: No edema.     Left lower leg: No edema.  Skin:    General: Skin is warm.     Findings: No rash.  Neurological:     Mental Status: She is alert and oriented to person, place, and time. Mental status is at baseline.     Motor: No weakness.     Gait: Gait normal.   Psychiatric:        Mood and Affect: Mood normal.        Behavior: Behavior normal.        Thought Content: Thought content normal.        Judgment: Judgment normal.      No results found.  Assessment/plan: [name] is a 53 y.o. female present for Chronic Conditions/illness Management Essential hypertension/HLD/morbid obesity Anterior cerebral artery aneurysm/extremity edema Stable Continue amlodipine  10  Continue  propranolol  120 mg Continue low sodium diet. Diet and exercise modifications discussed today.  Continue  Lipitor  80 mg daily Continue Lasix  20 mg daily as needed. Rarely needs.  -Use compression stockings.  Weight loss can also be helpful with lower extremity edema that she has been focusing on dietary changes and exercise. - make certain fish oil supplement is 3-4g daily. Continue fenofibrate  Continue follow ups with IR for aneurysm> stable.    Depression with  anxiety Managed by psychiatry team  Stage 3a chronic kidney disease (HCC) Continues to improve over time since lithium was stopped.  Continue to monitor yearly UTD 10/2023  Morbid obesity (HCC)//HTN/Prediabetes Patient has been  counseled on exercise, calorie counting, weight loss and potential medications to help with weight loss  -Patient has been  provided with online resources for: Weekly net calorie calculator.  Applications for calorie counting.  Patient was advised to ensure she is taking in adequate nutrition daily by meeting calorie goals. -Patient has been educated on dietary changes to not only lose weight but to eat healthy.   Patient has been  educated on glycemic index. -Patient was educated on exercise goal of 150 minutes a week (plus warm up and cool down) of cardiovascular exercise.  Patient was educated on heart rate for cardiovascular and fat burning zones. -Patient was encouraged to maintain adequate water consumption of at least 120 ounces a day, more if  exercising/sweating. Fasting glucose 134, A1c 6.3> start med> 6.1>5.3 (02/28/2023)> 5.5> 5.6>wegovy  stopped- mounjaro  not covered- zepbound  covered but expensive> started zepbound  through [facility] > 5.0 collected today Goals: Meal prep.  Calorie count 1300-1400 cal a day.  Increase exercise  Follow-up in 12 weeks    Return in about 29 weeks (around 10/08/2024) for cpe (20 min), Routine chronic condition follow-up.  Orders Placed This Encounter  Procedures   POCT HgB A1C   Meds ordered this encounter  Medications   amLODipine  (NORVASC ) 10 MG tablet    Sig: Take 1 tablet (10 mg total) by mouth daily.    Dispense:  90 tablet    Refill:  1   propranolol  ER (INDERAL  LA) 120 MG 24 hr capsule    Sig: Take 1 capsule (120 mg total) by mouth daily.    Dispense:  90 capsule    Refill:  1   fluticasone  (FLONASE ) 50 MCG/ACT nasal spray    Sig: Place 2 sprays into both nostrils daily.    Dispense:  16 g    Refill:  11   Referral Orders  No referral(s) requested today     Electronically signed by: Charlies Bellini, DO [facility] [place]

## 2024-06-23 ENCOUNTER — Emergency Department (HOSPITAL_COMMUNITY)

## 2024-06-23 ENCOUNTER — Emergency Department (HOSPITAL_COMMUNITY)
Admission: EM | Admit: 2024-06-23 | Discharge: 2024-06-23 | Disposition: A | Discharge status: Home / Self Care | Attending: Emergency Medicine | Admitting: Emergency Medicine

## 2024-06-23 DIAGNOSIS — Z79899 Other long term (current) drug therapy: Secondary | ICD-10-CM | POA: Insufficient documentation

## 2024-06-23 DIAGNOSIS — M62838 Other muscle spasm: Secondary | ICD-10-CM | POA: Diagnosis not present

## 2024-06-23 DIAGNOSIS — R0789 Other chest pain: Secondary | ICD-10-CM | POA: Diagnosis not present

## 2024-06-23 DIAGNOSIS — I129 Hypertensive chronic kidney disease with stage 1 through stage 4 chronic kidney disease, or unspecified chronic kidney disease: Secondary | ICD-10-CM | POA: Insufficient documentation

## 2024-06-23 DIAGNOSIS — S80212A Abrasion, left knee, initial encounter: Secondary | ICD-10-CM | POA: Insufficient documentation

## 2024-06-23 DIAGNOSIS — Y92410 Unspecified street and highway as the place of occurrence of the external cause: Secondary | ICD-10-CM | POA: Insufficient documentation

## 2024-06-23 DIAGNOSIS — N189 Chronic kidney disease, unspecified: Secondary | ICD-10-CM | POA: Diagnosis not present

## 2024-06-23 DIAGNOSIS — M542 Cervicalgia: Secondary | ICD-10-CM | POA: Insufficient documentation

## 2024-06-23 DIAGNOSIS — S60511A Abrasion of right hand, initial encounter: Secondary | ICD-10-CM | POA: Diagnosis not present

## 2024-06-23 DIAGNOSIS — S0081XA Abrasion of other part of head, initial encounter: Secondary | ICD-10-CM | POA: Insufficient documentation

## 2024-06-23 DIAGNOSIS — S80211A Abrasion, right knee, initial encounter: Secondary | ICD-10-CM | POA: Insufficient documentation

## 2024-06-23 DIAGNOSIS — S0990XA Unspecified injury of head, initial encounter: Secondary | ICD-10-CM | POA: Diagnosis present

## 2024-06-23 MED ORDER — LIDOCAINE 5 % EX PTCH: 1.0000 | MEDICATED_PATCH | CUTANEOUS | 0 refills | Status: AC

## 2024-06-23 MED ORDER — CYCLOBENZAPRINE HCL 10 MG PO TABS: 10.0000 mg | ORAL_TABLET | Freq: Two times a day (BID) | ORAL | 0 refills | Status: AC | PRN

## 2024-06-23 NOTE — Discharge Instructions (Addendum)
 Your history, exam, workup today did not reveal significant traumatic injuries.  I suspect your injuries are mainly soft tissue and musculoskeletal and you did have some muscle spasm.  We feel you are safe for discharge home given the reassuring CTs and x-rays.  Please use the numbing patches and muscle relaxant and follow-up with your primary doctor.  If any symptoms change or worsen acutely, please return to the nearest emergency department.  Please rest and stay hydrated.
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
## 2024-08-09 ENCOUNTER — Other Ambulatory Visit: Payer: Self-pay | Admitting: Family Medicine

## 2024-09-13 ENCOUNTER — Ambulatory Visit: Payer: Self-pay

## 2024-09-13 ENCOUNTER — Encounter: Payer: Self-pay | Admitting: Sports Medicine

## 2024-09-13 ENCOUNTER — Ambulatory Visit: Admitting: Sports Medicine

## 2024-09-13 VITALS — BP 128/70 | HR 75 | Temp 97.8°F | Wt 203.0 lb

## 2024-09-13 DIAGNOSIS — J0100 Acute maxillary sinusitis, unspecified: Secondary | ICD-10-CM

## 2024-09-13 DIAGNOSIS — R051 Acute cough: Secondary | ICD-10-CM | POA: Diagnosis not present

## 2024-09-13 DIAGNOSIS — R5383 Other fatigue: Secondary | ICD-10-CM | POA: Diagnosis not present

## 2024-09-13 DIAGNOSIS — R300 Dysuria: Secondary | ICD-10-CM

## 2024-09-13 LAB — CBC WITH DIFFERENTIAL/PLATELET
Basophils Absolute: 0 K/uL (ref 0.0–0.1)
Basophils Relative: 0.4 % (ref 0.0–3.0)
Eosinophils Absolute: 0.1 K/uL (ref 0.0–0.7)
Eosinophils Relative: 1.2 % (ref 0.0–5.0)
HCT: 40.4 % (ref 36.0–46.0)
Hemoglobin: 13.6 g/dL (ref 12.0–15.0)
Lymphocytes Relative: 36.6 % (ref 12.0–46.0)
Lymphs Abs: 2.9 K/uL (ref 0.7–4.0)
MCHC: 33.7 g/dL (ref 30.0–36.0)
MCV: 91.3 fl (ref 78.0–100.0)
Monocytes Absolute: 0.5 K/uL (ref 0.1–1.0)
Monocytes Relative: 5.8 % (ref 3.0–12.0)
Neutro Abs: 4.5 K/uL (ref 1.4–7.7)
Neutrophils Relative %: 56 % (ref 43.0–77.0)
Platelets: 415 K/uL — ABNORMAL HIGH (ref 150.0–400.0)
RBC: 4.42 Mil/uL (ref 3.87–5.11)
RDW: 12.7 % (ref 11.5–15.5)
WBC: 8 K/uL (ref 4.0–10.5)

## 2024-09-13 LAB — BASIC METABOLIC PANEL WITH GFR
BUN: 13 mg/dL (ref 6–23)
CO2: 28 meq/L (ref 19–32)
Calcium: 9.8 mg/dL (ref 8.4–10.5)
Chloride: 102 meq/L (ref 96–112)
Creatinine, Ser: 1.07 mg/dL (ref 0.40–1.20)
GFR: 60.32 mL/min
Glucose, Bld: 87 mg/dL (ref 70–99)
Potassium: 3.8 meq/L (ref 3.5–5.1)
Sodium: 137 meq/L (ref 135–145)

## 2024-09-13 LAB — POC URINALSYSI DIPSTICK (AUTOMATED)
Bilirubin, UA: NEGATIVE
Blood, UA: NEGATIVE
Glucose, UA: NEGATIVE
Ketones, UA: NEGATIVE
Leukocytes, UA: NEGATIVE
Nitrite, UA: NEGATIVE
Protein, UA: NEGATIVE
Spec Grav, UA: <=1.005 — AB
Urobilinogen, UA: 0.2 U/dL
pH, UA: 6

## 2024-09-13 MED ORDER — AMOXICILLIN-POT CLAVULANATE 500-125 MG PO TABS: 1.0000 | ORAL_TABLET | Freq: Three times a day (TID) | ORAL | 0 refills | Status: DC

## 2024-09-13 MED ORDER — AMOXICILLIN-POT CLAVULANATE 500-125 MG PO TABS: 1.0000 | ORAL_TABLET | Freq: Two times a day (BID) | ORAL | 0 refills | Status: AC

## 2024-09-13 NOTE — Progress Notes (Signed)
 "  Careteam: Patient Care Team: Catherine Charlies LABOR, DO as PCP - General (Family Medicine) O'Neal, Sonny BROCKS (Nurse Practitioner) Johnnye Ade, MD as Consulting Physician (Obstetrics and Gynecology) Vincente Murrain, MD as Consulting Physician (Psychiatry) Armbruster, Elspeth SQUIBB, MD as Consulting Physician (Gastroenterology)  Allergies[1]  Chief Complaint  Patient presents with   Nasal Congestion    Pt has been having cough and congestion since  new years along with fatigue. She tested negative for Covid.  She was seen virtually and was prescribed Flonase  and tessalon  pearls.    Discussed the use of AI scribe software for clinical note transcription with the patient, who gave verbal consent to proceed.  History of Present Illness  [name] is a 54 year old female who presents with persistent cough, congestion, and fatigue.  Symptoms began on January 2nd with a fever that lasted a few days, followed by significant fatigue. She feels very lethargic, going to work and then coming home to sleep. She continues to experience body and muscle aches, as well as headaches localized to the sinus area.  She reports nasal and chest congestion, along with a productive cough that has persisted for nearly two weeks. The phlegm varies in color from normal to yellow to brownish. Breathing feels slightly labored with exertion. She also mentions a sore throat and mild sinus pain, particularly in the frontal sinus area, but no difficulty swallowing. There is a sensation of wetness in her right ear but no drainage.  She has experienced on and off nausea over the past two weeks but no vomiting or abdominal pain. She reports a burning sensation during urination for the past couple of days but no increased frequency or blood in the urine.  No current fever, blurring or double vision, dizziness, lightheadedness, vomiting, loose stools, or vaginal discharge. She confirms having eaten a protein bar for  breakfast.    Review of Systems:  Review of Systems  Constitutional:  Positive for malaise/fatigue.  HENT:  Positive for congestion, ear pain and sinus pain.   Eyes:  Negative for blurred vision and double vision.  Respiratory:  Positive for cough and sputum production. Negative for shortness of breath.   Cardiovascular:  Negative for chest pain, palpitations and leg swelling.  Gastrointestinal:  Positive for nausea. Negative for abdominal pain, blood in stool, constipation, diarrhea and vomiting.  Genitourinary:  Positive for dysuria. Negative for frequency, hematuria and urgency.  Musculoskeletal:  Positive for myalgias.  Neurological:  Positive for headaches. Negative for dizziness.   Negative unless indicated in HPI.   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 Vincente, LOV 03/2022 per pt   Fibroids 2023   GERD (gastroesophageal reflux  disease)    takes Nexium daily   Headache(784.0)    chronic migraine, follows with [facility], Jhonny Ahle, NP, LOV 07/06/21 as of 06/03/22.   Hyperlipidemia    takes Atorvastatin  daily, follows with PCP Dr. Charlies Bellini.   Hypertension    takes Amlodipine  and Propranolol   daily, follows w/ PCP Dr. Charlies Bellini, lov 08/22/21 for preventative health.   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 Charlies Bellini, takes Lasix  prn   Parotiditis 11/16/2021   PONV (postoperative nausea and vomiting)    Pre-diabetes 02/12/2022   Hgb A1C 5.9   Precancerous skin lesion    left under arm   S/P abdominal supracervical subtotal hysterectomy 06/24/2022   SAH (subarachnoid hemorrhage) (HCC) 2014   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   Sleep apnea    MILD, 12/22/15 split night study in Epic   Vitamin B12 deficiency    takes supplement   Wears contact lenses    Wears glasses    Past Surgical History:  Procedure Laterality Date   BRAIN SURGERY  01/2013   aneurysm coiling   COLONOSCOPY  01/16/2021   multiple polyps   DILATION AND CURETTAGE OF UTERUS  2009   HYSTERECTOMY ABDOMINAL WITH SALPINGO-OOPHORECTOMY Left 06/24/2022   Procedure: TOTAL ABDOMINAL HYSTERECTOMY, LEFT SALPINGO-OOPHORECTOMY, RIGHT SALPINGECTOMY;  Surgeon: Johnnye Ade, MD;  Location: [facility];  Service: Gynecology;  Laterality: Left;   IR RADIOLOGIST EVAL & MGMT  12/05/2020   IR RADIOLOGIST EVAL & MGMT  05/20/2023   LAPAROTOMY  2004   ovarian cyst   MYOMECTOMY  05/16/2011   Procedure: MYOMECTOMY;  Surgeon: Ade CHRISTELLA Johnnye;  Location: [facility];  Service: Gynecology;  Laterality: N/A;  abdominal   RADIOLOGY WITH ANESTHESIA N/A 02/18/2013   Procedure: RADIOLOGY WITH ANESTHESIA;  Surgeon: Thyra MARLA Nash, MD;  Location: [facility];  Service: Radiology;  Laterality: N/A;   RADIOLOGY WITH ANESTHESIA N/A 01/26/2014   Procedure: RADIOLOGY WITH ANESTHESIA;  Surgeon: Thyra MARLA Nash, MD;  Location: [facility];  Service: Radiology;  Laterality: N/A;   RADIOLOGY WITH ANESTHESIA N/A 07/25/2014   Procedure: RADIOLOGY WITH ANESTHESIA;  Surgeon: Thyra MARLA Nash, MD;  Location: [facility];  Service: Radiology;  Laterality: N/A;   Social History[2] Family History  Problem Relation Age of Onset   Heart disease Mother 106       cad   Lupus Mother    COPD Mother    Arthritis Mother        rheumatoid   AVM Mother    Diabetes Mother    Bipolar disorder Mother    Drug abuse Mother    Fibromyalgia Mother    Colon polyps Mother    Diabetes Father    Cancer Father        thyroid , lung   Depression Sister    Diabetes Sister    Hyperlipidemia Sister    Hypertension Sister    Cancer Paternal Grandfather        colon   Colon cancer Paternal Grandfather    Alcohol abuse Paternal Uncle    Drug abuse Maternal Grandfather    Alcohol abuse Maternal Grandfather    Esophageal cancer Neg Hx    Stomach cancer Neg Hx    Rectal cancer Neg Hx    Allergies[3]  Medications: Patient's Medications  New Prescriptions   AMOXICILLIN -CLAVULANATE (AUGMENTIN ) 500-125 MG TABLET    Take 1 tablet by mouth in the morning and at bedtime.  Previous Medications   AMLODIPINE  (NORVASC ) 10 MG TABLET    Take 1 tablet (10 mg total) by mouth daily.   ATORVASTATIN  (LIPITOR ) 80 MG TABLET    Take 1 tablet (80 mg total) by mouth daily.   BACLOFEN (LIORESAL) 10 MG TABLET    Take 10 mg by mouth 3 (three) times daily as needed.   CLONAZEPAM  (KLONOPIN ) 0.5 MG TABLET    Take 0.5-1 mg by mouth 2 (two) times daily as needed. 0.5mg  in the mornnig, 1mg  in the evening   CYCLOBENZAPRINE  (FLEXERIL ) 10 MG TABLET    Take 1 tablet (10 mg total) by mouth 2 (two) times daily as needed for muscle spasms.   DULOXETINE  (CYMBALTA ) 60 MG CAPSULE    Take 120 mg by mouth daily.   ESOMEPRAZOLE MAGNESIUM  (NEXIUM PO)    Take by mouth.   FENOFIBRATE  (TRICOR ) 145 MG TABLET    Take 1 tablet (145 mg total) by mouth daily.   FEXOFENADINE (ALLEGRA) 60 MG TABLET    Take 60 mg by mouth daily.   FLUOCINOLONE ACETONIDE SCALP 0.01 % OIL    Apply topically as needed.   FLUTICASONE  (FLONASE ) 50 MCG/ACT NASAL  SPRAY    Place 2 sprays into both nostrils daily.   FUROSEMIDE  (LASIX ) 20 MG TABLET    Take 1 tablet (20 mg total) by mouth daily as needed.   KETOCONAZOLE (NIZORAL) 2 % SHAMPOO    Apply topically once a week.   LIDOCAINE  (LIDODERM ) 5 %    Place 1 patch onto the skin daily. Remove & Discard patch within 12 hours or as directed by MD   MULTIPLE VITAMIN (MULTIVITAMIN WITH MINERALS) TABS TABLET    Take 1 tablet by mouth daily.   OMEGA-3 FATTY ACIDS (FISH OIL PO)    Take 1 capsule by mouth daily. De3 3000 mg   PROCHLORPERAZINE (COMPAZINE) 10 MG TABLET    Take 1 tablet by mouth every 8 (eight) hours as needed.   PROPRANOLOL  ER (INDERAL  LA) 120 MG 24 HR CAPSULE    Take 1 capsule (120 mg total) by mouth daily.   TIRZEPATIDE  (ZEPBOUND ) 5 MG/0.5ML PEN    Inject 5 mg into the skin once a week.   TRAZODONE  (DESYREL ) 50 MG TABLET    Take 100 mg by mouth at bedtime. Takes 2   TRIAMCINOLONE  (KENALOG ) 0.1 %    Apply topically 2 (two) times daily.   VITAMIN B-12 (CYANOCOBALAMIN ) 100 MCG TABLET    Take 100 mcg by mouth daily. Pt unsure of dosage.  Modified Medications   No medications on file  Discontinued Medications   No medications on file    Physical Exam: Vitals:   09/13/24 1327  BP: 128/70  Pulse: 75  Temp: 97.8 F (36.6 C)  TempSrc: Oral  SpO2: 99%  Weight: 203 lb (92.1 kg)   Body mass index is 33.27 kg/m. BP Readings from Last 3 Encounters:  09/13/24 128/70  06/23/24 (!) 126/96  03/19/24 108/77   Wt Readings from Last 3 Encounters:  09/13/24 203 lb (92.1 kg)  03/19/24 237 lb (107.5 kg)  02/16/24 255 lb (115.7 kg)    Physical Exam Constitutional:      Appearance: Normal appearance.  HENT:     Head: Normocephalic and atraumatic.     Right Ear: Tympanic membrane normal.     Left Ear: Tympanic  membrane normal.     Mouth/Throat:     Pharynx: Posterior oropharyngeal erythema present. No oropharyngeal exudate.     Comments: Ethmoid, maxillary sinus tenderness+ Cardiovascular:      Rate and Rhythm: Normal rate and regular rhythm.  Pulmonary:     Effort: Pulmonary effort is normal. No respiratory distress.     Breath sounds: Normal breath sounds. No wheezing.  Abdominal:     General: Bowel sounds are normal. There is no distension.     Tenderness: There is no abdominal tenderness. There is no guarding or rebound.     Comments:    Musculoskeletal:        General: No swelling or tenderness.  Neurological:     Mental Status: She is alert. Mental status is at baseline.     Sensory: No sensory deficit.     Motor: No weakness.     Labs reviewed: Basic Metabolic Panel: Recent Labs    10/08/23 1408  NA 137  K 3.7  CL 98  CO2 30  GLUCOSE 75  BUN 12  CREATININE 1.11  CALCIUM  9.7  TSH 2.15   Liver Function Tests: Recent Labs    10/08/23 1408  AST 27  ALT 35  ALKPHOS 38*  BILITOT 0.7  PROT 7.0  ALBUMIN 4.5   No results for input(s): LIPASE, AMYLASE in the last 8760 hours. No results for input(s): AMMONIA in the last 8760 hours. CBC: Recent Labs    10/08/23 1408  WBC 6.9  HGB 12.9  HCT 39.3  MCV 97.4  PLT 346.0   Lipid Panel: Recent Labs    10/08/23 1408  CHOL 173  HDL 63.70  LDLCALC 92  TRIG 85.0  CHOLHDL 3   TSH: Recent Labs    10/08/23 1408  TSH 2.15   A1C: Lab Results  Component Value Date   HGBA1C 5.0 03/19/2024   HGBA1C 5.0 03/19/2024   HGBA1C 5.0 (A) 03/19/2024   HGBA1C 5.0 03/19/2024    Assessment & Plan Acute cough Lungs clear  O2 sat 100% on RA Instructed to take robitussin q6 prn    Dysuria Pt c/o dysuria since 2-3 days Afebrile Urine dip stick neg Orders:   POCT Urinalysis Dipstick (Automated)  Acute non-recurrent maxillary sinusitis Nasal congestion since 2 weeks Maxillary sinus tenderness+ Will send augmentin  to pharmacy Orders:   amoxicillin -clavulanate (AUGMENTIN ) 500-125 MG tablet; Take 1 tablet by mouth in the morning and at bedtime.  Other fatigue  Orders:   CBC with  Differential/Platelet   Basic Metabolic Panel (BMET)   POCT Urinalysis Dipstick (Automated)   No follow-ups on file.:   Dewain Platz     [1]  Allergies Allergen Reactions   Ace Inhibitors Swelling and Other (See Comments)    Angioedema    Morphine  Itching  [2]  Social History Tobacco Use   Smoking status: Former    Current packs/day: 0.00    Types: Cigarettes    Start date: 2020    Quit date: 2022    Years since quitting: 4.0   Smokeless tobacco: Never   Tobacco comments:    Only smoked occasionally for about 2 years.  Vaping Use   Vaping status: Never Used  Substance Use Topics   Alcohol use: Yes    Alcohol/week: 5.0 standard drinks of alcohol    Types: 5 Glasses of wine per week   Drug use: No  [3]  Allergies Allergen Reactions   Ace Inhibitors Swelling and Other (See Comments)  Angioedema    Morphine  Itching   "

## 2024-09-13 NOTE — Telephone Encounter (Signed)
 FYI Only or Action Required?: FYI only for provider: appointment scheduled on 1/12.  Patient was last seen in primary care on 03/19/2024 by Catherine Fuller A, DO.  Called Nurse Triage reporting Cough.  Symptoms began a week ago.  Interventions attempted: OTC medications: Coricedin and Rest, hydration, or home remedies.  Symptoms are: gradually worsening.  Triage Disposition: See Physician Within 24 Hours  Patient/caregiver understands and will follow disposition?: Yes  Message from Woolrich F sent at 09/13/2024  8:34 AM EST  Reason for Triage: Patient feels like she had the flu for 10 days - she has cough, slight sore throat, congestion, and achy. Wants to know if she needs medication or appointment. Please call her at [phone]   Reason for Disposition  [1] Continuous (nonstop) coughing interferes with work or school AND [2] no improvement using cough treatment per Care Advice  Answer Assessment - Initial Assessment Questions 10 days of symptoms- productive cough, low grade fever, sneezing, sore throat (red no white patches), body aches, sinus congestion. Chest tight when coughing. Denies SOB, Dizzy, CP. Fever resolved.   Hadnt tested for flu , covid was negative Coricedin- to not increase BP (day and night formulas)   Appt with pcp office today to assess. Ed/uc precautions understood.    1. ONSET: When did the cough begin?      10 days  2. SEVERITY: How bad is the cough today?      moderate 3. SPUTUM: Describe the color of your sputum (e.g., none, dry cough; clear, white, yellow, green)     Yellow, green, white 4. HEMOPTYSIS: Are you coughing up any blood? If Yes, ask: How much? (e.g., flecks, streaks, tablespoons, etc.)     Denies  5. DIFFICULTY BREATHING: Are you having difficulty breathing? If Yes, ask: How bad is it? (e.g., mild, moderate, severe)      Denies  6. FEVER: Do you have a fever? If Yes, ask: What is your temperature, how was it measured, and  when did it start?     For a few days- low grade  7. CARDIAC HISTORY: Do you have any history of heart disease? (e.g., heart attack, congestive heart failure)      Htn, ACA aneurysm  8. LUNG HISTORY: Do you have any history of lung disease?  (e.g., pulmonary embolus, asthma, emphysema)     denies 9. PE RISK FACTORS: Do you have a history of blood clots? (or: recent major surgery, recent prolonged travel, bedridden)     Denies  10. OTHER SYMPTOMS: Do you have any other symptoms? (e.g., runny nose, wheezing, chest pain)       Fatigue, sore throat (red-no white patches  Protocols used: Cough - Acute Productive-A-AH

## 2024-09-14 ENCOUNTER — Ambulatory Visit: Payer: Self-pay | Admitting: Sports Medicine

## 2024-09-14 ENCOUNTER — Ambulatory Visit: Admitting: Family Medicine

## 2024-10-13 ENCOUNTER — Encounter: Admitting: Family Medicine
